# Patient Record
Sex: Female | Born: 1945 | Hispanic: Refuse to answer | Smoking: Never smoker
Health system: Southern US, Community
[De-identification: ages and names within clinical notes are randomized; demographics above are authoritative.]

## PROBLEM LIST (undated history)

## (undated) DIAGNOSIS — I10 Essential (primary) hypertension: Secondary | ICD-10-CM

## (undated) DIAGNOSIS — E559 Vitamin D deficiency, unspecified: Secondary | ICD-10-CM

## (undated) DIAGNOSIS — J9610 Chronic respiratory failure, unspecified whether with hypoxia or hypercapnia: Secondary | ICD-10-CM

## (undated) DIAGNOSIS — G35D Multiple sclerosis, unspecified: Secondary | ICD-10-CM

## (undated) DIAGNOSIS — K219 Gastro-esophageal reflux disease without esophagitis: Secondary | ICD-10-CM

## (undated) DIAGNOSIS — N182 Chronic kidney disease, stage 2 (mild): Secondary | ICD-10-CM

## (undated) DIAGNOSIS — E119 Type 2 diabetes mellitus without complications: Secondary | ICD-10-CM

## (undated) DIAGNOSIS — R1312 Dysphagia, oropharyngeal phase: Secondary | ICD-10-CM

## (undated) DIAGNOSIS — M8580 Other specified disorders of bone density and structure, unspecified site: Secondary | ICD-10-CM

## (undated) DIAGNOSIS — E785 Hyperlipidemia, unspecified: Secondary | ICD-10-CM

## (undated) DIAGNOSIS — G8929 Other chronic pain: Secondary | ICD-10-CM

## (undated) DIAGNOSIS — Z8719 Personal history of other diseases of the digestive system: Secondary | ICD-10-CM

## (undated) DIAGNOSIS — K760 Fatty (change of) liver, not elsewhere classified: Secondary | ICD-10-CM

## (undated) DIAGNOSIS — G8320 Monoplegia of upper limb affecting unspecified side: Secondary | ICD-10-CM

## (undated) DIAGNOSIS — E210 Primary hyperparathyroidism: Secondary | ICD-10-CM

## (undated) DIAGNOSIS — E669 Obesity, unspecified: Secondary | ICD-10-CM

## (undated) DIAGNOSIS — G35 Multiple sclerosis: Secondary | ICD-10-CM

## (undated) HISTORY — DX: Other chronic pain: G89.29

## (undated) HISTORY — DX: Essential (primary) hypertension: I10

## (undated) HISTORY — DX: Multiple sclerosis, unspecified: G35.D

## (undated) HISTORY — PX: ANTERIOR CRUCIATE LIGAMENT REPAIR: SHX115

## (undated) HISTORY — DX: Personal history of other diseases of the digestive system: Z87.19

## (undated) HISTORY — DX: Hyperlipidemia, unspecified: E78.5

## (undated) HISTORY — DX: Type 2 diabetes mellitus without complications: E11.9

## (undated) HISTORY — DX: Chronic kidney disease, stage 2 (mild): N18.2

## (undated) HISTORY — DX: Fatty (change of) liver, not elsewhere classified: K76.0

## (undated) HISTORY — DX: Vitamin D deficiency, unspecified: E55.9

## (undated) HISTORY — DX: Primary hyperparathyroidism: E21.0

## (undated) HISTORY — DX: Multiple sclerosis: G35

## (undated) HISTORY — DX: Other specified disorders of bone density and structure, unspecified site: M85.80

## (undated) HISTORY — DX: Gastro-esophageal reflux disease without esophagitis: K21.9

## (undated) HISTORY — DX: Obesity, unspecified: E66.9

## (undated) HISTORY — PX: VESICOVAGINAL FISTULA CLOSURE W/ TAH: SUR271

---

## 1999-11-08 ENCOUNTER — Other Ambulatory Visit: Admission: RE | Admit: 1999-11-08 | Discharge: 1999-11-08 | Payer: Self-pay | Admitting: Obstetrics and Gynecology

## 1999-11-22 ENCOUNTER — Encounter: Payer: Self-pay | Admitting: Obstetrics and Gynecology

## 1999-11-22 ENCOUNTER — Encounter: Admission: RE | Admit: 1999-11-22 | Discharge: 1999-11-22 | Payer: Self-pay | Admitting: Obstetrics and Gynecology

## 2000-02-16 ENCOUNTER — Ambulatory Visit (HOSPITAL_BASED_OUTPATIENT_CLINIC_OR_DEPARTMENT_OTHER): Admission: RE | Admit: 2000-02-16 | Discharge: 2000-02-17 | Payer: Self-pay | Admitting: Orthopedic Surgery

## 2000-11-08 ENCOUNTER — Other Ambulatory Visit: Admission: RE | Admit: 2000-11-08 | Discharge: 2000-11-08 | Payer: Self-pay | Admitting: Obstetrics and Gynecology

## 2000-11-24 ENCOUNTER — Encounter: Admission: RE | Admit: 2000-11-24 | Discharge: 2000-11-24 | Payer: Self-pay | Admitting: Obstetrics and Gynecology

## 2000-11-24 ENCOUNTER — Encounter: Payer: Self-pay | Admitting: Obstetrics and Gynecology

## 2001-11-09 ENCOUNTER — Other Ambulatory Visit: Admission: RE | Admit: 2001-11-09 | Discharge: 2001-11-09 | Payer: Self-pay | Admitting: Obstetrics and Gynecology

## 2001-11-26 ENCOUNTER — Encounter: Admission: RE | Admit: 2001-11-26 | Discharge: 2001-11-26 | Payer: Self-pay | Admitting: Obstetrics and Gynecology

## 2001-11-26 ENCOUNTER — Encounter: Payer: Self-pay | Admitting: Obstetrics and Gynecology

## 2002-12-13 ENCOUNTER — Encounter: Admission: RE | Admit: 2002-12-13 | Discharge: 2002-12-13 | Payer: Self-pay | Admitting: Obstetrics and Gynecology

## 2002-12-13 ENCOUNTER — Encounter: Payer: Self-pay | Admitting: Obstetrics and Gynecology

## 2003-04-17 ENCOUNTER — Ambulatory Visit (HOSPITAL_COMMUNITY): Admission: RE | Admit: 2003-04-17 | Discharge: 2003-04-17 | Payer: Self-pay | Admitting: *Deleted

## 2006-10-13 ENCOUNTER — Encounter: Admission: RE | Admit: 2006-10-13 | Discharge: 2006-10-13 | Payer: Self-pay | Admitting: Obstetrics and Gynecology

## 2007-12-20 ENCOUNTER — Encounter: Payer: Self-pay | Admitting: Endocrinology

## 2007-12-31 ENCOUNTER — Encounter: Payer: Self-pay | Admitting: Endocrinology

## 2008-01-31 DIAGNOSIS — I10 Essential (primary) hypertension: Secondary | ICD-10-CM

## 2008-01-31 DIAGNOSIS — H469 Unspecified optic neuritis: Secondary | ICD-10-CM

## 2008-01-31 DIAGNOSIS — E785 Hyperlipidemia, unspecified: Secondary | ICD-10-CM

## 2008-01-31 DIAGNOSIS — G35D Multiple sclerosis, unspecified: Secondary | ICD-10-CM | POA: Insufficient documentation

## 2008-01-31 DIAGNOSIS — E11649 Type 2 diabetes mellitus with hypoglycemia without coma: Secondary | ICD-10-CM | POA: Insufficient documentation

## 2008-01-31 DIAGNOSIS — E1140 Type 2 diabetes mellitus with diabetic neuropathy, unspecified: Secondary | ICD-10-CM

## 2008-01-31 DIAGNOSIS — K219 Gastro-esophageal reflux disease without esophagitis: Secondary | ICD-10-CM

## 2008-01-31 DIAGNOSIS — G35 Multiple sclerosis: Secondary | ICD-10-CM | POA: Insufficient documentation

## 2008-02-01 ENCOUNTER — Ambulatory Visit: Payer: Self-pay | Admitting: Endocrinology

## 2008-02-01 DIAGNOSIS — E210 Primary hyperparathyroidism: Secondary | ICD-10-CM

## 2008-02-01 DIAGNOSIS — M810 Age-related osteoporosis without current pathological fracture: Secondary | ICD-10-CM | POA: Insufficient documentation

## 2008-11-24 ENCOUNTER — Encounter: Payer: Self-pay | Admitting: Endocrinology

## 2008-12-16 ENCOUNTER — Telehealth (INDEPENDENT_AMBULATORY_CARE_PROVIDER_SITE_OTHER): Payer: Self-pay | Admitting: *Deleted

## 2009-10-07 ENCOUNTER — Ambulatory Visit (HOSPITAL_COMMUNITY): Admission: RE | Admit: 2009-10-07 | Discharge: 2009-10-07 | Payer: Self-pay | Admitting: Internal Medicine

## 2009-11-23 ENCOUNTER — Ambulatory Visit (HOSPITAL_COMMUNITY): Admission: RE | Admit: 2009-11-23 | Discharge: 2009-11-23 | Payer: Self-pay | Admitting: Surgery

## 2010-12-23 LAB — DIFFERENTIAL
Basophils Absolute: 0.1 10*3/uL (ref 0.0–0.1)
Basophils Relative: 1 % (ref 0–1)
Lymphocytes Relative: 23 % (ref 12–46)
Neutro Abs: 6.9 10*3/uL (ref 1.7–7.7)
Neutrophils Relative %: 72 % (ref 43–77)

## 2010-12-23 LAB — CBC
MCHC: 34.6 g/dL (ref 30.0–36.0)
RBC: 4.89 MIL/uL (ref 3.87–5.11)
RDW: 12.6 % (ref 11.5–15.5)

## 2010-12-23 LAB — PROTIME-INR: INR: 0.94 (ref 0.00–1.49)

## 2010-12-23 LAB — URINALYSIS, ROUTINE W REFLEX MICROSCOPIC
Hgb urine dipstick: NEGATIVE
Nitrite: NEGATIVE
Protein, ur: NEGATIVE mg/dL
Specific Gravity, Urine: 1.013 (ref 1.005–1.030)
Urobilinogen, UA: 0.2 mg/dL (ref 0.0–1.0)

## 2010-12-23 LAB — BASIC METABOLIC PANEL
CO2: 29 mEq/L (ref 19–32)
Calcium: 12.1 mg/dL — ABNORMAL HIGH (ref 8.4–10.5)
Creatinine, Ser: 0.83 mg/dL (ref 0.4–1.2)
GFR calc Af Amer: >60 mL/min (ref >60)
GFR calc non Af Amer: >60 mL/min (ref >60)
Glucose, Bld: 196 mg/dL — ABNORMAL HIGH (ref 70–99)

## 2010-12-23 LAB — GLUCOSE, CAPILLARY

## 2011-02-18 NOTE — Op Note (Signed)
[place]  Patient:    Janice Little, Janice Little                   MRN: [id number] Proc. Date: 02/16/00 Adm. Date:  [id number] Disc. Date: [id number] Attending:  Milly Jakob                           Operative Report  PREOPERATIVE DIAGNOSIS:  Status post anterior cruciate ligament and medial collateral ligament injury with questionable meniscal tears, medial and lateral.  POSTOPERATIVE DIAGNOSIS:  Status post anterior cruciate ligament and medial collateral ligament injury with questionable meniscal tears, medial and lateral.  PRINCIPAL PROCEDURE: 1. Anterior cruciate ligament reconstruction with allograft tendon. 2. Removal of loose body. 3. Treatment of posterolateral meniscal tear without resection.  SURGEON:  Harvie Junior, M.D.  ASSISTANT:  Kerby Less, P.A.  ANESTHESIA:  General.  BRIEF HISTORY:  The patient is a 65 year old female with a long history of having had an ACL and MCL injury.  It was unclear to her after much more discussion as to whether the ACL possibly was an old injury.  She was ultimately treated in a range of motion brace for a month and we discussed at length nonoperative care for this injury.  In fact, had started her on quadriceps strengthening exercises as part of a nonoperative program. Ultimately, the patient decided that she did want to have her ACL reconstruction.  Again, we discussed the issues with ACL reconstruction, given that she did not have any ambitions as getting back into athletics or other aggressive activity.  She was feeling that she was unstable as she was walking around in her T-round brace, and because of this, she did want to have her reconstruction.  We talked about the possibilities of allograft autograft.  I did not think that hamstring was a good option for her, given that she had a medial side injury.  She ultimately selected on an allograft which I think was an appropriate choice  given her age and activity demands, and she was brought to the operating room for this procedure.  A preoperative MRI showed that she had the ACL tear, as well as concerned there were both medial and lateral meniscal tear.  She was taken to the operating room at this point.  DESCRIPTION OF PROCEDURE:  The patient was taken to the operating room where after adequate anesthesia was obtained with general anesthetic, the patient was placed supine on the operating table.  The left leg was then prepped and draped in the usual sterile fashion.  Following this, routine arthroscopic examination of the knee revealed that there was mild under surface tear on the medial meniscus.  It was certainly stable.  Did not feel that it appeared to want to pull out into the knee joint at all, and there was no significant osteochondral injury on the end of this femoral side.  Given the stable nature of the meniscus and the good appearance of the femoral condyle, it was felt that benign treatment of this was the most appropriate, and this was undertaken.  Following this, attention was turned to the midportion where a large ostial cartilaginous loose body was identified.  This was removed with a straight biting forceps.  Following this, attention was turned laterally where there was noted to be about a centimeter-and-a-half lateral meniscal tear that extended obviously to the superior and inferior surfaces.  Probing of this did  show that it could be distracted, but not out all the way underneath the condyle.  A large amount of thought was given to the most appropriate course of action here.  The lateral femoral condyle was without abnormality.  There was no chondromalacia.  It was felt at this point that the most appropriate course of action would be to stabilize the knee, and hopefully this would allow the peripheral lateral meniscal tear to heal, and this was undertaken.  At this point, the area was  probed.  It was minimally debrided with a suction shaver and then the meniscal synovial was abraded above it, and then attention was turned to the notch.  A dramatic amount of stenosis had occurred in the notch and it was clear at this point that this was a chronic ACL injury, not an acute, and a large notchplasty needed to be performed.  This was performed with the motorized bur and a suction shaver, allowing excellent lateral clearance and superior clearance.  Following this, ______ notchplasty, the Arthrex tibial guide was advanced into the side and the guide pin was used to enter into the knee.  A 10 mm reamer was then over-reamed, one through the subcutaneous tissue and taken down to level of bone.  Once the 10 mm reamer was used, a rasp was used to smooth the posterior surface, and then a 9 mm reamer was put in position.  The knee was brought through a full range of motion with the reamer up in the position of where the graft would go, and no evidence of impingement superiorly or laterally.  Attention was then turned to the posterior femoral condyle where a 6.5 mm over the top guide was used and the Beath needle was advanced through the knee out the distal lateral femur.  Following this, a footprint was made.  There was a good back wall and then the tunnel was drilled to 30 mm.  A 25 mm bone plug was noted to be in position superiorly.  Following this, a 7 x 25 mm screw was advanced into place with a protective guide and then the graft was tugged as much as possible, and no evidence of instability was noticed.  The ACL worked very nicely around the PCL at this point.  Attention was now turned towards locking the graft and distally, it was a perfect length distally, right at the edge of the tunnel, and an 8 x 20 mm interference screw was put in distally to lock the graft in place.  The graft was probed and had excellent tightness in full extension and excellent feel throughout  the range of motion.  Prior to locking it in distally, the knee was put through a range of motion and the graft was isometric.   At this point, the knee was copiously irrigated and suctioned dry.  A final check was made both medially and laterally, and all excess bony remnants were removed.  No other loose bodies were identified, and at this point, the tourniquet was let down.  The small opening for the graft was closed with a combination of 0 and 2-0 Vicryl, and the skin with a 3-0 Prolene suture.  A sterile and compressive dressing was applied after Steri-Strips had been applied to the superior opening, and the patient was then taken to the recovery room where she was noted to be satisfactory condition.  Estimated blood loss for the procedure was none. DD:  02/16/00 TD:  02/21/00 Job: [id number]

## 2014-04-18 ENCOUNTER — Encounter: Payer: Self-pay | Admitting: *Deleted

## 2014-04-18 ENCOUNTER — Other Ambulatory Visit: Payer: Self-pay | Admitting: Family Medicine

## 2014-04-18 DIAGNOSIS — R7989 Other specified abnormal findings of blood chemistry: Secondary | ICD-10-CM

## 2014-04-18 DIAGNOSIS — R945 Abnormal results of liver function studies: Principal | ICD-10-CM

## 2014-04-25 ENCOUNTER — Other Ambulatory Visit: Payer: Self-pay

## 2014-04-30 ENCOUNTER — Ambulatory Visit
Admission: RE | Admit: 2014-04-30 | Discharge: 2014-04-30 | Disposition: A | Discharge status: Home / Self Care | Payer: Medicare Other | Source: Ambulatory Visit | Attending: Family Medicine | Admitting: Family Medicine

## 2014-04-30 DIAGNOSIS — R945 Abnormal results of liver function studies: Principal | ICD-10-CM

## 2014-04-30 DIAGNOSIS — R7989 Other specified abnormal findings of blood chemistry: Secondary | ICD-10-CM

## 2014-05-19 ENCOUNTER — Other Ambulatory Visit: Payer: Self-pay | Admitting: Gastroenterology

## 2014-05-19 DIAGNOSIS — R748 Abnormal levels of other serum enzymes: Secondary | ICD-10-CM

## 2014-05-24 ENCOUNTER — Other Ambulatory Visit: Payer: Medicare Other

## 2014-05-28 ENCOUNTER — Ambulatory Visit
Admission: RE | Admit: 2014-05-28 | Discharge: 2014-05-28 | Disposition: A | Discharge status: Home / Self Care | Payer: Medicare Other | Source: Ambulatory Visit | Attending: Gastroenterology | Admitting: Gastroenterology

## 2014-05-28 DIAGNOSIS — R748 Abnormal levels of other serum enzymes: Secondary | ICD-10-CM

## 2014-05-28 MED ORDER — GADOBENATE DIMEGLUMINE 529 MG/ML IV SOLN
20.0000 mL | Freq: Once | INTRAVENOUS | Status: AC | PRN
  Administered 2014-05-28: 20 mL via INTRAVENOUS

## 2015-04-02 ENCOUNTER — Ambulatory Visit (INDEPENDENT_AMBULATORY_CARE_PROVIDER_SITE_OTHER): Payer: Medicare Other | Admitting: Neurology

## 2015-04-02 ENCOUNTER — Encounter: Payer: Self-pay | Admitting: Neurology

## 2015-04-02 ENCOUNTER — Other Ambulatory Visit: Payer: Self-pay | Admitting: Neurology

## 2015-04-02 VITALS — BP 156/72 | HR 76 | Resp 16 | Ht 65.0 in | Wt 197.2 lb

## 2015-04-02 DIAGNOSIS — R200 Anesthesia of skin: Secondary | ICD-10-CM | POA: Diagnosis not present

## 2015-04-02 DIAGNOSIS — G5711 Meralgia paresthetica, right lower limb: Secondary | ICD-10-CM

## 2015-04-02 DIAGNOSIS — N39498 Other specified urinary incontinence: Secondary | ICD-10-CM | POA: Diagnosis not present

## 2015-04-02 DIAGNOSIS — G250 Essential tremor: Secondary | ICD-10-CM | POA: Diagnosis not present

## 2015-04-02 DIAGNOSIS — Z8669 Personal history of other diseases of the nervous system and sense organs: Secondary | ICD-10-CM | POA: Diagnosis not present

## 2015-04-02 DIAGNOSIS — R5383 Other fatigue: Secondary | ICD-10-CM | POA: Insufficient documentation

## 2015-04-02 DIAGNOSIS — H532 Diplopia: Secondary | ICD-10-CM

## 2015-04-02 DIAGNOSIS — R260 Ataxic gait: Secondary | ICD-10-CM | POA: Diagnosis not present

## 2015-04-02 DIAGNOSIS — R32 Unspecified urinary incontinence: Secondary | ICD-10-CM | POA: Insufficient documentation

## 2015-04-02 MED ORDER — TAMSULOSIN HCL 0.4 MG PO CAPS: 0.4000 mg | ORAL_CAPSULE | Freq: Every day | ORAL | Status: DC

## 2015-04-02 NOTE — Progress Notes (Signed)
[facility]  PATIENT: Janice Little DOB: 09-12-1946  REFERRING DOCTOR OR PCP:  Juluis Rainier  SOURCE: Patient and records from Dr. Zachery Dauer.  _________________________________   HISTORICAL  CHIEF COMPLAINT:  Chief Complaint  Patient presents with  . Multiple Sclerosis      Sts. she was dx. with MS in 1986, by her pcp at the time, Dr. Alleen Borne.  Sts. presenting sx. was optic neuritis (right eye).  Sts. she was referred to Dr. Sandria Manly here at [facility] and he rx'd oral steroids.  Sts. Dr. Sandria Manly told her he was not sure she had MS.  Sts. she had mult. mri's but they never showed any lesions.  After the initial optic neuritis resolved, she sts. she was ok until 1994, when she presented with generalized numbness/pain/fatigue,  Sts. she again saw Dr. Sandria Manly and was dx. with   . Numbness    transverse myelitis.  Sts. Dr. Sandria Manly did not rx. any tx. for transverse myelitis, and she just continued with 6 mo. follow ups and occasional mri.  Sts. she has continued to have intermiitent numbness to mid-lower back.  She has new c/o numbness/pain right thigh, onset 4 mos. ago. Occasional pain left foot radiating up left leg to knee./fim    HISTORY OF PRESENT ILLNESS:  I had the pleasure seeing your patient, Janice Little, at [facility] for neurologic consultation regarding her multiple sclerosis.  MS History:  In 1986, she presented with right optic neuritis and was referred to Dr. love. Place her on oral steroids. She had an MRI of the brain that was normal at that time. 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 However, the MRI did not show any definite changes. Imaging studies were probably about 14 years ago and she last saw Dr. love around 2002.  Numbness:   About 4 months ago, she had the onset of numbness in the right anterolateral thigh. She week the numbness became more painful.  She notes the pain in the skin only and not deeper. There is often an itching sensation there or a sensation of something moving symptoms are now fairly stable and she feels the same now as one month ago.   She notes losing about 10 pounds this year.     Gait: She has had a fluctuating mild gait disturbance. Earlier this week she had some decreased balance and left leg pain. She uses a cane for day. Today she feels back to her baseline.  Bladder/bowel: Around 2000 she had the onset of urinary incontinence and is incontinent on a frequent basis. She notes that she often has trouble initiating her urine flow she has had urinary tract infections but none in the past couple of years. She notes much more rare bowel incontinence.   Vision: She felt that her vision got 100% better after her initial optic neuritis. 3 years ago, she began to have diplopia with convergence and prism reading glasses were made for her with benefit.  Fatigue/sleep: She notes fatigue most of the time. She will occasionally have periods of time lasting couple days to a week or so where she has less fatigue but these are not common. She feels that she falls asleep and stays asleep well. She does not know if she snores but she has never been told that she does nor has she been told that she has pauses or gasping at night.   However, she  does snore if she has a upper respiratory infection.  Mood/cognition: She denies any depression or anxiety. Back in 2011, she noted some cognitive fog but that improved after she was diagnosis with hyperparathyroidism and had a parathyroidectomy. Calcium levels had been elevated at the time.  Tremor:  She notes a low amplitude fast tremor in the right hand that occurs mostly when she is holding a cup of coffee.  REVIEW OF SYSTEMS: Constitutional: No fevers, chills, sweats, or change in appetite.   Notes fatigue Eyes: No   eye pain   See above. Ear, nose and throat: No hearing loss, ear pain, nasal  congestion, sore throat Cardiovascular: No chest pain, palpitations Respiratory: No shortness of breath at rest or with exertion.   No wheezes GastrointestinaI: No nausea, vomiting, diarrhea, abdominal pain, fecal incontinence Genitourinary: as above Musculoskeletal: No neck pain, back pain Integumentary: No rash, pruritus, skin lesions Neurological: as above Psychiatric: No depression at this time.  No anxiety Endocrine: No palpitations, diaphoresis, change in appetite, change in weigh or increased thirst Hematologic/Lymphatic: No anemia, purpura, petechiae. Allergic/Immunologic: No itchy/runny eyes, nasal congestion, recent allergic reactions, rashes  ALLERGIES: No Known Allergies  HOME MEDICATIONS:  Current outpatient prescriptions:  .  amLODipine (NORVASC) 10 MG tablet, Take 10 mg by mouth daily., Disp: , Rfl:  .  atorvastatin (LIPITOR) 20 MG tablet, Take 20 mg by mouth daily., Disp: , Rfl:  .  Cholecalciferol (VITAMIN D PO), Take 5,000 Units by mouth daily., Disp: , Rfl:  .  estradiol (ESTRACE) 0.5 MG tablet, Take 0.5 mg by mouth daily., Disp: , Rfl:  .  loratadine (CLARITIN) 10 MG tablet, Take 10 mg by mouth daily., Disp: , Rfl:  .  metFORMIN (GLUMETZA) 500 MG (MOD) 24 hr tablet, Take 500 mg by mouth 2 (two) times daily with a meal., Disp: , Rfl:  .  Multiple Vitamin (MULTIVITAMIN) capsule, Take 1 capsule by mouth daily., Disp: , Rfl:  .  omeprazole (PRILOSEC OTC) 20 MG tablet, Take 20 mg by mouth daily., Disp: , Rfl:  .  ergocalciferol (VITAMIN D2) 50000 UNITS capsule, Take 50,000 Units by mouth once a week., Disp: , Rfl:  .  JANUVIA 50 MG tablet, , Disp: , Rfl:   PAST MEDICAL HISTORY: Past Medical History  Diagnosis Date  . GERD (gastroesophageal reflux disease)   . DM type 2 (diabetes mellitus, type 2)   . HTN (hypertension)   . Obesity   . Hyperlipidemia   . Vitamin D deficiency   . Chronic pain   . History of hepatitis as a child     age 71 (jaundice)  . MS  (multiple sclerosis)   . Fatty liver     per CT  . Primary hyperparathyroidism     s/p surgery removal of parathyroid (Dr. Sharl Ma, Dr. Gerrit Friends)  . Osteopenia   . Chronic kidney disease (CKD), stage II (mild)     PAST SURGICAL HISTORY: Past Surgical History  Procedure Laterality Date  . Vesicovaginal fistula closure w/ tah    . Anterior cruciate ligament repair Left     FAMILY HISTORY: Family History  Problem Relation Age of Onset  . Mental illness Mother   . COPD Father   . Lung cancer Father     SOCIAL HISTORY:  History   Social History  . Marital Status: Legally Separated    Spouse Name: N/A  . Number of Children: N/A  . Years of Education: N/A   Occupational History  . Not on  file.   Social History Main Topics  . Smoking status: Never Smoker   . Smokeless tobacco: Not on file  . Alcohol Use: Not on file  . Drug Use: Not on file  . Sexual Activity: Not on file   Other Topics Concern  . Not on file   Social History Narrative     PHYSICAL EXAM  Filed Vitals:   04/02/15 1402  BP: 156/72  Pulse: 76  Resp: 16  Height: 5\' 5"  (1.651 m)  Weight: 197 lb 3.2 oz (89.449 kg)    Body mass index is 32.82 kg/(m^2).   General: The patient is well-developed and well-nourished and in no acute distress  Eyes:  Funduscopic exam shows normal optic discs and retinal vessels.  Neck: The neck is supple, no carotid bruits are noted.  The neck is nontender.  Cardiovascular: The heart has a regular rate and rhythm with a normal S1 and S2. There were no murmurs, gallops or rubs. Lungs are clear to auscultation.  Skin: Extremities are without significant edema.  Musculoskeletal:  Back is nontender  Neurologic Exam  Mental status: The patient is alert and oriented x 3 at the time of the examination. The patient has apparent normal recent and remote memory, with an apparently normal attention span and concentration ability.   Speech is normal.  Cranial nerves:  Extraocular movements are full. Pupils are equal, round, and reactive to light and accomodation.  Color vision symmetric.   Facial symmetry is present. There is good facial sensation to soft touch bilaterally.Facial strength is normal.  Trapezius and sternocleidomastoid strength is normal. No dysarthria is noted.  The tongue is midline, and the patient has symmetric elevation of the soft palate. No obvious hearing deficits are noted.  Motor:  Muscle bulk is normal.   Tone is normal. Strength is  5 / 5 in all 4 extremities.   Sensory: Sensory testing is intact to pinprick, soft touch and vibration sensation in arms and feet.   Decreased sensation over the left lateral femoral cutaneous nerve.  Coordination: Cerebellar testing reveals good finger-nose-finger and heel-to-shin bilaterally.  Gait and station: Station is normal.   Gait is mildly wide. Tandem gait is wide . Romberg is negative.   Reflexes: Deep tendon reflexes are symmetric and normal bilaterally.   Plantar responses are flexor.    DIAGNOSTIC DATA (LABS, IMAGING, TESTING) - I reviewed patient records, labs, notes, testing and imaging myself where available.      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 The diagnosis of MS was never completely clear in the  past as her symptoms history of optic neuritis were certainly consistent with the diagnosis but MRIs were normal eye believe the right leg numbness is due to lateral femoral cutaneous neuropathy and not  to a central issue. For, I am very concerned about some of her other symptoms including her gait ataxia and urinary incontinence. Therefore, we need to obtain an MRI of the brain and MRI of the cervical spine to assess for the possibility of multiple sclerosis and also to make sure that there is not another etiology of a myelopathy. Her urinary incontinence seems to be due to more to overflow issues rather than to a spastic bladder. Therefore, I will have her try Flomax to see if that can help. She will return to see me in 3 months or sooner if she has new or worsening neurologic symptoms. We will call with the results of the MRI a few days after it is performed.     Richard A. Epimenio Foot, MD, PhD 04/02/2015, 2:14 PM Certified in Neurology, Clinical Neurophysiology, Sleep Medicine, Pain Medicine and Neuroimaging  [facility] [address] [place], [place] [address] [phone]

## 2015-04-17 ENCOUNTER — Ambulatory Visit
Admission: RE | Admit: 2015-04-17 | Discharge: 2015-04-17 | Disposition: A | Discharge status: Home / Self Care | Payer: Medicare Other | Source: Ambulatory Visit | Attending: Neurology | Admitting: Neurology

## 2015-04-17 DIAGNOSIS — G35 Multiple sclerosis: Secondary | ICD-10-CM | POA: Diagnosis not present

## 2015-04-17 DIAGNOSIS — Z8669 Personal history of other diseases of the nervous system and sense organs: Secondary | ICD-10-CM

## 2015-04-17 DIAGNOSIS — N39498 Other specified urinary incontinence: Secondary | ICD-10-CM

## 2015-04-17 DIAGNOSIS — R260 Ataxic gait: Secondary | ICD-10-CM

## 2015-04-17 DIAGNOSIS — R200 Anesthesia of skin: Secondary | ICD-10-CM

## 2015-04-17 DIAGNOSIS — H532 Diplopia: Secondary | ICD-10-CM

## 2015-04-17 MED ORDER — GADOBENATE DIMEGLUMINE 529 MG/ML IV SOLN
18.0000 mL | Freq: Once | INTRAVENOUS | Status: AC | PRN
  Administered 2015-04-17: 18 mL via INTRAVENOUS

## 2015-04-23 ENCOUNTER — Telehealth: Payer: Self-pay | Admitting: *Deleted

## 2015-04-23 NOTE — Telephone Encounter (Signed)
-----   Message from Asa Lente, MD sent at 04/22/2015  6:02 PM EDT ----- Please let her know the MRI of the brain does not show anything that looks new.     The old changes are non-specific and more likely to be ischemic than MS --  If not already doing so, would recommend a baby aspirin daily.   The spinal cord was normal

## 2015-04-23 NOTE — Telephone Encounter (Signed)
I have spoken with Janice Little this morning and per RAS, advised that mri brain does not show any new changes, and that the old changes are non-specific, likely to be ischemic rather than from MS.  I have advised she start daily baby asa.  She verbalized understanding of same.  F/U appt. made/fim

## 2015-06-25 ENCOUNTER — Ambulatory Visit (INDEPENDENT_AMBULATORY_CARE_PROVIDER_SITE_OTHER): Payer: Medicare Other | Admitting: Neurology

## 2015-06-25 ENCOUNTER — Encounter: Payer: Self-pay | Admitting: Neurology

## 2015-06-25 VITALS — BP 130/82 | HR 76 | Resp 14 | Ht 65.0 in | Wt 199.8 lb

## 2015-06-25 DIAGNOSIS — G35 Multiple sclerosis: Secondary | ICD-10-CM | POA: Diagnosis not present

## 2015-06-25 DIAGNOSIS — H469 Unspecified optic neuritis: Secondary | ICD-10-CM

## 2015-06-25 DIAGNOSIS — R200 Anesthesia of skin: Secondary | ICD-10-CM

## 2015-06-25 DIAGNOSIS — N39498 Other specified urinary incontinence: Secondary | ICD-10-CM | POA: Diagnosis not present

## 2015-06-25 DIAGNOSIS — R260 Ataxic gait: Secondary | ICD-10-CM

## 2015-06-25 NOTE — Progress Notes (Signed)
[facility]  PATIENT: Janice Little DOB: April 05, 1946  REFERRING DOCTOR OR PCP:  Juluis Rainier  SOURCE: Patient and records from Dr. Zachery Dauer.  _________________________________   HISTORICAL  CHIEF COMPLAINT:  Chief Complaint  Patient presents with  . History of Optic Neuritis    Denies any visual disturbance since last ov.  She f/u with an opthalmologist yearly.  Sts. numbness in her lateral right thigh is about the same.  Flomax was added at last ov for incontinence, and she sts. it helped but she was not able to tolerate it due to side effects. (sts. it affected her judegment and ability to stand still.)  MRI brain/c-spine done since last ov showed no new changes.Chucky May  . Numbness  . Diplopia  . Urinary Incontinence    HISTORY OF PRESENT ILLNESS:  I had the pleasure seeing your patient, Janice Little, at [facility] for neurologic consultation regarding her multiple sclerosis.   MRI's in July 2016 showed no spinal cord plaques.   Brain shows white matter changes that are more likely to due to chronic ischemic .  She was placed on aspirin 81 mg after the MRI and she tolerates it well.    MS History:  In 1986, she presented with right optic neuritis and was referred to Dr. Sandria Manly who placed her on oral steroids. She had an MRI of the brain that was normal at that time. 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 However, the MRI did not show any definite changes. Imaging studies were probably about 14 years ago and she last saw Dr. Sandria Manly around 2002.  Gait: She continues to have fluctuations in her gait.   She did better over teh weekend but feels it is decreased again.     She tries to get by without a cane as she often is carrying something and using a cane is difficult.     She is asking about a handicap placard.   Strength is fine.     Numbness:   The right  anterolateral thigh is still numb and  At timespainful.   Marland Kitchen She week the numbness became more painful. She notes the pain in the skin only and not deeper. There is often an itching sensation there or a sensation of something moving symptoms are now fairly stable and she feels the same now as one month ago.   She notes losing about 10 pounds this year and she is diabetic.     Bladder/bowel: At the last visit, she was prescribed tamsulosin but felt cognitively off and lightheaded.   Incontinence started in 2000 and she is incontinent on a frequent basis. She was having hesitancy and that improved on tamsulosin but the incontinence did not improve. No urinary tract infections in the past couple of years. She notes much more rare bowel incontinence.   Vision: Vision is baseline now.    She had optic neuritis 3 years ago and diplopia with convergence more recently.  Prism reading glasses were made for her with benefit.  Fatigue/sleep: She notes fatigue most of the time. She will occasionally have periods of time lasting couple days to a week or so where she has less fatigue but these are not common. She feels that she falls asleep and stays asleep well. She does not know if she snores but she has never been told that she does nor has she been told that she has  pauses or gasping at night.   However, she does snore if she has a upper respiratory infection.  Mood/cognition: She denies any depression or anxiety. She denies current cognitive problems but has felt in a fog at times. She fel better on Aspirin   REVIEW OF SYSTEMS: Constitutional: No fevers, chills, sweats, or change in appetite.   Notes fatigue Eyes: No   eye pain   See above. Ear, nose and throat: No hearing loss, ear pain, nasal congestion, sore throat Cardiovascular: No chest pain, palpitations Respiratory: No shortness of breath at rest or with exertion.   No wheezes GastrointestinaI: No nausea, vomiting, diarrhea, abdominal pain, fecal  incontinence Genitourinary: as above Musculoskeletal: No neck pain, back pain Integumentary: No rash, pruritus, skin lesions Neurological: as above Psychiatric: No depression at this time.  No anxiety Endocrine: No palpitations, diaphoresis, change in appetite, change in weigh or increased thirst Hematologic/Lymphatic: No anemia, purpura, petechiae. Allergic/Immunologic: No itchy/runny eyes, nasal congestion, recent allergic reactions, rashes  ALLERGIES: No Known Allergies  HOME MEDICATIONS:  Current outpatient prescriptions:  .  amLODipine (NORVASC) 10 MG tablet, Take 10 mg by mouth daily., Disp: , Rfl:  .  atorvastatin (LIPITOR) 20 MG tablet, Take 20 mg by mouth daily., Disp: , Rfl:  .  Cholecalciferol (VITAMIN D PO), Take 5,000 Units by mouth daily., Disp: , Rfl:  .  estradiol (ESTRACE) 0.5 MG tablet, Take 0.5 mg by mouth daily., Disp: , Rfl:  .  JANUVIA 50 MG tablet, , Disp: , Rfl:  .  loratadine (CLARITIN) 10 MG tablet, Take 10 mg by mouth daily., Disp: , Rfl:  .  metFORMIN (GLUMETZA) 500 MG (MOD) 24 hr tablet, Take 500 mg by mouth 2 (two) times daily with a meal., Disp: , Rfl:  .  Multiple Vitamin (MULTIVITAMIN) capsule, Take 1 capsule by mouth daily., Disp: , Rfl:  .  omeprazole (PRILOSEC OTC) 20 MG tablet, Take 20 mg by mouth daily., Disp: , Rfl:   PAST MEDICAL HISTORY: Past Medical History  Diagnosis Date  . GERD (gastroesophageal reflux disease)   . DM type 2 (diabetes mellitus, type 2)   . HTN (hypertension)   . Obesity   . Hyperlipidemia   . Vitamin D deficiency   . Chronic pain   . History of hepatitis as a child     age 4 (jaundice)  . MS (multiple sclerosis)   . Fatty liver     per CT  . Primary hyperparathyroidism     s/p surgery removal of parathyroid (Dr. Sharl Ma, Dr. Gerrit Friends)  . Osteopenia   . Chronic kidney disease (CKD), stage II (mild)     PAST SURGICAL HISTORY: Past Surgical History  Procedure Laterality Date  . Vesicovaginal fistula closure w/  tah    . Anterior cruciate ligament repair Left     FAMILY HISTORY: Family History  Problem Relation Age of Onset  . Mental illness Mother   . COPD Father   . Lung cancer Father     SOCIAL HISTORY:  Social History   Social History  . Marital Status: Legally Separated    Spouse Name: N/A  . Number of Children: N/A  . Years of Education: N/A   Occupational History  . Not on file.   Social History Main Topics  . Smoking status: Never Smoker   . Smokeless tobacco: Not on file  . Alcohol Use: Not on file  . Drug Use: Not on file  . Sexual Activity: Not on file   Other  Topics Concern  . Not on file   Social History Narrative     PHYSICAL EXAM  Filed Vitals:   06/25/15 1404  BP: 130/82  Pulse: 76  Resp: 14  Height: 5\' 5"  (1.651 m)  Weight: 199 lb 12.8 oz (90.629 kg)    Body mass index is 33.25 kg/(m^2).   General: The patient is well-developed and well-nourished and in no acute distress  Eyes:  Funduscopic exam shows normal optic discs and retinal vessels.  Neck: The neck is supple, no carotid bruits are noted.  The neck is nontender.  Cardiovascular: The heart has a regular rate and rhythm with a normal S1 and S2. There were no murmurs, gallops or rubs. Lungs are clear to auscultation.  Skin: Extremities are without significant edema.  Musculoskeletal:  Back is nontender  Neurologic Exam  Mental status: The patient is alert and oriented x 3 at the time of the examination. The patient has apparent normal recent and remote memory, with an apparently normal attention span and concentration ability.   Speech is normal.  Cranial nerves: Extraocular movements are full. Pupils are equal, round, and reactive to light and accomodation.  Color vision symmetric.   Facial symmetry is present. There is good facial sensation to soft touch bilaterally.Facial strength is normal.  Trapezius and sternocleidomastoid strength is normal. No dysarthria is noted.  The tongue  is midline, and the patient has symmetric elevation of the soft palate. No obvious hearing deficits are noted.  Motor:  Muscle bulk is normal.   Tone is normal. Strength is  5 / 5 in all 4 extremities.   Sensory: Sensory testing is intact to pinprick, soft touch and vibration sensation in arms and feet.   Decreased sensation over the left lateral femoral cutaneous nerve.  Coordination: Cerebellar testing reveals good finger-nose-finger and heel-to-shin bilaterally.  Gait and station: Station is normal.   Gait is mildly wide. Tandem gait is wide . Romberg is negative.   Reflexes: Deep tendon reflexes are symmetric and normal bilaterally.   Plantar responses are flexor.    DIAGNOSTIC DATA (LABS, IMAGING, TESTING) - I reviewed patient records, labs, notes, testing and imaging myself where available.      ASSESSMENT AND PLAN  Multiple sclerosis  Optic neuritis  Ataxic gait  Numbness  Other urinary incontinence  1.   Continue aspirin 81 mg by mouth daily 2.  Continue vitamin D supplementation. 3.    Advised to remain active and exercise as tolerated.  4.    If urinary symptoms worsen, refer to urology.  Return to clinic in 4 months or sooner if there are new or worsening neurologic symptoms.     Richard A. Epimenio Foot, MD, PhD 06/25/2015, 2:11 PM Certified in Neurology, Clinical Neurophysiology, Sleep Medicine, Pain Medicine and Neuroimaging  [facility] [address], [address] [place], [place] [address] [phone]

## 2015-12-23 ENCOUNTER — Encounter: Payer: Self-pay | Admitting: Neurology

## 2015-12-23 ENCOUNTER — Ambulatory Visit (INDEPENDENT_AMBULATORY_CARE_PROVIDER_SITE_OTHER): Payer: Medicare Other | Admitting: Neurology

## 2015-12-23 VITALS — BP 172/80 | HR 68 | Resp 16 | Ht 65.0 in | Wt 197.4 lb

## 2015-12-23 DIAGNOSIS — G35 Multiple sclerosis: Secondary | ICD-10-CM | POA: Diagnosis not present

## 2015-12-23 DIAGNOSIS — Z8669 Personal history of other diseases of the nervous system and sense organs: Secondary | ICD-10-CM | POA: Diagnosis not present

## 2015-12-23 DIAGNOSIS — N39498 Other specified urinary incontinence: Secondary | ICD-10-CM

## 2015-12-23 DIAGNOSIS — R5383 Other fatigue: Secondary | ICD-10-CM

## 2015-12-23 DIAGNOSIS — R200 Anesthesia of skin: Secondary | ICD-10-CM

## 2015-12-23 DIAGNOSIS — R260 Ataxic gait: Secondary | ICD-10-CM | POA: Diagnosis not present

## 2015-12-23 DIAGNOSIS — G5711 Meralgia paresthetica, right lower limb: Secondary | ICD-10-CM

## 2015-12-23 NOTE — Progress Notes (Signed)
[facility]  PATIENT: Janice Little DOB: June 12, 1946  REFERRING DOCTOR OR PCP:  Juluis Rainier  SOURCE: Patient and records from Dr. Zachery Dauer.  _________________________________   HISTORICAL  CHIEF COMPLAINT:  Chief Complaint  Patient presents with  . Multiple Sclerosis    She is not on any MS therapy.  Sts. she continues daily aspirin and vit. d.  Sts. right thigh pain is less.  Numbness continues./fim    HISTORY OF PRESENT ILLNESS:  Janice Little is a 70 yo woman diagnosed with MS after an episoide of suspected transverse myelitis in 1994 who had had right ON 8 years earlier.      MRI's in July 2016 showed brain changes more consistent with chronic microvascular ischemic events than with MS. MRI of the cervical spine showed degenerative changes. no spinal cord plaques.   She was placed on aspirin 81 mg after the MRI and she tolerates it well.    Gait: She feels unsteady and uses a cane.    Strength is fine.     Numbness:   The right anterolateral thigh is still numb but less painful and less itching than last year.   Dysesthesias are in the skin only and not deeper. There is often an itching sensation there or a sensation of something moving symptoms are now fairly stable and she feels the same now as one month ago.   She notes losing about 10 pounds this year and she is diabetic.     Bladder/bowel: She has incontinence (bladder much more than bowel).   No urinary tract infections in the past couple of years.    She drinks cranberry juice to help prevent UTI.    In the past, she saw urology was placed on the medication without benefit.  Vision: Vision is baseline now.   She wears glasses.   ON in the past.    Fatigue/sleep: She notes fatigue and very poor physical stamina.   Also notes cognitive fatigue (better on aspirin).  She feels that she falls asleep and stays asleep well. She snores but does not have pauses or gasping at night.    .  Mood/cognition: She denies any depression or anxiety. She denies current cognitive problems but has felt in a fog at times. She fel better on Aspirin  MS/TN/ON History:  In 1986, she presented with right optic neuritis and was referred to Dr. Sandria Manly who placed her on oral steroids. She had an MRI of the brain that was normal at that time. 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 However, the MRI did not show any definite changes. Imaging studies were probably about 14 years ago and she last saw Dr. Sandria Manly around 2002.    REVIEW OF SYSTEMS: Constitutional: No fevers, chills, sweats, or change in appetite.   Notes fatigue Eyes: No   eye pain   See above. Ear, nose and throat: No hearing loss, ear pain, nasal congestion, sore throat Cardiovascular: No chest pain, palpitations Respiratory: No shortness of breath at rest or with exertion.   No wheezes GastrointestinaI: No nausea, vomiting, diarrhea, abdominal pain, fecal incontinence Genitourinary: as above Musculoskeletal: No neck pain, back pain Integumentary: No rash, pruritus, skin lesions Neurological: as above Psychiatric: No depression at this time.  No anxiety Endocrine: No palpitations, diaphoresis, change in appetite, change in weigh or increased thirst Hematologic/Lymphatic: No anemia, purpura, petechiae. Allergic/Immunologic: No itchy/runny eyes, nasal congestion, recent allergic reactions,  rashes  ALLERGIES: No Known Allergies  HOME MEDICATIONS:  Current outpatient prescriptions:  .  amLODipine (NORVASC) 10 MG tablet, Take 10 mg by mouth daily., Disp: , Rfl:  .  atorvastatin (LIPITOR) 20 MG tablet, Take 20 mg by mouth daily., Disp: , Rfl:  .  Cholecalciferol (VITAMIN D PO), Take 5,000 Units by mouth daily., Disp: , Rfl:  .  estradiol (ESTRACE) 0.5 MG tablet, Take 0.5 mg by mouth daily., Disp: , Rfl:  .  JANUVIA 50 MG tablet, , Disp: , Rfl:  .   metFORMIN (GLUMETZA) 500 MG (MOD) 24 hr tablet, Take 500 mg by mouth 2 (two) times daily with a meal., Disp: , Rfl:  .  Multiple Vitamin (MULTIVITAMIN) capsule, Take 1 capsule by mouth daily., Disp: , Rfl:  .  omeprazole (PRILOSEC OTC) 20 MG tablet, Take 20 mg by mouth daily., Disp: , Rfl:  .  loratadine (CLARITIN) 10 MG tablet, Take 10 mg by mouth daily. Reported on 12/23/2015, Disp: , Rfl:   PAST MEDICAL HISTORY: Past Medical History  Diagnosis Date  . GERD (gastroesophageal reflux disease)   . DM type 2 (diabetes mellitus, type 2) (HCC)   . HTN (hypertension)   . Obesity   . Hyperlipidemia   . Vitamin D deficiency   . Chronic pain   . History of hepatitis as a child     age 58 (jaundice)  . MS (multiple sclerosis) (HCC)   . Fatty liver     per CT  . Primary hyperparathyroidism (HCC)     s/p surgery removal of parathyroid (Dr. Sharl Ma, Dr. Gerrit Friends)  . Osteopenia   . Chronic kidney disease (CKD), stage II (mild)     PAST SURGICAL HISTORY: Past Surgical History  Procedure Laterality Date  . Vesicovaginal fistula closure w/ tah    . Anterior cruciate ligament repair Left     FAMILY HISTORY: Family History  Problem Relation Age of Onset  . Mental illness Mother   . COPD Father   . Lung cancer Father     SOCIAL HISTORY:  Social History   Social History  . Marital Status: Legally Separated    Spouse Name: N/A  . Number of Children: N/A  . Years of Education: N/A   Occupational History  . Not on file.   Social History Main Topics  . Smoking status: Never Smoker   . Smokeless tobacco: Not on file  . Alcohol Use: Not on file  . Drug Use: Not on file  . Sexual Activity: Not on file   Other Topics Concern  . Not on file   Social History Narrative     PHYSICAL EXAM  Filed Vitals:   12/23/15 1500  BP: 172/80  Pulse: 68  Resp: 16  Height:  (1.651 m)  Weight: 197 lb 6.4 oz (89.54 kg)    Body mass index is 32.85 kg/(m^2).   General: The patient is  well-developed and well-nourished and in no acute distress  Eyes:  Funduscopic exam shows normal optic discs and retinal vessels.  Neck: The neck is supple, no carotid bruits are noted.  The neck is nontender.  Cardiovascular: The heart has a regular rate and rhythm with a normal S1 and S2. There were no murmurs, gallops or rubs. Lungs are clear to auscultation.  Skin: Extremities are without significant edema.  Musculoskeletal:  Back is nontender  Neurologic Exam  Mental status: The patient is alert and oriented x 3 at the time of the examination. The  patient has apparent normal recent and remote memory, with an apparently normal attention span and concentration ability.   Speech is normal.  Cranial nerves: Extraocular movements are full. Pupils are equal, round, and reactive to light and accomodation.  Color vision symmetric.   Facial symmetry is present. There is good facial sensation to soft touch bilaterally.Facial strength is normal.  Trapezius and sternocleidomastoid strength is normal. No dysarthria is noted.  The tongue is midline, and the patient has symmetric elevation of the soft palate. No obvious hearing deficits are noted.  Motor:  Muscle bulk is normal.   Tone is normal. Strength is  5 / 5 in all 4 extremities.   Sensory: Sensory testing is intact to pinprick, soft touch and vibration sensation in arms and feet.   Decreased sensation over the left lateral femoral cutaneous nerve.  Coordination: Cerebellar testing reveals good finger-nose-finger and heel-to-shin bilaterally.  Gait and station: Station is normal.   Gait is mildly wide. Tandem gait is wide . Romberg is negative.   Reflexes: Deep tendon reflexes are symmetric and normal bilaterally.   Plantar responses are flexor.    DIAGNOSTIC DATA (LABS, IMAGING, TESTING) - I reviewed patient records, labs, notes, testing and imaging myself where available.      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   She has possible MS with h/o optic neuritis and transverse myelitis.   Imaging is non-specific.   Spine MRI shows no abnormaa spinal cord signal.   2.   continue aspirin 81 mg by mouth daily 3.    Advised to remain active and exercise as tolerated.  4.    return to clinic in 4 months or sooner if there are new or worsening neurologic symptoms.     Rosmary Dionisio A. Epimenio Foot, MD, PhD 12/23/2015, 3:15 PM Certified in Neurology, Clinical Neurophysiology, Sleep Medicine, Pain Medicine and Neuroimaging  [facility] [address] [place], [place] [address] [phone]

## 2016-12-22 ENCOUNTER — Encounter: Payer: Self-pay | Admitting: Neurology

## 2016-12-22 ENCOUNTER — Ambulatory Visit (INDEPENDENT_AMBULATORY_CARE_PROVIDER_SITE_OTHER): Payer: Medicare Other | Admitting: Neurology

## 2016-12-22 VITALS — BP 131/66 | HR 91 | Wt 195.0 lb

## 2016-12-22 DIAGNOSIS — N39498 Other specified urinary incontinence: Secondary | ICD-10-CM

## 2016-12-22 DIAGNOSIS — R5383 Other fatigue: Secondary | ICD-10-CM

## 2016-12-22 DIAGNOSIS — E1140 Type 2 diabetes mellitus with diabetic neuropathy, unspecified: Secondary | ICD-10-CM

## 2016-12-22 DIAGNOSIS — G35 Multiple sclerosis: Secondary | ICD-10-CM | POA: Diagnosis not present

## 2016-12-22 DIAGNOSIS — G5711 Meralgia paresthetica, right lower limb: Secondary | ICD-10-CM

## 2016-12-22 DIAGNOSIS — Z8669 Personal history of other diseases of the nervous system and sense organs: Secondary | ICD-10-CM | POA: Diagnosis not present

## 2016-12-22 DIAGNOSIS — R260 Ataxic gait: Secondary | ICD-10-CM

## 2016-12-22 MED ORDER — AMANTADINE HCL 100 MG PO CAPS: 100.0000 mg | ORAL_CAPSULE | Freq: Two times a day (BID) | ORAL | 4 refills | Status: DC

## 2016-12-22 NOTE — Progress Notes (Signed)
[facility]  PATIENT: Janice Little DOB: 09/10/1946  REFERRING DOCTOR OR PCP:  Juluis Rainier  SOURCE: Patient and records from Dr. Zachery Dauer.  _________________________________   HISTORICAL  CHIEF COMPLAINT:  Chief Complaint  Patient presents with  . Follow-up    MULTIPLE SCLEROSIS     HISTORY OF PRESENT ILLNESS:  [name] is a 71 yo woman diagnosed with MS after an episoide of suspected transverse myelitis in 1994 who had had right ON 8 years earlier.      MRI's in July 2016 showed brain changes more consistent with chronic microvascular ischemic events than with MS. MRI of the cervical spine showed degenerative changes. no spinal cord plaques.   She was placed on aspirin 81 mg after the MRI and she tolerates it well.    Gait/strength: She feels gait is off balance and she uses a cane.    Strength is fine but legs wobble if stands too long  Numbness:   The right anterolateral thigh is still numb after 2 years.   Dysesthesias are in the skin only and not deeper. It no longer itches.   She also notes some numbness in toes (felt due to DM)  She notes losing about 10 pounds this year and she is diabetic.     Bladder/bowel: She notes that the urinary incontinence is unchanged.    No urinary tract infections in the past couple of years.    She drinks cranberry juice to help prevent UTI.    In the past, she saw urology was placed on the medication without benefit.  Vision: Vision is baseline now.   She wears glasses.   She had ON in the past.    Fatigue/sleep: She is fatigued much of the time with poor stamina most days.   Physical fatigue > cognitive.    Also notes cognitive fatigue isbetter on aspirin.   She feels that she falls asleep and stays asleep well. She sometimes has trouble waking up in the morning. She snores but does not have pauses or gasping at night.   .  Mood/cognition: She denies any depression or anxiety. She denies current cognitive  problems but has felt in a fog at times. She fel better on Aspirin  MS/TN/ON History:  In 1986, she presented with right optic neuritis and was referred to Dr. Sandria Manly who placed her on oral steroids. She had an MRI of the brain that was normal at that time. 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 However, the MRI did not show any definite changes. Imaging studies were probably about 14 years ago and she last saw Dr. Sandria Manly around 2002.    REVIEW OF SYSTEMS: Constitutional: No fevers, chills, sweats, or change in appetite.   Notes fatigue Eyes: No   eye pain   See above. Ear, nose and throat: No hearing loss, ear pain, nasal congestion, sore throat Cardiovascular: No chest pain, palpitations Respiratory: No shortness of breath at rest or with exertion.   No wheezes GastrointestinaI: No nausea, vomiting, diarrhea, abdominal pain, fecal incontinence Genitourinary: as above Musculoskeletal: No neck pain, back pain Integumentary: No rash, pruritus, skin lesions Neurological: as above Psychiatric: No depression at this time.  No anxiety Endocrine: She has DM Hematologic/Lymphatic: No anemia, purpura, petechiae. Allergic/Immunologic: No itchy/runny eyes, nasal congestion, recent allergic reactions, rashes  ALLERGIES: No Known Allergies  HOME MEDICATIONS:  Current Outpatient Prescriptions:  .  amLODipine (NORVASC) 10  MG tablet, Take 10 mg by mouth daily., Disp: , Rfl:  .  atorvastatin (LIPITOR) 20 MG tablet, Take 20 mg by mouth daily., Disp: , Rfl:  .  estradiol (ESTRACE) 0.5 MG tablet, Take 0.5 mg by mouth daily., Disp: , Rfl:  .  metFORMIN (GLUMETZA) 500 MG (MOD) 24 hr tablet, Take 500 mg by mouth 2 (two) times daily with a meal., Disp: , Rfl:  .  Multiple Vitamin (MULTIVITAMIN) capsule, Take 1 capsule by mouth daily., Disp: , Rfl:  .  omeprazole (PRILOSEC OTC) 20 MG tablet, Take 20 mg by mouth daily., Disp: , Rfl:   .  amantadine (SYMMETREL) 100 MG capsule, Take 1 capsule (100 mg total) by mouth 2 (two) times daily., Disp: 180 capsule, Rfl: 4  PAST MEDICAL HISTORY: Past Medical History:  Diagnosis Date  . Chronic kidney disease (CKD), stage II (mild)   . Chronic pain   . DM type 2 (diabetes mellitus, type 2) (HCC)   . Fatty liver    per CT  . GERD (gastroesophageal reflux disease)   . History of hepatitis as a child    age 52 (jaundice)  . HTN (hypertension)   . Hyperlipidemia   . MS (multiple sclerosis) (HCC)   . Obesity   . Osteopenia   . Primary hyperparathyroidism (HCC)    s/p surgery removal of parathyroid (Dr. Sharl Ma, Dr. Gerrit Friends)  . Vitamin D deficiency     PAST SURGICAL HISTORY: Past Surgical History:  Procedure Laterality Date  . ANTERIOR CRUCIATE LIGAMENT REPAIR Left   . VESICOVAGINAL FISTULA CLOSURE W/ TAH      FAMILY HISTORY: Family History  Problem Relation Age of Onset  . Mental illness Mother   . COPD Father   . Lung cancer Father     SOCIAL HISTORY:  Social History   Social History  . Marital status: Legally Separated    Spouse name: N/A  . Number of children: N/A  . Years of education: N/A   Occupational History  . Not on file.   Social History Main Topics  . Smoking status: Never Smoker  . Smokeless tobacco: Never Used  . Alcohol use No  . Drug use: No  . Sexual activity: Not on file   Other Topics Concern  . Not on file   Social History Narrative  . No narrative on file     PHYSICAL EXAM  Vitals:   12/22/16 1350  BP: 131/66  Pulse: 91  Weight: 195 lb (88.5 kg)    Body mass index is 32.45 kg/m.   General: The patient is well-developed and well-nourished and in no acute distress  Eyes:  Funduscopic exam shows normal optic discs and retinal vessels.  Neck: The neck is supple, no carotid bruits are noted.  The neck is nontender.  Cardiovascular: The heart has a regular rate and rhythm with a normal S1 and S2. There were no murmurs,  gallops or rubs. Lungs are clear to auscultation.  Skin: Extremities are without significant edema.  Musculoskeletal:  Back is nontender  Neurologic Exam  Mental status: The patient is alert and oriented x 3 at the time of the examination. The patient has apparent normal recent and remote memory, with an apparently normal attention span and concentration ability.   Speech is normal.  Cranial nerves: Extraocular movements are full. Pupils are equal, round, and reactive to light and accomodation.  Color vision symmetric.   Facial symmetry is present. There is good facial sensation to soft touch  bilaterally.Facial strength is normal.  Trapezius and sternocleidomastoid strength is normal. No dysarthria is noted.  The tongue is midline, and the patient has symmetric elevation of the soft palate. No obvious hearing deficits are noted.  Motor:  Muscle bulk is normal.   Tone is normal. Strength is  5 / 5 in all 4 extremities.   4++/5 EHL in the feet.  Sensory: Sensory testing is intact to pinprick, soft touch and vibration sensation in arms.   Decreased sensation over the left lateral femoral cutaneous nerve.   Decreased vibration sensation only in the toes.  Coordination: Cerebellar testing reveals good finger-nose-finger and heel-to-shin bilaterally.  Gait and station: Station is normal.   Gait is mildly wide. Tandem gait is wide . Romberg is negative.   Reflexes: Deep tendon reflexes are symmetric and normal bilaterally.   Plantar responses are flexor.    DIAGNOSTIC DATA (LABS, IMAGING, TESTING) - I reviewed patient records, labs, notes, testing and imaging myself where available.      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    History of optic neuritis and transverse myelitis would be consistent with MS but her brain MRI shows nonspecific foci and chronic ischemic foci.    She will continue off of any disease modifying therapy.  2.   continue aspirin 81 mg by mouth daily 3.    Amantadine 100 mg po bid    Advised to remain active and exercise as tolerated.  4.    return to clinic in 12 months or sooner if there are new or worsening neurologic symptoms.     Traylon Schimming A. Epimenio Foot, MD, PhD 12/22/2016, 2:12 PM Certified in Neurology, Clinical Neurophysiology, Sleep Medicine, Pain Medicine and Neuroimaging  [facility] [address], [address] [place], [place] [address] [phone]

## 2017-12-26 ENCOUNTER — Encounter: Payer: Self-pay | Admitting: Neurology

## 2017-12-26 ENCOUNTER — Other Ambulatory Visit: Payer: Self-pay

## 2017-12-26 ENCOUNTER — Ambulatory Visit: Payer: Medicare Other | Admitting: Neurology

## 2017-12-26 VITALS — BP 162/80 | HR 83 | Resp 18 | Ht 65.0 in | Wt 186.0 lb

## 2017-12-26 DIAGNOSIS — R260 Ataxic gait: Secondary | ICD-10-CM | POA: Diagnosis not present

## 2017-12-26 DIAGNOSIS — G5711 Meralgia paresthetica, right lower limb: Secondary | ICD-10-CM | POA: Diagnosis not present

## 2017-12-26 DIAGNOSIS — Z8661 Personal history of infections of the central nervous system: Secondary | ICD-10-CM

## 2017-12-26 DIAGNOSIS — N39498 Other specified urinary incontinence: Secondary | ICD-10-CM

## 2017-12-26 DIAGNOSIS — R5383 Other fatigue: Secondary | ICD-10-CM | POA: Diagnosis not present

## 2017-12-26 NOTE — Progress Notes (Signed)
[facility]  PATIENT: Janice Little DOB: 1946/03/07  REFERRING DOCTOR OR PCP:  Juluis Rainier  SOURCE: Patient and records from Dr. Zachery Dauer.  _________________________________   HISTORICAL  CHIEF COMPLAINT:  Chief Complaint  Patient presents with  . History of Transverse Myelitis    She stopped Amantadine due to fear of side effects and also cost of medication.  Sts. she had a brief episode (less than 1 day) of right sided mid-back pain, about a month ago./fim  . History of Optic Neuritis    HISTORY OF PRESENT ILLNESS:  Janice Little is a 72 yo woman diagnosed with MS after an episoide of suspected transverse myelitis in 1994 who had had right ON 8 years earlier.        Update 12/26/2017: She was diagnosed with MS many years ago after she presented with transverse myelitis in 1994 on top of a history of previous optic neuritis.  She was never on any disease modifying therapy.  When I reviewed her MRIs in 2016, the MRI changes of the brain were more consistent with chronic microvascular ischemic change and I did not see any spinal cord plaques in the cervical spine.  She denies any new symptoms since last year.    She uses a cane and can walk a hundred feet if she had to.  Her right leg is weaker.   She has had 2 falls, once tripping on pavement and once when her slipper came off.  She sometimes has sharp stabbing pain in the LFCN numb region.  These spells last for a few seconds and are very intermittent.  Her urinary incontinence is unchanged.    She is very fatigued.  Fatigue is worse than sleepiness but she still takes naps daily x 1 hour.   .   Amantadine had not helped her and she stopped.  She gets 7 hours of sleep most nights and takes a nap or two.    She has snoring at night  (she recorded and there were only rare pauses).      I listened to about 3-4 minutes and only one pause.     From 12/22/2016:  MRI's in July 2016 showed brain changes more  consistent with chronic microvascular ischemic events than with MS. MRI of the cervical spine showed degenerative changes. no spinal cord plaques.   She was placed on aspirin 81 mg after the MRI and she tolerates it well.    Gait/strength: She feels gait is off balance and she uses a cane.    Strength is fine but legs wobble if stands too long  Numbness:   The right anterolateral thigh is still numb after 2 years.   Dysesthesias are in the skin only and not deeper. It no longer itches.   She also notes some numbness in toes (felt due to DM)  She notes losing about 10 pounds this year and she is diabetic.     Bladder/bowel: She notes that the urinary incontinence is unchanged.    No urinary tract infections in the past couple of years.    She drinks cranberry juice to help prevent UTI.    In the past, she saw urology was placed on the medication without benefit.  Vision: Vision is baseline now.   She wears glasses.   She had ON in the past.    Fatigue/sleep: She is fatigued much of the time with poor stamina most days.   Physical fatigue > cognitive.  Also notes cognitive fatigue isbetter on aspirin.   She feels that she falls asleep and stays asleep well. She sometimes has trouble waking up in the morning. She snores but does not have pauses or gasping at night.   .  Mood/cognition: She denies any depression or anxiety. She denies current cognitive problems but has felt in a fog at times. She fel better on Aspirin  MS/TN/ON History:  In 1986, she presented with right optic neuritis and was referred to Dr. Sandria Manly who placed her on oral steroids. She had an MRI of the brain that was normal at that time. 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 However, the MRI did not show any definite changes. Imaging studies were probably about 14 years ago and she last saw Dr. Sandria Manly around 2002.    REVIEW OF SYSTEMS: Constitutional: No  fevers, chills, sweats, or change in appetite.   Notes fatigue Eyes: No   eye pain   See above. Ear, nose and throat: No hearing loss, ear pain, nasal congestion, sore throat Cardiovascular: No chest pain, palpitations Respiratory: No shortness of breath at rest or with exertion.   No wheezes GastrointestinaI: No nausea, vomiting, diarrhea, abdominal pain, fecal incontinence Genitourinary: as above Musculoskeletal: No neck pain, back pain Integumentary: No rash, pruritus, skin lesions Neurological: as above Psychiatric: No depression at this time.  No anxiety Endocrine: She has DM Hematologic/Lymphatic: No anemia, purpura, petechiae. Allergic/Immunologic: No itchy/runny eyes, nasal congestion, recent allergic reactions, rashes  ALLERGIES: No Known Allergies  HOME MEDICATIONS:  Current Outpatient Medications:  .  amLODipine (NORVASC) 10 MG tablet, Take 10 mg by mouth daily., Disp: , Rfl:  .  atorvastatin (LIPITOR) 20 MG tablet, Take 20 mg by mouth daily., Disp: , Rfl:  .  estradiol (ESTRACE) 0.5 MG tablet, Take 0.5 mg by mouth daily., Disp: , Rfl:  .  metFORMIN (GLUMETZA) 500 MG (MOD) 24 hr tablet, Take 500 mg by mouth 2 (two) times daily with a meal., Disp: , Rfl:  .  Multiple Vitamin (MULTIVITAMIN) capsule, Take 1 capsule by mouth daily., Disp: , Rfl:  .  omeprazole (PRILOSEC OTC) 20 MG tablet, Take 20 mg by mouth daily., Disp: , Rfl:  .  UNABLE TO FIND, Lagerstroemia Speciosa leaf extract, Disp: , Rfl:  .  UNABLE TO FIND, Incromega [id number]  (fish oils), Disp: , Rfl:  .  amantadine (SYMMETREL) 100 MG capsule, Take 1 capsule (100 mg total) by mouth 2 (two) times daily., Disp: 180 capsule, Rfl: 4  PAST MEDICAL HISTORY: Past Medical History:  Diagnosis Date  . Chronic kidney disease (CKD), stage II (mild)   . Chronic pain   . DM type 2 (diabetes mellitus, type 2) (HCC)   . Fatty liver    per CT  . GERD (gastroesophageal reflux disease)   . History of hepatitis as a child     age 5 (jaundice)  . HTN (hypertension)   . Hyperlipidemia   . MS (multiple sclerosis) (HCC)   . Obesity   . Osteopenia   . Primary hyperparathyroidism (HCC)    s/p surgery removal of parathyroid (Dr. Sharl Ma, Dr. Gerrit Friends)  . Vitamin D deficiency     PAST SURGICAL HISTORY: Past Surgical History:  Procedure Laterality Date  . ANTERIOR CRUCIATE LIGAMENT REPAIR Left   . VESICOVAGINAL FISTULA CLOSURE W/ TAH      FAMILY HISTORY: Family History  Problem Relation Age of Onset  . Mental  illness Mother   . COPD Father   . Lung cancer Father     SOCIAL HISTORY:  Social History   Socioeconomic History  . Marital status: Legally Separated    Spouse name: Not on file  . Number of children: Not on file  . Years of education: Not on file  . Highest education level: Not on file  Occupational History  . Not on file  Social Needs  . Financial resource strain: Not on file  . Food insecurity:    Worry: Not on file    Inability: Not on file  . Transportation needs:    Medical: Not on file    Non-medical: Not on file  Tobacco Use  . Smoking status: Never Smoker  . Smokeless tobacco: Never Used  Substance and Sexual Activity  . Alcohol use: No  . Drug use: No  . Sexual activity: Not on file  Lifestyle  . Physical activity:    Days per week: Not on file    Minutes per session: Not on file  . Stress: Not on file  Relationships  . Social connections:    Talks on phone: Not on file    Gets together: Not on file    Attends religious service: Not on file    Active member of club or organization: Not on file    Attends meetings of clubs or organizations: Not on file    Relationship status: Not on file  . Intimate partner violence:    Fear of current or ex partner: Not on file    Emotionally abused: Not on file    Physically abused: Not on file    Forced sexual activity: Not on file  Other Topics Concern  . Not on file  Social History Narrative  . Not on file     PHYSICAL  EXAM  Vitals:   12/26/17 1402  BP: (!) 162/80  Pulse: 83  Resp: 18  Weight: 186 lb (84.4 kg)  Height: 5\' 5"  (1.651 m)    Body mass index is 30.95 kg/m.   General: The patient is well-developed and well-nourished and in no acute distress   Musculoskeletal:  Back is nontender  Neurologic Exam  Mental status: The patient is alert and oriented x 3 at the time of the examination. The patient has apparent normal recent and remote memory, with an apparently normal attention span and concentration ability.   Speech is normal.  Cranial nerves: Extraocular movements are full. Pupils are equal, round, and reactive to light and accomodation.  Color vision symmetric.   Facial symmetry is present. There is good facial sensation to soft touch bilaterally.Facial strength is normal.  Trapezius and sternocleidomastoid strength is normal. No dysarthria is noted.  The tongue is midline, and the patient has symmetric elevation of the soft palate. No obvious hearing deficits are noted.  Motor:  Muscle bulk is normal.   Tone is normal. Strength is  5 / 5 in all 4 extremities.   4++/5 EHL in the feet.  Sensory: She has normal sensation to touch and vibration in the arms and legs except for decrease touch sensation in the distribution of the right LFCN  Coordination: Cerebellar testing reveals good finger-nose-finger mildly reduced heel to shin bilaterally.  Gait and station: Station is normal.   Gait is mildly wide in the tandem gait is wide.. Romberg is negative.   Reflexes: Deep tendon reflexes are symmetric and normal bilaterally.   Plantar responses are flexor.  DIAGNOSTIC DATA (LABS, IMAGING, TESTING) - I reviewed patient records, labs, notes, testing and imaging myself where available.      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   She has a history of optic  neuritis and transverse myelitis but the MRI of the brain does not show foci consistent with MS.  Either she has a very mild MS or the 2 episodes are not linked.  Either way, I would not recommend a disease modifying therapy at her age. 2.   continue aspirin 81 mg by mouth daily 3.    If fatigue worsens, consider Nuvigil/Provigil.  Also consider a sleep study.   4.    return to clinic in 12 months or sooner if there are new or worsening neurologic symptoms.     Richard A. Epimenio Foot, MD, PhD 12/26/2017, 2:42 PM Certified in Neurology, Clinical Neurophysiology, Sleep Medicine, Pain Medicine and Neuroimaging  [facility] [address] [place], [place] [address] [phone]

## 2018-12-19 ENCOUNTER — Other Ambulatory Visit: Payer: Self-pay

## 2018-12-19 ENCOUNTER — Inpatient Hospital Stay (HOSPITAL_COMMUNITY)
Admission: EM | Admit: 2018-12-19 | Discharge: 2018-12-26 | DRG: 304 | Disposition: A | Discharge status: Home Health | Payer: Medicare Other | Attending: Internal Medicine | Admitting: Internal Medicine

## 2018-12-19 ENCOUNTER — Inpatient Hospital Stay (HOSPITAL_COMMUNITY): Payer: Medicare Other

## 2018-12-19 ENCOUNTER — Encounter (HOSPITAL_COMMUNITY): Payer: Self-pay

## 2018-12-19 ENCOUNTER — Emergency Department (HOSPITAL_COMMUNITY): Payer: Medicare Other

## 2018-12-19 DIAGNOSIS — R2681 Unsteadiness on feet: Secondary | ICD-10-CM | POA: Diagnosis present

## 2018-12-19 DIAGNOSIS — E785 Hyperlipidemia, unspecified: Secondary | ICD-10-CM | POA: Diagnosis present

## 2018-12-19 DIAGNOSIS — K219 Gastro-esophageal reflux disease without esophagitis: Secondary | ICD-10-CM | POA: Diagnosis not present

## 2018-12-19 DIAGNOSIS — G8929 Other chronic pain: Secondary | ICD-10-CM | POA: Diagnosis present

## 2018-12-19 DIAGNOSIS — R260 Ataxic gait: Secondary | ICD-10-CM | POA: Diagnosis present

## 2018-12-19 DIAGNOSIS — Z8669 Personal history of other diseases of the nervous system and sense organs: Secondary | ICD-10-CM

## 2018-12-19 DIAGNOSIS — T428X5A Adverse effect of antiparkinsonism drugs and other central muscle-tone depressants, initial encounter: Secondary | ICD-10-CM | POA: Diagnosis not present

## 2018-12-19 DIAGNOSIS — R339 Retention of urine, unspecified: Secondary | ICD-10-CM | POA: Diagnosis not present

## 2018-12-19 DIAGNOSIS — I161 Hypertensive emergency: Principal | ICD-10-CM | POA: Diagnosis present

## 2018-12-19 DIAGNOSIS — G35 Multiple sclerosis: Secondary | ICD-10-CM | POA: Diagnosis present

## 2018-12-19 DIAGNOSIS — N179 Acute kidney failure, unspecified: Secondary | ICD-10-CM | POA: Diagnosis present

## 2018-12-19 DIAGNOSIS — N182 Chronic kidney disease, stage 2 (mild): Secondary | ICD-10-CM | POA: Diagnosis present

## 2018-12-19 DIAGNOSIS — I129 Hypertensive chronic kidney disease with stage 1 through stage 4 chronic kidney disease, or unspecified chronic kidney disease: Secondary | ICD-10-CM | POA: Diagnosis present

## 2018-12-19 DIAGNOSIS — E1140 Type 2 diabetes mellitus with diabetic neuropathy, unspecified: Secondary | ICD-10-CM | POA: Diagnosis not present

## 2018-12-19 DIAGNOSIS — R4182 Altered mental status, unspecified: Secondary | ICD-10-CM | POA: Diagnosis not present

## 2018-12-19 DIAGNOSIS — I160 Hypertensive urgency: Secondary | ICD-10-CM

## 2018-12-19 DIAGNOSIS — H469 Unspecified optic neuritis: Secondary | ICD-10-CM | POA: Diagnosis present

## 2018-12-19 DIAGNOSIS — R1310 Dysphagia, unspecified: Secondary | ICD-10-CM | POA: Diagnosis not present

## 2018-12-19 DIAGNOSIS — K5900 Constipation, unspecified: Secondary | ICD-10-CM | POA: Diagnosis not present

## 2018-12-19 DIAGNOSIS — G0491 Myelitis, unspecified: Secondary | ICD-10-CM | POA: Diagnosis present

## 2018-12-19 DIAGNOSIS — G250 Essential tremor: Secondary | ICD-10-CM | POA: Diagnosis present

## 2018-12-19 DIAGNOSIS — Z7984 Long term (current) use of oral hypoglycemic drugs: Secondary | ICD-10-CM

## 2018-12-19 DIAGNOSIS — E1122 Type 2 diabetes mellitus with diabetic chronic kidney disease: Secondary | ICD-10-CM | POA: Diagnosis present

## 2018-12-19 DIAGNOSIS — E11649 Type 2 diabetes mellitus with hypoglycemia without coma: Secondary | ICD-10-CM | POA: Diagnosis present

## 2018-12-19 DIAGNOSIS — G9341 Metabolic encephalopathy: Secondary | ICD-10-CM | POA: Diagnosis present

## 2018-12-19 DIAGNOSIS — Z8673 Personal history of transient ischemic attack (TIA), and cerebral infarction without residual deficits: Secondary | ICD-10-CM

## 2018-12-19 DIAGNOSIS — Y92230 Patient room in hospital as the place of occurrence of the external cause: Secondary | ICD-10-CM | POA: Diagnosis not present

## 2018-12-19 DIAGNOSIS — Z8661 Personal history of infections of the central nervous system: Secondary | ICD-10-CM

## 2018-12-19 DIAGNOSIS — E559 Vitamin D deficiency, unspecified: Secondary | ICD-10-CM | POA: Diagnosis present

## 2018-12-19 DIAGNOSIS — I10 Essential (primary) hypertension: Secondary | ICD-10-CM

## 2018-12-19 DIAGNOSIS — Z79899 Other long term (current) drug therapy: Secondary | ICD-10-CM | POA: Diagnosis not present

## 2018-12-19 DIAGNOSIS — M79671 Pain in right foot: Secondary | ICD-10-CM

## 2018-12-19 DIAGNOSIS — M8580 Other specified disorders of bone density and structure, unspecified site: Secondary | ICD-10-CM | POA: Diagnosis present

## 2018-12-19 LAB — CBC WITH DIFFERENTIAL/PLATELET
Abs Immature Granulocytes: 0.08 10*3/uL — ABNORMAL HIGH (ref 0.00–0.07)
Basophils Absolute: 0.1 10*3/uL (ref 0.0–0.1)
Basophils Relative: 1 %
Eosinophils Absolute: 0 10*3/uL (ref 0.0–0.5)
Eosinophils Relative: 0 %
HCT: 42.6 % (ref 36.0–46.0)
Hemoglobin: 13.4 g/dL (ref 12.0–15.0)
Immature Granulocytes: 1 %
Lymphocytes Relative: 12 %
Lymphs Abs: 1.3 10*3/uL (ref 0.7–4.0)
MCH: 27.4 pg (ref 26.0–34.0)
MCHC: 31.5 g/dL (ref 30.0–36.0)
MCV: 87.1 fL (ref 80.0–100.0)
Monocytes Absolute: 0.4 10*3/uL (ref 0.1–1.0)
Monocytes Relative: 3 %
Neutro Abs: 8.7 10*3/uL — ABNORMAL HIGH (ref 1.7–7.7)
Neutrophils Relative %: 83 %
Platelets: 250 10*3/uL (ref 150–400)
RBC: 4.89 MIL/uL (ref 3.87–5.11)
RDW: 13.3 % (ref 11.5–15.5)
WBC: 10.5 10*3/uL (ref 4.0–10.5)
nRBC: 0 % (ref 0.0–0.2)

## 2018-12-19 LAB — I-STAT TROPONIN, ED: Troponin i, poc: 0.01 ng/mL (ref 0.00–0.08)

## 2018-12-19 LAB — COMPREHENSIVE METABOLIC PANEL WITH GFR
ALT: 23 U/L (ref 0–44)
AST: 24 U/L (ref 15–41)
Albumin: 3.8 g/dL (ref 3.5–5.0)
Alkaline Phosphatase: 117 U/L (ref 38–126)
Anion gap: 11 (ref 5–15)
BUN: 21 mg/dL (ref 8–23)
CO2: 22 mmol/L (ref 22–32)
Calcium: 9.3 mg/dL (ref 8.9–10.3)
Chloride: 107 mmol/L (ref 98–111)
Creatinine, Ser: 1.09 mg/dL — ABNORMAL HIGH (ref 0.44–1.00)
GFR calc Af Amer: 58 mL/min — ABNORMAL LOW
GFR calc non Af Amer: 50 mL/min — ABNORMAL LOW
Glucose, Bld: 168 mg/dL — ABNORMAL HIGH (ref 70–99)
Potassium: 3.5 mmol/L (ref 3.5–5.1)
Sodium: 140 mmol/L (ref 135–145)
Total Bilirubin: 0.6 mg/dL (ref 0.3–1.2)
Total Protein: 6.1 g/dL — ABNORMAL LOW (ref 6.5–8.1)

## 2018-12-19 LAB — PROTIME-INR
INR: 1 (ref 0.8–1.2)
Prothrombin Time: 13.1 seconds (ref 11.4–15.2)

## 2018-12-19 LAB — GLUCOSE, CAPILLARY: Glucose-Capillary: 155 mg/dL — ABNORMAL HIGH (ref 70–99)

## 2018-12-19 LAB — CBG MONITORING, ED: Glucose-Capillary: 152 mg/dL — ABNORMAL HIGH (ref 70–99)

## 2018-12-19 MED ORDER — HEPARIN SODIUM (PORCINE) 5000 UNIT/ML IJ SOLN
5000.0000 [IU] | Freq: Three times a day (TID) | INTRAMUSCULAR | Status: DC
  Administered 2018-12-19 – 2018-12-26 (×20): 5000 [IU] via SUBCUTANEOUS
  Filled 2018-12-19 (×20): qty 1

## 2018-12-19 MED ORDER — PANTOPRAZOLE SODIUM 40 MG PO TBEC
40.0000 mg | DELAYED_RELEASE_TABLET | Freq: Every day | ORAL | Status: DC
  Administered 2018-12-20 – 2018-12-26 (×7): 40 mg via ORAL
  Filled 2018-12-19 (×7): qty 1

## 2018-12-19 MED ORDER — LABETALOL HCL 5 MG/ML IV SOLN
10.0000 mg | Freq: Once | INTRAVENOUS | Status: AC
  Administered 2018-12-19: 10 mg via INTRAVENOUS
  Filled 2018-12-19: qty 4

## 2018-12-19 MED ORDER — HYDRALAZINE HCL 20 MG/ML IJ SOLN
10.0000 mg | Freq: Once | INTRAMUSCULAR | Status: AC
  Administered 2018-12-19: 10 mg via INTRAVENOUS
  Filled 2018-12-19: qty 1

## 2018-12-19 MED ORDER — OXYCODONE HCL 5 MG PO TABS
5.0000 mg | ORAL_TABLET | ORAL | Status: DC | PRN
  Administered 2018-12-22 – 2018-12-24 (×5): 5 mg via ORAL
  Filled 2018-12-19 (×5): qty 1

## 2018-12-19 MED ORDER — ACETAMINOPHEN 650 MG RE SUPP: 650.0000 mg | Freq: Four times a day (QID) | RECTAL | Status: DC | PRN

## 2018-12-19 MED ORDER — AMANTADINE HCL 100 MG PO CAPS
100.0000 mg | ORAL_CAPSULE | Freq: Two times a day (BID) | ORAL | Status: DC
  Administered 2018-12-19 – 2018-12-22 (×6): 100 mg via ORAL
  Filled 2018-12-19 (×7): qty 1

## 2018-12-19 MED ORDER — OMEPRAZOLE MAGNESIUM 20 MG PO TBEC: 20.0000 mg | DELAYED_RELEASE_TABLET | Freq: Every day | ORAL | Status: DC

## 2018-12-19 MED ORDER — ATORVASTATIN CALCIUM 10 MG PO TABS
20.0000 mg | ORAL_TABLET | Freq: Every day | ORAL | Status: DC
  Administered 2018-12-19 – 2018-12-26 (×8): 20 mg via ORAL
  Filled 2018-12-19 (×9): qty 2

## 2018-12-19 MED ORDER — SODIUM CHLORIDE 0.9% FLUSH
3.0000 mL | Freq: Two times a day (BID) | INTRAVENOUS | Status: DC
  Administered 2018-12-19 – 2018-12-26 (×14): 3 mL via INTRAVENOUS

## 2018-12-19 MED ORDER — INSULIN ASPART 100 UNIT/ML ~~LOC~~ SOLN
0.0000 [IU] | Freq: Three times a day (TID) | SUBCUTANEOUS | Status: DC
  Administered 2018-12-20: 2 [IU] via SUBCUTANEOUS
  Administered 2018-12-20 – 2018-12-25 (×11): 1 [IU] via SUBCUTANEOUS
  Administered 2018-12-26: 2 [IU] via SUBCUTANEOUS
  Administered 2018-12-26: 1 [IU] via SUBCUTANEOUS

## 2018-12-19 MED ORDER — MULTIVITAMINS PO CAPS: 1.0000 | ORAL_CAPSULE | Freq: Every day | ORAL | Status: DC

## 2018-12-19 MED ORDER — ALBUTEROL SULFATE (2.5 MG/3ML) 0.083% IN NEBU: 2.5000 mg | INHALATION_SOLUTION | RESPIRATORY_TRACT | Status: DC | PRN

## 2018-12-19 MED ORDER — HYDRALAZINE HCL 20 MG/ML IJ SOLN
10.0000 mg | INTRAMUSCULAR | Status: DC | PRN
  Administered 2018-12-19 – 2018-12-25 (×4): 10 mg via INTRAVENOUS
  Filled 2018-12-19 (×6): qty 1

## 2018-12-19 MED ORDER — ACETAMINOPHEN 325 MG PO TABS
650.0000 mg | ORAL_TABLET | Freq: Four times a day (QID) | ORAL | Status: DC | PRN
  Administered 2018-12-25 – 2018-12-26 (×2): 650 mg via ORAL
  Filled 2018-12-19 (×2): qty 2

## 2018-12-19 MED ORDER — AMLODIPINE BESYLATE 5 MG PO TABS
10.0000 mg | ORAL_TABLET | Freq: Every day | ORAL | Status: DC
  Administered 2018-12-20: 10 mg via ORAL
  Filled 2018-12-19: qty 2

## 2018-12-19 MED ORDER — ADULT MULTIVITAMIN W/MINERALS CH
1.0000 | ORAL_TABLET | Freq: Every day | ORAL | Status: DC
  Administered 2018-12-20 – 2018-12-26 (×7): 1 via ORAL
  Filled 2018-12-19 (×7): qty 1

## 2018-12-19 MED ORDER — ACETAMINOPHEN 500 MG PO TABS
1000.0000 mg | ORAL_TABLET | Freq: Once | ORAL | Status: AC
  Administered 2018-12-19: 1000 mg via ORAL
  Filled 2018-12-19: qty 2

## 2018-12-19 NOTE — ED Notes (Addendum)
Hospitalist at bedside. Plan to watch BP before sending patient upstairs. Treat BP with additional Hydralazine if needed to reduce BP. Page MD if does not work so that [place] [employer] can be ordered at that time. Arsenio Loader, MEd, RN CEN 12/19/2018 7:08 PM

## 2018-12-19 NOTE — ED Provider Notes (Signed)
[facility] EMERGENCY DEPARTMENT Provider Note   CSN: [id number] Arrival date & time: 12/19/18  1651    History   Chief Complaint Chief Complaint  Patient presents with   Altered Mental Status   Hypertension    HPI Janice Little is a 73 y.o. female.     73 year old female with prior medical history as detailed below presents for evaluation of altered mental status.  Patient was brought to the ED by her son.  Her son is an emergency room RN.  He reports that he came to check on her today and found her to be very confused.  This is not her normal.  Her last known well is not known.  Patient apparently is very confused as to her situation, her place, the year, and answers questions slowly.  She is otherwise without complaint.  Level 5 caveat secondary to her confusion.  The history is provided by the patient, a relative and medical records.  Altered Mental Status  Presenting symptoms: confusion and disorientation   Severity:  Moderate Most recent episode: Unknown. Episode history:  Unable to specify Timing:  Constant Progression:  Unchanged Chronicity:  New Hypertension     Past Medical History:  Diagnosis Date   Chronic kidney disease (CKD), stage II (mild)    Chronic pain    DM type 2 (diabetes mellitus, type 2) (HCC)    Fatty liver    per CT   GERD (gastroesophageal reflux disease)    History of hepatitis as a child    age 24 (jaundice)   HTN (hypertension)    Hyperlipidemia    MS (multiple sclerosis) (HCC)    Obesity    Osteopenia    Primary hyperparathyroidism (HCC)    s/p surgery removal of parathyroid (Dr. Sharl Ma, Dr. 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      Home Medications    Prior to Admission medications   Medication Sig Start Date End Date Taking? Authorizing Provider  amantadine (SYMMETREL) 100 MG capsule Take 1 capsule (100 mg total) by mouth 2 (two) times daily. 12/22/16   Sater, Pearletha Furl, MD  amLODipine (NORVASC) 10 MG tablet Take 10 mg by mouth daily.    [provider]  atorvastatin (LIPITOR) 20 MG tablet Take 20 mg by mouth daily.    [provider]  estradiol (ESTRACE) 0.5 MG tablet Take 0.5 mg by mouth daily.    [provider]  metFORMIN (GLUMETZA) 500 MG (MOD) 24 hr tablet Take 500 mg by mouth 2 (two) times daily with a meal.    [provider]  Multiple Vitamin (MULTIVITAMIN) capsule Take 1 capsule  by mouth daily.    [provider]  omeprazole (PRILOSEC OTC) 20 MG tablet Take 20 mg by mouth daily.    [provider]  UNABLE TO FIND Lagerstroemia Speciosa leaf extract    [provider]  UNABLE TO FIND Incromega [phone]  (fish oils)    [provider]    Family History Family History  Problem Relation Age of Onset   Mental illness Mother    COPD Father    Lung cancer Father     Social History Social History   Tobacco Use   Smoking status: Never Smoker   Smokeless tobacco: Never Used  Substance Use Topics   Alcohol use: No   Drug use: No     Allergies   Patient has no  known allergies.   Review of Systems Review of Systems  Psychiatric/Behavioral: Positive for confusion.  All other systems reviewed and are negative.    Physical Exam Updated Vital Signs BP (!) 220/111    Pulse (!) 107    Temp 98.6 F (37 C) (Oral)    Resp 14    SpO2 96%   Physical Exam Vitals signs and nursing note reviewed.  Constitutional:      General: She is not in acute distress.    Appearance: Normal appearance. She is well-developed.  HENT:     Head: Normocephalic and atraumatic.  Eyes:     Conjunctiva/sclera: Conjunctivae normal.     Pupils: Pupils are equal, round, and reactive to light.  Neck:     Musculoskeletal: Normal range of motion and neck supple.  Cardiovascular:     Rate and Rhythm: Normal rate and regular rhythm.     Heart sounds: Normal heart sounds.  Pulmonary:     Effort: Pulmonary effort is normal. No respiratory distress.     Breath sounds: Normal breath sounds.  Abdominal:     General: There is no distension.     Palpations: Abdomen is soft.     Tenderness: There is no abdominal tenderness.  Musculoskeletal: Normal range of motion.        General: No deformity.  Skin:    General: Skin is warm and dry.  Neurological:     General: No focal deficit present.     Mental Status: She is alert. She is disoriented.     Cranial Nerves: No cranial nerve deficit.     Sensory: No sensory deficit.     Motor: No weakness.     Coordination: Coordination normal.     Comments: Alert Disoriented Oriented only to person  No facial droop.   VAN negative.  No focal weakness.      ED Treatments / Results  Labs (all labs ordered are listed, but only abnormal results are displayed) Labs Reviewed  COMPREHENSIVE METABOLIC PANEL - Abnormal; Notable for the following components:      Result Value   Glucose, Bld 168 (*)    Creatinine, Ser 1.09 (*)    Total Protein 6.1 (*)    GFR calc non Af Amer 50 (*)    GFR calc Af Amer 58 (*)    All other  components within normal limits  CBC WITH DIFFERENTIAL/PLATELET - Abnormal; Notable for the following components:   Neutro Abs 8.7 (*)    Abs Immature Granulocytes 0.08 (*)    All other components within normal limits  CBG MONITORING, ED - Abnormal; Notable for the following components:   Glucose-Capillary 152 (*)    All other components  within normal limits  PROTIME-INR  RAPID URINE DRUG SCREEN, HOSP PERFORMED  URINALYSIS, ROUTINE W REFLEX MICROSCOPIC  I-STAT TROPONIN, ED    EKG EKG Interpretation  Date/Time:  Wednesday December 19 2018 17:05:26 EDT Ventricular Rate:  110 PR Interval:    QRS Duration: 98 QT Interval:  355 QTC Calculation: 481 R Axis:   -47 Text Interpretation:  Sinus tachycardia Probable left atrial enlargement Inferior infarct, old Probable anterolateral infarct, age indeterm Confirmed by Kristine Royal [phone]) on 12/19/2018 5:28:51 PM   Radiology Ct Head Wo Contrast  Result Date: 12/19/2018 CLINICAL DATA:  Altered mental status. Multiple sclerosis. EXAM: CT HEAD WITHOUT CONTRAST TECHNIQUE: Contiguous axial images were obtained from the base of the skull through the vertex without intravenous contrast. COMPARISON:  Brain MR dated 04/17/2015. FINDINGS: Brain: Mild-to-moderate enlargement of the ventricles and subarachnoid spaces. Mild-to-moderate patchy white matter low density in both cerebral hemispheres. 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 No intracranial hemorrhage, mass lesion or CT evidence of acute infarction. Vascular: No hyperdense vessel or unexpected calcification. Skull: Normal. Negative for fracture or focal lesion. Sinuses/Orbits: Unremarkable. Other: None. IMPRESSION: 1. No acute abnormality. 2. 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 3. Mildly progressive atrophy and chronic small vessel white matter ischemic changes and possible changes related to the patient's multiple sclerosis. Electronically Signed   By: Beckie Salts M.D.   On:  12/19/2018 17:50    Procedures Procedures (including critical care time) CRITICAL CARE Performed by: Wynetta Fines   Total critical care time: 30  minutes  Critical care time was exclusive of separately billable procedures and treating other patients.  Critical care was necessary to treat or prevent imminent or life-threatening deterioration.  Critical care was time spent personally by me on the following activities: development of treatment plan with patient and/or surrogate as well as nursing, discussions with consultants, evaluation of patient's response to treatment, examination of patient, obtaining history from patient or surrogate, ordering and performing treatments and interventions, ordering and review of laboratory studies, ordering and review of radiographic studies, pulse oximetry and re-evaluation of patient's condition.   Medications Ordered in ED Medications - No data to display   Initial Impression / Assessment and Plan / ED Course  I have reviewed the triage vital signs and the nursing notes.  Pertinent labs & imaging results that were available during my care of the patient were reviewed by me and considered in my medical decision making (see chart for details).        MDM  Screen complete  Patient is presenting for evaluation of alteration in her mental status with associated hypertension.  CT imaging does not reveal acute stroke.  Timeframe since onset of her symptoms is completely unknown.  Other screening labs are without significant abnormality  Case is discussed with neurology Amada Jupiter) and they will consult.  Hospitalist service is aware of case and will admit for further work-up and treatment.  Final Clinical Impressions(s) / ED Diagnoses   Final diagnoses:  Altered mental status, unspecified altered mental status type  Hypertensive emergency    ED Discharge Orders    None       Wynetta Fines, MD 12/19/18 1900

## 2018-12-19 NOTE — ED Notes (Signed)
Yellow socks, yellow band placed on pt. Fall risk sign placed on door.

## 2018-12-19 NOTE — ED Notes (Signed)
ED TO INPATIENT HANDOFF REPORT  ED Nurse Name and Phone #: Alona Bene  [phone]  S Name/Age/Gender Margie Billet Freeze 73 y.o. female Room/Bed: 026C/026C  Code Status   Code Status: Not on file  Home/SNF/Other Home {Patient oriented to:22149:::1) Person Is this baseline? No  No  Triage Complete: Triage complete  Chief Complaint AMS  Triage Note Per son: Pt has altered mental status. Son went to check on her and she was confused, she couldn't find her purse, didn't know what glasses were. Pt unable to tell this RN what her last name is. Son unsure of last seen normal. Pt is usually independent, alert and oriented X 4. Pt does have MS and uses a cane to walk.    Allergies No Known Allergies  Level of Care/Admitting Diagnosis ED Disposition    None      B Medical/Surgery History Past Medical History:  Diagnosis Date  . Chronic kidney disease (CKD), stage II (mild)   . Chronic pain   . DM type 2 (diabetes mellitus, type 2) (HCC)   . Fatty liver    per CT  . GERD (gastroesophageal reflux disease)   . History of hepatitis as a child    age 73 (jaundice)  . HTN (hypertension)   . Hyperlipidemia   . MS (multiple sclerosis) (HCC)   . Obesity   . Osteopenia   . Primary hyperparathyroidism (HCC)    s/p surgery removal of parathyroid (Dr. Sharl Ma, Dr. Gerrit Friends)  . Vitamin D deficiency    Past Surgical History:  Procedure Laterality Date  . ANTERIOR CRUCIATE LIGAMENT REPAIR Left   . VESICOVAGINAL FISTULA CLOSURE W/ TAH       A IV Location/Drains/Wounds Patient Lines/Drains/Airways Status   Active Line/Drains/Airways    Name:   Placement date:   Placement time:   Site:   Days:   Peripheral IV 12/19/18 Right Forearm   12/19/18    1707    Forearm   less than 1          Intake/Output Last 24 hours No intake or output data in the 24 hours ending 12/19/18 1832  Labs/Imaging Results for orders placed or performed during the hospital encounter of 12/19/18 (from the past 48  hour(s))  CBG monitoring, ED     Status: Abnormal   Collection Time: 12/19/18  5:17 PM  Result Value Ref Range   Glucose-Capillary 152 (H) 70 - 99 mg/dL  Comprehensive metabolic panel     Status: Abnormal   Collection Time: 12/19/18  5:19 PM  Result Value Ref Range   Sodium 140 135 - 145 mmol/L   Potassium 3.5 3.5 - 5.1 mmol/L   Chloride 107 98 - 111 mmol/L   CO2 22 22 - 32 mmol/L   Glucose, Bld 168 (H) 70 - 99 mg/dL   BUN 21 8 - 23 mg/dL   Creatinine, Ser 0.08 (H) 0.44 - 1.00 mg/dL   Calcium 9.3 8.9 - 67.6 mg/dL   Total Protein 6.1 (L) 6.5 - 8.1 g/dL   Albumin 3.8 3.5 - 5.0 g/dL   AST 24 15 - 41 U/L   ALT 23 0 - 44 U/L   Alkaline Phosphatase 117 38 - 126 U/L   Total Bilirubin 0.6 0.3 - 1.2 mg/dL   GFR calc non Af Amer 50 (L) >60 mL/min   GFR calc Af Amer 58 (L) >60 mL/min   Anion gap 11 5 - 15    Comment: Performed at [facility], [address] [place], [place] [address]  CBC with Differential     Status: Abnormal   Collection Time: 12/19/18  5:19 PM  Result Value Ref Range   WBC 10.5 4.0 - 10.5 K/uL   RBC 4.89 3.87 - 5.11 MIL/uL   Hemoglobin 13.4 12.0 - 15.0 g/dL   HCT 84.6 96.2 - 95.2 %   MCV 87.1 80.0 - 100.0 fL   MCH 27.4 26.0 - 34.0 pg   MCHC 31.5 30.0 - 36.0 g/dL   RDW 84.1 32.4 - 40.1 %   Platelets 250 150 - 400 K/uL   nRBC 0.0 0.0 - 0.2 %   Neutrophils Relative % 83 %   Neutro Abs 8.7 (H) 1.7 - 7.7 K/uL   Lymphocytes Relative 12 %   Lymphs Abs 1.3 0.7 - 4.0 K/uL   Monocytes Relative 3 %   Monocytes Absolute 0.4 0.1 - 1.0 K/uL   Eosinophils Relative 0 %   Eosinophils Absolute 0.0 0.0 - 0.5 K/uL   Basophils Relative 1 %   Basophils Absolute 0.1 0.0 - 0.1 K/uL   Immature Granulocytes 1 %   Abs Immature Granulocytes 0.08 (H) 0.00 - 0.07 K/uL    Comment: Performed at [facility], [address], [place], [place] [address]  Protime-INR     Status: None   Collection Time: 12/19/18  5:19 PM  Result Value Ref Range   Prothrombin Time 13.1 11.4 -  15.2 seconds   INR 1.0 0.8 - 1.2    Comment: (NOTE) INR goal varies based on device and disease states. Performed at [facility], [address] [place], [place] [address]   I-Stat Troponin, ED (not at [facility])     Status: None   Collection Time: 12/19/18  5:20 PM  Result Value Ref Range   Troponin i, poc 0.01 0.00 - 0.08 ng/mL   Comment 3            Comment: Due to the release kinetics of cTnI, a negative result within the first hours of the onset of symptoms does not rule out myocardial infarction with certainty. If myocardial infarction is still suspected, repeat the test at appropriate intervals.    Ct Head Wo Contrast  Result Date: 12/19/2018 CLINICAL DATA:  Altered mental status. Multiple sclerosis. EXAM: CT HEAD WITHOUT CONTRAST TECHNIQUE: Contiguous axial images were obtained from the base of the skull through the vertex without intravenous contrast. COMPARISON:  Brain MR dated 04/17/2015. FINDINGS: Brain: Mild-to-moderate enlargement of the ventricles and subarachnoid spaces. Mild-to-moderate patchy white matter low density in both cerebral hemispheres. 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 No intracranial hemorrhage, mass lesion or CT evidence of acute infarction. Vascular: No hyperdense vessel or unexpected calcification. Skull: Normal. Negative for fracture or focal lesion. Sinuses/Orbits: Unremarkable. Other: None. IMPRESSION: 1. No acute abnormality. 2. 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 3. Mildly progressive atrophy and chronic small vessel white matter ischemic changes and possible changes related to the patient's multiple sclerosis. Electronically Signed   By: Beckie Salts M.D.   On: 12/19/2018 17:50    Pending Labs Unresulted Labs (From admission, onward)    Start     Ordered   12/19/18 1711  Urine rapid drug screen (hosp performed)  ONCE - STAT,   STAT     12/19/18 1710   12/19/18 1711  Urinalysis, Routine w reflex microscopic  ONCE - STAT,    STAT     12/19/18 1710  Vitals/Pain Today's Vitals   12/19/18 1703 12/19/18 1739 12/19/18 1745 12/19/18 1815  BP: (!) 220/111  (!) 206/90 (!) 207/97  Pulse: (!) 107  92 94  Resp: 14  16 16   Temp: 98.6 F (37 C)     TempSrc: Oral     SpO2: 96%  94% 95%  PainSc:  8       Isolation Precautions No active isolations  Medications Medications  acetaminophen (TYLENOL) tablet 1,000 mg (has no administration in time range)  labetalol (NORMODYNE,TRANDATE) injection 10 mg (10 mg Intravenous Given 12/19/18 1737)    Mobility walks with device High fall risk   Focused Assessments Neuro Assessment Handoff:  Swallow screen pass? Yes          Neuro Assessment: Exceptions to WDL Neuro Checks:      Last Documented NIHSS Modified Score:   Has TPA been given? No If patient is a Neuro Trauma and patient is going to OR before floor call report to 4N Charge nurse: [phone] or [phone]     R Recommendations: See Admitting Provider Note  Report given to:   Additional Notes: Patient presents with new onset confusion and elevated blood pressure. CT done in ED. Tylenol given in ED. Patient awaiting MRI to r/o CVA. Urine culture sent to lab, still pending. Patient stable. Arsenio Loader, MEd, RN [facility] 12/19/2018 6:35 PM

## 2018-12-19 NOTE — ED Notes (Signed)
Dr. Messick at bedside 

## 2018-12-19 NOTE — ED Notes (Signed)
CBG 152  

## 2018-12-19 NOTE — ED Notes (Signed)
Purewick placed on pt by [facility] Nursing student and Alexus(NT)

## 2018-12-19 NOTE — H&P (Signed)
TRH H&P   Patient Demographics:    Janice Little, is a 73 y.o. female  MRN: [id number]   DOB - July 12, 1946  Admit Date - 12/19/2018  Outpatient Primary MD for the patient is Juluis Rainier, MD  Referring MD/NP/PA: Dr Rodena Medin  Patient coming from: Home  Chief Complaint  Patient presents with  . Altered Mental Status  . Hypertension      HPI:    Janice Little  is a 73 y.o. female, past medical history of MS, ambulatory with cane, still driving, hypertension, diabetes mellitus, hyperlipidemia, she was brought by her son secondary to altered mental status, son is a Engineer, civil (consulting), he visited her every few weeks, she was noted to be very confused to him today so he brought her to ED, never had slurred speech, facial droop, focal deficits, tingling or numbness, does not know when this started, for that has been constant, since he saw her today, usually awake alert x3, still driving, currently she is significantly confused, only alert to name. - in ED CT head with no acute finding, significant for old CVA, blood pressure elevated at 220/111, x-ray with no acute findings, UA still pending, no significant lab abnormalities, I was called to admit    Review of systems:     could not obtain reliable review of system from her and her significant confusion   With Past History of the following :    Past Medical History:  Diagnosis Date  . Chronic kidney disease (CKD), stage II (mild)   . Chronic pain   . DM type 2 (diabetes mellitus, type 2) (HCC)   . Fatty liver    per CT  . GERD (gastroesophageal reflux disease)   . History of hepatitis as a child    age 40 (jaundice)  . HTN (hypertension)   . Hyperlipidemia   . MS (multiple sclerosis) (HCC)   . Obesity   . Osteopenia   . Primary hyperparathyroidism (HCC)    s/p surgery removal of parathyroid (Dr. Sharl Ma, Dr. Gerrit Friends)  . Vitamin  D deficiency       Past Surgical History:  Procedure Laterality Date  . ANTERIOR CRUCIATE LIGAMENT REPAIR Left   . VESICOVAGINAL FISTULA CLOSURE W/ TAH        Social History:     Social History   Tobacco Use  . Smoking status: Never Smoker  . Smokeless tobacco: Never Used  Substance Use Topics  . Alcohol use: No     Lives -home alone  Mobility -with cane and walker, still driving    Family History :     Family History  Problem Relation Age of Onset  . Mental illness Mother   . COPD Father   . Lung cancer Father       Home Medications:   Prior to Admission medications   Medication Sig Start Date End Date Taking? Authorizing Provider  amantadine (SYMMETREL) 100 MG capsule Take 1 capsule (100 mg total) by mouth 2 (two) times daily. 12/22/16   Sater, Pearletha Furl, MD  amLODipine (NORVASC) 10 MG tablet Take 10 mg by mouth daily.    [provider]  atorvastatin (LIPITOR) 20 MG tablet Take 20 mg by mouth daily.    [provider]  estradiol (ESTRACE) 0.5 MG tablet Take 0.5 mg by mouth daily.    [provider]  metFORMIN (GLUMETZA) 500 MG (MOD) 24 hr tablet Take 500 mg by mouth 2 (two) times daily with a meal.    [provider]  Multiple Vitamin (MULTIVITAMIN) capsule Take 1 capsule by mouth daily.    [provider]  omeprazole (PRILOSEC OTC) 20 MG tablet Take 20 mg by mouth daily.    [provider]  UNABLE TO FIND Lagerstroemia Speciosa leaf extract    [provider]  UNABLE TO FIND Incromega [phone]  (fish oils)    [provider]     Allergies:    No Known Allergies   Physical Exam:   Vitals  Blood pressure (!) 191/85, pulse 100, temperature 98.6 F (37 C), temperature source Oral, resp. rate 18, SpO2 98 %.   1. General alert female sitting in bed in no apparent distress  2.  Significantly confused, able to follow commands, unable to answer questions appropriately, she is awake,  alert, oriented x1   3. No F.N deficits, ALL C.Nerves Intact, Strength 5/5 all 4 extremities, Sensation intact all 4 extremities, Plantars down going.  4. Ears and Eyes appear Normal, Conjunctivae clear, PERRLA. Moist Oral Mucosa.  With significant discoloration at the edge of her tongue, and lips  5. Supple Neck, No JVD, No cervical lymphadenopathy appriciated, No Carotid Bruits.  6. Symmetrical Chest wall movement, Good air movement bilaterally, CTAB.  7. RRR, No Gallops, Rubs or Murmurs, No Parasternal Heave.  8. Positive Bowel Sounds, Abdomen Soft, No tenderness, No organomegaly appriciated,No rebound -guarding or rigidity.  9.  No Cyanosis, Normal Skin Turgor, No Skin Rash or Bruise.  10. Good muscle tone,  joints appear normal , no effusions, Normal ROM.  11. No Palpable Lymph Nodes in Neck or Axillae     Data Review:    CBC Recent Labs  Lab 12/19/18 1719  WBC 10.5  HGB 13.4  HCT 42.6  PLT 250  MCV 87.1  MCH 27.4  MCHC 31.5  RDW 13.3  LYMPHSABS 1.3  MONOABS 0.4  EOSABS 0.0  BASOSABS 0.1   ------------------------------------------------------------------------------------------------------------------  Chemistries  Recent Labs  Lab 12/19/18 1719  NA 140  K 3.5  CL 107  CO2 22  GLUCOSE 168*  BUN 21  CREATININE 1.09*  CALCIUM 9.3  AST 24  ALT 23  ALKPHOS 117  BILITOT 0.6   ------------------------------------------------------------------------------------------------------------------ CrCl cannot be calculated (Unknown ideal weight.). ------------------------------------------------------------------------------------------------------------------ No results for input(s): TSH, T4TOTAL, T3FREE, THYROIDAB in the last 72 hours.  Invalid input(s): FREET3  Coagulation profile Recent Labs  Lab 12/19/18 1719  INR 1.0   ------------------------------------------------------------------------------------------------------------------- No results  for input(s): DDIMER in the last 72 hours. -------------------------------------------------------------------------------------------------------------------  Cardiac Enzymes No results for input(s): CKMB, TROPONINI, MYOGLOBIN in the last 168 hours.  Invalid input(s): CK ------------------------------------------------------------------------------------------------------------------ No results found for: BNP   ---------------------------------------------------------------------------------------------------------------  Urinalysis    Component Value Date/Time   COLORURINE YELLOW 11/19/2009 1055   APPEARANCEUR CLEAR 11/19/2009 1055   LABSPEC 1.013 11/19/2009 1055   PHURINE 7.0 11/19/2009 1055   GLUCOSEU NEGATIVE 11/19/2009 1055  HGBUR NEGATIVE 11/19/2009 1055   BILIRUBINUR NEGATIVE 11/19/2009 1055   KETONESUR NEGATIVE 11/19/2009 1055   PROTEINUR NEGATIVE 11/19/2009 1055   UROBILINOGEN 0.2 11/19/2009 1055   NITRITE NEGATIVE 11/19/2009 1055   LEUKOCYTESUR  11/19/2009 1055    NEGATIVE MICROSCOPIC NOT DONE ON URINES WITH NEGATIVE PROTEIN, BLOOD, LEUKOCYTES, NITRITE, OR GLUCOSE <1000 mg/dL.    ----------------------------------------------------------------------------------------------------------------   Imaging Results:    Ct Head Wo Contrast  Result Date: 12/19/2018 CLINICAL DATA:  Altered mental status. Multiple sclerosis. EXAM: CT HEAD WITHOUT CONTRAST TECHNIQUE: Contiguous axial images were obtained from the base of the skull through the vertex without intravenous contrast. COMPARISON:  Brain MR dated 04/17/2015. FINDINGS: Brain: Mild-to-moderate enlargement of the ventricles and subarachnoid spaces. Mild-to-moderate patchy white matter low density in both cerebral hemispheres. 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 No intracranial hemorrhage, mass lesion or CT evidence of acute infarction. Vascular: No hyperdense vessel or unexpected calcification. Skull: Normal.  Negative for fracture or focal lesion. Sinuses/Orbits: Unremarkable. Other: None. IMPRESSION: 1. No acute abnormality. 2. 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 3. Mildly progressive atrophy and chronic small vessel white matter ischemic changes and possible changes related to the patient's multiple sclerosis. Electronically Signed   By: Beckie Salts M.D.   On: 12/19/2018 17:50    My personal review of EKG: Rhythm NSR, Rate  110 /min, QTc 481 , no Acute ST changes   Assessment & Plan:    Active Problems:   Diabetes mellitus with diabetic neuropathy (HCC)   Multiple sclerosis (HCC)   GERD   History of optic neuritis   Ataxic gait   Essential tremor   History of myelitis   Hypertensive urgency  Acute metabolic encephalopathy -Presents with significant confusion, usually awake alert x3, still driving, currently very poor cognition, alert x1, this is most likely due to hypertensive urgency/press, CT head with no acute findings, no focal deficits to indicate a mild obtain MRI brain for further evaluation. -No evidence of infectious process, will obtain UA  Hypertensive urgency -Patient most likely having press syndrome, resumed her home medication, will aim for gradual improvement of blood pressure, initially pressure 220/111, most recent 184/80 after receiving 10 mg of IV labetalol, and 10 g IV otherwise in, she is resumed on her home dose amlodipine,, continue with PRN hydralazine, if no significant improvement will need nitro drip.   Patient with some significant bruising/discoloration in her tongue and lips -I have discussed with her son, this need to be followed as an outpatient by her PCP  Diabetes mellitus -Hold metformin and continue with insulin sliding scale  Hyperlipidemia -Continue with home dose statin  History of MS/unsteady gait/essential tremors -PT consult when more stable  GERD -Continue with PPI   DVT Prophylaxis Heparin - SCDs  AM Labs Ordered, also  please review Full Orders  Family Communication: Admission, patients condition and plan of care including tests being ordered have been discussed with the patient and son who indicate understanding and agree with the plan and Code Status.  Code Status Full  Likely DC to  Home  Condition GUARDED    Consults called: None  Admission status:  INPATIENT  Time spent in minutes : 60 MINUTES   Huey Bienenstock M.D on 12/19/2018 at 7:36 PM  Between 7am to 7pm - Pager - [phone]. After 7pm go to [email or web address] - password [facility]  [facility] - Office  [phone]

## 2018-12-19 NOTE — ED Triage Notes (Signed)
Per son: Pt has altered mental status. Son went to check on her and she was confused, she couldn't find her purse, didn't know what glasses were. Pt unable to tell this RN what her last name is. Son unsure of last seen normal. Pt is usually independent, alert and oriented X 4. Pt does have MS and uses a cane to walk.

## 2018-12-20 ENCOUNTER — Other Ambulatory Visit: Payer: Self-pay

## 2018-12-20 DIAGNOSIS — G250 Essential tremor: Secondary | ICD-10-CM

## 2018-12-20 DIAGNOSIS — K219 Gastro-esophageal reflux disease without esophagitis: Secondary | ICD-10-CM

## 2018-12-20 DIAGNOSIS — R260 Ataxic gait: Secondary | ICD-10-CM

## 2018-12-20 DIAGNOSIS — R4182 Altered mental status, unspecified: Secondary | ICD-10-CM

## 2018-12-20 LAB — CBC
HCT: 37.6 % (ref 36.0–46.0)
Hemoglobin: 12.2 g/dL (ref 12.0–15.0)
MCH: 27.7 pg (ref 26.0–34.0)
MCHC: 32.4 g/dL (ref 30.0–36.0)
MCV: 85.5 fL (ref 80.0–100.0)
NRBC: 0 % (ref 0.0–0.2)
Platelets: 254 10*3/uL (ref 150–400)
RBC: 4.4 MIL/uL (ref 3.87–5.11)
RDW: 13.6 % (ref 11.5–15.5)
WBC: 8.4 10*3/uL (ref 4.0–10.5)

## 2018-12-20 LAB — URINALYSIS, ROUTINE W REFLEX MICROSCOPIC
Bilirubin Urine: NEGATIVE
Glucose, UA: NEGATIVE mg/dL
Hgb urine dipstick: NEGATIVE
Ketones, ur: NEGATIVE mg/dL
Leukocytes,Ua: NEGATIVE
Nitrite: NEGATIVE
Protein, ur: NEGATIVE mg/dL
Specific Gravity, Urine: 1.012 (ref 1.005–1.030)
pH: 5 (ref 5.0–8.0)

## 2018-12-20 LAB — RAPID URINE DRUG SCREEN, HOSP PERFORMED
Amphetamines: NOT DETECTED
Barbiturates: NOT DETECTED
Benzodiazepines: NOT DETECTED
Cocaine: NOT DETECTED
Opiates: NOT DETECTED
Tetrahydrocannabinol: NOT DETECTED

## 2018-12-20 LAB — GLUCOSE, CAPILLARY
Glucose-Capillary: 150 mg/dL — ABNORMAL HIGH (ref 70–99)
Glucose-Capillary: 161 mg/dL — ABNORMAL HIGH (ref 70–99)
Glucose-Capillary: 119 mg/dL — ABNORMAL HIGH (ref 70–99)
Glucose-Capillary: 171 mg/dL — ABNORMAL HIGH (ref 70–99)

## 2018-12-20 LAB — BASIC METABOLIC PANEL
Anion gap: 12 (ref 5–15)
BUN: 20 mg/dL (ref 8–23)
CALCIUM: 9.2 mg/dL (ref 8.9–10.3)
CO2: 22 mmol/L (ref 22–32)
Chloride: 106 mmol/L (ref 98–111)
Creatinine, Ser: 1.07 mg/dL — ABNORMAL HIGH (ref 0.44–1.00)
GFR calc non Af Amer: 51 mL/min — ABNORMAL LOW (ref >60)
GFR, EST AFRICAN AMERICAN: 59 mL/min — AB (ref >60)
Glucose, Bld: 140 mg/dL — ABNORMAL HIGH (ref 70–99)
Potassium: 3.7 mmol/L (ref 3.5–5.1)
Sodium: 140 mmol/L (ref 135–145)

## 2018-12-20 MED ORDER — LABETALOL HCL 5 MG/ML IV SOLN
10.0000 mg | INTRAVENOUS | Status: DC | PRN
  Administered 2018-12-20 – 2018-12-22 (×2): 10 mg via INTRAVENOUS
  Filled 2018-12-20 (×2): qty 4

## 2018-12-20 NOTE — Plan of Care (Signed)

## 2018-12-20 NOTE — TOC Initial Note (Signed)
Transition of Care [facility]) - Initial/Assessment Note    Patient Details  Name: Janice Little MRN: [id number] Date of Birth: 09/06/1946  [facility] CM/SW Contact:    Kermit Balo, RN Phone Number: 12/20/2018, 1:33 PM  Clinical Narrative:                 Pt's son is travel nurse working in [place]. He can provide some assistance but she is hesitant to ask.   Expected Discharge Plan: Home w Home Health Services Barriers to Discharge: Continued Medical Work up   Patient Goals and CMS Choice Patient states their goals for this hospitalization and ongoing recovery are:: to have her mind be clearer      Expected Discharge Plan and Services Expected Discharge Plan: Home w Home Health Services   Discharge Planning Services: CM Consult   Living arrangements for the past 2 months: Apartment(condo--ground level)                          Prior Living Arrangements/Services Living arrangements for the past 2 months: Apartment(condo--ground level) Lives with:: Self Patient language and need for interpreter reviewed:: Yes(no needs) Do you feel safe going back to the place where you live?: Yes      Need for Family Participation in Patient Care: Yes (Comment)(intermittent supervision recommended)   Current home services: DME(cane/ walker) Criminal Activity/Legal Involvement Pertinent to Current Situation/Hospitalization: No - Comment as needed  Activities of Daily Living      Permission Sought/Granted                  Emotional Assessment Appearance:: Appears stated age Attitude/Demeanor/Rapport: Gracious Affect (typically observed): Accepting, Appropriate, Pleasant Orientation: : Oriented to Self, Oriented to Place   Psych Involvement: No (comment)  Admission diagnosis:  Hypertensive emergency [I16.1] Altered mental status, unspecified altered mental status type [R41.82] Patient Active Problem List   Diagnosis Date Noted  . Hypertensive urgency  12/19/2018  . History of myelitis 12/26/2017  . History of optic neuritis 04/02/2015  . Diplopia 04/02/2015  . Urinary incontinence 04/02/2015  . Other fatigue 04/02/2015  . 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 Numbness 04/02/2015  . Ataxic gait 04/02/2015  . Essential tremor 04/02/2015  . PRIMARY HYPERPARATHYROIDISM 02/01/2008  . OSTEOPOROSIS 02/01/2008  . Diabetes mellitus with diabetic neuropathy (HCC) 01/31/2008  . HYPERLIPIDEMIA 01/31/2008  . Multiple sclerosis (HCC) 01/31/2008  . Optic neuritis 01/31/2008  . HYPERTENSION 01/31/2008  . GERD 01/31/2008   PCP:  Juluis Rainier, MD Pharmacy:   [facility] [address], [place] - [address] [place] [address] Phone: [phone] Fax: [phone]  [facility] - [place], [place] - [address] [facility] [address] [place] [address] [place] [address] Phone: [phone] Fax: [phone]     Social Determinants of Health (SDOH) Interventions  Medications come in the mail. She uses pill box but was having trouble filling it yesterday. Normally she doesn't have any issues.  Pt drives currently.  Readmission Risk Interventions No flowsheet data found.

## 2018-12-20 NOTE — Evaluation (Signed)
Physical Therapy Evaluation Patient Details Name: Janice Little MRN: [id number] DOB: Nov 10, 1945 Today's Date: 12/20/2018   History of Present Illness  Janice Little is an 73 y.o. female with past medical history of multiple sclerosis, hypertension, diabetes mellitus, hyperlipidemia brought to emergency department after her son found patient very confused. In the ED pt's BP was in the 220 systolic range.  Imaging did not show any abnormal acute findings.  Clinical Impression  Pt admitted with/for AMS, suspected due to hypertensive crisis.  Pt needing min guard to supervision for basic mobility.  She is likely moving toward her functional baseline.  Pt currently limited functionally due to the problems listed below.  (see problems list.)  Pt will benefit from PT to maximize function and safety to be able to get home safely with limited available assist.     Follow Up Recommendations Home health PT;Supervision - Intermittent    Equipment Recommendations  None recommended by PT;Other (comment)(TBA for safety items)    Recommendations for Other Services       Precautions / Restrictions Precautions Precautions: Fall      Mobility  Bed Mobility               General bed mobility comments: pt up in the chair on arrival  Transfers Overall transfer level: Needs assistance Equipment used: Straight cane Transfers: Sit to/from Stand Sit to Stand: Supervision         General transfer comment: cues for better hand placement  Ambulation/Gait Ambulation/Gait assistance: Supervision Gait Distance (Feet): 400 Feet Assistive device: Straight cane Gait Pattern/deviations: Step-through pattern Gait velocity: slower without ability to change speeds significantly Gait velocity interpretation: <1.8 ft/sec, indicate of risk for recurrent falls General Gait Details: generally steady with consistent use of the cane, but accasional episodes of scrubing her toes that cause deviation  with forward and right stepping to recover.  Pt also just takes wandering steps right to regain stability.  Stairs Stairs: Yes Stairs assistance: Min guard Stair Management: One rail Left;Alternating pattern;Forwards Number of Stairs: 3 General stair comments: pt was mildly unsteady with cane and rail.  She has no rail at her home  Wheelchair Mobility    Modified Rankin (Stroke Patients Only)       Balance Overall balance assessment: Needs assistance   Sitting balance-Leahy Scale: Good     Standing balance support: Single extremity supported;No upper extremity supported Standing balance-Leahy Scale: Fair Standing balance comment: pt looks mildly unsteady with AD though able to stand without assist                 Standardized Balance Assessment Standardized Balance Assessment : Dynamic Gait Index   Dynamic Gait Index Level Surface: Mild Impairment Change in Gait Speed: Moderate Impairment Gait with Horizontal Head Turns: Mild Impairment Gait with Vertical Head Turns: Mild Impairment Gait and Pivot Turn: Mild Impairment Step Over Obstacle: Mild Impairment Step Around Obstacles: Mild Impairment Steps: Mild Impairment Total Score: 15       Pertinent Vitals/Pain Pain Assessment: Faces Faces Pain Scale: No hurt Pain Intervention(s): Monitored during session    Home Living Family/patient expects to be discharged to:: Private residence Living Arrangements: Alone Available Help at Discharge: Family;Available PRN/intermittently(sounds relatively infrequently) Type of Home: Other(Comment)(condo) Home Access: Stairs to enter Entrance Stairs-Rails: None Entrance Stairs-Number of Steps: 3 Home Layout: One level Home Equipment: Cane - single point      Prior Function Level of Independence: Independent with assistive device(s)  Hand Dominance        Extremity/Trunk Assessment   Upper Extremity Assessment Upper Extremity Assessment:  Overall WFL for tasks assessed    Lower Extremity Assessment Lower Extremity Assessment: Overall WFL for tasks assessed;Generalized weakness    Cervical / Trunk Assessment Cervical / Trunk Assessment: (mildly weak abdominals)  Communication   Communication: No difficulties  Cognition Arousal/Alertness: Awake/alert Behavior During Therapy: WFL for tasks assessed/performed Overall Cognitive Status: Impaired/Different from baseline Area of Impairment: Problem solving                             Problem Solving: Slow processing(unable to use directional cues to find her room.)        General Comments General comments (skin integrity, edema, etc.): pt is at a risk for falls based on DGI and reports falls trending to inside the home getting groceries from the door to where she places them.    Exercises     Assessment/Plan    PT Assessment Patient needs continued PT services  PT Problem List Decreased strength;Decreased activity tolerance;Decreased balance;Decreased mobility;Decreased coordination;Decreased knowledge of use of DME       PT Treatment Interventions DME instruction;Gait training;Stair training;Functional mobility training;Therapeutic activities;Balance training;Patient/family education    PT Goals (Current goals can be found in the Care Plan section)  Acute Rehab PT Goals Patient Stated Goal: back home PT Goal Formulation: With patient Time For Goal Achievement: 12/27/18 Potential to Achieve Goals: Good    Frequency Min 3X/week   Barriers to discharge        Co-evaluation               AM-PAC PT "6 Clicks" Mobility  Outcome Measure Help needed turning from your back to your side while in a flat bed without using bedrails?: A Little Help needed moving from lying on your back to sitting on the side of a flat bed without using bedrails?: A Little Help needed moving to and from a bed to a chair (including a wheelchair)?: A Little Help  needed standing up from a chair using your arms (e.g., wheelchair or bedside chair)?: A Little Help needed to walk in hospital room?: A Little Help needed climbing 3-5 steps with a railing? : A Little 6 Click Score: 18    End of Session   Activity Tolerance: Patient tolerated treatment well Patient left: in chair;with call bell/phone within reach Nurse Communication: Mobility status PT Visit Diagnosis: Unsteadiness on feet (R26.81);Muscle weakness (generalized) (M62.81);History of falling (Z91.81)    Time: 1751-0258 PT Time Calculation (min) (ACUTE ONLY): 34 min   Charges:   PT Evaluation $PT Eval Moderate Complexity: 1 Mod PT Treatments $Gait Training: 8-22 mins        12/20/2018  [place] Bing, PT Acute Rehabilitation Services [phone]  (pager) [phone]  (office)  Janice Little 12/20/2018, 11:21 AM

## 2018-12-20 NOTE — Progress Notes (Signed)
PROGRESS NOTE    Janice Little  QIO:[id number] DOB: 06/16/1946 DOA: 12/19/2018 PCP: Juluis Rainier, MD   Brief Narrative:  HPI on 12/19/2018 by Dr. Huey Bienenstock Janice Little  is a 73 y.o. female, past medical history of MS, ambulatory with cane, still driving, hypertension, diabetes mellitus, hyperlipidemia, she was brought by her son secondary to altered mental status, son is a Engineer, civil (consulting), he visited her every few weeks, she was noted to be very confused to him today so he brought her to ED, never had slurred speech, facial droop, focal deficits, tingling or numbness, does not know when this started, for that has been constant, since he saw her today, usually awake alert x3, still driving, currently she is significantly confused, only alert to name. - in ED CT head with no acute finding, significant for old CVA, blood pressure elevated at 220/111, x-ray with no acute findings, UA still pending, no significant lab abnormalities, I was called to admit  Assessment & Plan   Acute metabolic encephalopathy -Resolved. Possibly related to hypertension -Currently she is alert and oriented x3 -CT head showed no acute findings -No focal deficits on exam -MRI: No acute intracranial normality.  No findings to suggest press or other acute finding.  Moderate cerebral white matter changes, microvascular ischemic disease.  Multiple superimposed remote lacunar infarcts.  Changes are mildly progressed relative to 2016. -pending UA -Chest Xray unremarkable for infection -Discussed MRI results with neurology, Dr. Amada Jupiter- recommended outpatient follow up and use of Aspirin 81mg  if no contraindications  Hypertensive urgency -BP initially 220/111 -Continue amlodipine -Continue hydralazine PRN  Tongue/lip discoloration/bruising -Patient will need to follow-up with her PCP  Diabetes mellitus, type II -Metformin held, continue insulin sliding scale and CBG monitoring  Hyperlipidemia -Continue  statin  History of MS/unsteady gait/essential tremors -PT consulted recommended home health -Follow-up with neurology  GERD -Continue PPI   DVT Prophylaxis  heparin  Code Status: Full  Family Communication: None at bedside  Disposition Plan: Admitted. Pending improvement in HTN  Consultants Neurology   Procedures  None  Antibiotics   Anti-infectives (From admission, onward)   None      Subjective:   Janice Little seen and examined today.  Patient denies chest pain, shortness of breath, abdominal pain, N/V/D/C.    Objective:   Vitals:   12/20/18 0200 12/20/18 0337 12/20/18 0750 12/20/18 1243  BP: (!) 152/65 (!) 169/75 (!) 179/78 (!) 164/69  Pulse: (!) 105 99 87 85  Resp: 18 18 17 15   Temp:  98.6 F (37 C) 98.5 F (36.9 C) 99.3 F (37.4 C)  TempSrc:  Oral Oral Oral  SpO2: 98% 98% 97% 98%  Weight:        Intake/Output Summary (Last 24 hours) at 12/20/2018 1521 Last data filed at 12/20/2018 1200 Gross per 24 hour  Intake 539 ml  Output -  Net 539 ml   Filed Weights   12/19/18 2000  Weight: 78 kg    Exam  General: Well developed, well nourished, NAD, appears stated age  HEENT: NCAT, mucous membranes moist.   Cardiovascular: S1 S2 auscultated, RRR, no murmur   Respiratory: Clear to auscultation bilaterally with equal chest rise  Abdomen: Soft, nontender, nondistended, + bowel sounds  Extremities: warm dry without cyanosis clubbing or edema  Neuro: AAOx3, nonfocal  Psych: Normal affect and demeanor   Data Reviewed: I have personally reviewed following labs and imaging studies  CBC: Recent Labs  Lab 12/19/18 1719 12/20/18 0425  WBC 10.5 8.4  NEUTROABS 8.7*  --   HGB 13.4 12.2  HCT 42.6 37.6  MCV 87.1 85.5  PLT 250 254   Basic Metabolic Panel: Recent Labs  Lab 12/19/18 1719 12/20/18 0425  NA 140 140  K 3.5 3.7  CL 107 106  CO2 22 22  GLUCOSE 168* 140*  BUN 21 20  CREATININE 1.09* 1.07*  CALCIUM 9.3 9.2   GFR: CrCl  cannot be calculated (Unknown ideal weight.). Liver Function Tests: Recent Labs  Lab 12/19/18 1719  AST 24  ALT 23  ALKPHOS 117  BILITOT 0.6  PROT 6.1*  ALBUMIN 3.8   No results for input(s): LIPASE, AMYLASE in the last 168 hours. No results for input(s): AMMONIA in the last 168 hours. Coagulation Profile: Recent Labs  Lab 12/19/18 1719  INR 1.0   Cardiac Enzymes: No results for input(s): CKTOTAL, CKMB, CKMBINDEX, TROPONINI in the last 168 hours. BNP (last 3 results) No results for input(s): PROBNP in the last 8760 hours. HbA1C: No results for input(s): HGBA1C in the last 72 hours. CBG: Recent Labs  Lab 12/19/18 1717 12/19/18 2327 12/20/18 0610 12/20/18 1117  GLUCAP 152* 155* 150* 161*   Lipid Profile: No results for input(s): CHOL, HDL, LDLCALC, TRIG, CHOLHDL, LDLDIRECT in the last 72 hours. Thyroid Function Tests: No results for input(s): TSH, T4TOTAL, FREET4, T3FREE, THYROIDAB in the last 72 hours. Anemia Panel: No results for input(s): VITAMINB12, FOLATE, FERRITIN, TIBC, IRON, RETICCTPCT in the last 72 hours. Urine analysis:    Component Value Date/Time   COLORURINE YELLOW 11/19/2009 1055   APPEARANCEUR CLEAR 11/19/2009 1055   LABSPEC 1.013 11/19/2009 1055   PHURINE 7.0 11/19/2009 1055   GLUCOSEU NEGATIVE 11/19/2009 1055   HGBUR NEGATIVE 11/19/2009 1055   BILIRUBINUR NEGATIVE 11/19/2009 1055   KETONESUR NEGATIVE 11/19/2009 1055   PROTEINUR NEGATIVE 11/19/2009 1055   UROBILINOGEN 0.2 11/19/2009 1055   NITRITE NEGATIVE 11/19/2009 1055   LEUKOCYTESUR  11/19/2009 1055    NEGATIVE MICROSCOPIC NOT DONE ON URINES WITH NEGATIVE PROTEIN, BLOOD, LEUKOCYTES, NITRITE, OR GLUCOSE <1000 mg/dL.   Sepsis Labs: @LABRCNTIP (procalcitonin:4,lacticidven:4)  )No results found for this or any previous visit (from the past 240 hour(s)).    Radiology Studies: Ct Head Wo Contrast  Result Date: 12/19/2018 CLINICAL DATA:  Altered mental status. Multiple sclerosis. EXAM: CT  HEAD WITHOUT CONTRAST TECHNIQUE: Contiguous axial images were obtained from the base of the skull through the vertex without intravenous contrast. COMPARISON:  Brain MR dated 04/17/2015. FINDINGS: Brain: Mild-to-moderate enlargement of the ventricles and subarachnoid spaces. Mild-to-moderate patchy white matter low density in both cerebral hemispheres. 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 No intracranial hemorrhage, mass lesion or CT evidence of acute infarction. Vascular: No hyperdense vessel or unexpected calcification. Skull: Normal. Negative for fracture or focal lesion. Sinuses/Orbits: Unremarkable. Other: None. IMPRESSION: 1. No acute abnormality. 2. 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 3. Mildly progressive atrophy and chronic small vessel white matter ischemic changes and possible changes related to the patient's multiple sclerosis. Electronically Signed   By: Beckie Salts M.D.   On: 12/19/2018 17:50   Mr Brain Wo Contrast (neuro Protocol)  Result Date: 12/19/2018 CLINICAL DATA:  Initial evaluation for acute altered mental status. History of multiple sclerosis. EXAM: MRI HEAD WITHOUT CONTRAST TECHNIQUE: Multiplanar, multiecho pulse sequences of the brain and surrounding structures were obtained without intravenous contrast. COMPARISON:  Prior head CT from earlier same day as well as previous brain MRI from 04/17/2015. FINDINGS: Brain: Examination moderately degraded by motion artifact. Diffuse prominence of  the CSF containing spaces compatible generalized age-related cerebral atrophy. Patchy and confluent T2/FLAIR hyperintensity within the periventricular and deep white matter both cerebral hemispheres, likely in part related to history of multiple sclerosis. A degree of chronic microvascular ischemic disease is suspected as well. Overall, changes are progressed relative to 2016. Scattered superimposed remote lacunar infarcts present within left basal ganglia, right thalamus, and  hemispheric cerebral white matter. No convincing abnormal foci of restricted diffusion to suggest acute or subacute ischemia on this motion degraded exam. Few scattered mildly increased foci of diffusion abnormality felt to be most compatible with T2 shine through. No diffusion abnormality to suggest active demyelination. No evidence for acute or chronic intracranial hemorrhage. No imaging findings to suggest pressed. No mass lesion, midline shift or mass effect. No hydrocephalus. No extra-axial fluid collection. Vascular: Major intracranial vascular flow voids maintained. Skull and upper cervical spine: Craniocervical junction within normal limits. Visualized upper cervical spine grossly normal. Bone marrow signal intensity within normal limits. No appreciable scalp soft tissue abnormality. Sinuses/Orbits: Globes orbital soft tissues demonstrate no acute finding. Mild scattered mucosal thickening within the ethmoidal air cells. Paranasal sinuses are otherwise clear. Trace right mastoid effusion noted, of doubtful significance. Inner ear structures normal. Other: None. IMPRESSION: 1. Technically limited exam due to moderate motion degradation. 2. No acute intracranial abnormality. No findings to suggest PRES or other acute finding. 3. Moderate cerebral white matter changes, likely in part due to provided history of multiple sclerosis as well as chronic microvascular ischemic disease. Multiple superimposed remote lacunar infarcts involving the left basal ganglia, right thalamus, and hemispheric cerebral white matter. Changes are mildly progressed relative to 2016. Electronically Signed   By: Rise Mu M.D.   On: 12/19/2018 23:26   Dg Chest Port 1 View  Result Date: 12/19/2018 CLINICAL DATA:  Altered mental status. EXAM: PORTABLE CHEST 1 VIEW COMPARISON:  Chest x-ray dated November 19, 2009. FINDINGS: The heart size and mediastinal contours are within normal limits. Both lungs are clear. The visualized  skeletal structures are unremarkable. IMPRESSION: No active disease. Electronically Signed   By: Obie Dredge M.D.   On: 12/19/2018 19:37     Scheduled Meds: . amantadine  100 mg Oral BID  . amLODipine  10 mg Oral Daily  . atorvastatin  20 mg Oral Daily  . heparin  5,000 Units Subcutaneous Q8H  . insulin aspart  0-9 Units Subcutaneous TID WC  . multivitamin with minerals  1 tablet Oral Daily  . pantoprazole  40 mg Oral Daily  . sodium chloride flush  3 mL Intravenous Q12H   Continuous Infusions:   LOS: 1 day   Time Spent in minutes   30 minutes  Janice Little D.O. on 12/20/2018 at 3:21 PM  Between 7am to 7pm - Please see pager noted on [email or web address]  After 7pm go to [email or web address]  And look for the night coverage person covering for me after hours  [facility] Office  [phone]

## 2018-12-20 NOTE — Progress Notes (Signed)
Subjective: Patient feels much improved; she feels her memory is closer to baseline and has had resolution of headache and nausea. She states she has been compliant with her antihypertensive regimen and has not had any recent trauma or illness.  Objective: Current vital signs: BP (!) 179/78 (BP Location: Left Arm)   Pulse 87   Temp 98.5 F (36.9 C) (Oral)   Resp 17   Wt 78 kg   SpO2 97%   BMI 28.62 kg/m  Vital signs in last 24 hours: Temp:  [98.5 F (36.9 C)-98.8 F (37.1 C)] 98.5 F (36.9 C) (03/19 0750) Pulse Rate:  [87-107] 87 (03/19 0750) Resp:  [14-18] 17 (03/19 0750) BP: (152-220)/(65-111) 179/78 (03/19 0750) SpO2:  [94 %-98 %] 97 % (03/19 0750) Weight:  [78 kg] 78 kg (03/18 2000)  Intake/Output from previous day: No intake/output data recorded. Intake/Output this shift: Total I/O In: 299 [P.O.:299] Out: -  Nutritional status:  Diet Order            Diet heart healthy/carb modified Room service appropriate? No; Fluid consistency: Thin  Diet effective now              Neurologic Exam: Mental Status: Alert, oriented, thought content appropriate.  Speech fluent without evidence of aphasia.  Cranial Nerves: II: Visual fields grossly normal III,IV, VI: ptosis not present, extra-ocular motions intact, bilaterally pupils equal, round, reactive to light V,VII: smile symmetric, facial light touch sensation normal bilaterally VIII: hearing normal bilaterally IX,X: soft palate rises symmetrically XI: bilateral shoulder shrug XII: midline tongue extension Motor: Right : Upper extremity   5/5   Lower extremity   5/5    Left:     Upper extremity   5/5  Lower extremity   5/5     Tone and bulk:normal tone throughout; no atrophy noted. Bil UE intention tremor  Sensory: light touch intact throughout, bilaterally Deep Tendon Reflexes: 2+ and symmetric throughout Plantars: Right: downgoing   Left: downgoing Cerebellar: normal finger-to-nose  Lab Results: Results  for orders placed or performed during the hospital encounter of 12/19/18 (from the past 48 hour(s))  CBG monitoring, ED     Status: Abnormal   Collection Time: 12/19/18  5:17 PM  Result Value Ref Range   Glucose-Capillary 152 (H) 70 - 99 mg/dL  Comprehensive metabolic panel     Status: Abnormal   Collection Time: 12/19/18  5:19 PM  Result Value Ref Range   Sodium 140 135 - 145 mmol/L   Potassium 3.5 3.5 - 5.1 mmol/L   Chloride 107 98 - 111 mmol/L   CO2 22 22 - 32 mmol/L   Glucose, Bld 168 (H) 70 - 99 mg/dL   BUN 21 8 - 23 mg/dL   Creatinine, Ser 0.35 (H) 0.44 - 1.00 mg/dL   Calcium 9.3 8.9 - 46.5 mg/dL   Total Protein 6.1 (L) 6.5 - 8.1 g/dL   Albumin 3.8 3.5 - 5.0 g/dL   AST 24 15 - 41 U/L   ALT 23 0 - 44 U/L   Alkaline Phosphatase 117 38 - 126 U/L   Total Bilirubin 0.6 0.3 - 1.2 mg/dL   GFR calc non Af Amer 50 (L) >60 mL/min   GFR calc Af Amer 58 (L) >60 mL/min   Anion gap 11 5 - 15    Comment: Performed at [facility], [address] [place], [place] [address]  CBC with Differential     Status: Abnormal   Collection Time: 12/19/18  5:19 PM  Result Value Ref Range   WBC 10.5 4.0 - 10.5 K/uL   RBC 4.89 3.87 - 5.11 MIL/uL   Hemoglobin 13.4 12.0 - 15.0 g/dL   HCT 49.1 79.1 - 50.5 %   MCV 87.1 80.0 - 100.0 fL   MCH 27.4 26.0 - 34.0 pg   MCHC 31.5 30.0 - 36.0 g/dL   RDW 69.7 94.8 - 01.6 %   Platelets 250 150 - 400 K/uL   nRBC 0.0 0.0 - 0.2 %   Neutrophils Relative % 83 %   Neutro Abs 8.7 (H) 1.7 - 7.7 K/uL   Lymphocytes Relative 12 %   Lymphs Abs 1.3 0.7 - 4.0 K/uL   Monocytes Relative 3 %   Monocytes Absolute 0.4 0.1 - 1.0 K/uL   Eosinophils Relative 0 %   Eosinophils Absolute 0.0 0.0 - 0.5 K/uL   Basophils Relative 1 %   Basophils Absolute 0.1 0.0 - 0.1 K/uL   Immature Granulocytes 1 %   Abs Immature Granulocytes 0.08 (H) 0.00 - 0.07 K/uL    Comment: Performed at [facility], [address], [place], [place] [address]  Protime-INR     Status: None    Collection Time: 12/19/18  5:19 PM  Result Value Ref Range   Prothrombin Time 13.1 11.4 - 15.2 seconds   INR 1.0 0.8 - 1.2    Comment: (NOTE) INR goal varies based on device and disease states. Performed at [facility], [address], [place], [place] [address]   I-Stat Troponin, ED (not at [facility])     Status: None   Collection Time: 12/19/18  5:20 PM  Result Value Ref Range   Troponin i, poc 0.01 0.00 - 0.08 ng/mL   Comment 3            Comment: Due to the release kinetics of cTnI, a negative result within the first hours of the onset of symptoms does not rule out myocardial infarction with certainty. If myocardial infarction is still suspected, repeat the test at appropriate intervals.   Glucose, capillary     Status: Abnormal   Collection Time: 12/19/18 11:27 PM  Result Value Ref Range   Glucose-Capillary 155 (H) 70 - 99 mg/dL  Basic metabolic panel     Status: Abnormal   Collection Time: 12/20/18  4:25 AM  Result Value Ref Range   Sodium 140 135 - 145 mmol/L   Potassium 3.7 3.5 - 5.1 mmol/L   Chloride 106 98 - 111 mmol/L   CO2 22 22 - 32 mmol/L   Glucose, Bld 140 (H) 70 - 99 mg/dL   BUN 20 8 - 23 mg/dL   Creatinine, Ser 8.67 (H) 0.44 - 1.00 mg/dL   Calcium 9.2 8.9 - 54.4 mg/dL   GFR calc non Af Amer 51 (L) >60 mL/min   GFR calc Af Amer 59 (L) >60 mL/min   Anion gap 12 5 - 15    Comment: Performed at [facility], [address], [place], [place] [address]  CBC     Status: None   Collection Time: 12/20/18  4:25 AM  Result Value Ref Range   WBC 8.4 4.0 - 10.5 K/uL   RBC 4.40 3.87 - 5.11 MIL/uL   Hemoglobin 12.2 12.0 - 15.0 g/dL   HCT 07.1 21.9 - 75.8 %   MCV 85.5 80.0 - 100.0 fL   MCH 27.7 26.0 - 34.0 pg   MCHC 32.4 30.0 - 36.0 g/dL  RDW 13.6 11.5 - 15.5 %   Platelets 254 150 - 400 K/uL   nRBC 0.0 0.0 - 0.2 %    Comment: Performed at [facility], [address] [place], [place] [address]  Glucose, capillary     Status: Abnormal   Collection  Time: 12/20/18  6:10 AM  Result Value Ref Range   Glucose-Capillary 150 (H) 70 - 99 mg/dL  Glucose, capillary     Status: Abnormal   Collection Time: 12/20/18 11:17 AM  Result Value Ref Range   Glucose-Capillary 161 (H) 70 - 99 mg/dL    No results found for this or any previous visit (from the past 240 hour(s)).  Lipid Panel No results for input(s): CHOL, TRIG, HDL, CHOLHDL, VLDL, LDLCALC in the last 72 hours.  Studies/Results: Ct Head Wo Contrast  Result Date: 12/19/2018 CLINICAL DATA:  Altered mental status. Multiple sclerosis. EXAM: CT HEAD WITHOUT CONTRAST TECHNIQUE: Contiguous axial images were obtained from the base of the skull through the vertex without intravenous contrast. COMPARISON:  Brain MR dated 04/17/2015. FINDINGS: Brain: Mild-to-moderate enlargement of the ventricles and subarachnoid spaces. Mild-to-moderate patchy white matter low density in both cerebral hemispheres. 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 No intracranial hemorrhage, mass lesion or CT evidence of acute infarction. Vascular: No hyperdense vessel or unexpected calcification. Skull: Normal. Negative for fracture or focal lesion. Sinuses/Orbits: Unremarkable. Other: None. IMPRESSION: 1. No acute abnormality. 2. 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 3. Mildly progressive atrophy and chronic small vessel white matter ischemic changes and possible changes related to the patient's multiple sclerosis. Electronically Signed   By: Beckie Salts M.D.   On: 12/19/2018 17:50   Mr Brain Wo Contrast (neuro Protocol)  Result Date: 12/19/2018 CLINICAL DATA:  Initial evaluation for acute altered mental status. History of multiple sclerosis. EXAM: MRI HEAD WITHOUT CONTRAST TECHNIQUE: Multiplanar, multiecho pulse sequences of the brain and surrounding structures were obtained without intravenous contrast. COMPARISON:  Prior head CT from earlier same day as well as previous brain MRI from 04/17/2015. FINDINGS: Brain:  Examination moderately degraded by motion artifact. Diffuse prominence of the CSF containing spaces compatible generalized age-related cerebral atrophy. Patchy and confluent T2/FLAIR hyperintensity within the periventricular and deep white matter both cerebral hemispheres, likely in part related to history of multiple sclerosis. A degree of chronic microvascular ischemic disease is suspected as well. Overall, changes are progressed relative to 2016. Scattered superimposed remote lacunar infarcts present within left basal ganglia, right thalamus, and hemispheric cerebral white matter. No convincing abnormal foci of restricted diffusion to suggest acute or subacute ischemia on this motion degraded exam. Few scattered mildly increased foci of diffusion abnormality felt to be most compatible with T2 shine through. No diffusion abnormality to suggest active demyelination. No evidence for acute or chronic intracranial hemorrhage. No imaging findings to suggest pressed. No mass lesion, midline shift or mass effect. No hydrocephalus. No extra-axial fluid collection. Vascular: Major intracranial vascular flow voids maintained. Skull and upper cervical spine: Craniocervical junction within normal limits. Visualized upper cervical spine grossly normal. Bone marrow signal intensity within normal limits. No appreciable scalp soft tissue abnormality. Sinuses/Orbits: Globes orbital soft tissues demonstrate no acute finding. Mild scattered mucosal thickening within the ethmoidal air cells. Paranasal sinuses are otherwise clear. Trace right mastoid effusion noted, of doubtful significance. Inner ear structures normal. Other: None. IMPRESSION: 1. Technically limited exam due to moderate motion degradation. 2. No acute intracranial abnormality. No findings to suggest PRES or other acute finding. 3. Moderate cerebral  white matter changes, likely in part due to provided history of multiple sclerosis as well as chronic microvascular  ischemic disease. Multiple superimposed remote lacunar infarcts involving the left basal ganglia, right thalamus, and hemispheric cerebral white matter. Changes are mildly progressed relative to 2016. Electronically Signed   By: Rise Mu M.D.   On: 12/19/2018 23:26   Dg Chest Port 1 View  Result Date: 12/19/2018 CLINICAL DATA:  Altered mental status. EXAM: PORTABLE CHEST 1 VIEW COMPARISON:  Chest x-ray dated November 19, 2009. FINDINGS: The heart size and mediastinal contours are within normal limits. Both lungs are clear. The visualized skeletal structures are unremarkable. IMPRESSION: No active disease. Electronically Signed   By: Obie Dredge M.D.   On: 12/19/2018 19:37    Medications:  Scheduled: . amantadine  100 mg Oral BID  . amLODipine  10 mg Oral Daily  . atorvastatin  20 mg Oral Daily  . heparin  5,000 Units Subcutaneous Q8H  . insulin aspart  0-9 Units Subcutaneous TID WC  . multivitamin with minerals  1 tablet Oral Daily  . pantoprazole  40 mg Oral Daily  . sodium chloride flush  3 mL Intravenous Q12H    Assessment/Plan: Mr. Gillenwater is a 73yo female with PMH of MS, HTN, T2DM, HLD who presented to [facility] with encephalopathy thought to be hypertensive. CT head negative for hemorrhage. MRI brain with chronic MS lesions, chronic lacunar infarcts (right thalamus, left basal ganglia), but without evidence of acute CVA or PRES. She is close to her baseline per patient and son.  Plan: --continue gradual normalization of BP --neurology will sign off, please call if questions arise   LOS: 1 day   Nyra Market, MD PGY3 12/20/2018  11:32 AM

## 2018-12-20 NOTE — Consult Note (Signed)
Requesting Physician: Dr. Rodena Medin    Chief Complaint: Confusion  History obtained from: Patient and Chart     HPI:                                                                                                                                       Janice Little is an 73 y.o. female with past medical history of multiple sclerosis, hypertension, diabetes mellitus, hyperlipidemia brought to emergency department after her son found patient very confused.  Last known normal several days ago.  Patient denies any symptoms of slurred speech, weakness in one arm or leg more than normal or any new sensory symptoms.  In the emergency department, it was noted the patient had blood pressure in the 220 systolic range.  Stat CT head was performed which showed no acute findings.  Patient was admitted to hospitalist team and MRI brain was performed which showed no acute stroke.    Past Medical History:  Diagnosis Date  . Chronic kidney disease (CKD), stage II (mild)   . Chronic pain   . DM type 2 (diabetes mellitus, type 2) (HCC)   . Fatty liver    per CT  . GERD (gastroesophageal reflux disease)   . History of hepatitis as a child    age 57 (jaundice)  . HTN (hypertension)   . Hyperlipidemia   . MS (multiple sclerosis) (HCC)   . Obesity   . Osteopenia   . Primary hyperparathyroidism (HCC)    s/p surgery removal of parathyroid (Dr. Sharl Ma, Dr. Gerrit Friends)  . Vitamin D deficiency     Past Surgical History:  Procedure Laterality Date  . ANTERIOR CRUCIATE LIGAMENT REPAIR Left   . VESICOVAGINAL FISTULA CLOSURE W/ TAH      Family History  Problem Relation Age of Onset  . Mental illness Mother   . COPD Father   . Lung cancer Father    Social History:  reports that she has never smoked. She has never used smokeless tobacco. She reports that she does not drink alcohol or use drugs.  Allergies: No Known Allergies  Medications:                                                                                                                         I reviewed home medications   ROS:  14 systems reviewed and negative except above    Examination:                                                                                                      General: Appears well-developed  Psych: Affect appropriate to situation Eyes: No scleral injection HENT: No OP obstrucion Head: Normocephalic.  Cardiovascular: Normal rate and regular rhythm.  Respiratory: Effort normal and breath sounds normal to anterior ascultation GI: Soft.  No distension. There is no tenderness.  Skin: WDI    Neurological Examination Mental Status: Awake but not oriented to place, time or reason why she is in the hospital.  Speech fluent without evidence of aphasia. Able to follow simple commands without difficulty. Cranial Nerves: II: Visual fields grossly normal,  III,IV, VI: ptosis not present, extra-ocular motions intact bilaterally, pupils equal, round, reactive to light and accommodation V,VII: smile symmetric, facial light touch sensation normal bilaterally VIII: hearing normal bilaterally IX,X: uvula rises symmetrically XI: bilateral shoulder shrug XII: midline tongue extension Motor: Right : Upper extremity   5/5    Left:     Upper extremity   5/5  Lower extremity   5/5     Lower extremity   5/5 Tone and bulk:normal tone throughout; no atrophy noted Sensory: Pinprick and light touch intact throughout, bilaterally Deep Tendon Reflexes: 2+ and symmetric throughout, no ankle clonus noted bilaterally Plantars: Right: downgoing   Left: downgoing Cerebellar: No gross ataxia noted on examination Gait: Deferred     Lab Results: Basic Metabolic Panel: Recent Labs  Lab 12/19/18 1719  NA 140  K 3.5  CL 107  CO2 22  GLUCOSE 168*  BUN 21  CREATININE 1.09*  CALCIUM  9.3    CBC: Recent Labs  Lab 12/19/18 1719  WBC 10.5  NEUTROABS 8.7*  HGB 13.4  HCT 42.6  MCV 87.1  PLT 250    Coagulation Studies: Recent Labs    12/19/18 1719  LABPROT 13.1  INR 1.0    Imaging: Ct Head Wo Contrast  Result Date: 12/19/2018 CLINICAL DATA:  Altered mental status. Multiple sclerosis. EXAM: CT HEAD WITHOUT CONTRAST TECHNIQUE: Contiguous axial images were obtained from the base of the skull through the vertex without intravenous contrast. COMPARISON:  Brain MR dated 04/17/2015. FINDINGS: Brain: Mild-to-moderate enlargement of the ventricles and subarachnoid spaces. Mild-to-moderate patchy white matter low density in both cerebral hemispheres. 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 No intracranial hemorrhage, mass lesion or CT evidence of acute infarction. Vascular: No hyperdense vessel or unexpected calcification. Skull: Normal. Negative for fracture or focal lesion. Sinuses/Orbits: Unremarkable. Other: None. IMPRESSION: 1. No acute abnormality. 2. 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 3. Mildly progressive atrophy and chronic small vessel white matter ischemic changes and possible changes related to the patient's multiple sclerosis. Electronically Signed   By: Beckie Salts M.D.   On: 12/19/2018 17:50   Mr Brain Wo Contrast (neuro Protocol)  Result Date: 12/19/2018 CLINICAL DATA:  Initial evaluation for acute altered mental status. History of multiple sclerosis. EXAM: MRI HEAD WITHOUT CONTRAST TECHNIQUE: Multiplanar, multiecho pulse  sequences of the brain and surrounding structures were obtained without intravenous contrast. COMPARISON:  Prior head CT from earlier same day as well as previous brain MRI from 04/17/2015. FINDINGS: Brain: Examination moderately degraded by motion artifact. Diffuse prominence of the CSF containing spaces compatible generalized age-related cerebral atrophy. Patchy and confluent T2/FLAIR hyperintensity within the periventricular  and deep white matter both cerebral hemispheres, likely in part related to history of multiple sclerosis. A degree of chronic microvascular ischemic disease is suspected as well. Overall, changes are progressed relative to 2016. Scattered superimposed remote lacunar infarcts present within left basal ganglia, right thalamus, and hemispheric cerebral white matter. No convincing abnormal foci of restricted diffusion to suggest acute or subacute ischemia on this motion degraded exam. Few scattered mildly increased foci of diffusion abnormality felt to be most compatible with T2 shine through. No diffusion abnormality to suggest active demyelination. No evidence for acute or chronic intracranial hemorrhage. No imaging findings to suggest pressed. No mass lesion, midline shift or mass effect. No hydrocephalus. No extra-axial fluid collection. Vascular: Major intracranial vascular flow voids maintained. Skull and upper cervical spine: Craniocervical junction within normal limits. Visualized upper cervical spine grossly normal. Bone marrow signal intensity within normal limits. No appreciable scalp soft tissue abnormality. Sinuses/Orbits: Globes orbital soft tissues demonstrate no acute finding. Mild scattered mucosal thickening within the ethmoidal air cells. Paranasal sinuses are otherwise clear. Trace right mastoid effusion noted, of doubtful significance. Inner ear structures normal. Other: None. IMPRESSION: 1. Technically limited exam due to moderate motion degradation. 2. No acute intracranial abnormality. No findings to suggest PRES or other acute finding. 3. Moderate cerebral white matter changes, likely in part due to provided history of multiple sclerosis as well as chronic microvascular ischemic disease. Multiple superimposed remote lacunar infarcts involving the left basal ganglia, right thalamus, and hemispheric cerebral white matter. Changes are mildly progressed relative to 2016. Electronically Signed   By:  Rise Mu M.D.   On: 12/19/2018 23:26   Dg Chest Port 1 View  Result Date: 12/19/2018 CLINICAL DATA:  Altered mental status. EXAM: PORTABLE CHEST 1 VIEW COMPARISON:  Chest x-ray dated November 19, 2009. FINDINGS: The heart size and mediastinal contours are within normal limits. Both lungs are clear. The visualized skeletal structures are unremarkable. IMPRESSION: No active disease. Electronically Signed   By: Obie Dredge M.D.   On: 12/19/2018 19:37     ASSESSMENT AND PLAN  73 year old female with past medical history of MS brought to the emergency room for confusion noted by her son.  No focal motor or sensory deficits, MRI brain negative for acute stroke.  Patient's blood pressures in the 180s to 220 systolic range, so likely etiology for encephalopathy is hypertensive emergency.  No obvious signs for infection, UA and UDS still pending. My brain was performed without contrast: No acute stroke/pres.  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   Hypertensive emergency with encephalopathy Acute metabolic encephalopathy   Recommendations Gradual reduction of blood pressure to normotension, no need for permissive hypertension MRI brain: Already performed negative for acute infarct, shows chronic MS lesions Follow-up on UA/UDS.   Bessie Livingood [facility] Pager Number [phone]

## 2018-12-21 LAB — GLUCOSE, CAPILLARY
GLUCOSE-CAPILLARY: 130 mg/dL — AB (ref 70–99)
Glucose-Capillary: 149 mg/dL — ABNORMAL HIGH (ref 70–99)
Glucose-Capillary: 148 mg/dL — ABNORMAL HIGH (ref 70–99)
Glucose-Capillary: 126 mg/dL — ABNORMAL HIGH (ref 70–99)

## 2018-12-21 MED ORDER — LISINOPRIL 20 MG PO TABS
20.0000 mg | ORAL_TABLET | Freq: Every day | ORAL | Status: DC
  Administered 2018-12-21 – 2018-12-24 (×4): 20 mg via ORAL
  Filled 2018-12-21 (×4): qty 1

## 2018-12-21 MED ORDER — LIP MEDEX EX OINT
TOPICAL_OINTMENT | CUTANEOUS | Status: DC | PRN
  Administered 2018-12-21: 15:00:00 via TOPICAL
  Filled 2018-12-21: qty 7

## 2018-12-21 MED ORDER — LISINOPRIL 10 MG PO TABS: 10.0000 mg | ORAL_TABLET | Freq: Every day | ORAL | Status: DC

## 2018-12-21 MED ORDER — AMLODIPINE BESYLATE 5 MG PO TABS: 5.0000 mg | ORAL_TABLET | Freq: Every day | ORAL | Status: DC

## 2018-12-21 NOTE — Progress Notes (Signed)
PT Cancellation Note  Patient Details Name: JANELLY FAZEL MRN: [id number] DOB: [birth year]   Cancelled Treatment:    Reason Eval/Treat Not Completed: Patient declined, no reason specified.  I'm so sleepy.  Can we do it tomorrow? 12/21/2018  Dickson Bing, PT [facility] [phone]  (pager) [phone]  (office)   Eliseo Gum Arynn Armand 12/21/2018, 6:05 PM

## 2018-12-21 NOTE — Progress Notes (Signed)
PROGRESS NOTE    Janice Little  GMW:[id number] DOB: 01/20/1946 DOA: 12/19/2018 PCP: Juluis Rainier, MD   Brief Narrative:  HPI on 12/19/2018 by Dr. Huey Bienenstock Janice Little  is a 73 y.o. female, past medical history of MS, ambulatory with cane, still driving, hypertension, diabetes mellitus, hyperlipidemia, she was brought by her son secondary to altered mental status, son is a Engineer, civil (consulting), he visited her every few weeks, she was noted to be very confused to him today so he brought her to ED, never had slurred speech, facial droop, focal deficits, tingling or numbness, does not know when this started, for that has been constant, since he saw her today, usually awake alert x3, still driving, currently she is significantly confused, only alert to name. - in ED CT head with no acute finding, significant for old CVA, blood pressure elevated at 220/111, x-ray with no acute findings, UA still pending, no significant lab abnormalities, I was called to admit  Assessment & Plan   Acute metabolic encephalopathy -Resolved. Possibly related to hypertension -Currently she is alert and oriented x3 -CT head showed no acute findings -No focal deficits on exam -MRI: No acute intracranial normality.  No findings to suggest press or other acute finding.  Moderate cerebral white matter changes, microvascular ischemic disease.  Multiple superimposed remote lacunar infarcts.  Changes are mildly progressed relative to 2016. -UA unremarkable for infection -Chest Xray unremarkable for infection -Discussed MRI results with neurology, Dr. Amada Jupiter- recommended outpatient follow up and use of Aspirin 81mg  if no contraindications  Hypertensive urgency -BP initially 220/111 -Discussed with patient, she was taken off of amlodipine due to lower extremity swelling.  Was on lisinopril at home.  -Will restart lisinopril however increased dose to 20 mg daily and continue to monitor -Continue hydralazine PRN   Tongue/lip discoloration/bruising -Patient will need to follow-up with her PCP  Diabetes mellitus, type II -Metformin held, continue insulin sliding scale and CBG monitoring  Hyperlipidemia -Continue statin  History of MS/unsteady gait/essential tremors -PT consulted recommended home health -Follow-up with neurology  GERD -Continue PPI  ?Dysphagia with vomiting -Just occurred this morning. -Speech therapy consulted and appreciated   DVT Prophylaxis  heparin  Code Status: Full  Family Communication: None at bedside  Disposition Plan: Admitted. Pending improvement in HTN  Consultants Neurology   Procedures  None  Antibiotics   Anti-infectives (From admission, onward)   None      Subjective:   Modupe Hurtado seen and examined today.  Patient denies chest pain, shortness of breath, abdominal pain, diarrhea constipation, dizziness or headache.  We discussed her home situation and she states that she has things everywhere.  Objective:   Vitals:   12/21/18 0322 12/21/18 0518 12/21/18 0732 12/21/18 0915  BP: (!) 191/88 (!) 178/81 (!) 177/86 (!) 158/98  Pulse: 89 84 86   Resp: 19  16   Temp: 98 F (36.7 C)  97.9 F (36.6 C)   TempSrc: Oral  Oral   SpO2: 97%  99%   Weight:      Height:        Intake/Output Summary (Last 24 hours) at 12/21/2018 1100 Last data filed at 12/21/2018 0917 Gross per 24 hour  Intake 715 ml  Output 500 ml  Net 215 ml   Filed Weights   12/19/18 2000 12/20/18 1538  Weight: 78 kg 78 kg   Exam  General: Well developed, chronically ill-appearing, NAD  HEENT: NCAT, mucous membranes moist.   Cardiovascular: S1 S2 auscultated, RRR,  no murmur  Respiratory: Clear to auscultation bilaterally  Abdomen: Soft, nontender, nondistended, + bowel sounds  Extremities: warm dry without cyanosis clubbing or edema  Neuro: AAOx3, nonfocal, + tremor  Psych: Flat, however appropriate   Data Reviewed: I have personally reviewed  following labs and imaging studies  CBC: Recent Labs  Lab 12/19/18 1719 12/20/18 0425  WBC 10.5 8.4  NEUTROABS 8.7*  --   HGB 13.4 12.2  HCT 42.6 37.6  MCV 87.1 85.5  PLT 250 254   Basic Metabolic Panel: Recent Labs  Lab 12/19/18 1719 12/20/18 0425  NA 140 140  K 3.5 3.7  CL 107 106  CO2 22 22  GLUCOSE 168* 140*  BUN 21 20  CREATININE 1.09* 1.07*  CALCIUM 9.3 9.2   GFR: Estimated Creatinine Clearance: 48.6 mL/min (A) (by C-G formula based on SCr of 1.07 mg/dL (H)). Liver Function Tests: Recent Labs  Lab 12/19/18 1719  AST 24  ALT 23  ALKPHOS 117  BILITOT 0.6  PROT 6.1*  ALBUMIN 3.8   No results for input(s): LIPASE, AMYLASE in the last 168 hours. No results for input(s): AMMONIA in the last 168 hours. Coagulation Profile: Recent Labs  Lab 12/19/18 1719  INR 1.0   Cardiac Enzymes: No results for input(s): CKTOTAL, CKMB, CKMBINDEX, TROPONINI in the last 168 hours. BNP (last 3 results) No results for input(s): PROBNP in the last 8760 hours. HbA1C: No results for input(s): HGBA1C in the last 72 hours. CBG: Recent Labs  Lab 12/20/18 1117 12/20/18 1717 12/20/18 2113 12/21/18 0623 12/21/18 1050  GLUCAP 161* 119* 171* 149* 130*   Lipid Profile: No results for input(s): CHOL, HDL, LDLCALC, TRIG, CHOLHDL, LDLDIRECT in the last 72 hours. Thyroid Function Tests: No results for input(s): TSH, T4TOTAL, FREET4, T3FREE, THYROIDAB in the last 72 hours. Anemia Panel: No results for input(s): VITAMINB12, FOLATE, FERRITIN, TIBC, IRON, RETICCTPCT in the last 72 hours. Urine analysis:    Component Value Date/Time   COLORURINE YELLOW 12/20/2018 1850   APPEARANCEUR HAZY (A) 12/20/2018 1850   LABSPEC 1.012 12/20/2018 1850   PHURINE 5.0 12/20/2018 1850   GLUCOSEU NEGATIVE 12/20/2018 1850   HGBUR NEGATIVE 12/20/2018 1850   BILIRUBINUR NEGATIVE 12/20/2018 1850   KETONESUR NEGATIVE 12/20/2018 1850   PROTEINUR NEGATIVE 12/20/2018 1850   UROBILINOGEN 0.2 11/19/2009  1055   NITRITE NEGATIVE 12/20/2018 1850   LEUKOCYTESUR NEGATIVE 12/20/2018 1850   Sepsis Labs: @LABRCNTIP (procalcitonin:4,lacticidven:4)  )No results found for this or any previous visit (from the past 240 hour(s)).    Radiology Studies: Ct Head Wo Contrast  Result Date: 12/19/2018 CLINICAL DATA:  Altered mental status. Multiple sclerosis. EXAM: CT HEAD WITHOUT CONTRAST TECHNIQUE: Contiguous axial images were obtained from the base of the skull through the vertex without intravenous contrast. COMPARISON:  Brain MR dated 04/17/2015. FINDINGS: Brain: Mild-to-moderate enlargement of the ventricles and subarachnoid spaces. Mild-to-moderate patchy white matter low density in both cerebral hemispheres. 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 No intracranial hemorrhage, mass lesion or CT evidence of acute infarction. Vascular: No hyperdense vessel or unexpected calcification. Skull: Normal. Negative for fracture or focal lesion. Sinuses/Orbits: Unremarkable. Other: None. IMPRESSION: 1. No acute abnormality. 2. 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 3. Mildly progressive atrophy and chronic small vessel white matter ischemic changes and possible changes related to the patient's multiple sclerosis. Electronically Signed   By: Beckie Salts M.D.   On: 12/19/2018 17:50   Mr Brain Wo Contrast (neuro Protocol)  Result Date: 12/19/2018 CLINICAL DATA:  Initial evaluation  for acute altered mental status. History of multiple sclerosis. EXAM: MRI HEAD WITHOUT CONTRAST TECHNIQUE: Multiplanar, multiecho pulse sequences of the brain and surrounding structures were obtained without intravenous contrast. COMPARISON:  Prior head CT from earlier same day as well as previous brain MRI from 04/17/2015. FINDINGS: Brain: Examination moderately degraded by motion artifact. Diffuse prominence of the CSF containing spaces compatible generalized age-related cerebral atrophy. Patchy and confluent T2/FLAIR hyperintensity  within the periventricular and deep white matter both cerebral hemispheres, likely in part related to history of multiple sclerosis. A degree of chronic microvascular ischemic disease is suspected as well. Overall, changes are progressed relative to 2016. Scattered superimposed remote lacunar infarcts present within left basal ganglia, right thalamus, and hemispheric cerebral white matter. No convincing abnormal foci of restricted diffusion to suggest acute or subacute ischemia on this motion degraded exam. Few scattered mildly increased foci of diffusion abnormality felt to be most compatible with T2 shine through. No diffusion abnormality to suggest active demyelination. No evidence for acute or chronic intracranial hemorrhage. No imaging findings to suggest pressed. No mass lesion, midline shift or mass effect. No hydrocephalus. No extra-axial fluid collection. Vascular: Major intracranial vascular flow voids maintained. Skull and upper cervical spine: Craniocervical junction within normal limits. Visualized upper cervical spine grossly normal. Bone marrow signal intensity within normal limits. No appreciable scalp soft tissue abnormality. Sinuses/Orbits: Globes orbital soft tissues demonstrate no acute finding. Mild scattered mucosal thickening within the ethmoidal air cells. Paranasal sinuses are otherwise clear. Trace right mastoid effusion noted, of doubtful significance. Inner ear structures normal. Other: None. IMPRESSION: 1. Technically limited exam due to moderate motion degradation. 2. No acute intracranial abnormality. No findings to suggest PRES or other acute finding. 3. Moderate cerebral white matter changes, likely in part due to provided history of multiple sclerosis as well as chronic microvascular ischemic disease. Multiple superimposed remote lacunar infarcts involving the left basal ganglia, right thalamus, and hemispheric cerebral white matter. Changes are mildly progressed relative to 2016.  Electronically Signed   By: Rise Mu M.D.   On: 12/19/2018 23:26   Dg Chest Port 1 View  Result Date: 12/19/2018 CLINICAL DATA:  Altered mental status. EXAM: PORTABLE CHEST 1 VIEW COMPARISON:  Chest x-ray dated November 19, 2009. FINDINGS: The heart size and mediastinal contours are within normal limits. Both lungs are clear. The visualized skeletal structures are unremarkable. IMPRESSION: No active disease. Electronically Signed   By: Obie Dredge M.D.   On: 12/19/2018 19:37     Scheduled Meds: . amantadine  100 mg Oral BID  . atorvastatin  20 mg Oral Daily  . heparin  5,000 Units Subcutaneous Q8H  . insulin aspart  0-9 Units Subcutaneous TID WC  . lisinopril  20 mg Oral Daily  . multivitamin with minerals  1 tablet Oral Daily  . pantoprazole  40 mg Oral Daily  . sodium chloride flush  3 mL Intravenous Q12H   Continuous Infusions:   LOS: 2 days   Time Spent in minutes   30 minutes  Linas Stepter D.O. on 12/21/2018 at 11:00 AM  Between 7am to 7pm - Please see pager noted on [email or web address]  After 7pm go to [email or web address]  And look for the night coverage person covering for me after hours  [facility] Office  [phone]

## 2018-12-21 NOTE — Evaluation (Addendum)
Clinical/Bedside Swallow Evaluation Patient Details  Name: Janice Little MRN: [id number] Date of Birth: Nov 04, 1945  Today's Date: 12/21/2018 Time: SLP Start Time (ACUTE ONLY): 1050 SLP Stop Time (ACUTE ONLY): 1135 SLP Time Calculation (min) (ACUTE ONLY): 45 min  Past Medical History:  Past Medical History:  Diagnosis Date  . Chronic kidney disease (CKD), stage II (mild)   . Chronic pain   . DM type 2 (diabetes mellitus, type 2) (HCC)   . Fatty liver    per CT  . GERD (gastroesophageal reflux disease)   . History of hepatitis as a child    age 39 (jaundice)  . HTN (hypertension)   . Hyperlipidemia   . MS (multiple sclerosis) (HCC)   . Obesity   . Osteopenia   . Primary hyperparathyroidism (HCC)    s/p surgery removal of parathyroid (Dr. Sharl Ma, Dr. Gerrit Friends)  . Vitamin D deficiency    Past Surgical History:  Past Surgical History:  Procedure Laterality Date  . ANTERIOR CRUCIATE LIGAMENT REPAIR Left   . VESICOVAGINAL FISTULA CLOSURE W/ TAH     HPI:  73 yo female adm to [facility] after spouse found her confused.  PMH + for MS, imaging study negative for PRES or acute event.  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  She also has h/o reflux but denies this being an issue in hospital.  Initially passed [facility] swallow screen but today she presented with globus, nausea and vomiting.  She denies choking but RN states she complained of choking prior to vomiting incident.     Assessment / Plan / Recommendation Clinical Impression  Pt presents with symptoms of mild dysphagia - causing her to sense food residuals in throat during her meal.  Cues to drink liquids throughout meal helpful to clear sensation.  Later in session as SLP observed pt eating, she is already naturally following solids with liquids.  She also admits sensing residuals in throat at times if she does not consume liquids.  Aware of need to take small bites.  Pt with pna "years ago"  and states swallow function is the same as prior to admission.  Advised her to follow precautions and contact MD if pnas reoccur or she has more problems swallowing.  Suspect chronic issue due to her MS that she manages.       Of note, pt observed to continue to try to drink tea from an empty container when water was near her.  Suspect cognitive deficits may be baseline due to her MS.  Recommend follow up [facility] for dysphagia and cog eval.   SLP Visit Diagnosis: Dysphagia, unspecified (R13.10);Dysphagia, pharyngoesophageal phase (R13.14)    Aspiration Risk  Mild aspiration risk    Diet Recommendation Regular;Thin liquid   Liquid Administration via: Cup;Straw Medication Administration: Whole meds with puree(start and follow with liquids) Supervision: Patient able to self feed Compensations: Slow rate;Small sips/bites;Follow solids with liquid Postural Changes: Seated upright at 90 degrees;Remain upright for at least 30 minutes after po intake    Other  Recommendations Oral Care Recommendations: Oral care BID   Follow up Recommendations Home health SLP      Frequency and Duration min 1 x/week  1 week       Prognosis Barriers to Reach Goals: Cognitive deficits      Swallow Study   General Date of Onset: 12/21/18 HPI: 73 yo female adm to [facility] after spouse found her confused.  PMH + for MS, imaging  study negative for PRES or acute event.  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  She also has h/o reflux but denies this being an issue in hospital.  Initially passed [facility] swallow screen but today she presented with globus, nausea and vomiting.  She denies choking but RN states she complained of choking prior to vomiting incident.   Type of Study: Bedside Swallow Evaluation Diet Prior to this Study: Regular;Thin liquids Temperature Spikes Noted: No Respiratory Status: Room air History of Recent Intubation: No Behavior/Cognition:  Alert;Cooperative Oral Cavity Assessment: Other (comment)(appearance of ? bruising/redness- petchial areas on right buccal region and palate bilaterally.  Pt denies pain to SLP palpation and upon her inspection of the picture, states this is normal for her.  ) Oral Care Completed by SLP: No Oral Cavity - Dentition: Adequate natural dentition;Other (Comment)(some dentition missing) Vision: Functional for self-feeding Self-Feeding Abilities: Able to feed self Patient Positioning: Upright in chair Baseline Vocal Quality: Normal Volitional Cough: Weak Volitional Swallow: Able to elicit    Oral/Motor/Sensory Function Overall Oral Motor/Sensory Function: Within functional limits   Ice Chips Ice chips: Not tested   Thin Liquid Other Comments: throat clear after [facility] screen, no difficulties with liquids during meals    Nectar Thick Nectar Thick Liquid: Not tested   Honey Thick Honey Thick Liquid: Not tested   Puree Puree: Within functional limits Presentation: Self Fed;Spoon   Solid     Solid: Impaired Presentation: Self Fed Other Comments: sensaation of solid food lodging, abated with liquid swallow = occuring x3 during meal, use of liquid swallows clinically helpful      Janice Little 12/21/2018,12:18 PM  Donavan Burnet, MS [facility] SLP Acute Rehab Services Pager [phone] Office [phone]

## 2018-12-21 NOTE — Evaluation (Addendum)
Occupational Therapy Evaluation Patient Details Name: Janice Little MRN: [id number] DOB: [birth year] Today's Date: 12/21/2018    History of Present Illness Janice Little is an 73 y.o. female with past medical history of multiple sclerosis, hypertension, diabetes mellitus, hyperlipidemia brought to emergency department after her son found patient very confused. In the ED pt's BP was in the 220 systolic range.  Imaging did not show any abnormal acute findings.   Clinical Impression   This 73 y/o female presents with the above. At baseline pt reports she is mod independent with ADL, iADL and functional mobility using cane; reports she lives alone. Pt presenting with decreased dynamic balance, generalized weakness, impaired cognition impacting her functional performance. Pt requiring minA for room level functional mobility using cane, minA for toileting and standing grooming ADL, modA for LB ADL. Pt demonstrating word finding difficulties and slower processing during session. She will benefit from continued OT services; given pt's current functional/cognitive status recommend follow up therapy services and 24hr supervision/assist if returning home at discharge. If 24hr unavailable may need to consider ST SNF stay to maximize her safety and independence with ADL, iADL, and mobility. Will follow.     Follow Up Recommendations  Home health OT;Supervision/Assistance - 24 hour(if 24hr unavailable, may need to consider SNF)    Equipment Recommendations  3 in 1 bedside commode(vs shower chair)           Precautions / Restrictions Precautions Precautions: Fall Restrictions Weight Bearing Restrictions: No      Mobility Bed Mobility Overal bed mobility: Needs Assistance Bed Mobility: Supine to Sit     Supine to sit: Supervision     General bed mobility comments: for general safety, no physical assist required  Transfers Overall transfer level: Needs assistance Equipment used:  Straight cane Transfers: Sit to/from Stand Sit to Stand: Min guard;Min assist         General transfer comment: minguard for safety and immediate standing balance from EOB; minA to rise from lower toilet height    Balance Overall balance assessment: Needs assistance   Sitting balance-Leahy Scale: Good     Standing balance support: Single extremity supported;No upper extremity supported Standing balance-Leahy Scale: Poor Standing balance comment: pt unsteady this AM, use of external assist for increased support                           ADL either performed or assessed with clinical judgement   ADL Overall ADL's : Needs assistance/impaired Eating/Feeding: Modified independent;Sitting Eating/Feeding Details (indicate cue type and reason): pt reports difficulty swallowing this AM, RN aware and to follow up  Grooming: Minimal assistance;Standing;Wash/dry hands Grooming Details (indicate cue type and reason): VCs to use soap; minA standing balance Upper Body Bathing: Sitting;Min guard   Lower Body Bathing: Minimal assistance;Sit to/from stand   Upper Body Dressing : Min guard;Set up;Sitting   Lower Body Dressing: Moderate assistance;Sit to/from stand Lower Body Dressing Details (indicate cue type and reason): pt able to reach and adjust socks with increased effort; minA standing balance Toilet Transfer: Minimal assistance;Ambulation;Regular Toilet   Toileting- Clothing Manipulation and Hygiene: Minimal assistance;Sit to/from stand Toileting - Clothing Manipulation Details (indicate cue type and reason): minA for balance while pt performs clothing management (mesh underwear), assist for gown management; pt completing peri-care via lateral leans while seated     Functional mobility during ADLs: Minimal assistance;Cane General ADL Comments: pt unsteady this AM requiring consistent minA for mobility, discussed use  of RW vs SPC for increased stability; cognitive impairments  noted     Vision         Perception     Praxis      Pertinent Vitals/Pain Pain Assessment: No/denies pain     Hand Dominance     Extremity/Trunk Assessment Upper Extremity Assessment Upper Extremity Assessment: Generalized weakness;RUE deficits/detail RUE Deficits / Details: occasional tremor noted in RUE while holding cup   Lower Extremity Assessment Lower Extremity Assessment: Defer to PT evaluation   Cervical / Trunk Assessment Cervical / Trunk Assessment: (mildly weak abdominals)   Communication Communication Communication: No difficulties   Cognition Arousal/Alertness: Awake/alert Behavior During Therapy: WFL for tasks assessed/performed Overall Cognitive Status: Impaired/Different from baseline Area of Impairment: Problem solving;Memory                     Memory: Decreased short-term memory       Problem Solving: Slow processing;Requires verbal cues;Requires tactile cues General Comments: pt with word finding difficulties and STM deficits noted; slower processing; pt does demonstrate awareness of imbalances asking when will it get better   General Comments       Exercises     Shoulder Instructions      Home Living Family/patient expects to be discharged to:: Private residence Living Arrangements: Alone Available Help at Discharge: Family;Available PRN/intermittently(sounds relatively infrequently) Type of Home: Other(Comment)(condo) Home Access: Stairs to enter Entrance Stairs-Number of Steps: 3 Entrance Stairs-Rails: None Home Layout: One level     Bathroom Shower/Tub: Chief Strategy Officer: Standard     Home Equipment: Cane - single point;Walker - 2 wheels          Prior Functioning/Environment Level of Independence: Independent with assistive device(s)        Comments: use of SPC, was still driving        OT Problem List: Decreased strength;Decreased range of motion;Decreased activity tolerance;Impaired  balance (sitting and/or standing);Decreased cognition;Decreased knowledge of use of DME or AE      OT Treatment/Interventions: Self-care/ADL training;Therapeutic exercise;Neuromuscular education;DME and/or AE instruction;Therapeutic activities;Cognitive remediation/compensation;Patient/family education;Balance training    OT Goals(Current goals can be found in the care plan section) Acute Rehab OT Goals Patient Stated Goal: back home OT Goal Formulation: With patient Time For Goal Achievement: 01/04/19 Potential to Achieve Goals: Good  OT Frequency: Min 2X/week   Barriers to D/C:            Co-evaluation              AM-PAC OT "6 Clicks" Daily Activity     Outcome Measure Help from another person eating meals?: None Help from another person taking care of personal grooming?: A Little Help from another person toileting, which includes using toliet, bedpan, or urinal?: A Little Help from another person bathing (including washing, rinsing, drying)?: A Little Help from another person to put on and taking off regular upper body clothing?: None Help from another person to put on and taking off regular lower body clothing?: A Little 6 Click Score: 20   End of Session Equipment Utilized During Treatment: Gait belt;Other (comment)(cane) Nurse Communication: Mobility status  Activity Tolerance: Patient tolerated treatment well Patient left: in chair;with call bell/phone within reach;with chair alarm set  OT Visit Diagnosis: Unsteadiness on feet (R26.81);Muscle weakness (generalized) (M62.81)                Time: 7867-5449 OT Time Calculation (min): 27 min Charges:  OT General Charges $OT Visit:  1 Visit OT Evaluation $OT Eval Moderate Complexity: 1 Mod OT Treatments $Self Care/Home Management : 8-22 mins  Marcy Siren, OT Supplemental Rehabilitation Services Pager [phone] Office [phone]   Orlando Penner 12/21/2018, 9:13 AM

## 2018-12-22 ENCOUNTER — Inpatient Hospital Stay (HOSPITAL_COMMUNITY): Payer: Medicare Other

## 2018-12-22 DIAGNOSIS — Z8669 Personal history of other diseases of the nervous system and sense organs: Secondary | ICD-10-CM

## 2018-12-22 LAB — URINALYSIS, ROUTINE W REFLEX MICROSCOPIC
Bilirubin Urine: NEGATIVE
Glucose, UA: NEGATIVE mg/dL
Hgb urine dipstick: NEGATIVE
Ketones, ur: NEGATIVE mg/dL
LEUKOCYTE UA: NEGATIVE
Nitrite: NEGATIVE
PH: 6 (ref 5.0–8.0)
Protein, ur: NEGATIVE mg/dL
Specific Gravity, Urine: 1.005 (ref 1.005–1.030)

## 2018-12-22 LAB — GLUCOSE, CAPILLARY
GLUCOSE-CAPILLARY: 128 mg/dL — AB (ref 70–99)
Glucose-Capillary: 148 mg/dL — ABNORMAL HIGH (ref 70–99)
Glucose-Capillary: 138 mg/dL — ABNORMAL HIGH (ref 70–99)
Glucose-Capillary: 145 mg/dL — ABNORMAL HIGH (ref 70–99)
Glucose-Capillary: 148 mg/dL — ABNORMAL HIGH (ref 70–99)

## 2018-12-22 MED ORDER — LISINOPRIL 20 MG PO TABS: 20.0000 mg | ORAL_TABLET | Freq: Every day | ORAL | 0 refills | Status: DC

## 2018-12-22 MED ORDER — LISINOPRIL 20 MG PO TABS: 20.0000 mg | ORAL_TABLET | Freq: Once | ORAL | Status: DC

## 2018-12-22 MED ORDER — HYDRALAZINE HCL 25 MG PO TABS: 25.0000 mg | ORAL_TABLET | Freq: Three times a day (TID) | ORAL | 0 refills | Status: DC

## 2018-12-22 MED ORDER — ASPIRIN EC 81 MG PO TBEC: 81.0000 mg | DELAYED_RELEASE_TABLET | Freq: Every day | ORAL | 0 refills | Status: AC

## 2018-12-22 MED ORDER — HYDRALAZINE HCL 25 MG PO TABS
25.0000 mg | ORAL_TABLET | Freq: Three times a day (TID) | ORAL | Status: DC
  Administered 2018-12-22 – 2018-12-25 (×9): 25 mg via ORAL
  Filled 2018-12-22 (×9): qty 1

## 2018-12-22 MED ORDER — TRAMADOL HCL 50 MG PO TABS
50.0000 mg | ORAL_TABLET | Freq: Once | ORAL | Status: AC
  Administered 2018-12-22: 50 mg via ORAL
  Filled 2018-12-22: qty 1

## 2018-12-22 NOTE — Progress Notes (Addendum)
Physical Therapy Treatment Patient Details Name: Janice Little MRN: [id number] DOB: May 15, 1946 Today's Date: 12/22/2018    History of Present Illness Janice Little is an 73 y.o. female with past medical history of multiple sclerosis, hypertension, diabetes mellitus, hyperlipidemia brought to emergency department after her son found patient very confused. In the ED pt's BP was in the 220 systolic range.  Imaging did not show any abnormal acute findings.    PT Comments    Patient seen for mobility progression. Pt presents with significant change in mobility since previous PT session on 3/19. Pt requires mod A for gait/stair training with SPC and min A for gait training with RW. Pt with R lateral lean while sitting EOB and reports "I don't feel right. I feel like I'm falling backwards". RN notified of pt's change in mobility level. Given pt's current mobility level and history of falls recommending SNF for further skilled PT services to maximize independence and safety with mobility prior to d/c home alone.   Follow Up Recommendations  SNF     Equipment Recommendations  None recommended by PT    Recommendations for Other Services       Precautions / Restrictions Precautions Precautions: Fall    Mobility  Bed Mobility Overal bed mobility: Needs Assistance Bed Mobility: Supine to Sit     Supine to sit: Min guard     General bed mobility comments: use of rail  Transfers Overall transfer level: Needs assistance Equipment used: Straight cane Transfers: Sit to/from Stand Sit to Stand: Min assist         General transfer comment: assistance required for balance; pt with posterior LOB when attempting to pull up underwear at EOB and required increased assistance to recover and maintain standing  Ambulation/Gait Ambulation/Gait assistance: Min assist;Mod assist Gait Distance (Feet): (90 ft X 2 trials ) seated rest break required; SOB Assistive device: Straight  cane;Rolling walker (2 wheeled) Gait Pattern/deviations: Step-through pattern;Trunk flexed;Decreased step length - right;Decreased step length - left     General Gait Details: initially ambulated with SPC and then RW for increased stability; pt requires mod A for balance with use of SPC and min A for use of RW; cues for safe use of RW as pt tends to keep bilat UE extended and RW far away especially with turning   Stairs   Stairs assistance: Mod assist Stair Management: Forwards;Step to pattern;Two rails(with cane and HHA) Number of Stairs: 2 General stair comments: mod A for balance to ascend/descend 2 steps simulating home entrance--pt has 3 steps   Wheelchair Mobility    Modified Rankin (Stroke Patients Only)       Balance Overall balance assessment: Needs assistance   Sitting balance-Leahy Scale: Fair Sitting balance - Comments: pt with R lateral lean in sitting and states "I don't feel right. I feel like i'm falling backwards"   Standing balance support: Bilateral upper extremity supported;Single extremity supported Standing balance-Leahy Scale: Poor                              Cognition Arousal/Alertness: Awake/alert Behavior During Therapy: WFL for tasks assessed/performed Overall Cognitive Status: No family/caregiver present to determine baseline cognitive functioning Area of Impairment: Problem solving;Memory                     Memory: Decreased short-term memory       Problem Solving: Slow processing;Requires verbal cues  Exercises      General Comments        Pertinent Vitals/Pain Pain Assessment: No/denies pain    Home Living                      Prior Function            PT Goals (current goals can now be found in the care plan section) Acute Rehab PT Goals Patient Stated Goal: back home Progress towards PT goals: Progressing toward goals    Frequency    Min 3X/week      PT Plan Discharge  plan needs to be updated    Co-evaluation              AM-PAC PT "6 Clicks" Mobility   Outcome Measure  Help needed turning from your back to your side while in a flat bed without using bedrails?: A Little Help needed moving from lying on your back to sitting on the side of a flat bed without using bedrails?: A Little Help needed moving to and from a bed to a chair (including a wheelchair)?: A Lot Help needed standing up from a chair using your arms (e.g., wheelchair or bedside chair)?: A Little Help needed to walk in hospital room?: A Lot Help needed climbing 3-5 steps with a railing? : A Lot 6 Click Score: 15    End of Session Equipment Utilized During Treatment: Gait belt Activity Tolerance: Patient tolerated treatment well Patient left: in chair;with call bell/phone within reach;with chair alarm set Nurse Communication: Mobility status PT Visit Diagnosis: Unsteadiness on feet (R26.81);Muscle weakness (generalized) (M62.81);History of falling (Z91.81)     Time: 1696-7893 PT Time Calculation (min) (ACUTE ONLY): 28 min  Charges:  $Gait Training: 23-37 mins                     Erline Levine, PTA Acute Rehabilitation Services Pager: [phone] Office: [phone]     Carolynne Edouard 12/22/2018, 4:43 PM

## 2018-12-22 NOTE — Discharge Summary (Signed)
Physician Discharge Summary  Janice Little ZOX:[id number] DOB: 1946-01-31 DOA: 12/19/2018  PCP: Juluis Rainier, MD  Admit date: 12/19/2018 Discharge date: 12/22/2018  Time spent: 45 minutes  Recommendations for Outpatient Follow-up:  Patient will be discharged to home with home health physical therapy.  Patient will need to follow up with primary care provider within one week of discharge, discuss blood pressure management.  Follow up with neurology. Patient should continue medications as prescribed.  Patient should follow a heart healthy/carb modified diet.   Discharge Diagnoses:  Acute metabolic encephalopathy Hypertensive urgency Tongue/lip discoloration/bruising Diabetes mellitus, type II Hyperlipidemia History of MS/unsteady gait/essential tremors GERD ?Dysphagia with vomiting  Discharge Condition: Stable  Diet recommendation: heart healthy/carb modified  Filed Weights   12/19/18 2000 12/20/18 1538  Weight: 78 kg 78 kg    History of present illness:  On 12/19/2018 by Dr. Mliss Fritz Elgergawy [name],past medical history of MS, ambulatory with cane, still driving, hypertension, diabetes mellitus, hyperlipidemia, she was brought by her son secondary to altered mental status, son is a Engineer, civil (consulting), he visited her every few weeks, she was noted to be very confused to him today so he brought her to ED, never had slurred speech, facial droop, focal deficits, tingling or numbness, does not know when this started, for that has been constant, since he saw her today, usually awake alert x3, still driving, currently she is significantly confused, only alert to name. - in EDCT head with no acute finding, significant for old CVA, blood pressure elevated at 220/111,x-ray with no acute findings, UA still pending, no significant lab abnormalities, I was called to admit  Hospital Course:  Acute metabolic encephalopathy -Resolved. Possibly related to  hypertension -Currently she is alert and oriented x3 -CT head showed no acute findings -No focal deficits on exam -MRI: No acute intracranial normality.  No findings to suggest press or other acute finding.  Moderate cerebral white matter changes, microvascular ischemic disease.  Multiple superimposed remote lacunar infarcts.  Changes are mildly progressed relative to 2016. -UA unremarkable for infection -Chest Xray unremarkable for infection -Discussed MRI results with neurology, Dr. Amada Jupiter- recommended outpatient follow up and use of Aspirin  if no contraindications  Hypertensive urgency -BP initially 220/111 -Discussed with patient, she was taken off of amlodipine due to lower extremity swelling.  Was on lisinopril at home.  -Continue lisinopril and hydralazine with close outpatient/PCP follow up  Tongue/lip discoloration/bruising -Patient will need to follow-up with her PCP  Diabetes mellitus, type II -Continue metformin  Hyperlipidemia -Continue statin  History of MS/unsteady gait/essential tremors -PT consulted recommended home health -Follow-up with neurology  GERD -Continue PPI  ?Dysphagia with vomiting -Resolved -Speech therapy consulted and appreciated- regular, thin liquid. Instructed to take small bites and follow with liquid  Consultants Neurology   Procedures  None  Discharge Exam: Vitals:   12/22/18 1255 12/22/18 1428  BP: (!) 184/86 (!) 164/85  Pulse:  85  Resp:    Temp:  98.7 F (37.1 C)  SpO2:  96%     General: Well developed, chronically ill-appearing, NAD  HEENT: NCAT, mucous membranes moist.  Neck: Supple  Cardiovascular: S1 S2 auscultated, RRR, no murmur   Respiratory: Clear to auscultation bilaterally   Abdomen: Soft, nontender, nondistended, + bowel sounds  Extremities: warm dry without cyanosis clubbing  Neuro: AAOx3, nonfocal, +tremor  Psych: Pleasant, appropriate mood and affect  Discharge  Instructions Discharge Instructions    Discharge instructions   Complete by:  As directed  Patient will be discharged to home with home health services.  Patient will need to follow up with primary care provider within one week of discharge, discuss blood pressure management and repeat BMP.  Follow up with neurology. Patient should continue medications as prescribed.  Patient should follow a heart healthy/carb modified diet.     Allergies as of 12/22/2018   No Known Allergies     Medication List    STOP taking these medications   amantadine 100 MG capsule Commonly known as:  SYMMETREL   amLODipine 10 MG tablet Commonly known as:  NORVASC     TAKE these medications   aspirin EC 81 MG tablet Take 1 tablet (81 mg total) by mouth daily for 30 days.   atorvastatin 20 MG tablet Commonly known as:  LIPITOR Take 20 mg by mouth daily.   estradiol 0.5 MG tablet Commonly known as:  ESTRACE Take 0.5 mg by mouth daily.   hydrALAZINE 25 MG tablet Commonly known as:  APRESOLINE Take 1 tablet (25 mg total) by mouth every 8 (eight) hours.   Januvia 50 MG tablet Generic drug:  sitaGLIPtin Take 50 mg by mouth daily.   lisinopril 20 MG tablet Commonly known as:  PRINIVIL,ZESTRIL Take 1 tablet (20 mg total) by mouth daily. Start taking on:  December 23, 2018 What changed:    medication strength  how much to take   metFORMIN 500 MG 24 hr tablet Commonly known as:  GLUCOPHAGE-XR Take 1,000 mg by mouth 2 (two) times daily.   multivitamin capsule Take 1 capsule by mouth daily.   omeprazole 20 MG tablet Commonly known as:  PRILOSEC OTC Take 20 mg by mouth daily.   UNABLE TO FIND Incromega T2671  (fish oils)      No Known Allergies Follow-up Information    Juluis Rainier, MD. Schedule an appointment as soon as possible for a visit in 1 week(s).   Specialty:  Family Medicine Why:  Hospital follow up Contact information: [address] [place]  [address] [phone]        Asa Lente, MD. Schedule an appointment as soon as possible for a visit in 2 week(s).   Specialty:  Neurology Why:  Hospital follow up Contact information: [address] [place] [address] [phone]            The results of significant diagnostics from this hospitalization (including imaging, microbiology, ancillary and laboratory) are listed below for reference.    Significant Diagnostic Studies: Ct Head Wo Contrast  Result Date: 12/19/2018 CLINICAL DATA:  Altered mental status. Multiple sclerosis. EXAM: CT HEAD WITHOUT CONTRAST TECHNIQUE: Contiguous axial images were obtained from the base of the skull through the vertex without intravenous contrast. COMPARISON:  Brain MR dated 04/17/2015. FINDINGS: Brain: Mild-to-moderate enlargement of the ventricles and subarachnoid spaces. Mild-to-moderate patchy white matter low density in both cerebral hemispheres. 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 No intracranial hemorrhage, mass lesion or CT evidence of acute infarction. Vascular: No hyperdense vessel or unexpected calcification. Skull: Normal. Negative for fracture or focal lesion. Sinuses/Orbits: Unremarkable. Other: None. IMPRESSION: 1. No acute abnormality. 2. 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 3. Mildly progressive atrophy and chronic small vessel white matter ischemic changes and possible changes related to the patient's multiple sclerosis. Electronically Signed   By: Beckie Salts M.D.   On: 12/19/2018 17:50   Mr Brain Wo Contrast (neuro Protocol)  Result Date: 12/19/2018 CLINICAL DATA:  Initial evaluation for acute altered mental status. History  of multiple sclerosis. EXAM: MRI HEAD WITHOUT CONTRAST TECHNIQUE: Multiplanar, multiecho pulse sequences of the brain and surrounding structures were obtained without intravenous contrast. COMPARISON:  Prior head CT from earlier same day as well as previous brain MRI from 04/17/2015.  FINDINGS: Brain: Examination moderately degraded by motion artifact. Diffuse prominence of the CSF containing spaces compatible generalized age-related cerebral atrophy. Patchy and confluent T2/FLAIR hyperintensity within the periventricular and deep white matter both cerebral hemispheres, likely in part related to history of multiple sclerosis. A degree of chronic microvascular ischemic disease is suspected as well. Overall, changes are progressed relative to 2016. Scattered superimposed remote lacunar infarcts present within left basal ganglia, right thalamus, and hemispheric cerebral white matter. No convincing abnormal foci of restricted diffusion to suggest acute or subacute ischemia on this motion degraded exam. Few scattered mildly increased foci of diffusion abnormality felt to be most compatible with T2 shine through. No diffusion abnormality to suggest active demyelination. No evidence for acute or chronic intracranial hemorrhage. No imaging findings to suggest pressed. No mass lesion, midline shift or mass effect. No hydrocephalus. No extra-axial fluid collection. Vascular: Major intracranial vascular flow voids maintained. Skull and upper cervical spine: Craniocervical junction within normal limits. Visualized upper cervical spine grossly normal. Bone marrow signal intensity within normal limits. No appreciable scalp soft tissue abnormality. Sinuses/Orbits: Globes orbital soft tissues demonstrate no acute finding. Mild scattered mucosal thickening within the ethmoidal air cells. Paranasal sinuses are otherwise clear. Trace right mastoid effusion noted, of doubtful significance. Inner ear structures normal. Other: None. IMPRESSION: 1. Technically limited exam due to moderate motion degradation. 2. No acute intracranial abnormality. No findings to suggest PRES or other acute finding. 3. Moderate cerebral white matter changes, likely in part due to provided history of multiple sclerosis as well as chronic  microvascular ischemic disease. Multiple superimposed remote lacunar infarcts involving the left basal ganglia, right thalamus, and hemispheric cerebral white matter. Changes are mildly progressed relative to 2016. Electronically Signed   By: Rise Mu M.D.   On: 12/19/2018 23:26   Dg Chest Port 1 View  Result Date: 12/19/2018 CLINICAL DATA:  Altered mental status. EXAM: PORTABLE CHEST 1 VIEW COMPARISON:  Chest x-ray dated November 19, 2009. FINDINGS: The heart size and mediastinal contours are within normal limits. Both lungs are clear. The visualized skeletal structures are unremarkable. IMPRESSION: No active disease. Electronically Signed   By: Obie Dredge M.D.   On: 12/19/2018 19:37    Microbiology: No results found for this or any previous visit (from the past 240 hour(s)).   Labs: Basic Metabolic Panel: Recent Labs  Lab 12/19/18 1719 12/20/18 0425  NA 140 140  K 3.5 3.7  CL 107 106  CO2 22 22  GLUCOSE 168* 140*  BUN 21 20  CREATININE 1.09* 1.07*  CALCIUM 9.3 9.2   Liver Function Tests: Recent Labs  Lab 12/19/18 1719  AST 24  ALT 23  ALKPHOS 117  BILITOT 0.6  PROT 6.1*  ALBUMIN 3.8   No results for input(s): LIPASE, AMYLASE in the last 168 hours. No results for input(s): AMMONIA in the last 168 hours. CBC: Recent Labs  Lab 12/19/18 1719 12/20/18 0425  WBC 10.5 8.4  NEUTROABS 8.7*  --   HGB 13.4 12.2  HCT 42.6 37.6  MCV 87.1 85.5  PLT 250 254   Cardiac Enzymes: No results for input(s): CKTOTAL, CKMB, CKMBINDEX, TROPONINI in the last 168 hours. BNP: BNP (last 3 results) No results for input(s): BNP in the last  8760 hours.  ProBNP (last 3 results) No results for input(s): PROBNP in the last 8760 hours.  CBG: Recent Labs  Lab 12/21/18 1620 12/21/18 2131 12/22/18 0628 12/22/18 0805 12/22/18 1117  GLUCAP 148* 126* 148* 138* 145*       Signed:  Jaeshaun Riva  [facility] 12/22/2018, 3:05 PM

## 2018-12-22 NOTE — Progress Notes (Signed)
PROGRESS NOTE    Janice Little  KGS:[id number] DOB: January 16, 1946 DOA: 12/19/2018 PCP: Juluis Rainier, MD   Brief Narrative:  HPI on 12/19/2018 by Dr. Huey Bienenstock Janice Little  is a 73 y.o. female, past medical history of MS, ambulatory with cane, still driving, hypertension, diabetes mellitus, hyperlipidemia, she was brought by her son secondary to altered mental status, son is a Engineer, civil (consulting), he visited her every few weeks, she was noted to be very confused to him today so he brought her to ED, never had slurred speech, facial droop, focal deficits, tingling or numbness, does not know when this started, for that has been constant, since he saw her today, usually awake alert x3, still driving, currently she is significantly confused, only alert to name. - in ED CT head with no acute finding, significant for old CVA, blood pressure elevated at 220/111, x-ray with no acute findings, UA still pending, no significant lab abnormalities, I was called to admit  Assessment & Plan   Acute metabolic encephalopathy -Resolved. Possibly related to hypertension -Currently she is alert and oriented x3 -CT head showed no acute findings -No focal deficits on exam -MRI: No acute intracranial normality.  No findings to suggest press or other acute finding.  Moderate cerebral white matter changes, microvascular ischemic disease.  Multiple superimposed remote lacunar infarcts.  Changes are mildly progressed relative to 2016. -UA unremarkable for infection -Chest Xray unremarkable for infection -Discussed MRI results with neurology, Dr. Amada Jupiter- recommended outpatient follow up and use of Aspirin 81mg  if no contraindications  Hypertensive urgency -BP initially 220/111 -Discussed with patient, she was taken off of amlodipine due to lower extremity swelling.  Was on lisinopril at home.  -Continue lisinopril 20mg  daily -started hydralazine 25mg  TID -Continue hydralazine PRN  Tongue/lip  discoloration/bruising -Patient will need to follow-up with her PCP  Diabetes mellitus, type II -Metformin held, continue insulin sliding scale and CBG monitoring  Hyperlipidemia -Continue statin  History of MS/unsteady gait/essential tremors -PT consulted recommended home health -Follow-up with neurology  GERD -Continue PPI  ?Dysphagia with vomiting -Resolved -Speech therapy consulted and appreciated- regular, thin liquid. Instructed to take small bites and follow with liquid  Urinary retention?  DVT Prophylaxis  heparin  Code Status: Full  Family Communication: None at bedside  Disposition Plan: Admitted. Pending improvement in HTN  Consultants Neurology   Procedures  None  Antibiotics   Anti-infectives (From admission, onward)   None      Subjective:   Karinda Vigne seen and examined today. No complaints today.  Chest pain, shortness breath, abdominal pain, nausea or vomiting, diarrhea constipation, dizziness or headache.  Objective:   Vitals:   12/22/18 0845 12/22/18 1120 12/22/18 1255 12/22/18 1428  BP: (!) 187/77 (!) 184/86 (!) 184/86 (!) 164/85  Pulse:  74  85  Resp:  18    Temp:  98.6 F (37 C)  98.7 F (37.1 C)  TempSrc:  Oral  Oral  SpO2:  98%  96%  Weight:      Height:        Intake/Output Summary (Last 24 hours) at 12/22/2018 1542 Last data filed at 12/22/2018 1229 Gross per 24 hour  Intake 348 ml  Output -  Net 348 ml   Filed Weights   12/19/18 2000 12/20/18 1538  Weight: 78 kg 78 kg   Exam  General: Well developed, chronically ill-appearing, NAD  HEENT: NCAT, mucous membranes moist.   Cardiovascular: S1 S2 auscultated, RRR  Respiratory: Clear to auscultation bilaterally with  equal chest rise  Abdomen: Soft, nontender, nondistended, + bowel sounds  Extremities: warm dry without cyanosis clubbing or edema  Neuro: AAOx3, nonfocal, + tremor  Psych: Pleasant, normal mood and affect  Data Reviewed: I have personally  reviewed following labs and imaging studies  CBC: Recent Labs  Lab 12/19/18 1719 12/20/18 0425  WBC 10.5 8.4  NEUTROABS 8.7*  --   HGB 13.4 12.2  HCT 42.6 37.6  MCV 87.1 85.5  PLT 250 254   Basic Metabolic Panel: Recent Labs  Lab 12/19/18 1719 12/20/18 0425  NA 140 140  K 3.5 3.7  CL 107 106  CO2 22 22  GLUCOSE 168* 140*  BUN 21 20  CREATININE 1.09* 1.07*  CALCIUM 9.3 9.2   GFR: Estimated Creatinine Clearance: 48.6 mL/min (A) (by C-G formula based on SCr of 1.07 mg/dL (H)). Liver Function Tests: Recent Labs  Lab 12/19/18 1719  AST 24  ALT 23  ALKPHOS 117  BILITOT 0.6  PROT 6.1*  ALBUMIN 3.8   No results for input(s): LIPASE, AMYLASE in the last 168 hours. No results for input(s): AMMONIA in the last 168 hours. Coagulation Profile: Recent Labs  Lab 12/19/18 1719  INR 1.0   Cardiac Enzymes: No results for input(s): CKTOTAL, CKMB, CKMBINDEX, TROPONINI in the last 168 hours. BNP (last 3 results) No results for input(s): PROBNP in the last 8760 hours. HbA1C: No results for input(s): HGBA1C in the last 72 hours. CBG: Recent Labs  Lab 12/21/18 1620 12/21/18 2131 12/22/18 0628 12/22/18 0805 12/22/18 1117  GLUCAP 148* 126* 148* 138* 145*   Lipid Profile: No results for input(s): CHOL, HDL, LDLCALC, TRIG, CHOLHDL, LDLDIRECT in the last 72 hours. Thyroid Function Tests: No results for input(s): TSH, T4TOTAL, FREET4, T3FREE, THYROIDAB in the last 72 hours. Anemia Panel: No results for input(s): VITAMINB12, FOLATE, FERRITIN, TIBC, IRON, RETICCTPCT in the last 72 hours. Urine analysis:    Component Value Date/Time   COLORURINE YELLOW 12/22/2018 1215   APPEARANCEUR HAZY (A) 12/22/2018 1215   LABSPEC 1.005 12/22/2018 1215   PHURINE 6.0 12/22/2018 1215   GLUCOSEU NEGATIVE 12/22/2018 1215   HGBUR NEGATIVE 12/22/2018 1215   BILIRUBINUR NEGATIVE 12/22/2018 1215   KETONESUR NEGATIVE 12/22/2018 1215   PROTEINUR NEGATIVE 12/22/2018 1215   UROBILINOGEN 0.2  11/19/2009 1055   NITRITE NEGATIVE 12/22/2018 1215   LEUKOCYTESUR NEGATIVE 12/22/2018 1215   Sepsis Labs: @LABRCNTIP (procalcitonin:4,lacticidven:4)  )No results found for this or any previous visit (from the past 240 hour(s)).    Radiology Studies: No results found.   Scheduled Meds: . amantadine  100 mg Oral BID  . atorvastatin  20 mg Oral Daily  . heparin  5,000 Units Subcutaneous Q8H  . hydrALAZINE  25 mg Oral Q8H  . insulin aspart  0-9 Units Subcutaneous TID WC  . lisinopril  20 mg Oral Daily  . multivitamin with minerals  1 tablet Oral Daily  . pantoprazole  40 mg Oral Daily  . sodium chloride flush  3 mL Intravenous Q12H   Continuous Infusions:   LOS: 3 days   Time Spent in minutes   30 minutes  Olayinka Gathers D.O. on 12/22/2018 at 3:42 PM  Between 7am to 7pm - Please see pager noted on [email or web address]  After 7pm go to [email or web address]  And look for the night coverage person covering for me after hours  [facility] Office  [phone]

## 2018-12-22 NOTE — Care Management (Signed)
Spoke w patient, discussed HH options, she would like to use [facility] for [facility] PT. Referral accepted by [facility]. 3/1 requested to be delivered to room prior to DC through [employer]. No other CM needs

## 2018-12-22 NOTE — Plan of Care (Signed)
  Problem: Clinical Measurements: Goal: Ability to maintain clinical measurements within normal limits will improve Outcome: Progressing   Problem: Elimination: Goal: Will not experience complications related to bowel motility Outcome: Progressing   Problem: Pain Managment: Goal: General experience of comfort will improve Outcome: Progressing   

## 2018-12-22 NOTE — Discharge Instructions (Signed)
Confusion Confusion is the inability to think with the usual speed or clarity. People who are confused often describe their thinking as cloudy or unclear. Confusion can also include feeling disoriented. This means you are unaware of where you are or who you are. You may also not know the date or time. When confused, you may have difficulty remembering, paying attention, or making decisions. Some people also act aggressively when they are confused. In some cases, confusion may come on quickly. In other cases, it may develop slowly over time. How quickly confusion comes on depends on the cause. Confusion may be caused by:  Head injury (concussion).  Seizures.  Stroke.  Fever.  Brain tumor.  Decrease in brain function due to a vascular or neurologic condition (dementia).  Emotions, like rage or terror.  Inability to know what is real and what is not (hallucinations).  Infections, such as a urinary tract infection (UTI).  Using too much alcohol, drugs, or medicines.  Loss of fluid (dehydration) or an imbalance of salts in the body (electrolytes).  Lack of sleep.  Low blood sugar (diabetes).  Low levels of oxygen. This comes from conditions such as chronic lung disorders.  Side effects of medicines, or taking medicines that affect other medicines (drug interactions).  Lack of certain nutrients, especially niacin, thiamine, vitamin C, or vitamin B.  Sudden drop in body temperature (hypothermia).  Change in routine, such as traveling or being hospitalized. Follow these instructions at home: Pay attention to your symptoms. Tell your health care provider about any changes or if you develop new symptoms. Follow these instructions to control or treat symptoms. Ask a family member or friend for help if needed. Medicines  Take over-the-counter and prescription medicines only as told by your health care provider.  Ask your health care provider about changing or stopping any  medicines that may be causing your confusion.  Avoid pain medicines or sleep medicines until you have fully recovered.  Use a pillbox or an alarm to help you take the right medicines at the right time. Lifestyle   Eat a balanced diet that includes fruits and vegetables.  Get enough sleep. For most adults, this is 7-9 hours each night.  Do not drink alcohol.  Do not become isolated. Spend time with other people and make plans for your days.  Do not drive until your health care provider says that it is safe to do so.  Do not use any products that contain nicotine or tobacco, such as cigarettes and e-cigarettes. If you need help quitting, ask your health care provider.  Stop other activities that may increase your chances of getting hurt. These may include some work duties, sports activities, swimming, or bike riding. Ask your health care provider what activities are safe for you. What caregivers can do  Find out if the person is confused. Ask the person to state his or her name, age, and the date. If the person is unsure or answers incorrectly, he or she may be confused.  Always introduce yourself, no matter how well the person knows you.  Remind the person of his or her location. Do this often.  Place a calendar and clock near the person who is confused.  Talk about current events and plans for the day.  Keep the environment calm, quiet, and peaceful.  Help the person do the things that he or she is unable to do. These include: ? Taking medicines. ? Keeping follow-up visits with his or her health  care provider. ? Helping with household duties, including meal preparation. ? Running errands.  Get help if you need it. There are several support groups for caregivers.  If the person you are helping needs more support, consider day care, extended care programs, or a skilled nursing facility. The person's health care provider may be able to help evaluate these options. General  instructions  Monitor yourself for any conditions you may have. These may include: ? Checking your blood glucose levels, if you have diabetes. ? Watching your weight, if you are overweight. ? Monitoring your blood pressure, if you have hypertension. ? Monitoring your body temperature, if you have a fever.  Keep all follow-up visits as told by your health care provider. This is important. Contact a health care provider if:  Your symptoms get worse. Get help right away if you:  Feel that you are not able to care for yourself.  Develop severe headaches, repeated vomiting, seizures, blackouts, or slurred speech.  Have increasing confusion, weakness, numbness, restlessness, or personality changes.  Develop a loss of balance, have marked dizziness, feel uncoordinated, or fall.  Develop severe anxiety, or you have delusions or hallucinations. These symptoms may represent a serious problem that is an emergency. Do not wait to see if the symptoms will go away. Get medical help right away. Call your local emergency services (911 in the [place] Do not drive yourself to the hospital. Summary  Confusion is the inability to think with the usual speed or clarity. People who are confused often describe their thinking as cloudy or unclear.  Confusion can also include having difficulty remembering, paying attention, or making decisions.  Confusion may come on quickly or develop slowly over time, depending on the cause. There are many different causes of confusion.  Ask for help from family members or friends if you are unable to take care of yourself. This information is not intended to replace advice given to you by your health care provider. Make sure you discuss any questions you have with your health care provider. Document Released: 10/27/2004 Document Revised: 09/21/2017 Document Reviewed: 09/21/2017 Elsevier Interactive Patient Education  2019 [employer] Hypertension Hypertension,  commonly called high blood pressure, is when the force of blood pumping through the arteries is too strong. The arteries are the blood vessels that carry blood from the heart throughout the body. Hypertension forces the heart to work harder to pump blood and may cause arteries to become narrow or stiff. Having untreated or uncontrolled hypertension can cause heart attacks, strokes, kidney disease, and other problems. A blood pressure reading consists of a higher number over a lower number. Ideally, your blood pressure should be below 120/80. The first ("top") number is called the systolic pressure. It is a measure of the pressure in your arteries as your heart beats. The second ("bottom") number is called the diastolic pressure. It is a measure of the pressure in your arteries as the heart relaxes. What are the causes? The cause of this condition is not known. What increases the risk? Some risk factors for high blood pressure are under your control. Others are not. Factors you can change  Smoking.  Having type 2 diabetes mellitus, high cholesterol, or both.  Not getting enough exercise or physical activity.  Being overweight.  Having too much fat, sugar, calories, or salt (sodium) in your diet.  Drinking too much alcohol. Factors that are difficult or impossible to change  Having chronic kidney disease.  Having a family history of high  blood pressure.  Age. Risk increases with age.  Race. You may be at higher risk if you are African-American.  Gender. Men are at higher risk than women before age 48. After age 42, women are at higher risk than men.  Having obstructive sleep apnea.  Stress. What are the signs or symptoms? Extremely high blood pressure (hypertensive crisis) may cause:  Headache.  Anxiety.  Shortness of breath.  Nosebleed.  Nausea and vomiting.  Severe chest pain.  Jerky movements you cannot control (seizures). How is this diagnosed? This condition is  diagnosed by measuring your blood pressure while you are seated, with your arm resting on a surface. The cuff of the blood pressure monitor will be placed directly against the skin of your upper arm at the level of your heart. It should be measured at least twice using the same arm. Certain conditions can cause a difference in blood pressure between your right and left arms. Certain factors can cause blood pressure readings to be lower or higher than normal (elevated) for a short period of time:  When your blood pressure is higher when you are in a health care provider's office than when you are at home, this is called white coat hypertension. Most people with this condition do not need medicines.  When your blood pressure is higher at home than when you are in a health care provider's office, this is called masked hypertension. Most people with this condition may need medicines to control blood pressure. If you have a high blood pressure reading during one visit or you have normal blood pressure with other risk factors:  You may be asked to return on a different day to have your blood pressure checked again.  You may be asked to monitor your blood pressure at home for 1 week or longer. If you are diagnosed with hypertension, you may have other blood or imaging tests to help your health care provider understand your overall risk for other conditions. How is this treated? This condition is treated by making healthy lifestyle changes, such as eating healthy foods, exercising more, and reducing your alcohol intake. Your health care provider may prescribe medicine if lifestyle changes are not enough to get your blood pressure under control, and if:  Your systolic blood pressure is above 130.  Your diastolic blood pressure is above 80. Your personal target blood pressure may vary depending on your medical conditions, your age, and other factors. Follow these instructions at home: Eating and  drinking   Eat a diet that is high in fiber and potassium, and low in sodium, added sugar, and fat. An example eating plan is called the DASH (Dietary Approaches to Stop Hypertension) diet. To eat this way: ? Eat plenty of fresh fruits and vegetables. Try to fill half of your plate at each meal with fruits and vegetables. ? Eat whole grains, such as whole wheat pasta, brown rice, or whole grain bread. Fill about one quarter of your plate with whole grains. ? Eat or drink low-fat dairy products, such as skim milk or low-fat yogurt. ? Avoid fatty cuts of meat, processed or cured meats, and poultry with skin. Fill about one quarter of your plate with lean proteins, such as fish, chicken without skin, beans, eggs, and tofu. ? Avoid premade and processed foods. These tend to be higher in sodium, added sugar, and fat.  Reduce your daily sodium intake. Most people with hypertension should eat less than 1,500 mg of sodium a day.  Limit alcohol intake to no more than 1 drink a day for nonpregnant women and 2 drinks a day for men. One drink equals 12 oz of beer, 5 oz of wine, or 1 oz of hard liquor. Lifestyle   Work with your health care provider to maintain a healthy body weight or to lose weight. Ask what an ideal weight is for you.  Get at least 30 minutes of exercise that causes your heart to beat faster (aerobic exercise) most days of the week. Activities may include walking, swimming, or biking.  Include exercise to strengthen your muscles (resistance exercise), such as pilates or lifting weights, as part of your weekly exercise routine. Try to do these types of exercises for 30 minutes at least 3 days a week.  Do not use any products that contain nicotine or tobacco, such as cigarettes and e-cigarettes. If you need help quitting, ask your health care provider.  Monitor your blood pressure at home as told by your health care provider.  Keep all follow-up visits as told by your health care  provider. This is important. Medicines  Take over-the-counter and prescription medicines only as told by your health care provider. Follow directions carefully. Blood pressure medicines must be taken as prescribed.  Do not skip doses of blood pressure medicine. Doing this puts you at risk for problems and can make the medicine less effective.  Ask your health care provider about side effects or reactions to medicines that you should watch for. Contact a health care provider if:  You think you are having a reaction to a medicine you are taking.  You have headaches that keep coming back (recurring).  You feel dizzy.  You have swelling in your ankles.  You have trouble with your vision. Get help right away if:  You develop a severe headache or confusion.  You have unusual weakness or numbness.  You feel faint.  You have severe pain in your chest or abdomen.  You vomit repeatedly.  You have trouble breathing. Summary  Hypertension is when the force of blood pumping through your arteries is too strong. If this condition is not controlled, it may put you at risk for serious complications.  Your personal target blood pressure may vary depending on your medical conditions, your age, and other factors. For most people, a normal blood pressure is less than 120/80.  Hypertension is treated with lifestyle changes, medicines, or a combination of both. Lifestyle changes include weight loss, eating a healthy, low-sodium diet, exercising more, and limiting alcohol. This information is not intended to replace advice given to you by your health care provider. Make sure you discuss any questions you have with your health care provider. Document Released: 09/19/2005 Document Revised: 08/17/2016 Document Reviewed: 08/17/2016 Elsevier Interactive Patient Education  2019 [employer].

## 2018-12-23 LAB — GLUCOSE, CAPILLARY
GLUCOSE-CAPILLARY: 136 mg/dL — AB (ref 70–99)
Glucose-Capillary: 153 mg/dL — ABNORMAL HIGH (ref 70–99)
Glucose-Capillary: 139 mg/dL — ABNORMAL HIGH (ref 70–99)
Glucose-Capillary: 142 mg/dL — ABNORMAL HIGH (ref 70–99)
Glucose-Capillary: 128 mg/dL — ABNORMAL HIGH (ref 70–99)

## 2018-12-23 NOTE — Progress Notes (Signed)
Physical Therapy Treatment Patient Details Name: Janice Little MRN: [id number] DOB: Jun 07, 1946 Today's Date: 12/23/2018    History of Present Illness Janice Little is an 73 y.o. female with past medical history of multiple sclerosis, hypertension, diabetes mellitus, hyperlipidemia brought to emergency department after her son found patient very confused. In the ED pt's BP was in the 220 systolic range.  Imaging did not show any abnormal acute findings.    PT Comments    Patient with less foot pain, ambulating 21' with supervision and use of RW. Pt expresses she does not have the financial means to cover SNF and states she wishes to return home and manage at home. Patient is moving better than prior PT visit now with less foot pain, feel HHPT would be next best option.    Follow Up Recommendations  HHPT (pt declining SNF)     Equipment Recommendations  None recommended by PT    Recommendations for Other Services       Precautions / Restrictions Precautions Precautions: Fall    Mobility  Bed Mobility Overal bed mobility: Needs Assistance Bed Mobility: Supine to Sit     Supine to sit: Min guard     General bed mobility comments: use of rail  Transfers Overall transfer level: Needs assistance Equipment used: Straight cane Transfers: Sit to/from Stand Sit to Stand: Min assist         General transfer comment: assistance required for balance; pt with posterior LOB when attempting to pull up underwear at EOB and required increased assistance to recover and maintain standing  Ambulation/Gait Ambulation/Gait assistance: Supervision Gait Distance (Feet): 70 Feet Assistive device: Rolling walker (2 wheeled) Gait Pattern/deviations: Step-through pattern;Trunk flexed;Decreased step length - right;Decreased step length - left     General Gait Details: ambulating with RW with supervisoin some balance deficits noted but no overt LOB   Stairs              Wheelchair Mobility    Modified Rankin (Stroke Patients Only)       Balance Overall balance assessment: Needs assistance   Sitting balance-Leahy Scale: Fair Sitting balance - Comments: pt with R lateral lean in sitting and states "I don't feel right. I feel like i'm falling backwards"   Standing balance support: Bilateral upper extremity supported;Single extremity supported Standing balance-Leahy Scale: Poor                              Cognition Arousal/Alertness: Awake/alert Behavior During Therapy: WFL for tasks assessed/performed Overall Cognitive Status: No family/caregiver present to determine baseline cognitive functioning                                        Exercises      General Comments        Pertinent Vitals/Pain Pain Assessment: No/denies pain Pain Intervention(s): (reports her foot hurts less today)    Home Living                      Prior Function            PT Goals (current goals can now be found in the care plan section) Acute Rehab PT Goals Patient Stated Goal: back home PT Goal Formulation: With patient Time For Goal Achievement: 12/27/18 Potential to Achieve Goals: Good Progress towards PT goals:  Progressing toward goals    Frequency    Min 3X/week      PT Plan Discharge plan needs to be updated    Co-evaluation              AM-PAC PT "6 Clicks" Mobility   Outcome Measure  Help needed turning from your back to your side while in a flat bed without using bedrails?: A Little Help needed moving from lying on your back to sitting on the side of a flat bed without using bedrails?: A Little Help needed moving to and from a bed to a chair (including a wheelchair)?: A Lot Help needed standing up from a chair using your arms (e.g., wheelchair or bedside chair)?: A Little Help needed to walk in hospital room?: A Lot Help needed climbing 3-5 steps with a railing? : A Lot 6 Click Score:  15    End of Session Equipment Utilized During Treatment: Gait belt Activity Tolerance: Patient tolerated treatment well Patient left: in chair;with call bell/phone within reach;with chair alarm set Nurse Communication: Mobility status PT Visit Diagnosis: Unsteadiness on feet (R26.81);Muscle weakness (generalized) (M62.81);History of falling (Z91.81)     Time: 1800-1820 PT Time Calculation (min) (ACUTE ONLY): 20 min  Charges:  $Gait Training: 8-22 mins                     Etta Grandchild, PT, DPT Acute Rehabilitation Services Pager: [phone] Office: [phone]     Etta Grandchild 12/23/2018, 6:44 PM

## 2018-12-23 NOTE — Progress Notes (Signed)
PROGRESS NOTE    DELAINY Little  OZH:[id number] DOB: 1945-10-21 DOA: 12/19/2018 PCP: Juluis Rainier, MD   Brief Narrative:  HPI on 12/19/2018 by Dr. Huey Bienenstock Janice Little  is a 73 y.o. female, past medical history of MS, ambulatory with cane, still driving, hypertension, diabetes mellitus, hyperlipidemia, she was brought by her son secondary to altered mental status, son is a Engineer, civil (consulting), he visited her every few weeks, she was noted to be very confused to him today so he brought her to ED, never had slurred speech, facial droop, focal deficits, tingling or numbness, does not know when this started, for that has been constant, since he saw her today, usually awake alert x3, still driving, currently she is significantly confused, only alert to name. - in ED CT head with no acute finding, significant for old CVA, blood pressure elevated at 220/111, x-ray with no acute findings, UA still pending, no significant lab abnormalities, I was called to admit  Assessment & Plan   Acute metabolic encephalopathy -Resolved. Possibly related to hypertension -Currently she is alert and oriented x3 -CT head showed no acute findings -No focal deficits on exam -MRI: No acute intracranial normality.  No findings to suggest press or other acute finding.  Moderate cerebral white matter changes, microvascular ischemic disease.  Multiple superimposed remote lacunar infarcts.  Changes are mildly progressed relative to 2016. -UA unremarkable for infection -Chest Xray unremarkable for infection -Discussed MRI results with neurology, Dr. Amada Jupiter- recommended outpatient follow up and use of Aspirin 81mg  if no contraindications  Hypertensive urgency -BP initially 220/111 -Discussed with patient, she was taken off of amlodipine due to lower extremity swelling.  Was on lisinopril at home.  -Continue lisinopril 20mg  daily -started hydralazine 25mg  TID -Continue hydralazine PRN  Tongue/lip  discoloration/bruising -Patient will need to follow-up with her PCP  Diabetes mellitus, type II -Metformin held, continue insulin sliding scale and CBG monitoring  Hyperlipidemia -Continue statin  History of MS/unsteady gait/essential tremors -PT consulted, initially recommended home health however now recommending SNF -Have asked for PT to reevaluate patient today -Social work consulted -Follow-up with neurology  GERD -Continue PPI  ?Dysphagia with vomiting -Resolved -Speech therapy consulted and appreciated- regular, thin liquid. Instructed to take small bites and follow with liquid  Urinary retention -likely secondary to amantadine -currently able to urinate  Right foot pain -resolved -No edema or bruising on exam -R foot xrays negative for fracture. -No longer having pain  DVT Prophylaxis  heparin  Code Status: Full  Family Communication: None at bedside  Disposition Plan: Admitted. Pending SNF  Consultants Neurology   Procedures  None  Antibiotics   Anti-infectives (From admission, onward)   None      Subjective:   Dasiah Nees seen and examined today. No complaints today. States she is feeling better. No longer having right foot pain. Denies chest pain, shortness of breath, abdominal pain, N/V/D/C.  Objective:   Vitals:   12/23/18 0301 12/23/18 0735 12/23/18 0806 12/23/18 1101  BP: (!) 151/70 (!) 179/80 (!) 178/73 (!) 161/70  Pulse: 88  81 86  Resp: 18  16 16   Temp: 98.3 F (36.8 C)  98.6 F (37 C) 98.4 F (36.9 C)  TempSrc: Oral  Oral Oral  SpO2: 96%  93% 98%  Weight:      Height:        Intake/Output Summary (Last 24 hours) at 12/23/2018 1543 Last data filed at 12/22/2018 2143 Gross per 24 hour  Intake 3 ml  Output  525 ml  Net -522 ml   Filed Weights   12/19/18 2000 12/20/18 1538  Weight: 78 kg 78 kg   Exam  General: Well developed, well nourished, NAD, appears stated age  HEENT: NCAT, mucous membranes moist.   Neck:  Supple  Cardiovascular: S1 S2 auscultated, RRR, no murmur  Respiratory: Clear to auscultation bilaterally   Abdomen: Soft, nontender, nondistended, + bowel sounds  Extremities: warm dry without cyanosis clubbing or edema  Neuro: AAOx3, nonfocal, +tremor  Psych: Pleasant, appropriate mood and affect  Data Reviewed: I have personally reviewed following labs and imaging studies  CBC: Recent Labs  Lab 12/19/18 1719 12/20/18 0425  WBC 10.5 8.4  NEUTROABS 8.7*  --   HGB 13.4 12.2  HCT 42.6 37.6  MCV 87.1 85.5  PLT 250 254   Basic Metabolic Panel: Recent Labs  Lab 12/19/18 1719 12/20/18 0425  NA 140 140  K 3.5 3.7  CL 107 106  CO2 22 22  GLUCOSE 168* 140*  BUN 21 20  CREATININE 1.09* 1.07*  CALCIUM 9.3 9.2   GFR: Estimated Creatinine Clearance: 48.6 mL/min (A) (by C-G formula based on SCr of 1.07 mg/dL (H)). Liver Function Tests: Recent Labs  Lab 12/19/18 1719  AST 24  ALT 23  ALKPHOS 117  BILITOT 0.6  PROT 6.1*  ALBUMIN 3.8   No results for input(s): LIPASE, AMYLASE in the last 168 hours. No results for input(s): AMMONIA in the last 168 hours. Coagulation Profile: Recent Labs  Lab 12/19/18 1719  INR 1.0   Cardiac Enzymes: No results for input(s): CKTOTAL, CKMB, CKMBINDEX, TROPONINI in the last 168 hours. BNP (last 3 results) No results for input(s): PROBNP in the last 8760 hours. HbA1C: No results for input(s): HGBA1C in the last 72 hours. CBG: Recent Labs  Lab 12/22/18 1743 12/22/18 2108 12/23/18 0603 12/23/18 0738 12/23/18 1110  GLUCAP 128* 148* 153* 139* 136*   Lipid Profile: No results for input(s): CHOL, HDL, LDLCALC, TRIG, CHOLHDL, LDLDIRECT in the last 72 hours. Thyroid Function Tests: No results for input(s): TSH, T4TOTAL, FREET4, T3FREE, THYROIDAB in the last 72 hours. Anemia Panel: No results for input(s): VITAMINB12, FOLATE, FERRITIN, TIBC, IRON, RETICCTPCT in the last 72 hours. Urine analysis:    Component Value Date/Time    COLORURINE YELLOW 12/22/2018 1215   APPEARANCEUR HAZY (A) 12/22/2018 1215   LABSPEC 1.005 12/22/2018 1215   PHURINE 6.0 12/22/2018 1215   GLUCOSEU NEGATIVE 12/22/2018 1215   HGBUR NEGATIVE 12/22/2018 1215   BILIRUBINUR NEGATIVE 12/22/2018 1215   KETONESUR NEGATIVE 12/22/2018 1215   PROTEINUR NEGATIVE 12/22/2018 1215   UROBILINOGEN 0.2 11/19/2009 1055   NITRITE NEGATIVE 12/22/2018 1215   LEUKOCYTESUR NEGATIVE 12/22/2018 1215   Sepsis Labs: (procalcitonin:4,lacticidven:4)  ) Recent Results (from the past 240 hour(s))  Culture, Urine     Status: Abnormal (Preliminary result)   Collection Time: 12/22/18  6:20 PM  Result Value Ref Range Status   Specimen Description URINE, CLEAN CATCH  Final   Special Requests   Final    NONE Performed at [facility], [address], [place], [place] [address]    Culture >=100,000 COLONIES/mL ESCHERICHIA COLI (A)  Final   Report Status PENDING  Incomplete      Radiology Studies: Dg Foot 2 Views Right  Result Date: 12/22/2018 CLINICAL DATA:  Right foot pain EXAM: RIGHT FOOT - 2 VIEW COMPARISON:  None. FINDINGS: No acute fracture or dislocation is noted. Mild calcaneal spurring is seen. No other focal abnormality  is seen. IMPRESSION: Mild degenerative change without acute abnormality. Electronically Signed   By: Alcide Clever M.D.   On: 12/22/2018 20:17     Scheduled Meds: . atorvastatin  20 mg Oral Daily  . heparin  5,000 Units Subcutaneous Q8H  . hydrALAZINE  25 mg Oral Q8H  . insulin aspart  0-9 Units Subcutaneous TID WC  . lisinopril  20 mg Oral Daily  . multivitamin with minerals  1 tablet Oral Daily  . pantoprazole  40 mg Oral Daily  . sodium chloride flush  3 mL Intravenous Q12H   Continuous Infusions:   LOS: 4 days   Time Spent in minutes   30 minutes  Matie Dimaano D.O. on 12/23/2018 at 3:43 PM  Between 7am to 7pm - Please see pager noted on [email or web address]  After 7pm go to [email or web address]  And look for the night  coverage person covering for me after hours  [facility] Office  [phone]

## 2018-12-24 LAB — BASIC METABOLIC PANEL
Anion gap: 7 (ref 5–15)
BUN: 20 mg/dL (ref 8–23)
CO2: 25 mmol/L (ref 22–32)
Calcium: 9.1 mg/dL (ref 8.9–10.3)
Chloride: 105 mmol/L (ref 98–111)
Creatinine, Ser: 1.15 mg/dL — ABNORMAL HIGH (ref 0.44–1.00)
GFR calc Af Amer: 54 mL/min — ABNORMAL LOW (ref >60)
GFR calc non Af Amer: 47 mL/min — ABNORMAL LOW (ref >60)
Glucose, Bld: 92 mg/dL (ref 70–99)
Potassium: 4.3 mmol/L (ref 3.5–5.1)
Sodium: 137 mmol/L (ref 135–145)

## 2018-12-24 LAB — GLUCOSE, CAPILLARY
Glucose-Capillary: 122 mg/dL — ABNORMAL HIGH (ref 70–99)
Glucose-Capillary: 117 mg/dL — ABNORMAL HIGH (ref 70–99)
Glucose-Capillary: 113 mg/dL — ABNORMAL HIGH (ref 70–99)
Glucose-Capillary: 120 mg/dL — ABNORMAL HIGH (ref 70–99)
Glucose-Capillary: 122 mg/dL — ABNORMAL HIGH (ref 70–99)

## 2018-12-24 LAB — URINE CULTURE: Culture: >=100000 — AB

## 2018-12-24 MED ORDER — LISINOPRIL 20 MG PO TABS
40.0000 mg | ORAL_TABLET | Freq: Every day | ORAL | Status: DC
  Administered 2018-12-25 – 2018-12-26 (×3): 40 mg via ORAL
  Filled 2018-12-24 (×3): qty 2

## 2018-12-24 MED ORDER — BISACODYL 10 MG RE SUPP: 10.0000 mg | Freq: Every day | RECTAL | Status: DC | PRN

## 2018-12-24 MED ORDER — LISINOPRIL 20 MG PO TABS
20.0000 mg | ORAL_TABLET | ORAL | Status: AC
  Administered 2018-12-24: 20 mg via ORAL
  Filled 2018-12-24: qty 1

## 2018-12-24 MED ORDER — POLYETHYLENE GLYCOL 3350 17 G PO PACK
17.0000 g | PACK | Freq: Every day | ORAL | Status: DC
  Administered 2018-12-24 – 2018-12-26 (×2): 17 g via ORAL
  Filled 2018-12-24 (×2): qty 1

## 2018-12-24 NOTE — Progress Notes (Signed)
Physical Therapy Treatment Patient Details Name: Janice Little MRN: [id number] DOB: Jan 06, 1946 Today's Date: 12/24/2018    History of Present Illness Janice Little is an 73 y.o. female with past medical history of multiple sclerosis, hypertension, diabetes mellitus, hyperlipidemia brought to emergency department after her son found patient very confused. In the ED pt's BP was in the 220 systolic range.  Imaging did not show any abnormal acute findings.    PT Comments    Continuing work on functional mobility and activity tolerance;  Session focused on ambulation, used RW this session to be able to more effectively unweigh painful R foot in stance; She is concerned about need to movel her bowels; We talked about her fall risk and instability, and it sounds like she Is more amenable to SNF;   Her painful R foot is very tender to palpation at the arch, noted some erythema there compared to L foot; Pain with weight bearing and with AROM of R foot and ankle; Gout would be unlikely, given location; but it's probably worth looking further into because this foot pain is effecting her ability to walk.  Follow Up Recommendations  SNF     Equipment Recommendations  None recommended by PT    Recommendations for Other Services       Precautions / Restrictions Precautions Precautions: Fall Restrictions Weight Bearing Restrictions: No    Mobility  Bed Mobility Overal bed mobility: Needs Assistance Bed Mobility: Supine to Sit     Supine to sit: Min guard     General bed mobility comments: use of rail  Transfers Overall transfer level: Needs assistance Equipment used: Rolling walker (2 wheeled) Transfers: Sit to/from Stand Sit to Stand: Min guard         General transfer comment: Cues for hand placement and safety; Heavy dependence on UE support and push to get to fully upright standing; less posterior lean compared to yesterday; noting little carryover re: education for  ahnd placement  Ambulation/Gait Ambulation/Gait assistance: Supervision Gait Distance (Feet): (Hallway ambulation) Assistive device: Rolling walker (2 wheeled) Gait Pattern/deviations: Step-through pattern;Trunk flexed;Decreased step length - right;Decreased step length - left     General Gait Details: Cues to use RW to unweigh painful R foot; ambulating with RW with supervisoin some balance deficits noted but no overt LOB; cues to self-monitor for activity tolerance   Stairs             Wheelchair Mobility    Modified Rankin (Stroke Patients Only)       Balance     Sitting balance-Leahy Scale: Fair       Standing balance-Leahy Scale: Poor Standing balance comment: Dependent on UE support                            Cognition Arousal/Alertness: Awake/alert Behavior During Therapy: WFL for tasks assessed/performed Overall Cognitive Status: No family/caregiver present to determine baseline cognitive functioning                                        Exercises      General Comments General comments (skin integrity, edema, etc.): Continued high fall risk      Pertinent Vitals/Pain Pain Assessment: 0-10 Pain Score: 3  Pain Location: Instep of L foot Pain Descriptors / Indicators: Aching Pain Intervention(s): Limited activity within patient's tolerance  Home Living                      Prior Function            PT Goals (current goals can now be found in the care plan section) Acute Rehab PT Goals Patient Stated Goal: back home PT Goal Formulation: With patient Time For Goal Achievement: 12/27/18 Potential to Achieve Goals: Good Progress towards PT goals: Progressing toward goals    Frequency    Min 3X/week      PT Plan Current plan remains appropriate    Co-evaluation              AM-PAC PT "6 Clicks" Mobility   Outcome Measure  Help needed turning from your back to your side while in a flat  bed without using bedrails?: None Help needed moving from lying on your back to sitting on the side of a flat bed without using bedrails?: A Little Help needed moving to and from a bed to a chair (including a wheelchair)?: A Little Help needed standing up from a chair using your arms (e.g., wheelchair or bedside chair)?: A Little Help needed to walk in hospital room?: A Little Help needed climbing 3-5 steps with a railing? : A Lot 6 Click Score: 18    End of Session Equipment Utilized During Treatment: Gait belt Activity Tolerance: Patient tolerated treatment well Patient left: in chair;with call bell/phone within reach;with chair alarm set Nurse Communication: Mobility status PT Visit Diagnosis: Unsteadiness on feet (R26.81);Muscle weakness (generalized) (M62.81);History of falling (Z91.81)     Time: 0350-0938 PT Time Calculation (min) (ACUTE ONLY): 17 min  Charges:  $Gait Training: 8-22 mins                     Van Clines, Vance  Acute Rehabilitation Services Pager [phone] Office [phone]    Levi Aland 12/24/2018, 2:19 PM

## 2018-12-24 NOTE — Progress Notes (Addendum)
Occupational Therapy Treatment Patient Details Name: Janice Little MRN: [id number] DOB: 21-Aug-1946 Today's Date: 12/24/2018    History of present illness Janice Little is an 73 y.o. female with past medical history of multiple sclerosis, hypertension, diabetes mellitus, hyperlipidemia brought to emergency department after her son found patient very confused. In the ED pt's BP was in the 220 systolic range.  Imaging did not show any abnormal acute findings.   OT comments  Pt presents supine in bed pleasant and willing to participate in therapy session. Pt engaged in Pill Box Test during this session. Pt requiring approx 10 min to perform test (passing score is completing in 5 min or less), but was able to perform test with 0 errors noted. Pt reports on day of admission to hospital she was having increased difficulty performing medication management, though reports her cognition now seems to be improving closer to her baseline. Pt with mild imbalances sitting EOB intermittently falling towards L/R side, but is able to self-correct. Pt also verbalizing now agreeable to SNF rehab at time of discharge - given pt lives alone and without available 24hr supervision/assist feel this recommendation is appropriate. Will continue to follow acutely to progress pt towards established OT goals.    Follow Up Recommendations  SNF    Equipment Recommendations  3 in 1 bedside commode          Precautions / Restrictions Precautions Precautions: Fall Restrictions Weight Bearing Restrictions: No       Mobility Bed Mobility Overal bed mobility: Needs Assistance Bed Mobility: Supine to Sit;Sit to Supine     Supine to sit: Min guard Sit to supine: Min guard   General bed mobility comments: for general safety, no physical assist required  Transfers                      Balance Overall balance assessment: Needs assistance   Sitting balance-Leahy Scale: Fair Sitting balance -  Comments: pt noted to have increased difficulty maintaining upright posture without feet fully support, R and L lateral lean but pt is able to correct                                   ADL either performed or assessed with clinical judgement   ADL Overall ADL's : Needs assistance/impaired                                       General ADL Comments: focus of session on performing pill box test. Pt requires increased time to perform (approx 10 min, passing score is completed within 5 min), but pt is able to perform test with 0 errors.      Vision       Perception     Praxis      Cognition Arousal/Alertness: Awake/alert Behavior During Therapy: WFL for tasks assessed/performed Overall Cognitive Status: No family/caregiver present to determine baseline cognitive functioning                                 General Comments: pt with improvements in cognition today compared to previous OT session; completed pill box test to assess higher level cognition (see below for detail)         Exercises  Shoulder Instructions       General Comments pt now more agreeable to SNF at discharge    Pertinent Vitals/ Pain       Pain Assessment: Faces Faces Pain Scale: Hurts a little bit Pain Location: Instep of L foot Pain Descriptors / Indicators: Aching Pain Intervention(s): Monitored during session  Home Living                                          Prior Functioning/Environment              Frequency  Min 2X/week        Progress Toward Goals  OT Goals(current goals can now be found in the care plan section)  Progress towards OT goals: Progressing toward goals  Acute Rehab OT Goals Patient Stated Goal: back home OT Goal Formulation: With patient Time For Goal Achievement: 01/04/19 Potential to Achieve Goals: Good ADL Goals Pt Will Perform Grooming: with modified independence;standing Pt Will Perform  Lower Body Bathing: with modified independence;sit to/from stand Pt Will Perform Lower Body Dressing: with modified independence;sit to/from stand Pt Will Transfer to Toilet: with modified independence;ambulating Pt Will Perform Toileting - Clothing Manipulation and hygiene: with modified independence;sit to/from stand Pt Will Perform Tub/Shower Transfer: with supervision;ambulating;shower seat;3 in 1 Additional ADL Goal #1: Pt will demonstrate ability to perform 3 step path finding task with min cues.  Plan Discharge plan needs to be updated    Co-evaluation                 AM-PAC OT "6 Clicks" Daily Activity     Outcome Measure   Help from another person eating meals?: None Help from another person taking care of personal grooming?: A Little Help from another person toileting, which includes using toliet, bedpan, or urinal?: A Little Help from another person bathing (including washing, rinsing, drying)?: A Little Help from another person to put on and taking off regular upper body clothing?: None Help from another person to put on and taking off regular lower body clothing?: A Little 6 Click Score: 20    End of Session    OT Visit Diagnosis: Unsteadiness on feet (R26.81);Muscle weakness (generalized) (M62.81)   Activity Tolerance Patient tolerated treatment well   Patient Left in bed;with call bell/phone within reach   Nurse Communication Mobility status        Time: 8889-1694 OT Time Calculation (min): 23 min  Charges: OT General Charges $OT Visit: 1 Visit OT Treatments $Therapeutic Activity: 8-22 mins  Marcy Siren, OT Supplemental Rehabilitation Services Pager [phone] Office [phone]     Orlando Penner 12/24/2018, 4:39 PM

## 2018-12-24 NOTE — Progress Notes (Signed)
PROGRESS NOTE    DAESIA CREEDEN  IDC:[id number] DOB: Sep 29, 1946 DOA: 12/19/2018 PCP: Juluis Rainier, MD   Brief Narrative:  HPI on 12/19/2018 by Dr. Huey Bienenstock Lyndy Powles  is a 73 y.o. female, past medical history of MS, ambulatory with cane, still driving, hypertension, diabetes mellitus, hyperlipidemia, she was brought by her son secondary to altered mental status, son is a Engineer, civil (consulting), he visited her every few weeks, she was noted to be very confused to him today so he brought her to ED, never had slurred speech, facial droop, focal deficits, tingling or numbness, does not know when this started, for that has been constant, since he saw her today, usually awake alert x3, still driving, currently she is significantly confused, only alert to name. - in ED CT head with no acute finding, significant for old CVA, blood pressure elevated at 220/111, x-ray with no acute findings, UA still pending, no significant lab abnormalities, I was called to admit  Interim history  Admitted for confusion, likely due to HTN. PT recommending SNF.   Assessment & Plan   Acute metabolic encephalopathy -Resolved. Possibly related to hypertension -Currently she is alert and oriented x3 -CT head showed no acute findings -No focal deficits on exam -MRI: No acute intracranial normality.  No findings to suggest press or other acute finding.  Moderate cerebral white matter changes, microvascular ischemic disease.  Multiple superimposed remote lacunar infarcts.  Changes are mildly progressed relative to 2016. -UA unremarkable for infection -Chest Xray unremarkable for infection -Discussed MRI results with neurology, Dr. Amada Jupiter- recommended outpatient follow up and use of Aspirin 81mg  if no contraindications  Hypertensive urgency -BP initially 220/111 -Discussed with patient, she was taken off of amlodipine due to lower extremity swelling.  Was on lisinopril at home.  -Currently on lisinopril 20mg   daily; will given additional 20mg   -Continue hydralazine 25mg  TID -Continue hydralazine PRN  Tongue/lip discoloration/bruising -Patient will need to follow-up with her PCP  Diabetes mellitus, type II -Metformin held, continue insulin sliding scale and CBG monitoring  Hyperlipidemia -Continue statin  History of MS/unsteady gait/essential tremors -PT consulted, initially recommended home health however now recommending SNF -Have asked for PT to reevaluate patient today -Social work consulted -Follow-up with neurology  GERD -Continue PPI  ?Dysphagia with vomiting -Resolved -Speech therapy consulted and appreciated- regular, thin liquid. Instructed to take small bites and follow with liquid  Urinary retention -likely secondary to amantadine -currently able to urinate  Right foot pain -resolved -No edema or bruising on exam -R foot xrays negative for fracture. -No longer having pain  DVT Prophylaxis  heparin  Code Status: Full  Family Communication: None at bedside  Disposition Plan: Admitted. Pending SNF  Consultants Neurology   Procedures  None  Antibiotics   Anti-infectives (From admission, onward)   None      Subjective:   Jennamarie Mcfalls seen and examined today.  Feeling better today.  Has no complaints.  Denies any further right foot pain.  Denies chest pain, shortness of breath, abdominal pain, nausea or vomiting, diarrhea or constipation.  Objective:   Vitals:   12/23/18 2326 12/24/18 0458 12/24/18 0741 12/24/18 0946  BP: (!) 161/70 (!) 171/70 (!) 184/77 (!) 172/65  Pulse: 73 91 87   Resp: 18 17 16    Temp: 98 F (36.7 C) 98.3 F (36.8 C) 98 F (36.7 C)   TempSrc: Oral Oral Oral   SpO2: 98% 95% 96%   Weight:      Height:  No intake or output data in the 24 hours ending 12/24/18 1144 Filed Weights   12/19/18 2000 12/20/18 1538  Weight: 78 kg 78 kg   Exam  General: Well developed, well nourished, NAD, appears stated age   HEENT: NCAT,  mucous membranes moist.   Cardiovascular: S1 S2 auscultated, RRR, soft SEM  Respiratory: Clear to auscultation bilaterally with equal chest rise  Abdomen: Soft, nontender, nondistended, + bowel sounds  Extremities: warm dry without cyanosis clubbing or edema  Neuro: AAOx3, nonfocal,+ tremor  Psych: Appropriate mood and affect, pleasant  Data Reviewed: I have personally reviewed following labs and imaging studies  CBC: Recent Labs  Lab 12/19/18 1719 12/20/18 0425  WBC 10.5 8.4  NEUTROABS 8.7*  --   HGB 13.4 12.2  HCT 42.6 37.6  MCV 87.1 85.5  PLT 250 254   Basic Metabolic Panel: Recent Labs  Lab 12/19/18 1719 12/20/18 0425 12/24/18 1039  NA 140 140 137  K 3.5 3.7 4.3  CL 107 106 105  CO2 22 22 25   GLUCOSE 168* 140* 92  BUN 21 20 20   CREATININE 1.09* 1.07* 1.15*  CALCIUM 9.3 9.2 9.1   GFR: Estimated Creatinine Clearance: 45.3 mL/min (A) (by C-G formula based on SCr of 1.15 mg/dL (H)). Liver Function Tests: Recent Labs  Lab 12/19/18 1719  AST 24  ALT 23  ALKPHOS 117  BILITOT 0.6  PROT 6.1*  ALBUMIN 3.8   No results for input(s): LIPASE, AMYLASE in the last 168 hours. No results for input(s): AMMONIA in the last 168 hours. Coagulation Profile: Recent Labs  Lab 12/19/18 1719  INR 1.0   Cardiac Enzymes: No results for input(s): CKTOTAL, CKMB, CKMBINDEX, TROPONINI in the last 168 hours. BNP (last 3 results) No results for input(s): PROBNP in the last 8760 hours. HbA1C: No results for input(s): HGBA1C in the last 72 hours. CBG: Recent Labs  Lab 12/23/18 1110 12/23/18 1618 12/23/18 2102 12/24/18 0626 12/24/18 0737  GLUCAP 136* 142* 128* 122* 117*   Lipid Profile: No results for input(s): CHOL, HDL, LDLCALC, TRIG, CHOLHDL, LDLDIRECT in the last 72 hours. Thyroid Function Tests: No results for input(s): TSH, T4TOTAL, FREET4, T3FREE, THYROIDAB in the last 72 hours. Anemia Panel: No results for input(s): VITAMINB12, FOLATE,  FERRITIN, TIBC, IRON, RETICCTPCT in the last 72 hours. Urine analysis:    Component Value Date/Time   COLORURINE YELLOW 12/22/2018 1215   APPEARANCEUR HAZY (A) 12/22/2018 1215   LABSPEC 1.005 12/22/2018 1215   PHURINE 6.0 12/22/2018 1215   GLUCOSEU NEGATIVE 12/22/2018 1215   HGBUR NEGATIVE 12/22/2018 1215   BILIRUBINUR NEGATIVE 12/22/2018 1215   KETONESUR NEGATIVE 12/22/2018 1215   PROTEINUR NEGATIVE 12/22/2018 1215   UROBILINOGEN 0.2 11/19/2009 1055   NITRITE NEGATIVE 12/22/2018 1215   LEUKOCYTESUR NEGATIVE 12/22/2018 1215   Sepsis Labs: @LABRCNTIP (procalcitonin:4,lacticidven:4)  ) Recent Results (from the past 240 hour(s))  Culture, Urine     Status: Abnormal   Collection Time: 12/22/18  6:20 PM  Result Value Ref Range Status   Specimen Description URINE, CLEAN CATCH  Final   Special Requests   Final    NONE Performed at [facility], [address] [place], [place] [address]    Culture >=100,000 COLONIES/mL ESCHERICHIA COLI (A)  Final   Report Status 12/24/2018 FINAL  Final   Organism ID, Bacteria ESCHERICHIA COLI (A)  Final      Susceptibility   Escherichia coli - MIC*    AMPICILLIN >=32 RESISTANT Resistant     CEFAZOLIN  16 SENSITIVE Sensitive     CEFTRIAXONE <=1 SENSITIVE Sensitive     CIPROFLOXACIN 0.5 SENSITIVE Sensitive     GENTAMICIN <=1 SENSITIVE Sensitive     IMIPENEM <=0.25 SENSITIVE Sensitive     NITROFURANTOIN <=16 SENSITIVE Sensitive     TRIMETH/SULFA >=320 RESISTANT Resistant     AMPICILLIN/SULBACTAM >=32 RESISTANT Resistant     PIP/TAZO <=4 SENSITIVE Sensitive     Extended ESBL NEGATIVE Sensitive     * >=100,000 COLONIES/mL ESCHERICHIA COLI      Radiology Studies: Dg Foot 2 Views Right  Result Date: 12/22/2018 CLINICAL DATA:  Right foot pain EXAM: RIGHT FOOT - 2 VIEW COMPARISON:  None. FINDINGS: No acute fracture or dislocation is noted. Mild calcaneal spurring is seen. No other focal abnormality is seen. IMPRESSION: Mild degenerative  change without acute abnormality. Electronically Signed   By: Alcide Clever M.D.   On: 12/22/2018 20:17     Scheduled Meds: . atorvastatin  20 mg Oral Daily  . heparin  5,000 Units Subcutaneous Q8H  . hydrALAZINE  25 mg Oral Q8H  . insulin aspart  0-9 Units Subcutaneous TID WC  . lisinopril  20 mg Oral NOW  . [START ON 12/25/2018] lisinopril  40 mg Oral Daily  . multivitamin with minerals  1 tablet Oral Daily  . pantoprazole  40 mg Oral Daily  . sodium chloride flush  3 mL Intravenous Q12H   Continuous Infusions:   LOS: 5 days   Time Spent in minutes   30 minutes  Luberta Grabinski D.O. on 12/24/2018 at 11:44 AM  Between 7am to 7pm - Please see pager noted on [email or web address]  After 7pm go to [email or web address]  And look for the night coverage person covering for me after hours  [facility] Office  [phone]

## 2018-12-24 NOTE — [place] FL2 (Signed)
[place] MEDICAID FL2 LEVEL OF CARE SCREENING TOOL     IDENTIFICATION  Patient Name: Janice Little Birthdate: May 03, 1946 Sex: female Admission Date (Current Location): 12/19/2018  [facility] and [place] Number:  Producer, television/film/video and Address:  [facility], [address], [place], [place] [address]      Provider Number: [id number]  Attending Physician Name and Address:  Edsel Petrin, DO  Relative Name and Phone Number:  Champ Mungo, [phone]    Current Level of Care: Hospital Recommended Level of Care: Skilled Nursing Facility Prior Approval Number:    Date Approved/Denied: 12/24/18 PASRR Number: [id number]  Discharge Plan: SNF    Current Diagnoses: Patient Active Problem List   Diagnosis Date Noted  . Hypertensive urgency 12/19/2018  . History of myelitis 12/26/2017  . History of optic neuritis 04/02/2015  . Diplopia 04/02/2015  . Urinary incontinence 04/02/2015  . Other fatigue 04/02/2015  . 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 Numbness 04/02/2015  . Ataxic gait 04/02/2015  . Essential tremor 04/02/2015  . PRIMARY HYPERPARATHYROIDISM 02/01/2008  . OSTEOPOROSIS 02/01/2008  . Diabetes mellitus with diabetic neuropathy (HCC) 01/31/2008  . HYPERLIPIDEMIA 01/31/2008  . Multiple sclerosis (HCC) 01/31/2008  . Optic neuritis 01/31/2008  . HYPERTENSION 01/31/2008  . GERD 01/31/2008    Orientation RESPIRATION BLADDER Height & Weight     Self, Time, Situation, Place  Normal Continent Weight: 171 lb 15.3 oz (78 kg) Height:  5\' 6"  (167.6 cm)  BEHAVIORAL SYMPTOMS/MOOD NEUROLOGICAL BOWEL NUTRITION STATUS      Continent Diet(Carb mod/Heart Healthy, thin liquids)  AMBULATORY STATUS COMMUNICATION OF NEEDS Skin   Limited Assist Verbally (MASD on groin, dry skin)                       Personal Care Assistance Level of Assistance  Bathing, Feeding, Dressing, Total care Bathing Assistance:  Limited assistance Feeding assistance: Independent Dressing Assistance: Limited assistance Total Care Assistance: Limited assistance   Functional Limitations Info  Sight, Hearing, Speech Sight Info: Adequate Hearing Info: Adequate Speech Info: Adequate    SPECIAL CARE FACTORS FREQUENCY  PT (By licensed PT), OT (By licensed OT)     PT Frequency: 5x/wk OT Frequency: 5x/wk            Contractures Contractures Info: Not present    Additional Factors Info  Code Status, Insulin Sliding Scale, Allergies Code Status Info: Full Allergies Info: No Known Allergies   Insulin Sliding Scale Info: insulin aspart novolog 0-9 units 3x w/meals       Current Medications (12/24/2018):  This is the current hospital active medication list Current Facility-Administered Medications  Medication Dose Route Frequency Provider Last Rate Last Dose  . acetaminophen (TYLENOL) tablet 650 mg  650 mg Oral Q6H PRN Elgergawy, Leana Roe, MD       Or  . acetaminophen (TYLENOL) suppository 650 mg  650 mg Rectal Q6H PRN Elgergawy, Dawood S, MD      . albuterol (PROVENTIL) (2.5 MG/3ML) 0.083% nebulizer solution 2.5 mg  2.5 mg Nebulization Q2H PRN Elgergawy, Leana Roe, MD      . atorvastatin (LIPITOR) tablet 20 mg  20 mg Oral Daily Elgergawy, Leana Roe, MD   20 mg at 12/24/18 0841  . heparin injection 5,000 Units  5,000 Units Subcutaneous Q8H Elgergawy, Leana Roe, MD   5,000 Units at 12/24/18 0527  . hydrALAZINE (APRESOLINE) injection 10 mg  10 mg Intravenous Q4H PRN Elgergawy,  Leana Roe, MD   10 mg at 12/20/18 1959  . hydrALAZINE (APRESOLINE) tablet 25 mg  25 mg Oral Q8H Mikhail, [place], DO   25 mg at 12/24/18 8921  . insulin aspart (novoLOG) injection 0-9 Units  0-9 Units Subcutaneous TID WC Elgergawy, Leana Roe, MD   1 Units at 12/23/18 1745  . labetalol (NORMODYNE,TRANDATE) injection 10 mg  10 mg Intravenous Q2H PRN Aroor, Dara Lords, MD   10 mg at 12/22/18 0550  . lip balm (CARMEX) ointment   Topical PRN Edsel Petrin, DO      . lisinopril (PRINIVIL,ZESTRIL) tablet 20 mg  20 mg Oral NOW Mikhail, [place], DO      . [START ON 12/25/2018] lisinopril (PRINIVIL,ZESTRIL) tablet 40 mg  40 mg Oral Daily Mikhail, [place], DO      . multivitamin with minerals tablet 1 tablet  1 tablet Oral Daily Elgergawy, Leana Roe, MD   1 tablet at 12/24/18 [phone]  . oxyCODONE (Oxy IR/ROXICODONE) immediate release tablet 5 mg  5 mg Oral Q4H PRN Elgergawy, Leana Roe, MD   5 mg at 12/24/18 0945  . pantoprazole (PROTONIX) EC tablet 40 mg  40 mg Oral Daily Elgergawy, Leana Roe, MD   40 mg at 12/24/18 0841  . sodium chloride flush (NS) 0.9 % injection 3 mL  3 mL Intravenous Q12H Elgergawy, Leana Roe, MD   3 mL at 12/24/18 7408     Discharge Medications: Please see discharge summary for a list of discharge medications.  Relevant Imaging Results:  Relevant Lab Results:   Additional Information SSI: [id number]  Nada Boozer Rozelia Catapano, LCSWA

## 2018-12-24 NOTE — Plan of Care (Signed)
Discussed with patient plan of care for the evening, pain management, not ambulating to the bathroom without call for assistance and her as needed medications for high blood pressures with some teach back displayed

## 2018-12-24 NOTE — Progress Notes (Signed)
  Speech Language Pathology Treatment: Dysphagia  Patient Details Name: Janice Little MRN: [id number] DOB: 1946/06/13 Today's Date: 12/24/2018 Time: 9290-9030 SLP Time Calculation (min) (ACUTE ONLY): 10 min  Assessment / Plan / Recommendation Clinical Impression  Pt denies any worsening dysphagia over the weekend.  She continues to tolerate po well with good intake.  Advised her to monitor "bubble' and if liquid backflows and causes cough to advise MD.  Also if recurrent pnas start presenting are present to advise MD.   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   Educated pt to s/s of dysphagia, aspiration and esophageal precautions and mitigation strategies for swallowing medications/etc.    HPI HPI: 73 [facility] found her confused.  PMH + for MS, imaging study negative for PRES or acute event.  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  She also has h/o reflux but denies this being an issue in hospital.  Initially passed [facility] swallow screen but today she presented with globus, nausea and vomiting.  She denies choking but RN states she complained of choking prior to vomiting incident.        SLP Plan  All goals met       Recommendations  Diet recommendations: Regular;Thin liquid Liquids provided via: Cup Medication Administration: Whole meds with puree(start and follow with liquids) Supervision: Patient able to self feed Compensations: Slow rate;Small sips/bites;Follow solids with liquid Postural Changes and/or Swallow Maneuvers: Seated upright 90 degrees;Upright 30-60 min after meal                Oral Care Recommendations: Oral care BID Follow up Recommendations: Home health SLP SLP Visit Diagnosis: Dysphagia, unspecified (R13.10);Dysphagia, pharyngoesophageal phase (R13.14) Plan: All goals met       GO                Macario Golds 12/24/2018, 8:56  AM Luanna Salk, MS [place] Pager [phone] Office [phone]

## 2018-12-25 LAB — GLUCOSE, CAPILLARY
Glucose-Capillary: 126 mg/dL — ABNORMAL HIGH (ref 70–99)
Glucose-Capillary: 128 mg/dL — ABNORMAL HIGH (ref 70–99)
Glucose-Capillary: 123 mg/dL — ABNORMAL HIGH (ref 70–99)
Glucose-Capillary: 188 mg/dL — ABNORMAL HIGH (ref 70–99)

## 2018-12-25 MED ORDER — LABETALOL HCL 100 MG PO TABS
100.0000 mg | ORAL_TABLET | Freq: Two times a day (BID) | ORAL | Status: DC
  Administered 2018-12-25 – 2018-12-26 (×3): 100 mg via ORAL
  Filled 2018-12-25 (×4): qty 1

## 2018-12-25 MED ORDER — HYDRALAZINE HCL 50 MG PO TABS
50.0000 mg | ORAL_TABLET | Freq: Three times a day (TID) | ORAL | Status: DC
  Administered 2018-12-25 – 2018-12-26 (×3): 50 mg via ORAL
  Filled 2018-12-25 (×3): qty 1

## 2018-12-25 MED ORDER — HYDROCHLOROTHIAZIDE 25 MG PO TABS
25.0000 mg | ORAL_TABLET | Freq: Every day | ORAL | Status: DC
  Administered 2018-12-25 – 2018-12-26 (×2): 25 mg via ORAL
  Filled 2018-12-25 (×2): qty 1

## 2018-12-25 NOTE — Progress Notes (Addendum)
PROGRESS NOTE    Janice Little  GDJ:[id number] DOB: 02-02-1946 DOA: 12/19/2018 PCP: Janice Rainier, MD   Brief Narrative:  HPI on 12/19/2018 by Janice Little  is a 73 y.o. female, past medical history of MS, ambulatory with cane, still driving, hypertension, diabetes mellitus, hyperlipidemia, she was brought by her son secondary to altered mental status, son is a Engineer, civil (consulting), he visited her every few weeks, she was noted to be very confused to him today so he brought her to ED, never had slurred speech, facial droop, focal deficits, tingling or numbness, does not know when this started, for that has been constant, since he saw her today, usually awake alert x3, still driving, currently she is significantly confused, only alert to name. - in ED CT head with no acute finding, significant for old CVA, blood pressure elevated at 220/111, x-ray with no acute findings, UA still pending, no significant lab abnormalities, I was called to admit  Interim history  Admitted for confusion, likely due to HTN. Pending better control of BP. PT recommending SNF.   Assessment & Plan   Acute metabolic encephalopathy -Resolved. Possibly related to hypertension -Currently she is alert and oriented x3 -CT head showed no acute findings -No focal deficits on exam -MRI: No acute intracranial normality.  No findings to suggest press or other acute finding.  Moderate cerebral white matter changes, microvascular ischemic disease.  Multiple superimposed remote lacunar infarcts.  Changes are mildly progressed relative to 2016. -UA unremarkable for infection -Chest Xray unremarkable for infection -Discussed MRI results with neurology, Janice Little- recommended outpatient follow up and use of Aspirin 81mg  if no contraindications  Hypertensive urgency -BP initially 220/111 -Discussed with patient, she was taken off of amlodipine due to lower extremity swelling.  Was on lisinopril 5mg  daily at  home.  -Increased lisinopril to 40mg  daily, hydralazine 50mg  TID -Adding HCTZ 25mg  daily -may consider starting labetalol orally -Continue hydralazine and labetalol PRN -will obtain renin/aldosterone, renal artery [place] to look for secondary causes  Tongue/lip discoloration/bruising -Patient will need to follow-up with her PCP  Diabetes mellitus, type II -Metformin held, continue insulin sliding scale and CBG monitoring  Hyperlipidemia -Continue statin  History of MS/unsteady gait/essential tremors -PT consulted, initially recommended home health however now recommending SNF -Social work consulted -Follow-up with neurology  GERD -Continue PPI  ?Dysphagia with vomiting -Resolved -Speech therapy consulted and appreciated- regular, thin liquid. Instructed to take small bites and follow with liquid  Urinary retention -likely secondary to amantadine -Resolved, currently able to urinate -per documentation from neurology in 2019, patient has had more problems with urinary incontinence   Right foot pain -resolved -No edema or bruising on exam -R foot xrays negative for fracture. -No longer having pain  DVT Prophylaxis  heparin  Code Status: Full  Family Communication: None at bedside  Disposition Plan: Admitted. Pending improvement in BP and pending SNF autj  Consultants Neurology   Procedures  None  Antibiotics   Anti-infectives (From admission, onward)   None      Subjective:   Janice Little seen and examined today.  Complaining of mild headache, located top and back of head. Denies current right foot pain. Denies chest pain, shortness of breath, abdominal pain, nausea, vomiting. Has some constipation.   Objective:   Vitals:   12/25/18 0000 12/25/18 0423 12/25/18 0648 12/25/18 0800  BP: (!) 186/76 (!) 172/65 (!) 196/78 (!) 161/62  Pulse: 98 94 89 (!) 102  Resp: 19 14  18  Temp: 98.6 F (37 C) 98.4 F (36.9 C)  98.1 F (36.7 C)  TempSrc: Oral Oral   Oral  SpO2: 95% 95%  96%  Weight:      Height:        Intake/Output Summary (Last 24 hours) at 12/25/2018 0933 Last data filed at 12/25/2018 0715 Gross per 24 hour  Intake 730 ml  Output 500 ml  Net 230 ml   Filed Weights   12/19/18 2000 12/20/18 1538  Weight: 78 kg 78 kg   Exam  General: Well developed, well nourished, NAD, appears stated age  HEENT: NCAT, mucous membranes moist.   Cardiovascular: S1 S2 auscultated, soft SEM, RRR  Respiratory: Clear to auscultation bilaterally with equal chest rise  Abdomen: Soft, nontender, nondistended, + bowel sounds  Extremities: warm dry without cyanosis clubbing or edema  Neuro: AAOx3, nonfocal, +tremor  Psych: Pleasant, appropriate mood and affect  Data Reviewed: I have personally reviewed following labs and imaging studies  CBC: Recent Labs  Lab 12/19/18 1719 12/20/18 0425  WBC 10.5 8.4  NEUTROABS 8.7*  --   HGB 13.4 12.2  HCT 42.6 37.6  MCV 87.1 85.5  PLT 250 254   Basic Metabolic Panel: Recent Labs  Lab 12/19/18 1719 12/20/18 0425 12/24/18 1039  NA 140 140 137  K 3.5 3.7 4.3  CL 107 106 105  CO2 22 22 25   GLUCOSE 168* 140* 92  BUN 21 20 20   CREATININE 1.09* 1.07* 1.15*  CALCIUM 9.3 9.2 9.1   GFR: Estimated Creatinine Clearance: 45.3 mL/min (A) (by C-G formula based on SCr of 1.15 mg/dL (H)). Liver Function Tests: Recent Labs  Lab 12/19/18 1719  AST 24  ALT 23  ALKPHOS 117  BILITOT 0.6  PROT 6.1*  ALBUMIN 3.8   No results for input(s): LIPASE, AMYLASE in the last 168 hours. No results for input(s): AMMONIA in the last 168 hours. Coagulation Profile: Recent Labs  Lab 12/19/18 1719  INR 1.0   Cardiac Enzymes: No results for input(s): CKTOTAL, CKMB, CKMBINDEX, TROPONINI in the last 168 hours. BNP (last 3 results) No results for input(s): PROBNP in the last 8760 hours. HbA1C: No results for input(s): HGBA1C in the last 72 hours. CBG: Recent Labs  Lab 12/24/18 0737 12/24/18 1155  12/24/18 1635 12/24/18 2136 12/25/18 0622  GLUCAP 117* 113* 120* 122* 126*   Lipid Profile: No results for input(s): CHOL, HDL, LDLCALC, TRIG, CHOLHDL, LDLDIRECT in the last 72 hours. Thyroid Function Tests: No results for input(s): TSH, T4TOTAL, FREET4, T3FREE, THYROIDAB in the last 72 hours. Anemia Panel: No results for input(s): VITAMINB12, FOLATE, FERRITIN, TIBC, IRON, RETICCTPCT in the last 72 hours. Urine analysis:    Component Value Date/Time   COLORURINE YELLOW 12/22/2018 1215   APPEARANCEUR HAZY (A) 12/22/2018 1215   LABSPEC 1.005 12/22/2018 1215   PHURINE 6.0 12/22/2018 1215   GLUCOSEU NEGATIVE 12/22/2018 1215   HGBUR NEGATIVE 12/22/2018 1215   BILIRUBINUR NEGATIVE 12/22/2018 1215   KETONESUR NEGATIVE 12/22/2018 1215   PROTEINUR NEGATIVE 12/22/2018 1215   UROBILINOGEN 0.2 11/19/2009 1055   NITRITE NEGATIVE 12/22/2018 1215   LEUKOCYTESUR NEGATIVE 12/22/2018 1215   Sepsis Labs: @LABRCNTIP (procalcitonin:4,lacticidven:4)  ) Recent Results (from the past 240 hour(s))  Culture, Urine     Status: Abnormal   Collection Time: 12/22/18  6:20 PM  Result Value Ref Range Status   Specimen Description URINE, CLEAN CATCH  Final   Special Requests   Final    NONE Performed at [facility],  [address] [place], [place] [address]    Culture >=100,000 COLONIES/mL ESCHERICHIA COLI (A)  Final   Report Status 12/24/2018 FINAL  Final   Organism ID, Bacteria ESCHERICHIA COLI (A)  Final      Susceptibility   Escherichia coli - MIC*    AMPICILLIN >=32 RESISTANT Resistant     CEFAZOLIN 16 SENSITIVE Sensitive     CEFTRIAXONE <=1 SENSITIVE Sensitive     CIPROFLOXACIN 0.5 SENSITIVE Sensitive     GENTAMICIN <=1 SENSITIVE Sensitive     IMIPENEM <=0.25 SENSITIVE Sensitive     NITROFURANTOIN <=16 SENSITIVE Sensitive     TRIMETH/SULFA >=320 RESISTANT Resistant     AMPICILLIN/SULBACTAM >=32 RESISTANT Resistant     PIP/TAZO <=4 SENSITIVE Sensitive     Extended ESBL NEGATIVE  Sensitive     * >=100,000 COLONIES/mL ESCHERICHIA COLI      Radiology Studies: No results found.   Scheduled Meds: . atorvastatin  20 mg Oral Daily  . heparin  5,000 Units Subcutaneous Q8H  . hydrALAZINE  50 mg Oral Q8H  . hydrochlorothiazide  25 mg Oral Daily  . insulin aspart  0-9 Units Subcutaneous TID WC  . lisinopril  40 mg Oral Daily  . multivitamin with minerals  1 tablet Oral Daily  . pantoprazole  40 mg Oral Daily  . polyethylene glycol  17 g Oral Daily  . sodium chloride flush  3 mL Intravenous Q12H   Continuous Infusions:   LOS: 6 days   Time Spent in minutes   30 minutes  Janice Little D.O. on 12/25/2018 at 9:33 AM  Between 7am to 7pm - Please see pager noted on [email or web address]  After 7pm go to [email or web address]  And look for the night coverage person covering for me after hours  [facility] Office  [phone]

## 2018-12-25 NOTE — TOC Progression Note (Signed)
Transition of Care [facility]) - Progression Note    Patient Details  Name: Janice Little MRN: [id number] Date of Birth: 14-Jan-1946  [facility] CM/SW Contact  Baldemar Lenis, [place] Phone Number: 12/25/2018, 3:13 PM  Clinical Narrative:  Patient agreeable to SNF placement, has chosen [facility] in [place]. They have received insurance authorization for patient to admit to SNF when medically stable.     Expected Discharge Plan: Skilled Nursing Facility Barriers to Discharge: Continued Medical Work up  Expected Discharge Plan and Services Expected Discharge Plan: Skilled Nursing Facility   Discharge Planning Services: CM Consult   Living arrangements for the past 2 months: Apartment(condo--ground level) Expected Discharge Date: 12/22/18                         Social Determinants of Health (SDOH) Interventions    Readmission Risk Interventions Readmission Risk Prevention Plan 12/24/2018  Post Dischage Appt Complete  Medication Screening Complete  Transportation Screening Complete  Some recent data might be hidden

## 2018-12-25 NOTE — Care Management Important Message (Signed)
Important Message  Patient Details  Name: Janice Little MRN: [id number] Date of Birth: [birth year]   Medicare Important Message Given:  Yes    Dorena Bodo 12/25/2018, 1:37 PM

## 2018-12-25 NOTE — Progress Notes (Signed)
Physical Therapy Treatment Patient Details Name: Janice Little MRN: [id number] DOB: Nov 11, 1945 Today's Date: 12/25/2018    History of Present Illness Janice Little is an 73 y.o. female with past medical history of multiple sclerosis, hypertension, diabetes mellitus, hyperlipidemia brought to emergency department after her son found patient very confused. In the ED pt's BP was in the 175 systolic range.  Imaging did not show any abnormal acute findings.    PT Comments    Patient received in bathroom being supervised by RN. Able to continue attempting to toilet and performed pericare in sitting with S by PT however was not fruitful in attempt for BM. Performed sit to stand from toilet with min guard and heavy UE pulling on grab bar, then able to ambulate to sink with RW and able to maintain standing balance at sink without UE support and min guard to wash hands and brush teeth. Tolerated gait training approximately 16f today with much improved gait pattern likely due to reduced foot pain but very easily fatigued and required Mod cues for way-finding/navigation in hallway. She was left sitting at EOB with bed alarm active and all needs otherwise met (chair alarm not working correctly).     Follow Up Recommendations  SNF     Equipment Recommendations  None recommended by PT    Recommendations for Other Services       Precautions / Restrictions Precautions Precautions: Fall Restrictions Weight Bearing Restrictions: No    Mobility  Bed Mobility               General bed mobility comments: OOB on commode   Transfers Overall transfer level: Needs assistance Equipment used: Rolling walker (2 wheeled) Transfers: Sit to/from Stand Sit to Stand: Min guard         General transfer comment: min guard for safety, heavy use of pulling on grab bar in bathroom   Ambulation/Gait Ambulation/Gait assistance: Min guard Gait Distance (Feet): 100 Feet Assistive device:  Rolling walker (2 wheeled) Gait Pattern/deviations: Step-through pattern;Trunk flexed;Decreased step length - right;Decreased step length - left Gait velocity: slower without ability to change speeds significantly   General Gait Details: much improved gait pattern today due to reduced pain in R foot but easily fatigued; Mod cues for navigation in hallway and to find her way back to her room    Stairs             Wheelchair Mobility    Modified Rankin (Stroke Patients Only)       Balance Overall balance assessment: Needs assistance Sitting-balance support: Feet supported Sitting balance-Leahy Scale: Good Sitting balance - Comments: able to maintain upright sitting at EOB today with distant S    Standing balance support: Bilateral upper extremity supported;During functional activity Standing balance-Leahy Scale: Fair Standing balance comment: Dependent on UE support                            Cognition Arousal/Alertness: Awake/alert Behavior During Therapy: WFL for tasks assessed/performed Overall Cognitive Status: No family/caregiver present to determine baseline cognitive functioning Area of Impairment: Problem solving;Memory                     Memory: Decreased short-term memory       Problem Solving: Slow processing;Requires verbal cues General Comments: improved cognition in general today however did require cues for finding her way back to her room at [facility]  Exercises      General Comments        Pertinent Vitals/Pain Pain Assessment: No/denies pain Pain Score: 0-No pain Pain Intervention(s): Monitored during session    Home Living                      Prior Function            PT Goals (current goals can now be found in the care plan section) Acute Rehab PT Goals Patient Stated Goal: back home PT Goal Formulation: With patient Time For Goal Achievement: 12/27/18 Potential to Achieve Goals: Good Progress  towards PT goals: Progressing toward goals    Frequency    Min 3X/week      PT Plan Current plan remains appropriate    Co-evaluation              AM-PAC PT "6 Clicks" Mobility   Outcome Measure  Help needed turning from your back to your side while in a flat bed without using bedrails?: None Help needed moving from lying on your back to sitting on the side of a flat bed without using bedrails?: A Little Help needed moving to and from a bed to a chair (including a wheelchair)?: A Little Help needed standing up from a chair using your arms (e.g., wheelchair or bedside chair)?: A Little Help needed to walk in hospital room?: A Little Help needed climbing 3-5 steps with a railing? : A Lot 6 Click Score: 18    End of Session   Activity Tolerance: Patient tolerated treatment well Patient left: in bed;with call bell/phone within reach;with bed alarm set(sitting at EOB since chair alarm not working in Psychologist, occupational )   PT Visit Diagnosis: Unsteadiness on feet (R26.81);Muscle weakness (generalized) (M62.81);History of falling (Z91.81)     Time: 9480-1655 PT Time Calculation (min) (ACUTE ONLY): 14 min  Charges:  $Gait Training: 8-22 mins                     Deniece Ree PT, DPT, CBIS  Supplemental Physical Therapist [place]    Pager [phone] Acute Rehab Office [phone]

## 2018-12-26 ENCOUNTER — Encounter (HOSPITAL_COMMUNITY): Payer: Medicare Other

## 2018-12-26 LAB — BASIC METABOLIC PANEL
Anion gap: 8 (ref 5–15)
BUN: 30 mg/dL — ABNORMAL HIGH (ref 8–23)
CO2: 25 mmol/L (ref 22–32)
Calcium: 9.1 mg/dL (ref 8.9–10.3)
Chloride: 104 mmol/L (ref 98–111)
Creatinine, Ser: 1.32 mg/dL — ABNORMAL HIGH (ref 0.44–1.00)
GFR calc Af Amer: 46 mL/min — ABNORMAL LOW (ref >60)
GFR calc non Af Amer: 40 mL/min — ABNORMAL LOW (ref >60)
Glucose, Bld: 158 mg/dL — ABNORMAL HIGH (ref 70–99)
Potassium: 4.1 mmol/L (ref 3.5–5.1)
Sodium: 137 mmol/L (ref 135–145)

## 2018-12-26 LAB — GLUCOSE, CAPILLARY
Glucose-Capillary: 129 mg/dL — ABNORMAL HIGH (ref 70–99)
Glucose-Capillary: 173 mg/dL — ABNORMAL HIGH (ref 70–99)

## 2018-12-26 MED ORDER — LISINOPRIL 20 MG PO TABS: 20.0000 mg | ORAL_TABLET | Freq: Every day | ORAL | Status: DC

## 2018-12-26 MED ORDER — LISINOPRIL 20 MG PO TABS: 20.0000 mg | ORAL_TABLET | Freq: Every day | ORAL | 0 refills | Status: DC

## 2018-12-26 MED ORDER — HYDRALAZINE HCL 50 MG PO TABS: 50.0000 mg | ORAL_TABLET | Freq: Three times a day (TID) | ORAL | 0 refills | Status: DC

## 2018-12-26 NOTE — Progress Notes (Signed)
Occupational Therapy Treatment Patient Details Name: Janice Little MRN: [id number] DOB: 11-Dec-1945 Today's Date: 12/26/2018    History of present illness Janice Little is an 73 y.o. female with past medical history of multiple sclerosis, hypertension, diabetes mellitus, hyperlipidemia brought to emergency department after her son found patient very confused. In the ED pt's BP was in the 220 systolic range.  Imaging did not show any abnormal acute findings.   OT comments  Pt presents supine in bed pleasant and willing to participate with therapy this AM. Pt performing room level functional mobility using RW with overall minguard assist. Pt standing at sink to perform x2 grooming ADL with minguard assist for standing balance; completed LB dressing with minA and min cues for safety during task completion. Feel POC remains appropriate at this time. Will continue to follow acutely to progress pt towards established OT goals.   Follow Up Recommendations  SNF    Equipment Recommendations  3 in 1 bedside commode          Precautions / Restrictions Precautions Precautions: Fall Restrictions Weight Bearing Restrictions: No       Mobility Bed Mobility Overal bed mobility: Needs Assistance Bed Mobility: Supine to Sit;Sit to Supine     Supine to sit: Supervision Sit to supine: Supervision   General bed mobility comments: for general safety  Transfers Overall transfer level: Needs assistance Equipment used: Rolling walker (2 wheeled) Transfers: Sit to/from Stand Sit to Stand: Min guard         General transfer comment: min guard for safety, VCs for safe hand placement; increased time/effort to stand    Balance Overall balance assessment: Needs assistance Sitting-balance support: Feet supported Sitting balance-Leahy Scale: Good     Standing balance support: Bilateral upper extremity supported;During functional activity Standing balance-Leahy Scale: Fair Standing  balance comment: able to static stand at sink with minguard for safety                           ADL either performed or assessed with clinical judgement   ADL Overall ADL's : Needs assistance/impaired     Grooming: Min guard;Standing;Oral care;Wash/dry face Grooming Details (indicate cue type and reason): minguard for standing balance             Lower Body Dressing: Min guard;Minimal assistance;Sit to/from stand Lower Body Dressing Details (indicate cue type and reason): pt doffing/donning new briefs; safety cues to return to sitting prior to removing briefs; overall minguard for standing balance             Functional mobility during ADLs: Min guard;Rolling walker                         Cognition Arousal/Alertness: Awake/alert Behavior During Therapy: WFL for tasks assessed/performed Overall Cognitive Status: No family/caregiver present to determine baseline cognitive functioning Area of Impairment: Problem solving                             Problem Solving: Slow processing;Requires verbal cues General Comments: pt continues to demonstrate improvements in cognition, slower processing noted and requires cues for safety with functional tasks        Exercises     Shoulder Instructions       General Comments      Pertinent Vitals/ Pain       Pain Assessment: Faces Faces Pain Scale:  Hurts a little bit Pain Location: headache Pain Descriptors / Indicators: Headache Pain Intervention(s): Premedicated before session  Home Living                                          Prior Functioning/Environment              Frequency  Min 2X/week        Progress Toward Goals  OT Goals(current goals can now be found in the care plan section)  Progress towards OT goals: Progressing toward goals  Acute Rehab OT Goals Patient Stated Goal: back home OT Goal Formulation: With patient Time For Goal Achievement:  01/04/19 Potential to Achieve Goals: Good ADL Goals Pt Will Perform Grooming: with modified independence;standing Pt Will Perform Lower Body Bathing: with modified independence;sit to/from stand Pt Will Perform Lower Body Dressing: with modified independence;sit to/from stand Pt Will Transfer to Toilet: with modified independence;ambulating Pt Will Perform Toileting - Clothing Manipulation and hygiene: with modified independence;sit to/from stand Pt Will Perform Tub/Shower Transfer: with supervision;ambulating;shower seat;3 in 1 Additional ADL Goal #1: Pt will demonstrate ability to perform 3 step path finding task with min cues.  Plan Discharge plan remains appropriate    Co-evaluation                 AM-PAC OT "6 Clicks" Daily Activity     Outcome Measure   Help from another person eating meals?: None Help from another person taking care of personal grooming?: A Little Help from another person toileting, which includes using toliet, bedpan, or urinal?: A Little Help from another person bathing (including washing, rinsing, drying)?: A Little Help from another person to put on and taking off regular upper body clothing?: None Help from another person to put on and taking off regular lower body clothing?: A Little 6 Click Score: 20    End of Session Equipment Utilized During Treatment: Gait belt;Rolling walker  OT Visit Diagnosis: Unsteadiness on feet (R26.81);Muscle weakness (generalized) (M62.81)   Activity Tolerance Patient tolerated treatment well   Patient Left in bed;with call bell/phone within reach;with bed alarm set   Nurse Communication Mobility status        Time: 9675-9163 OT Time Calculation (min): 16 min  Charges: OT General Charges $OT Visit: 1 Visit OT Treatments $Self Care/Home Management : 8-22 mins  Marcy Siren, OT Supplemental Rehabilitation Services Pager [phone] Office [phone]    Orlando Penner 12/26/2018, 10:11  AM

## 2018-12-26 NOTE — Progress Notes (Signed)
Physical Therapy Treatment Patient Details Name: Janice Little MRN: [id number] DOB: [birth year] Today's Date: 12/26/2018    History of Present Illness Janice Little is an 73 y.o. female with past medical history of multiple sclerosis, hypertension, diabetes mellitus, hyperlipidemia brought to emergency department after her son found patient very confused. In the ED pt's BP was in the 220 systolic range.  Imaging did not show any abnormal acute findings.    PT Comments    Pt making steady progress with functional mobility. Pt would continue to benefit from skilled physical therapy services at this time while admitted and after d/c to address the below listed limitations in order to improve overall safety and independence with functional mobility.    Follow Up Recommendations  SNF     Equipment Recommendations  None recommended by PT    Recommendations for Other Services       Precautions / Restrictions Precautions Precautions: Fall Restrictions Weight Bearing Restrictions: No    Mobility  Bed Mobility Overal bed mobility: Needs Assistance Bed Mobility: Supine to Sit     Supine to sit: Supervision Sit to supine: Supervision   General bed mobility comments: for general safety  Transfers Overall transfer level: Needs assistance Equipment used: Straight cane Transfers: Sit to/from Stand Sit to Stand: Min guard         General transfer comment: min gaurd for safety  Ambulation/Gait Ambulation/Gait assistance: Min guard Gait Distance (Feet): 200 Feet(200' x2 with standing rest break) Assistive device: Straight cane Gait Pattern/deviations: Step-through pattern;Decreased stride length;Drifts right/left Gait velocity: decreased   General Gait Details: pt with mild instability but no overt LOB or need for physical assistance, min guard for safety with use of her personal cane   Stairs             Wheelchair Mobility    Modified Rankin (Stroke  Patients Only)       Balance Overall balance assessment: Needs assistance Sitting-balance support: Feet supported Sitting balance-Leahy Scale: Good     Standing balance support: Single extremity supported Standing balance-Leahy Scale: Poor Standing balance comment: able to static stand at sink with minguard for safety                            Cognition Arousal/Alertness: Awake/alert Behavior During Therapy: WFL for tasks assessed/performed Overall Cognitive Status: Within Functional Limits for tasks assessed Area of Impairment: Problem solving                             Problem Solving: Slow processing;Requires verbal cues General Comments: cognition not formally assessed, but [facility] for general conversation throughout session      Exercises      General Comments        Pertinent Vitals/Pain Pain Assessment: No/denies pain Faces Pain Scale: Hurts a little bit Pain Location: headache Pain Descriptors / Indicators: Headache Pain Intervention(s): Premedicated before session    Home Living                      Prior Function            PT Goals (current goals can now be found in the care plan section) Acute Rehab PT Goals Patient Stated Goal: back home PT Goal Formulation: With patient Time For Goal Achievement: 12/27/18 Potential to Achieve Goals: Good Progress towards PT goals: Progressing toward goals  Frequency    Min 3X/week      PT Plan Current plan remains appropriate    Co-evaluation              AM-PAC PT "6 Clicks" Mobility   Outcome Measure  Help needed turning from your back to your side while in a flat bed without using bedrails?: None Help needed moving from lying on your back to sitting on the side of a flat bed without using bedrails?: A Little Help needed moving to and from a bed to a chair (including a wheelchair)?: A Little Help needed standing up from a chair using your arms (e.g.,  wheelchair or bedside chair)?: A Little Help needed to walk in hospital room?: A Little Help needed climbing 3-5 steps with a railing? : A Lot 6 Click Score: 18    End of Session Equipment Utilized During Treatment: Gait belt Activity Tolerance: Patient tolerated treatment well Patient left: in bed;with call bell/phone within reach;with bed alarm set(sitting EOB) Nurse Communication: Mobility status PT Visit Diagnosis: Unsteadiness on feet (R26.81);Muscle weakness (generalized) (M62.81);History of falling (Z91.81)     Time: 7048-8891 PT Time Calculation (min) (ACUTE ONLY): 17 min  Charges:  $Gait Training: 8-22 mins                     Deborah Chalk, [place], DPT  Acute Rehabilitation Services Pager [phone] Office [phone]     Janice Little 12/26/2018, 1:23 PM

## 2018-12-26 NOTE — TOC Transition Note (Signed)
Transition of Care [facility]) - CM/SW Discharge Note   Patient Details  Name: MAUDINE MCASKILL MRN: [id number] Date of Birth: [birth year]  Transition of Care [facility]) CM/SW Contact:  Baldemar Lenis, LCSW Phone Number: 12/26/2018, 11:44 AM   Clinical Narrative:   Nurse to call report to [phone], Room 207    Final next level of care: Skilled Nursing Facility Barriers to Discharge: No Barriers Identified   Patient Goals and CMS Choice Patient states their goals for this hospitalization and ongoing recovery are:: to have her mind be clearer      Discharge Placement              Patient chooses bed at: [facility] [place] Patient to be transferred to facility by: [facility] Name of family member notified: Self Patient and family notified of of transfer: 12/26/18  Discharge Plan and Services   Discharge Planning Services: CM Consult                      Social Determinants of Health (SDOH) Interventions     Readmission Risk Interventions Readmission Risk Prevention Plan 12/24/2018  Post Dischage Appt Complete  Medication Screening Complete  Transportation Screening Complete  Some recent data might be hidden

## 2018-12-26 NOTE — Discharge Summary (Signed)
Physician Discharge Summary  Janice Little DGL:[id number] DOB: May 27, 1946 DOA: 12/19/2018  PCP: Juluis Rainier, MD  Admit date: 12/19/2018 Discharge date: 12/26/2018  Admitted From: Home  Disposition:  SNF  Recommendations for Outpatient Follow-up and new medication changes:  1. Follow up with Dr. Zachery Dauer in 7 days.  2. Patient has been placed on lisinopril 20 mg daily, hydralazine 50 mg tid and hctz 25 mg daily. 3. Her discharge creatinine is 1,32, please follow renal panel next week.  4. Added aspirin and atorvastatin.  5. Discontinue amantadine due to urinary retention.   Home Health: Na   Equipment/Devices: Na    Discharge Condition: stable  CODE STATUS: full  Diet recommendation: Heart healthy and diabetic prudent.   Brief/Interim Summary: 73 year old female who presented with altered mentation and uncontrolled hypertension.  She does have history of multiple sclerosis, ambulatory dysfunction, hypertension, type 2 diabetes mellitus and dyslipidemia.  Patient was noted by her son to be acutely confused and disoriented, no frank focal neurologic deficits.  On her initial physical examination her blood pressure was 220/111, heart rate 100, respiratory rate 18, temperature 98.6, oxygen saturation 98%.  She was significantly confused and disoriented, unable to answer questions appropriately, her strength was intact in all 4 extremities, her lungs were clear to auscultation bilaterally, heart S1-S2 present and rhythmic, abdomen was soft, no lower extremities edema.  Sodium 140, potassium 3.5, chloride 107, bicarb 22, glucose 168, BUN 21, creatinine 1.0, white count 10.5, hemoglobin 13.4, hematocrit 42.6, platelets 250.  Urinalysis negative for infection.  Urine drug screen was negative.  Her head CT showed no acute abnormality, interval old right thalamic lacunar infarcts.  Mildly progressive atrophy and chronic small vessel white matter ischemic changes.  Her chest x-ray was hypoinflated,  no infiltrates.  Her EKG 110 bpm, normal axis, normal intervals, sinus rhythm with normal conduction, no significant ST segment or T wave changes.  Brain MRI with no acute changes.  Patient was admitted to the hospital with a working diagnosis of hypertensive encephalopathy.  1.  Hypertensive encephalopathy/ hypertensive urgency.  Patient was admitted to the medical ward, she was placed on a remote telemetry monitor, and received frequent neuro checks.  Her antihypertensive regimen was modified with increase lisinopril up to 40 mg daily, hydralazine 50 mg 3 times daily and 25 of hydrochlorothiazide daily. She also received labetalol 100 mg bid. Her blood pressure improved along with her encephalopathy.  Renin/aldosterone ratio pending, if persistent hypertension consider outpatient renal Doppler ultrasonography.  Her discharge blood pressure is 157/67. Her discharge creatinine is 1,32, please follow renal panel next week. To avoid renal side effects will decrease lisinopril to 20 mg at discharge.   2.  Type 2 diabetes mellitus.  Patient received insulin sliding scale for glucose coverage and monitoring, at discharge will resume metformin and januvia. Her fasting glucose at discharge is 158 mg/dl.   3.  History of multiple sclerosis.  Patient when seen by physical therapy, with recommendations to continue therapy at skilled nursing facility.  4.  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  Patient will start statin therapy and aspirin will be added to her regimen.  5.  Transitory urinary retention.  Suspected secondary to amantadine, currently has improved.  6. AKI. Suspected creatinine elevation due to ace inh, will need outpatient follow up as outpatient. Will decrease lisinopril to 20 mg from 40 mg. Discharge potassium is 4,1.   Discharge Diagnoses:  Active Problems:   Diabetes mellitus with diabetic neuropathy (HCC)   Multiple sclerosis (  HCC)   GERD   History of optic neuritis   Ataxic gait   Essential  tremor   History of myelitis   Hypertensive urgency    Discharge Instructions  Discharge Instructions    Discharge instructions   Complete by:  As directed    Patient will be discharged to home with home health services.  Patient will need to follow up with primary care provider within one week of discharge, discuss blood pressure management and repeat BMP.  Follow up with neurology. Patient should continue medications as prescribed.  Patient should follow a heart healthy/carb modified diet.     Allergies as of 12/26/2018   No Known Allergies     Medication List    STOP taking these medications   amantadine 100 MG capsule Commonly known as:  SYMMETREL   amLODipine 10 MG tablet Commonly known as:  NORVASC     TAKE these medications   aspirin EC 81 MG tablet Take 1 tablet (81 mg total) by mouth daily for 30 days.   atorvastatin 20 MG tablet Commonly known as:  LIPITOR Take 20 mg by mouth daily.   estradiol 0.5 MG tablet Commonly known as:  ESTRACE Take 0.5 mg by mouth daily.   hydrALAZINE 50 MG tablet Commonly known as:  APRESOLINE Take 1 tablet (50 mg total) by mouth every 8 (eight) hours for 30 days.   Januvia 50 MG tablet Generic drug:  sitaGLIPtin Take 50 mg by mouth daily.   lisinopril 20 MG tablet Commonly known as:  PRINIVIL,ZESTRIL Take 1 tablet (20 mg total) by mouth daily for 30 days. Start taking on:  December 27, 2018 What changed:    medication strength  how much to take   metFORMIN 500 MG 24 hr tablet Commonly known as:  GLUCOPHAGE-XR Take 1,000 mg by mouth 2 (two) times daily.   multivitamin capsule Take 1 capsule by mouth daily.   omeprazole 20 MG tablet Commonly known as:  PRILOSEC OTC Take 20 mg by mouth daily.   UNABLE TO FIND Incromega [id number]  (fish oils)      Follow-up Information    Juluis RainierBarnes, Elizabeth, MD. Schedule an appointment as soon as possible for a visit in 1 week(s).   Specialty:  Family Medicine Why:  Hospital follow  up Contact information: [address] [place] [address] [phone]        Asa LenteSater, Richard A, MD. Schedule an appointment as soon as possible for a visit in 2 week(s).   Specialty:  Neurology Why:  Hospital follow up Contact information: [address] [place] [address] [phone]          No Known Allergies  Consultations:     Procedures/Studies: Ct Head Wo Contrast  Result Date: 12/19/2018 CLINICAL DATA:  Altered mental status. Multiple sclerosis. EXAM: CT HEAD WITHOUT CONTRAST TECHNIQUE: Contiguous axial images were obtained from the base of the skull through the vertex without intravenous contrast. COMPARISON:  Brain MR dated 04/17/2015. FINDINGS: Brain: Mild-to-moderate enlargement of the ventricles and subarachnoid spaces. Mild-to-moderate patchy white matter low density in both cerebral hemispheres. 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 No intracranial hemorrhage, mass lesion or CT evidence of acute infarction. Vascular: No hyperdense vessel or unexpected calcification. Skull: Normal. Negative for fracture or focal lesion. Sinuses/Orbits: Unremarkable. Other: None. IMPRESSION: 1. No acute abnormality. 2. 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 3. Mildly progressive atrophy and chronic small vessel white matter ischemic changes and possible changes related to the patient's multiple sclerosis.  Electronically Signed   By: Beckie Salts M.D.   On: 12/19/2018 17:50   Mr Brain Wo Contrast (neuro Protocol)  Result Date: 12/19/2018 CLINICAL DATA:  Initial evaluation for acute altered mental status. History of multiple sclerosis. EXAM: MRI HEAD WITHOUT CONTRAST TECHNIQUE: Multiplanar, multiecho pulse sequences of the brain and surrounding structures were obtained without intravenous contrast. COMPARISON:  Prior head CT from earlier same day as well as previous brain MRI from 04/17/2015. FINDINGS: Brain: Examination moderately degraded by motion artifact.  Diffuse prominence of the CSF containing spaces compatible generalized age-related cerebral atrophy. Patchy and confluent T2/FLAIR hyperintensity within the periventricular and deep white matter both cerebral hemispheres, likely in part related to history of multiple sclerosis. A degree of chronic microvascular ischemic disease is suspected as well. Overall, changes are progressed relative to 2016. Scattered superimposed remote lacunar infarcts present within left basal ganglia, right thalamus, and hemispheric cerebral white matter. No convincing abnormal foci of restricted diffusion to suggest acute or subacute ischemia on this motion degraded exam. Few scattered mildly increased foci of diffusion abnormality felt to be most compatible with T2 shine through. No diffusion abnormality to suggest active demyelination. No evidence for acute or chronic intracranial hemorrhage. No imaging findings to suggest pressed. No mass lesion, midline shift or mass effect. No hydrocephalus. No extra-axial fluid collection. Vascular: Major intracranial vascular flow voids maintained. Skull and upper cervical spine: Craniocervical junction within normal limits. Visualized upper cervical spine grossly normal. Bone marrow signal intensity within normal limits. No appreciable scalp soft tissue abnormality. Sinuses/Orbits: Globes orbital soft tissues demonstrate no acute finding. Mild scattered mucosal thickening within the ethmoidal air cells. Paranasal sinuses are otherwise clear. Trace right mastoid effusion noted, of doubtful significance. Inner ear structures normal. Other: None. IMPRESSION: 1. Technically limited exam due to moderate motion degradation. 2. No acute intracranial abnormality. No findings to suggest PRES or other acute finding. 3. Moderate cerebral white matter changes, likely in part due to provided history of multiple sclerosis as well as chronic microvascular ischemic disease. Multiple superimposed remote lacunar  infarcts involving the left basal ganglia, right thalamus, and hemispheric cerebral white matter. Changes are mildly progressed relative to 2016. Electronically Signed   By: Rise Mu M.D.   On: 12/19/2018 23:26   Dg Chest Port 1 View  Result Date: 12/19/2018 CLINICAL DATA:  Altered mental status. EXAM: PORTABLE CHEST 1 VIEW COMPARISON:  Chest x-ray dated November 19, 2009. FINDINGS: The heart size and mediastinal contours are within normal limits. Both lungs are clear. The visualized skeletal structures are unremarkable. IMPRESSION: No active disease. Electronically Signed   By: Obie Dredge M.D.   On: 12/19/2018 19:37   Dg Foot 2 Views Right  Result Date: 12/22/2018 CLINICAL DATA:  Right foot pain EXAM: RIGHT FOOT - 2 VIEW COMPARISON:  None. FINDINGS: No acute fracture or dislocation is noted. Mild calcaneal spurring is seen. No other focal abnormality is seen. IMPRESSION: Mild degenerative change without acute abnormality. Electronically Signed   By: Alcide Clever M.D.   On: 12/22/2018 20:17       Subjective: Patient is feeling better, no nausea or vomiting, no confusion or disorientation. No nausea or vomiting.   Discharge Exam: Vitals:   12/26/18 0343 12/26/18 0740  BP: (!) 152/59 (!) 157/67  Pulse: 73 85  Resp: 16 18  Temp: 97.8 F (36.6 C) 98.3 F (36.8 C)  SpO2: 96% 94%   Vitals:   12/25/18 1941 12/25/18 2358 12/26/18 0343 12/26/18 0740  BP: Marland Kitchen)  155/66 134/65 (!) 152/59 (!) 157/67  Pulse: 78 78 73 85  Resp: Temp: 97.8 F (36.6 C) 97.7 F (36.5 C) 97.8 F (36.6 C) 98.3 F (36.8 C)  TempSrc: Oral Oral Oral Oral  SpO2: 94% 96% 96% 94%  Weight:      Height:        General: Not in pain or dyspnea Neurology: Awake and alert, non focal  E ENT: no pallor, no icterus, oral mucosa moist Cardiovascular: No JVD. S1-S2 present, rhythmic, no gallops, rubs, or murmurs. No lower extremity edema. Pulmonary: positive breath sounds bilaterally, adequate  air movement, no wheezing, rhonchi or rales. Gastrointestinal. Abdomen with no organomegaly, non tender, no rebound or guarding Skin. No rashes Musculoskeletal: no joint deformities   The results of significant diagnostics from this hospitalization (including imaging, microbiology, ancillary and laboratory) are listed below for reference.     Microbiology: Recent Results (from the past 240 hour(s))  Culture, Urine     Status: Abnormal   Collection Time: 12/22/18  6:20 PM  Result Value Ref Range Status   Specimen Description URINE, CLEAN CATCH  Final   Special Requests   Final    NONE Performed at [facility], [address], [place], [place] [address]    Culture >=100,000 COLONIES/mL ESCHERICHIA COLI (A)  Final   Report Status 12/24/2018 FINAL  Final   Organism ID, Bacteria ESCHERICHIA COLI (A)  Final      Susceptibility   Escherichia coli - MIC*    AMPICILLIN >=32 RESISTANT Resistant     CEFAZOLIN 16 SENSITIVE Sensitive     CEFTRIAXONE <=1 SENSITIVE Sensitive     CIPROFLOXACIN 0.5 SENSITIVE Sensitive     GENTAMICIN <=1 SENSITIVE Sensitive     IMIPENEM <=0.25 SENSITIVE Sensitive     NITROFURANTOIN <=16 SENSITIVE Sensitive     TRIMETH/SULFA >=320 RESISTANT Resistant     AMPICILLIN/SULBACTAM >=32 RESISTANT Resistant     PIP/TAZO <=4 SENSITIVE Sensitive     Extended ESBL NEGATIVE Sensitive     * >=100,000 COLONIES/mL ESCHERICHIA COLI     Labs: BNP (last 3 results) No results for input(s): BNP in the last 8760 hours. Basic Metabolic Panel: Recent Labs  Lab 12/19/18 1719 12/20/18 0425 12/24/18 1039 12/26/18 0435  NA 140 140 137 137  K 3.5 3.7 4.3 4.1  CL 107 106 105 104  CO2 GLUCOSE 168* 140* 92 158*  BUN 30*  CREATININE 1.09* 1.07* 1.15* 1.32*  CALCIUM 9.3 9.2 9.1 9.1   Liver Function Tests: Recent Labs  Lab 12/19/18 1719  AST 24  ALT 23  ALKPHOS 117  BILITOT 0.6  PROT 6.1*  ALBUMIN 3.8   No results for input(s): LIPASE,  AMYLASE in the last 168 hours. No results for input(s): AMMONIA in the last 168 hours. CBC: Recent Labs  Lab 12/19/18 1719 12/20/18 0425  WBC 10.5 8.4  NEUTROABS 8.7*  --   HGB 13.4 12.2  HCT 42.6 37.6  MCV 87.1 85.5  PLT 250 254   Cardiac Enzymes: No results for input(s): CKTOTAL, CKMB, CKMBINDEX, TROPONINI in the last 168 hours. BNP: Invalid input(s): POCBNP CBG: Recent Labs  Lab 12/25/18 0622 12/25/18 1119 12/25/18 1604 12/25/18 2132 12/26/18 0609  GLUCAP 126* 128* 123* 188* 129*   D-Dimer No results for input(s): DDIMER in the last 72 hours. Hgb A1c No results for input(s): HGBA1C in the last 72 hours. Lipid Profile No results  for input(s): CHOL, HDL, LDLCALC, TRIG, CHOLHDL, LDLDIRECT in the last 72 hours. Thyroid function studies No results for input(s): TSH, T4TOTAL, T3FREE, THYROIDAB in the last 72 hours.  Invalid input(s): FREET3 Anemia work up No results for input(s): VITAMINB12, FOLATE, FERRITIN, TIBC, IRON, RETICCTPCT in the last 72 hours. Urinalysis    Component Value Date/Time   COLORURINE YELLOW 12/22/2018 1215   APPEARANCEUR HAZY (A) 12/22/2018 1215   LABSPEC 1.005 12/22/2018 1215   PHURINE 6.0 12/22/2018 1215   GLUCOSEU NEGATIVE 12/22/2018 1215   HGBUR NEGATIVE 12/22/2018 1215   BILIRUBINUR NEGATIVE 12/22/2018 1215   KETONESUR NEGATIVE 12/22/2018 1215   PROTEINUR NEGATIVE 12/22/2018 1215   UROBILINOGEN 0.2 11/19/2009 1055   NITRITE NEGATIVE 12/22/2018 1215   LEUKOCYTESUR NEGATIVE 12/22/2018 1215   Sepsis Labs Invalid input(s): PROCALCITONIN,  WBC,  LACTICIDVEN Microbiology Recent Results (from the past 240 hour(s))  Culture, Urine     Status: Abnormal   Collection Time: 12/22/18  6:20 PM  Result Value Ref Range Status   Specimen Description URINE, CLEAN CATCH  Final   Special Requests   Final    NONE Performed at [facility], [address] [place], [place] [address]    Culture >=100,000 COLONIES/mL ESCHERICHIA COLI (A)   Final   Report Status 12/24/2018 FINAL  Final   Organism ID, Bacteria ESCHERICHIA COLI (A)  Final      Susceptibility   Escherichia coli - MIC*    AMPICILLIN >=32 RESISTANT Resistant     CEFAZOLIN 16 SENSITIVE Sensitive     CEFTRIAXONE <=1 SENSITIVE Sensitive     CIPROFLOXACIN 0.5 SENSITIVE Sensitive     GENTAMICIN <=1 SENSITIVE Sensitive     IMIPENEM <=0.25 SENSITIVE Sensitive     NITROFURANTOIN <=16 SENSITIVE Sensitive     TRIMETH/SULFA >=320 RESISTANT Resistant     AMPICILLIN/SULBACTAM >=32 RESISTANT Resistant     PIP/TAZO <=4 SENSITIVE Sensitive     Extended ESBL NEGATIVE Sensitive     * >=100,000 COLONIES/mL ESCHERICHIA COLI     Time coordinating discharge: 45 minutes  SIGNED:   Coralie Keens, MD  [facility] 12/26/2018, 10:41 AM

## 2018-12-27 LAB — ALDOSTERONE + RENIN ACTIVITY W/ RATIO
ALDO / PRA Ratio: 2.1 (ref 0.0–30.0)
Aldosterone: 2.4 ng/dL (ref 0.0–30.0)
PRA LC/MS/MS: 1.138 ng/mL/hr (ref 0.167–5.380)

## 2019-01-01 ENCOUNTER — Ambulatory Visit: Payer: Medicare Other | Admitting: Neurology

## 2019-02-28 ENCOUNTER — Inpatient Hospital Stay (HOSPITAL_COMMUNITY)
Admission: EM | Admit: 2019-02-28 | Discharge: 2019-03-06 | DRG: 041 | Disposition: A | Discharge status: Skilled Nursing Facility | Payer: Medicare Other | Attending: Internal Medicine | Admitting: Internal Medicine

## 2019-02-28 ENCOUNTER — Encounter (HOSPITAL_COMMUNITY): Payer: Self-pay

## 2019-02-28 ENCOUNTER — Emergency Department (HOSPITAL_COMMUNITY): Payer: Medicare Other

## 2019-02-28 DIAGNOSIS — E786 Lipoprotein deficiency: Secondary | ICD-10-CM | POA: Diagnosis present

## 2019-02-28 DIAGNOSIS — I63511 Cerebral infarction due to unspecified occlusion or stenosis of right middle cerebral artery: Principal | ICD-10-CM | POA: Diagnosis present

## 2019-02-28 DIAGNOSIS — I639 Cerebral infarction, unspecified: Secondary | ICD-10-CM | POA: Diagnosis present

## 2019-02-28 DIAGNOSIS — I6340 Cerebral infarction due to embolism of unspecified cerebral artery: Secondary | ICD-10-CM

## 2019-02-28 DIAGNOSIS — Z801 Family history of malignant neoplasm of trachea, bronchus and lung: Secondary | ICD-10-CM

## 2019-02-28 DIAGNOSIS — G8194 Hemiplegia, unspecified affecting left nondominant side: Secondary | ICD-10-CM | POA: Diagnosis present

## 2019-02-28 DIAGNOSIS — Z825 Family history of asthma and other chronic lower respiratory diseases: Secondary | ICD-10-CM

## 2019-02-28 DIAGNOSIS — E785 Hyperlipidemia, unspecified: Secondary | ICD-10-CM | POA: Diagnosis present

## 2019-02-28 DIAGNOSIS — M8580 Other specified disorders of bone density and structure, unspecified site: Secondary | ICD-10-CM | POA: Diagnosis present

## 2019-02-28 DIAGNOSIS — G250 Essential tremor: Secondary | ICD-10-CM | POA: Diagnosis present

## 2019-02-28 DIAGNOSIS — R29810 Facial weakness: Secondary | ICD-10-CM | POA: Diagnosis present

## 2019-02-28 DIAGNOSIS — N182 Chronic kidney disease, stage 2 (mild): Secondary | ICD-10-CM | POA: Diagnosis present

## 2019-02-28 DIAGNOSIS — E669 Obesity, unspecified: Secondary | ICD-10-CM | POA: Diagnosis present

## 2019-02-28 DIAGNOSIS — Z6832 Body mass index (BMI) 32.0-32.9, adult: Secondary | ICD-10-CM

## 2019-02-28 DIAGNOSIS — Z79899 Other long term (current) drug therapy: Secondary | ICD-10-CM

## 2019-02-28 DIAGNOSIS — R4701 Aphasia: Secondary | ICD-10-CM | POA: Diagnosis present

## 2019-02-28 DIAGNOSIS — I160 Hypertensive urgency: Secondary | ICD-10-CM | POA: Diagnosis present

## 2019-02-28 DIAGNOSIS — R4182 Altered mental status, unspecified: Secondary | ICD-10-CM | POA: Diagnosis not present

## 2019-02-28 DIAGNOSIS — I129 Hypertensive chronic kidney disease with stage 1 through stage 4 chronic kidney disease, or unspecified chronic kidney disease: Secondary | ICD-10-CM | POA: Diagnosis present

## 2019-02-28 DIAGNOSIS — Z8669 Personal history of other diseases of the nervous system and sense organs: Secondary | ICD-10-CM

## 2019-02-28 DIAGNOSIS — K760 Fatty (change of) liver, not elsewhere classified: Secondary | ICD-10-CM | POA: Diagnosis present

## 2019-02-28 DIAGNOSIS — G8929 Other chronic pain: Secondary | ICD-10-CM | POA: Diagnosis present

## 2019-02-28 DIAGNOSIS — W19XXXA Unspecified fall, initial encounter: Secondary | ICD-10-CM | POA: Diagnosis present

## 2019-02-28 DIAGNOSIS — R29818 Other symptoms and signs involving the nervous system: Secondary | ICD-10-CM

## 2019-02-28 DIAGNOSIS — L8990 Pressure ulcer of unspecified site, unspecified stage: Secondary | ICD-10-CM

## 2019-02-28 DIAGNOSIS — E1140 Type 2 diabetes mellitus with diabetic neuropathy, unspecified: Secondary | ICD-10-CM

## 2019-02-28 DIAGNOSIS — Z7984 Long term (current) use of oral hypoglycemic drugs: Secondary | ICD-10-CM

## 2019-02-28 DIAGNOSIS — Z818 Family history of other mental and behavioral disorders: Secondary | ICD-10-CM

## 2019-02-28 DIAGNOSIS — K219 Gastro-esophageal reflux disease without esophagitis: Secondary | ICD-10-CM | POA: Diagnosis present

## 2019-02-28 DIAGNOSIS — E1122 Type 2 diabetes mellitus with diabetic chronic kidney disease: Secondary | ICD-10-CM | POA: Diagnosis present

## 2019-02-28 DIAGNOSIS — Z8661 Personal history of infections of the central nervous system: Secondary | ICD-10-CM

## 2019-02-28 DIAGNOSIS — G35 Multiple sclerosis: Secondary | ICD-10-CM | POA: Diagnosis present

## 2019-02-28 DIAGNOSIS — E210 Primary hyperparathyroidism: Secondary | ICD-10-CM | POA: Diagnosis present

## 2019-02-28 DIAGNOSIS — N390 Urinary tract infection, site not specified: Secondary | ICD-10-CM

## 2019-02-28 DIAGNOSIS — I10 Essential (primary) hypertension: Secondary | ICD-10-CM

## 2019-02-28 DIAGNOSIS — R29712 NIHSS score 12: Secondary | ICD-10-CM | POA: Diagnosis present

## 2019-02-28 DIAGNOSIS — Z1159 Encounter for screening for other viral diseases: Secondary | ICD-10-CM

## 2019-02-28 DIAGNOSIS — R569 Unspecified convulsions: Secondary | ICD-10-CM | POA: Diagnosis present

## 2019-02-28 DIAGNOSIS — N179 Acute kidney failure, unspecified: Secondary | ICD-10-CM | POA: Diagnosis not present

## 2019-02-28 LAB — CBC WITH DIFFERENTIAL/PLATELET
Abs Immature Granulocytes: 0.07 10*3/uL (ref 0.00–0.07)
Basophils Absolute: 0.1 10*3/uL (ref 0.0–0.1)
Basophils Relative: 1 %
Eosinophils Absolute: 0 10*3/uL (ref 0.0–0.5)
Eosinophils Relative: 0 %
HCT: 43.4 % (ref 36.0–46.0)
Hemoglobin: 14.3 g/dL (ref 12.0–15.0)
Immature Granulocytes: 1 %
Lymphocytes Relative: 13 %
Lymphs Abs: 1.7 10*3/uL (ref 0.7–4.0)
MCH: 28.3 pg (ref 26.0–34.0)
MCHC: 32.9 g/dL (ref 30.0–36.0)
MCV: 85.8 fL (ref 80.0–100.0)
Monocytes Absolute: 0.7 10*3/uL (ref 0.1–1.0)
Monocytes Relative: 5 %
Neutro Abs: 10.7 10*3/uL — ABNORMAL HIGH (ref 1.7–7.7)
Neutrophils Relative %: 80 %
Platelets: 288 10*3/uL (ref 150–400)
RBC: 5.06 MIL/uL (ref 3.87–5.11)
RDW: 13.2 % (ref 11.5–15.5)
WBC: 13.2 10*3/uL — ABNORMAL HIGH (ref 4.0–10.5)
nRBC: 0 % (ref 0.0–0.2)

## 2019-02-28 LAB — COMPREHENSIVE METABOLIC PANEL WITH GFR
ALT: 20 U/L (ref 0–44)
AST: 34 U/L (ref 15–41)
Albumin: 3.7 g/dL (ref 3.5–5.0)
Alkaline Phosphatase: 96 U/L (ref 38–126)
Anion gap: 17 — ABNORMAL HIGH (ref 5–15)
BUN: 22 mg/dL (ref 8–23)
CO2: 18 mmol/L — ABNORMAL LOW (ref 22–32)
Calcium: 9.3 mg/dL (ref 8.9–10.3)
Chloride: 106 mmol/L (ref 98–111)
Creatinine, Ser: 1.2 mg/dL — ABNORMAL HIGH (ref 0.44–1.00)
GFR calc Af Amer: 52 mL/min — ABNORMAL LOW
GFR calc non Af Amer: 44 mL/min — ABNORMAL LOW
Glucose, Bld: 125 mg/dL — ABNORMAL HIGH (ref 70–99)
Potassium: 3.9 mmol/L (ref 3.5–5.1)
Sodium: 141 mmol/L (ref 135–145)
Total Bilirubin: 1.1 mg/dL (ref 0.3–1.2)
Total Protein: 6.1 g/dL — ABNORMAL LOW (ref 6.5–8.1)

## 2019-02-28 LAB — ETHANOL: Alcohol, Ethyl (B): <10 mg/dL (ref <10)

## 2019-02-28 LAB — CK: Total CK: 364 U/L — ABNORMAL HIGH (ref 38–234)

## 2019-02-28 LAB — TROPONIN I: Troponin I: <0.03 ng/mL (ref <0.03)

## 2019-02-28 NOTE — ED Notes (Signed)
Pt.'s son  Eston Esters ) [phone] updated on pt.'s condition and plan of care by RN.

## 2019-02-28 NOTE — ED Notes (Signed)
Patient transported to CT scan . 

## 2019-02-28 NOTE — ED Notes (Signed)
EDP ( Dr. Rubin Payor ) notified on pt.'s persistent hypertension .

## 2019-02-28 NOTE — ED Notes (Signed)
Pt son Eston Esters [phone]

## 2019-02-28 NOTE — ED Provider Notes (Signed)
[facility] EMERGENCY DEPARTMENT Provider Note   CSN: [id number] Arrival date & time: 02/28/19  2143    History   Chief Complaint Chief Complaint  Patient presents with  . Altered Mental Status    HPI Janice Little is a 73 y.o. female.     HPI Level 5 caveat due to altered mental status. Patient found on a wellness check on the floor in her bedroom.  Last seen by family on Monday with today being Thursday.  Brought in by EMS.  Has some confusion but able answer some questions.  Hypertensive.  Patient had apparently been laying on the floor unable to get up although the patient cannot tell me much of the history about what happened.  Does have a hematoma to left side of head.  Eyes will not cross the midline to the left. Past Medical History:  Diagnosis Date  . Chronic kidney disease (CKD), stage II (mild)   . Chronic pain   . DM type 2 (diabetes mellitus, type 2) (HCC)   . Fatty liver    per CT  . GERD (gastroesophageal reflux disease)   . History of hepatitis as a child    age 59 (jaundice)  . HTN (hypertension)   . Hyperlipidemia   . MS (multiple sclerosis) (HCC)   . Obesity   . Osteopenia   . Primary hyperparathyroidism (HCC)    s/p surgery removal of parathyroid (Dr. Sharl Ma, Dr. Gerrit Friends)  . Vitamin D deficiency     Patient Active Problem List   Diagnosis Date Noted  . Hypertensive urgency 12/19/2018  . History of myelitis 12/26/2017  . History of optic neuritis 04/02/2015  . Diplopia 04/02/2015  . Urinary incontinence 04/02/2015  . Other fatigue 04/02/2015  . 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 Numbness 04/02/2015  . Ataxic gait 04/02/2015  . Essential tremor 04/02/2015  . PRIMARY HYPERPARATHYROIDISM 02/01/2008  . OSTEOPOROSIS 02/01/2008  . Diabetes mellitus with diabetic neuropathy (HCC) 01/31/2008  . HYPERLIPIDEMIA 01/31/2008  . Multiple sclerosis (HCC) 01/31/2008  . Optic neuritis 01/31/2008  .  HYPERTENSION 01/31/2008  . GERD 01/31/2008    Past Surgical History:  Procedure Laterality Date  . ANTERIOR CRUCIATE LIGAMENT REPAIR Left   . VESICOVAGINAL FISTULA CLOSURE W/ TAH       OB History   No obstetric history on file.      Home Medications    Prior to Admission medications   Medication Sig Start Date End Date Taking? Authorizing Provider  atorvastatin (LIPITOR) 20 MG tablet Take 20 mg by mouth daily.    [provider]  estradiol (ESTRACE) 0.5 MG tablet Take 0.5 mg by mouth daily.    [provider]  hydrALAZINE (APRESOLINE) 50 MG tablet Take 1 tablet (50 mg total) by mouth every 8 (eight) hours for 30 days. 12/26/18 01/25/19  Arrien, York Ram, MD  JANUVIA 50 MG tablet Take 50 mg by mouth daily. 10/10/18   [provider]  lisinopril (PRINIVIL,ZESTRIL) 20 MG tablet Take 1 tablet (20 mg total) by mouth daily for 30 days. 12/27/18 01/26/19  Arrien, York Ram, MD  metFORMIN (GLUCOPHAGE-XR) 500 MG 24 hr tablet Take 1,000 mg by mouth 2 (two) times daily. 10/10/18   [provider]  Multiple Vitamin (MULTIVITAMIN) capsule Take 1 capsule by mouth daily.    [provider]  omeprazole (PRILOSEC OTC) 20 MG tablet Take 20 mg by mouth daily.    [provider]  UNABLE TO  FIND Incromega [id number]  (fish oils)    [provider]    Family History Family History  Problem Relation Age of Onset  . Mental illness Mother   . COPD Father   . Lung cancer Father     Social History Social History   Tobacco Use  . Smoking status: Never Smoker  . Smokeless tobacco: Never Used  Substance Use Topics  . Alcohol use: No  . Drug use: No     Allergies   Patient has no known allergies.   Review of Systems Review of Systems  Unable to perform ROS: Mental status change     Physical Exam Updated Vital Signs BP (!) 203/98   Pulse 97   Temp 97.7 F (36.5 C) (Temporal)   Resp 15   SpO2 97%   Physical Exam  Vitals signs and nursing note reviewed.  HENT:     Head:     Comments: Ecchymosis to left temporal area. Eyes:     Comments: Eyes will not cross the midline to the left.  Potential visual field cut on left side of the left eye also  Cardiovascular:     Rate and Rhythm: Regular rhythm.  Pulmonary:     Breath sounds: No wheezing, rhonchi or rales.  Abdominal:     Tenderness: There is no abdominal tenderness.  Neurological:     Mental Status: She is alert.   Good grip strength bilaterally.  Finger-nose may be slightly off on left side.   ED Treatments / Results  Labs (all labs ordered are listed, but only abnormal results are displayed) Labs Reviewed  COMPREHENSIVE METABOLIC PANEL - Abnormal; Notable for the following components:      Result Value   CO2 18 (*)    Glucose, Bld 125 (*)    Creatinine, Ser 1.20 (*)    Total Protein 6.1 (*)    GFR calc non Af Amer 44 (*)    GFR calc Af Amer 52 (*)    Anion gap 17 (*)    All other components within normal limits  CK - Abnormal; Notable for the following components:   Total CK 364 (*)    All other components within normal limits  CBC WITH DIFFERENTIAL/PLATELET - Abnormal; Notable for the following components:   WBC 13.2 (*)    Neutro Abs 10.7 (*)    All other components within normal limits  SARS CORONAVIRUS 2 (HOSPITAL ORDER, PERFORMED IN [facility])  ETHANOL  TROPONIN I  URINALYSIS, ROUTINE W REFLEX MICROSCOPIC  RAPID URINE DRUG SCREEN, HOSP PERFORMED    EKG EKG Interpretation  Date/Time:  Thursday Feb 28 2019 21:52:43 EDT Ventricular Rate:  104 PR Interval:    QRS Duration: 88 QT Interval:  344 QTC Calculation: 453 R Axis:     Text Interpretation:  Sinus rhythm Nonspecific T abnormalities, lateral leads Baseline wander in lead(s) V3 Confirmed by Benjiman CorePickering, Blaise Grieshaber [phone]) on 02/28/2019 10:17:02 PM   Radiology Ct Head Wo Contrast  Result Date: 02/28/2019 CLINICAL DATA:  Fall with hematoma to left  side of head EXAM: CT HEAD WITHOUT CONTRAST CT CERVICAL SPINE WITHOUT CONTRAST TECHNIQUE: Multidetector CT imaging of the head and cervical spine was performed following the standard protocol without intravenous contrast. Multiplanar CT image reconstructions of the cervical spine were also generated. COMPARISON:  MRI 12/19/2018, CT brain 12/19/2018, MRI 04/17/2015 FINDINGS: CT HEAD FINDINGS Brain: No acute territorial infarction, hemorrhage or intracranial mass. Mild atrophy. Moderate small vessel ischemic changes of  the white matter. 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 Stable ventricle size. Vascular: No hyperdense vessels.  Carotid vascular calcification Skull: Normal. Negative for fracture or focal lesion. Sinuses/Orbits: No acute finding. Other: None CT CERVICAL SPINE FINDINGS Alignment: No subluxation.  Facet alignment within normal limits Skull base and vertebrae: No acute fracture. No primary bone lesion or focal pathologic process. Soft tissues and spinal canal: No prevertebral fluid or swelling. No visible canal hematoma. Disc levels: Moderate degenerative changes at C4-C5, C5-C6 and C6-C7. Posterior disc osteophyte complex at multiple levels. Upper chest: Negative.  Mild carotid vascular calcification Other: None IMPRESSION: 1. No CT evidence for acute intracranial abnormality. Atrophy and small vessel ischemic changes of the white matter. 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 Degenerative changes of the cervical spine. No acute osseous abnormality. Electronically Signed   By: Jasmine Pang M.D.   On: 02/28/2019 23:16   Ct Cervical Spine Wo Contrast  Result Date: 02/28/2019 CLINICAL DATA:  Fall with hematoma to left side of head EXAM: CT HEAD WITHOUT CONTRAST CT CERVICAL SPINE WITHOUT CONTRAST TECHNIQUE: Multidetector CT imaging of the head and cervical spine was performed following the standard protocol  without intravenous contrast. Multiplanar CT image reconstructions of the cervical spine were also generated. COMPARISON:  MRI 12/19/2018, CT brain 12/19/2018, MRI 04/17/2015 FINDINGS: CT HEAD FINDINGS Brain: No acute territorial infarction, hemorrhage or intracranial mass. Mild atrophy. Moderate small vessel ischemic changes of the white matter. 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 Stable ventricle size. Vascular: No hyperdense vessels.  Carotid vascular calcification Skull: Normal. Negative for fracture or focal lesion. Sinuses/Orbits: No acute finding. Other: None CT CERVICAL SPINE FINDINGS Alignment: No subluxation.  Facet alignment within normal limits Skull base and vertebrae: No acute fracture. No primary bone lesion or focal pathologic process. Soft tissues and spinal canal: No prevertebral fluid or swelling. No visible canal hematoma. Disc levels: Moderate degenerative changes at C4-C5, C5-C6 and C6-C7. Posterior disc osteophyte complex at multiple levels. Upper chest: Negative.  Mild carotid vascular calcification Other: None IMPRESSION: 1. No CT evidence for acute intracranial abnormality. Atrophy and small vessel ischemic changes of the white matter. 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 Degenerative changes of the cervical spine. No acute osseous abnormality. Electronically Signed   By: Jasmine Pang M.D.   On: 02/28/2019 23:16   Dg Chest Portable 1 View  Result Date: 02/28/2019 CLINICAL DATA:  Altered mental status EXAM: PORTABLE CHEST 1 VIEW COMPARISON:  12/19/2018 FINDINGS: No acute opacity or pleural effusion. Mild scarring at the left base. Normal heart size. No pneumothorax. IMPRESSION: No active disease. Electronically Signed   By: Jasmine Pang M.D.   On: 02/28/2019 22:52    Procedures Procedures (including critical care time)  Medications Ordered in ED Medications - No data to display    Initial Impression / Assessment and Plan / ED Course  I have reviewed the triage vital signs and the nursing notes.  Pertinent labs & imaging results that were available during my care of the patient were reviewed by me and considered in my medical decision making (see chart for details).        Patient with fall.  Hematoma to left temple area.  However eyes will not cross to the left side.  Potential visual field cut.  Potential difficulty with finger-nose on left.  Creatinine  appears to be at baseline.  Afebrile.  Is hypertensive but until we know if patient has had a stroke will hold off on blood pressure control.  Had not had medicines.  Discussed with neurology.  I reviewed records and appears like there is a question of her diagnosis of multiple sclerosis.  Outpatient neurologist had thought that may not be MS may be chronic microvascular disease.  Will admit to hospitalist.  Final Clinical Impressions(s) / ED Diagnoses   Final diagnoses:  Fall, initial encounter  Altered mental status, unspecified altered mental status type  Focal neurological deficit    ED Discharge Orders    None       Benjiman Core, MD 02/28/19 2335

## 2019-02-28 NOTE — ED Triage Notes (Signed)
Pt comes via [facility], family called for welfare check, LSN on Monday, pt had a fall, unknown when, hematoma to L side of head, R sided gaze, generalized weakness. AxO x 2 to self and time. Pt also has hoarding situation at home and may need social worker consult.

## 2019-03-01 ENCOUNTER — Encounter (HOSPITAL_COMMUNITY): Payer: Self-pay | Admitting: Internal Medicine

## 2019-03-01 ENCOUNTER — Inpatient Hospital Stay (HOSPITAL_COMMUNITY): Payer: Medicare Other

## 2019-03-01 ENCOUNTER — Other Ambulatory Visit: Payer: Self-pay

## 2019-03-01 ENCOUNTER — Emergency Department (HOSPITAL_COMMUNITY): Payer: Medicare Other

## 2019-03-01 DIAGNOSIS — E786 Lipoprotein deficiency: Secondary | ICD-10-CM | POA: Diagnosis present

## 2019-03-01 DIAGNOSIS — R4182 Altered mental status, unspecified: Secondary | ICD-10-CM | POA: Diagnosis present

## 2019-03-01 DIAGNOSIS — I639 Cerebral infarction, unspecified: Secondary | ICD-10-CM | POA: Diagnosis not present

## 2019-03-01 DIAGNOSIS — E1122 Type 2 diabetes mellitus with diabetic chronic kidney disease: Secondary | ICD-10-CM | POA: Diagnosis present

## 2019-03-01 DIAGNOSIS — G250 Essential tremor: Secondary | ICD-10-CM | POA: Diagnosis present

## 2019-03-01 DIAGNOSIS — I129 Hypertensive chronic kidney disease with stage 1 through stage 4 chronic kidney disease, or unspecified chronic kidney disease: Secondary | ICD-10-CM | POA: Diagnosis present

## 2019-03-01 DIAGNOSIS — R29712 NIHSS score 12: Secondary | ICD-10-CM | POA: Diagnosis present

## 2019-03-01 DIAGNOSIS — I6389 Other cerebral infarction: Secondary | ICD-10-CM | POA: Diagnosis not present

## 2019-03-01 DIAGNOSIS — Z6832 Body mass index (BMI) 32.0-32.9, adult: Secondary | ICD-10-CM | POA: Diagnosis not present

## 2019-03-01 DIAGNOSIS — E1140 Type 2 diabetes mellitus with diabetic neuropathy, unspecified: Secondary | ICD-10-CM | POA: Diagnosis not present

## 2019-03-01 DIAGNOSIS — G8194 Hemiplegia, unspecified affecting left nondominant side: Secondary | ICD-10-CM | POA: Diagnosis present

## 2019-03-01 DIAGNOSIS — K760 Fatty (change of) liver, not elsewhere classified: Secondary | ICD-10-CM | POA: Diagnosis present

## 2019-03-01 DIAGNOSIS — N390 Urinary tract infection, site not specified: Secondary | ICD-10-CM | POA: Diagnosis not present

## 2019-03-01 DIAGNOSIS — I10 Essential (primary) hypertension: Secondary | ICD-10-CM | POA: Diagnosis not present

## 2019-03-01 DIAGNOSIS — N179 Acute kidney failure, unspecified: Secondary | ICD-10-CM | POA: Diagnosis not present

## 2019-03-01 DIAGNOSIS — I6340 Cerebral infarction due to embolism of unspecified cerebral artery: Secondary | ICD-10-CM | POA: Diagnosis not present

## 2019-03-01 DIAGNOSIS — Z1159 Encounter for screening for other viral diseases: Secondary | ICD-10-CM | POA: Diagnosis not present

## 2019-03-01 DIAGNOSIS — R569 Unspecified convulsions: Secondary | ICD-10-CM | POA: Diagnosis present

## 2019-03-01 DIAGNOSIS — I160 Hypertensive urgency: Secondary | ICD-10-CM

## 2019-03-01 DIAGNOSIS — M8580 Other specified disorders of bone density and structure, unspecified site: Secondary | ICD-10-CM | POA: Diagnosis present

## 2019-03-01 DIAGNOSIS — N182 Chronic kidney disease, stage 2 (mild): Secondary | ICD-10-CM | POA: Diagnosis present

## 2019-03-01 DIAGNOSIS — W19XXXA Unspecified fall, initial encounter: Secondary | ICD-10-CM | POA: Diagnosis present

## 2019-03-01 DIAGNOSIS — I63511 Cerebral infarction due to unspecified occlusion or stenosis of right middle cerebral artery: Secondary | ICD-10-CM | POA: Diagnosis present

## 2019-03-01 DIAGNOSIS — Z8669 Personal history of other diseases of the nervous system and sense organs: Secondary | ICD-10-CM | POA: Diagnosis not present

## 2019-03-01 DIAGNOSIS — E210 Primary hyperparathyroidism: Secondary | ICD-10-CM | POA: Diagnosis present

## 2019-03-01 DIAGNOSIS — E669 Obesity, unspecified: Secondary | ICD-10-CM | POA: Diagnosis not present

## 2019-03-01 DIAGNOSIS — G35 Multiple sclerosis: Secondary | ICD-10-CM | POA: Diagnosis not present

## 2019-03-01 DIAGNOSIS — R4701 Aphasia: Secondary | ICD-10-CM | POA: Diagnosis present

## 2019-03-01 DIAGNOSIS — E785 Hyperlipidemia, unspecified: Secondary | ICD-10-CM | POA: Diagnosis not present

## 2019-03-01 DIAGNOSIS — K219 Gastro-esophageal reflux disease without esophagitis: Secondary | ICD-10-CM | POA: Diagnosis present

## 2019-03-01 DIAGNOSIS — G8929 Other chronic pain: Secondary | ICD-10-CM | POA: Diagnosis present

## 2019-03-01 DIAGNOSIS — R29810 Facial weakness: Secondary | ICD-10-CM | POA: Diagnosis present

## 2019-03-01 LAB — COMPREHENSIVE METABOLIC PANEL
ALT: 19 U/L (ref 0–44)
AST: 29 U/L (ref 15–41)
Albumin: 3.5 g/dL (ref 3.5–5.0)
Alkaline Phosphatase: 90 U/L (ref 38–126)
Anion gap: 20 — ABNORMAL HIGH (ref 5–15)
BUN: 21 mg/dL (ref 8–23)
CO2: 20 mmol/L — ABNORMAL LOW (ref 22–32)
Calcium: 9.3 mg/dL (ref 8.9–10.3)
Chloride: 99 mmol/L (ref 98–111)
Creatinine, Ser: 1.1 mg/dL — ABNORMAL HIGH (ref 0.44–1.00)
GFR calc Af Amer: 57 mL/min — ABNORMAL LOW (ref >60)
GFR calc non Af Amer: 49 mL/min — ABNORMAL LOW (ref >60)
Glucose, Bld: 106 mg/dL — ABNORMAL HIGH (ref 70–99)
Potassium: 3.9 mmol/L (ref 3.5–5.1)
Sodium: 139 mmol/L (ref 135–145)
Total Bilirubin: 1.2 mg/dL (ref 0.3–1.2)
Total Protein: 5.7 g/dL — ABNORMAL LOW (ref 6.5–8.1)

## 2019-03-01 LAB — CBC
HCT: 41.5 % (ref 36.0–46.0)
Hemoglobin: 13.8 g/dL (ref 12.0–15.0)
MCH: 28.1 pg (ref 26.0–34.0)
MCHC: 33.3 g/dL (ref 30.0–36.0)
MCV: 84.5 fL (ref 80.0–100.0)
Platelets: 291 10*3/uL (ref 150–400)
RBC: 4.91 MIL/uL (ref 3.87–5.11)
RDW: 13.2 % (ref 11.5–15.5)
WBC: 12.8 10*3/uL — ABNORMAL HIGH (ref 4.0–10.5)
nRBC: 0 % (ref 0.0–0.2)

## 2019-03-01 LAB — GLUCOSE, CAPILLARY
Glucose-Capillary: 96 mg/dL (ref 70–99)
Glucose-Capillary: 95 mg/dL (ref 70–99)
Glucose-Capillary: 141 mg/dL — ABNORMAL HIGH (ref 70–99)
Glucose-Capillary: 143 mg/dL — ABNORMAL HIGH (ref 70–99)

## 2019-03-01 LAB — LIPID PANEL
Cholesterol: 123 mg/dL (ref 0–200)
HDL: 70 mg/dL (ref >40)
LDL Cholesterol: 37 mg/dL (ref 0–99)
Total CHOL/HDL Ratio: 1.8 RATIO
Triglycerides: 82 mg/dL (ref <150)
VLDL: 16 mg/dL (ref 0–40)

## 2019-03-01 LAB — ECHOCARDIOGRAM COMPLETE
Height: 60 in
Weight: 2624.36 oz

## 2019-03-01 LAB — HEMOGLOBIN A1C
Hgb A1c MFr Bld: 5.6 % (ref 4.8–5.6)
Mean Plasma Glucose: 114.02 mg/dL

## 2019-03-01 LAB — SARS CORONAVIRUS 2 BY RT PCR (HOSPITAL ORDER, PERFORMED IN ~~LOC~~ HOSPITAL LAB): SARS Coronavirus 2: NEGATIVE

## 2019-03-01 MED ORDER — HYDRALAZINE HCL 20 MG/ML IJ SOLN
10.0000 mg | INTRAMUSCULAR | Status: DC | PRN
  Filled 2019-03-01: qty 1

## 2019-03-01 MED ORDER — CLOPIDOGREL BISULFATE 300 MG PO TABS
300.0000 mg | ORAL_TABLET | Freq: Once | ORAL | Status: AC
  Administered 2019-03-01: 06:00:00 300 mg via ORAL
  Filled 2019-03-01: qty 1

## 2019-03-01 MED ORDER — ASPIRIN 325 MG PO TABS
325.0000 mg | ORAL_TABLET | Freq: Every day | ORAL | Status: DC
  Administered 2019-03-01 – 2019-03-06 (×6): 325 mg via ORAL
  Filled 2019-03-01 (×6): qty 1

## 2019-03-01 MED ORDER — ATORVASTATIN CALCIUM 10 MG PO TABS: 20.0000 mg | ORAL_TABLET | Freq: Every day | ORAL | Status: DC

## 2019-03-01 MED ORDER — ATORVASTATIN CALCIUM 80 MG PO TABS
80.0000 mg | ORAL_TABLET | Freq: Every day | ORAL | Status: DC
  Administered 2019-03-01: 80 mg via ORAL
  Filled 2019-03-01: qty 1

## 2019-03-01 MED ORDER — IOHEXOL 350 MG/ML SOLN
100.0000 mL | Freq: Once | INTRAVENOUS | Status: AC | PRN
  Administered 2019-03-01: 01:00:00 100 mL via INTRAVENOUS

## 2019-03-01 MED ORDER — ENOXAPARIN SODIUM 40 MG/0.4ML ~~LOC~~ SOLN
40.0000 mg | SUBCUTANEOUS | Status: DC
  Administered 2019-03-01 – 2019-03-06 (×6): 40 mg via SUBCUTANEOUS
  Filled 2019-03-01 (×6): qty 0.4

## 2019-03-01 MED ORDER — ACETAMINOPHEN 160 MG/5ML PO SOLN: 650.0000 mg | ORAL | Status: DC | PRN

## 2019-03-01 MED ORDER — STROKE: EARLY STAGES OF RECOVERY BOOK
Freq: Once | Status: AC
  Administered 2019-03-01: 06:00:00
  Filled 2019-03-01: qty 1

## 2019-03-01 MED ORDER — ASPIRIN 300 MG RE SUPP: 300.0000 mg | Freq: Every day | RECTAL | Status: DC

## 2019-03-01 MED ORDER — CLOPIDOGREL BISULFATE 75 MG PO TABS
75.0000 mg | ORAL_TABLET | Freq: Every day | ORAL | Status: DC
  Administered 2019-03-02 – 2019-03-06 (×5): 75 mg via ORAL
  Filled 2019-03-01 (×5): qty 1

## 2019-03-01 MED ORDER — SODIUM CHLORIDE 0.9 % IV BOLUS
1000.0000 mL | Freq: Once | INTRAVENOUS | Status: AC
  Administered 2019-03-01: 1000 mL via INTRAVENOUS

## 2019-03-01 MED ORDER — INSULIN ASPART 100 UNIT/ML ~~LOC~~ SOLN
0.0000 [IU] | Freq: Three times a day (TID) | SUBCUTANEOUS | Status: DC
  Administered 2019-03-01: 18:00:00 1 [IU] via SUBCUTANEOUS
  Administered 2019-03-02: 13:00:00 5 [IU] via SUBCUTANEOUS
  Administered 2019-03-03 (×2): 1 [IU] via SUBCUTANEOUS
  Administered 2019-03-03: 13:00:00 3 [IU] via SUBCUTANEOUS
  Administered 2019-03-04 (×2): 1 [IU] via SUBCUTANEOUS
  Administered 2019-03-05 (×3): 2 [IU] via SUBCUTANEOUS
  Administered 2019-03-06: 1 [IU] via SUBCUTANEOUS
  Administered 2019-03-06: 5 [IU] via SUBCUTANEOUS

## 2019-03-01 MED ORDER — ACETAMINOPHEN 325 MG PO TABS
650.0000 mg | ORAL_TABLET | ORAL | Status: DC | PRN
  Administered 2019-03-02 – 2019-03-06 (×7): 650 mg via ORAL
  Filled 2019-03-01 (×7): qty 2

## 2019-03-01 MED ORDER — SODIUM CHLORIDE 0.9 % IV SOLN
INTRAVENOUS | Status: DC
  Administered 2019-03-01: 04:00:00 via INTRAVENOUS

## 2019-03-01 MED ORDER — ACETAMINOPHEN 650 MG RE SUPP: 650.0000 mg | RECTAL | Status: DC | PRN

## 2019-03-01 NOTE — Progress Notes (Signed)
Patient seen and examined. Admitted after midnight secondary to confusion and mechanical fall at home. Despite being oriented X2 and following commands appropriately, patient found with left hemianopsia and 4/5 MS on her left side. CT head with old small lacunar infarcts, but not other acute intracranial abnormalities. Patient admitted for TIA VS CVA rule out. Please refer to H&P written by Dr. Toniann Fail for further info/details on admission.   Vassie Loll MD [phone]

## 2019-03-01 NOTE — Consult Note (Signed)
Neurology Consultation Reason for Consult: Eye movement abnormalities Referring Physician: Ruel Favors  CC: Fall  History is obtained from: Patient  HPI: Janice Little is a 73 y.o. female who states that she fell and hit her head, though she is unclear how long she was down for.  She thinks that likely it was 5/27, but when I ask her was it hours or over a day, she is not certain which.  She states that she is not sure what caused her to fall or what caused her to not be able to get back up.  Her family found her after having a seizure since Monday.   LKW: Unclear tpa given?: no, unclear time of onset   ROS: A 14 point ROS was performed and is negative except as noted in the HPI.   Past Medical History:  Diagnosis Date  . Chronic kidney disease (CKD), stage II (mild)   . Chronic pain   . DM type 2 (diabetes mellitus, type 2) (HCC)   . Fatty liver    per CT  . GERD (gastroesophageal reflux disease)   . History of hepatitis as a child    age 47 (jaundice)  . HTN (hypertension)   . Hyperlipidemia   . MS (multiple sclerosis) (HCC)   . Obesity   . Osteopenia   . Primary hyperparathyroidism (HCC)    s/p surgery removal of parathyroid (Dr. Sharl Ma, Dr. Gerrit Friends)  . Vitamin D deficiency      Family History  Problem Relation Age of Onset  . Mental illness Mother   . COPD Father   . Lung cancer Father      Social History:  reports that she has never smoked. She has never used smokeless tobacco. She reports that she does not drink alcohol or use drugs.   Exam: Current vital signs: BP (!) 187/91   Pulse 97   Temp 98.3 F (36.8 C) (Oral)   Resp 16   Wt 74.4 kg   SpO2 100%   BMI 26.47 kg/m  Vital signs in last 24 hours: Temp:  [97.7 F (36.5 C)-98.3 F (36.8 C)] 98.3 F (36.8 C) (05/29 0245) Pulse Rate:  [92-104] 97 (05/29 0246) Resp:  [14-21] 16 (05/29 0245) BP: (156-215)/(81-101) 187/91 (05/29 0246) SpO2:  [96 %-100 %] 100 % (05/29 0245) Weight:  [74.4 kg]  74.4 kg (05/29 0251)   Physical Exam  Constitutional: Appears well-developed and well-nourished.  Psych: Affect appropriate to situation Eyes: No scleral injection HENT: No OP obstrucion Head: Normocephalic.  Cardiovascular: Normal rate and regular rhythm.  Respiratory: Effort normal, non-labored breathing GI: Soft.  No distension. There is no tenderness.  Skin: WDI  Neuro: Mental Status: Patient is awake, alert, oriented to person, place, month, year, and situation. She has a significant left neglect Cranial Nerves: II: Left hemianopia. Pupils are equal, round, and reactive to light.   III,IV, VI: She has a right gaze preference, though she does track across midline to the left V: Facial sensation is symmetric to temperature VII: Facial movement with very mild left facial weakness VIII: hearing is intact to voice X: Uvula elevates symmetrically XI: Shoulder shrug is symmetric. XII: tongue is midline without atrophy or fasciculations.  Motor: She has 4/5 strength of the left arm and leg, full strength in the right Sensory: Sensation is diminished on the left, though she is not aware of this diminishment.  When I touch her it takes more stimulation on the left than right for her to  be able to tell she is being touched.  She extinguishes to double simultaneous stimulation. Cerebellar: Consistent with weakness on left   I have reviewed labs in epic and the results pertinent to this consultation are: Creatinine 1.2  I have reviewed the images obtained: CT- mild changes in the posterior temporal region, CTA- high-grade stenosis of the right M2, 18 mL perfusion abnormality.  Impression: 73 year old female with unknown onset of mild left weakness which I suspect caused her fall.  It does appear that she has some early CT change and has an 18 mL perfusion deficit.  Given relatively mild symptoms, the fact that it is not completely occluded, and the fact that there is already some CT  change in the setting of relatively small penumbra, and the fact that her last seen well was Monday, I do not think that she is a thrombectomy candidate at this time.  Recommendations: - HgbA1c, fasting lipid panel - MRI  of the brain without contrast - Frequent neuro checks - Echocardiogram - Prophylactic therapy-Antiplatelet med: Aspirin - dose 325mg  PO or 300mg  PR and Plavix 75 mg daily following 3 to milligrams load - Risk factor modification - Telemetry monitoring - PT consult, OT consult, Speech consult - Stroke team to follow    Ritta Slot, MD [facility] [phone]  If 7pm- 7am, please page neurology on call as listed in AMION.

## 2019-03-01 NOTE — Progress Notes (Signed)
STROKE TEAM PROGRESS NOTE   HISTORY OF PRESENT ILLNESS (per Dr Amada Jupiter) Janice Little is a 73 y.o. female who presented to the ER on 02/28/19 for a fall w/head trauma on 5/27. It is unclear how long she was down for. She was found by family after having a seizure.      SUBJECTIVE (INTERVAL HISTORY) Pt has become quite aphasic. She doesn't attend to left side and poorly interacts. She has no insight to situation.     OBJECTIVE Vitals:   03/01/19 0857 03/01/19 0900 03/01/19 1100 03/01/19 1300  BP: (!) 172/84 (!) 172/84 (!) 148/87 (!) 161/78  Pulse: 82 80 86 83  Resp: 13 12 13 14   Temp: 97.8 F (36.6 C)     TempSrc: Oral     SpO2: 95% 96% 97% 93%  Weight:      Height:        CBC:  Recent Labs  Lab 02/28/19 2210 03/01/19 0247  WBC 13.2* 12.8*  NEUTROABS 10.7*  --   HGB 14.3 13.8  HCT 43.4 41.5  MCV 85.8 84.5  PLT 288 291    Basic Metabolic Panel:  Recent Labs  Lab 02/28/19 2210 03/01/19 0247  NA 141 139  K 3.9 3.9  CL 106 99  CO2 18* 20*  GLUCOSE 125* 106*  BUN 22 21  CREATININE 1.20* 1.10*  CALCIUM 9.3 9.3    Lipid Panel:     Component Value Date/Time   CHOL 123 03/01/2019 0247   TRIG 82 03/01/2019 0247   HDL 70 03/01/2019 0247   CHOLHDL 1.8 03/01/2019 0247   VLDL 16 03/01/2019 0247   LDLCALC 37 03/01/2019 0247   HgbA1c:  Lab Results  Component Value Date   HGBA1C 5.6 03/01/2019   Urine Drug Screen:     Component Value Date/Time   LABOPIA NONE DETECTED 12/20/2018 1850   COCAINSCRNUR NONE DETECTED 12/20/2018 1850   LABBENZ NONE DETECTED 12/20/2018 1850   AMPHETMU NONE DETECTED 12/20/2018 1850   THCU NONE DETECTED 12/20/2018 1850   LABBARB NONE DETECTED 12/20/2018 1850    Alcohol Level     Component Value Date/Time   ETH <10 02/28/2019 2210    IMAGING   Ct Angio Head W Or Wo Contrast  Result Date: 03/01/2019 CLINICAL DATA:  Initial evaluation for acute altered mental status, possible visual field cut. EXAM: CT ANGIOGRAPHY  HEAD AND NECK CT PERFUSION BRAIN TECHNIQUE: Multidetector CT imaging of the head and neck was performed using the standard protocol during bolus administration of intravenous contrast. Multiplanar CT image reconstructions and MIPs were obtained to evaluate the vascular anatomy. Carotid stenosis measurements (when applicable) are obtained utilizing NASCET criteria, using the distal internal carotid diameter as the denominator. Multiphase CT imaging of the brain was performed following IV bolus contrast injection. Subsequent parametric perfusion maps were calculated using RAPID software. CONTRAST:  OMNIPAQUE IOHEXOL 350 MG/ML SOLN COMPARISON:  Prior CT from earlier the same evening. FINDINGS: CT HEAD FINDINGS Brain: Generalized age-related cerebral atrophy with moderate chronic small vessel ischemic disease. Multiple chronic lacunar infarcts noted within the bilateral basal ganglia and right thalamus. Extremely subtle loss of gray-white matter differentiation with evolving hypodensity seen at the posterior right temporal region (series 5, image 16), suspicious for of all vein posterior right MCA territory infarct. This correlates with area of ischemic penumbra on concomitant perfusion exam. Otherwise, gray-white matter differentiation maintained with no other acute large vessel territory infarct. No acute intracranial hemorrhage. No mass lesion, midline shift or  mass effect. No hydrocephalus. No extra-axial fluid collection. Vascular: Single focal hyperdensity involving a proximal right M2 branch, favored to reflect focal atherosclerotic plaque rather than thrombus (series 8, image 20). No other hyperdense vessel. Scattered vascular calcifications noted within the carotid siphons. Skull: Scalp soft tissues and calvarium within normal limits. Sinuses/Orbits: Globes and orbital soft tissues within normal limits. Paranasal sinuses are largely clear. No mastoid effusion. Other: None. ASPECTS ([place]berta Stroke Program  Early CT Score) - Ganglionic level infarction (caudate, lentiform nuclei, internal capsule, insula, M1-M3 cortex): 6 - Supraganglionic infarction (M4-M6 cortex): 3 Total score (0-10 with 10 being normal): 9 Review of the MIP images confirms the above findings CTA NECK FINDINGS Aortic arch: Visualized aortic arch of normal caliber with normal 3 vessel morphology. Mild scattered atheromatous plaque about the arch and origin of the great vessels without hemodynamically significant stenosis. Visualized subclavian arteries widely patent. Right carotid system: Right common carotid artery patent from its origin to the bifurcation without stenosis. Mixed plaque about the right bifurcation/proximal right ICA without hemodynamically significant stenosis. Right ICA widely patent distally to the skull base without stenosis, dissection or occlusion. Left carotid system: Left common carotid artery patent from its origin to the bifurcation without stenosis. No significant atheromatous change or narrowing about the left bifurcation. Left ICA widely patent distally to the skull base without stenosis, dissection, or occlusion. Vertebral arteries: Both of the vertebral arteries arise from the subclavian arteries. Vertebral arteries widely patent within the neck without stenosis, dissection or occlusion. Right vertebral artery slightly dominant. Skeleton: No acute osseous finding. No discrete worrisome lytic or blastic osseous lesions. Eagle syndrome noted. Other neck: No other acute soft tissue abnormality about the neck. Scattered calcified sialoliths with multifocal stones at the right floor of mouth. Postsurgical changes noted at the left thyroid lobe. Upper chest: Visualized upper chest demonstrates no acute finding. Review of the MIP images confirms the above findings CTA HEAD FINDINGS Anterior circulation: Petrous segments patent bilaterally. Mild atheromatous plaque within the carotid siphons without hemodynamically significant  stenosis. A1 segments widely patent. Normal anterior communicating artery. Anterior cerebral arteries patent to their distal aspects without stenosis. Left M1 widely patent. Normal left MCA bifurcation. Distal left MCA branches well perfused. Right M1 widely patent. Normal right MCA bifurcation. Abrupt cut off of a proximal right M2 branch inferior division, favored to reflect a severe short-segment stenosis (series 11, image 97). Flow is seen distally, although is fairly attenuated. Superior division remains patent as does its distal branches. Posterior circulation: Vertebral arteries patent to the vertebrobasilar junction without stenosis. Posterior inferior cerebral arteries not well assessed on this examination. Basilar artery mildly diminutive but patent to its distal aspect without stenosis. Superior cerebral arteries patent bilaterally. Left PCA arises from the basilar. Fetal type origin of the right PCA. PCAs widely patent to their distal aspects without stenosis. Venous sinuses: Grossly patent allowing for timing of the contrast bolus. Anatomic variants: Fetal type right PCA. Delayed phase: Not performed. Review of the MIP images confirms the above findings CT Brain Perfusion Findings: ASPECTS: 9 CBF (<30%) Volume: 25mL Perfusion (Tmax>6.0s) volume: 35mL Mismatch Volume: 28mL Infarction Location:Negative CT perfusion for core infarct. 18 cc ischemic penumbra at the posterior right temporal lobe, posterior right MCA distribution. IMPRESSION: 1. Abrupt cut off of a proximal right M2 branch, inferior division, favored to reflect a short-segment severe high-grade stenosis. Flow is seen distally within the right MCA branches, although is attenuated and reduced. 2. Focal 18 cc ischemic penumbra at the  posterior right temporal lobe, corresponding with the right M2 severe stenosis. Small occult core infarct in this region is suspected given the subtle early changes on the noncontrast head CT portion of this exam. 3.  Mild atherosclerotic change about the aortic arch, right carotid bifurcation, and carotid siphons without hemodynamically significant stenosis. Critical Value/emergent results were called by telephone at the time of interpretation on 03/01/2019 at 1:26 am to Dr. Ritta Slot , who verbally acknowledged these results. Electronically Signed   By: Rise Mu M.D.   On: 03/01/2019 01:56   Dg Pelvis 1-2 Views  Result Date: 03/01/2019 CLINICAL DATA:  Recent fall. Unable to inverted left foot. Question stroke. EXAM: PELVIS - 1-2 VIEW COMPARISON:  None FINDINGS: Contrast fills the urinary bladder.  Bladder is unremarkable. Pelvis is intact.  Lower lumbar spine is within. Lucency is noted along the greater trochanter. This may represent an avulsion fracture. IMPRESSION: 1. Possible avulsion fracture along the greater trochanter. Recommend dedicated radiographs of the left hip further evaluation. 2. The pelvis is otherwise unremarkable. Electronically Signed   By: Marin Roberts M.D.   On: 03/01/2019 08:37   Ct Head Wo Contrast  Result Date: 02/28/2019 CLINICAL DATA:  Fall with hematoma to left side of head EXAM: CT HEAD WITHOUT CONTRAST CT CERVICAL SPINE WITHOUT CONTRAST TECHNIQUE: Multidetector CT imaging of the head and cervical spine was performed following the standard protocol without intravenous contrast. Multiplanar CT image reconstructions of the cervical spine were also generated. COMPARISON:  MRI 12/19/2018, CT brain 12/19/2018, MRI 04/17/2015 FINDINGS: CT HEAD FINDINGS Brain: No acute territorial infarction, hemorrhage or intracranial mass. Mild atrophy. Moderate small vessel ischemic changes of the white matter. 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 Stable ventricle size. Vascular: No hyperdense vessels.  Carotid vascular calcification Skull: Normal. Negative for fracture or focal lesion. Sinuses/Orbits: No acute finding.  Other: None CT CERVICAL SPINE FINDINGS Alignment: No subluxation.  Facet alignment within normal limits Skull base and vertebrae: No acute fracture. No primary bone lesion or focal pathologic process. Soft tissues and spinal canal: No prevertebral fluid or swelling. No visible canal hematoma. Disc levels: Moderate degenerative changes at C4-C5, C5-C6 and C6-C7. Posterior disc osteophyte complex at multiple levels. Upper chest: Negative.  Mild carotid vascular calcification Other: None IMPRESSION: 1. No CT evidence for acute intracranial abnormality. Atrophy and small vessel ischemic changes of the white matter. 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 Degenerative changes of the cervical spine. No acute osseous abnormality. Electronically Signed   By: Jasmine Pang M.D.   On: 02/28/2019 23:16   Ct Angio Neck W Or Wo Contrast  Result Date: 03/01/2019 CLINICAL DATA:  Initial evaluation for acute altered mental status, possible visual field cut. EXAM: CT ANGIOGRAPHY HEAD AND NECK CT PERFUSION BRAIN TECHNIQUE: Multidetector CT imaging of the head and neck was performed using the standard protocol during bolus administration of intravenous contrast. Multiplanar CT image reconstructions and MIPs were obtained to evaluate the vascular anatomy. Carotid stenosis measurements (when applicable) are obtained utilizing NASCET criteria, using the distal internal carotid diameter as the denominator. Multiphase CT imaging of the brain was performed following IV bolus contrast injection. Subsequent parametric perfusion maps were calculated using RAPID software. CONTRAST:  OMNIPAQUE IOHEXOL 350 MG/ML SOLN COMPARISON:  Prior CT from earlier the same evening. FINDINGS: CT HEAD FINDINGS Brain: Generalized age-related cerebral atrophy with moderate chronic small vessel ischemic disease. Multiple chronic lacunar infarcts  noted within the bilateral basal ganglia and right thalamus. Extremely subtle  loss of gray-white matter differentiation with evolving hypodensity seen at the posterior right temporal region (series 5, image 16), suspicious for of all vein posterior right MCA territory infarct. This correlates with area of ischemic penumbra on concomitant perfusion exam. Otherwise, gray-white matter differentiation maintained with no other acute large vessel territory infarct. No acute intracranial hemorrhage. No mass lesion, midline shift or mass effect. No hydrocephalus. No extra-axial fluid collection. Vascular: Single focal hyperdensity involving a proximal right M2 branch, favored to reflect focal atherosclerotic plaque rather than thrombus (series 8, image 20). No other hyperdense vessel. Scattered vascular calcifications noted within the carotid siphons. Skull: Scalp soft tissues and calvarium within normal limits. Sinuses/Orbits: Globes and orbital soft tissues within normal limits. Paranasal sinuses are largely clear. No mastoid effusion. Other: None. ASPECTS ([place] Stroke Program Early CT Score) - Ganglionic level infarction (caudate, lentiform nuclei, internal capsule, insula, M1-M3 cortex): 6 - Supraganglionic infarction (M4-M6 cortex): 3 Total score (0-10 with 10 being normal): 9 Review of the MIP images confirms the above findings CTA NECK FINDINGS Aortic arch: Visualized aortic arch of normal caliber with normal 3 vessel morphology. Mild scattered atheromatous plaque about the arch and origin of the great vessels without hemodynamically significant stenosis. Visualized subclavian arteries widely patent. Right carotid system: Right common carotid artery patent from its origin to the bifurcation without stenosis. Mixed plaque about the right bifurcation/proximal right ICA without hemodynamically significant stenosis. Right ICA widely patent distally to the skull base without stenosis, dissection or occlusion. Left carotid system: Left common carotid artery patent from its origin to the  bifurcation without stenosis. No significant atheromatous change or narrowing about the left bifurcation. Left ICA widely patent distally to the skull base without stenosis, dissection, or occlusion. Vertebral arteries: Both of the vertebral arteries arise from the subclavian arteries. Vertebral arteries widely patent within the neck without stenosis, dissection or occlusion. Right vertebral artery slightly dominant. Skeleton: No acute osseous finding. No discrete worrisome lytic or blastic osseous lesions. Eagle syndrome noted. Other neck: No other acute soft tissue abnormality about the neck. Scattered calcified sialoliths with multifocal stones at the right floor of mouth. Postsurgical changes noted at the left thyroid lobe. Upper chest: Visualized upper chest demonstrates no acute finding. Review of the MIP images confirms the above findings CTA HEAD FINDINGS Anterior circulation: Petrous segments patent bilaterally. Mild atheromatous plaque within the carotid siphons without hemodynamically significant stenosis. A1 segments widely patent. Normal anterior communicating artery. Anterior cerebral arteries patent to their distal aspects without stenosis. Left M1 widely patent. Normal left MCA bifurcation. Distal left MCA branches well perfused. Right M1 widely patent. Normal right MCA bifurcation. Abrupt cut off of a proximal right M2 branch inferior division, favored to reflect a severe short-segment stenosis (series 11, image 97). Flow is seen distally, although is fairly attenuated. Superior division remains patent as does its distal branches. Posterior circulation: Vertebral arteries patent to the vertebrobasilar junction without stenosis. Posterior inferior cerebral arteries not well assessed on this examination. Basilar artery mildly diminutive but patent to its distal aspect without stenosis. Superior cerebral arteries patent bilaterally. Left PCA arises from the basilar. Fetal type origin of the right PCA.  PCAs widely patent to their distal aspects without stenosis. Venous sinuses: Grossly patent allowing for timing of the contrast bolus. Anatomic variants: Fetal type right PCA. Delayed phase: Not performed. Review of the MIP images confirms the above findings CT Brain Perfusion Findings: ASPECTS: 9  CBF (<30%) Volume: 0mL Perfusion (Tmax>6.0s) volume: 18mL Mismatch Volume: 18mL Infarction Location:Negative CT perfusion for core infarct. 18 cc ischemic penumbra at the posterior right temporal lobe, posterior right MCA distribution. IMPRESSION: 1. Abrupt cut off of a proximal right M2 branch, inferior division, favored to reflect a short-segment severe high-grade stenosis. Flow is seen distally within the right MCA branches, although is attenuated and reduced. 2. Focal 18 cc ischemic penumbra at the posterior right temporal lobe, corresponding with the right M2 severe stenosis. Small occult core infarct in this region is suspected given the subtle early changes on the noncontrast head CT portion of this exam. 3. Mild atherosclerotic change about the aortic arch, right carotid bifurcation, and carotid siphons without hemodynamically significant stenosis. Critical Value/emergent results were called by telephone at the time of interpretation on 03/01/2019 at 1:26 am to Dr. Ritta Slot , who verbally acknowledged these results. Electronically Signed   By: Rise Mu M.D.   On: 03/01/2019 01:56   Ct Cervical Spine Wo Contrast  Result Date: 02/28/2019 CLINICAL DATA:  Fall with hematoma to left side of head EXAM: CT HEAD WITHOUT CONTRAST CT CERVICAL SPINE WITHOUT CONTRAST TECHNIQUE: Multidetector CT imaging of the head and cervical spine was performed following the standard protocol without intravenous contrast. Multiplanar CT image reconstructions of the cervical spine were also generated. COMPARISON:  MRI 12/19/2018, CT brain 12/19/2018, MRI 04/17/2015 FINDINGS: CT HEAD FINDINGS Brain: No acute  territorial infarction, hemorrhage or intracranial mass. Mild atrophy. Moderate small vessel ischemic changes of the white matter. 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 Stable ventricle size. Vascular: No hyperdense vessels.  Carotid vascular calcification Skull: Normal. Negative for fracture or focal lesion. Sinuses/Orbits: No acute finding. Other: None CT CERVICAL SPINE FINDINGS Alignment: No subluxation.  Facet alignment within normal limits Skull base and vertebrae: No acute fracture. No primary bone lesion or focal pathologic process. Soft tissues and spinal canal: No prevertebral fluid or swelling. No visible canal hematoma. Disc levels: Moderate degenerative changes at C4-C5, C5-C6 and C6-C7. Posterior disc osteophyte complex at multiple levels. Upper chest: Negative.  Mild carotid vascular calcification Other: None IMPRESSION: 1. No CT evidence for acute intracranial abnormality. Atrophy and small vessel ischemic changes of the white matter. 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 Degenerative changes of the cervical spine. No acute osseous abnormality. Electronically Signed   By: Jasmine Pang M.D.   On: 02/28/2019 23:16   Mr Brain Wo Contrast  Result Date: 03/01/2019 CLINICAL DATA:  Stroke. History of diabetes, hyperlipidemia, hypertension. History of multiple sclerosis. EXAM: MRI HEAD WITHOUT CONTRAST TECHNIQUE: Multiplanar, multiecho pulse sequences of the brain and surrounding structures were obtained without intravenous contrast. COMPARISON:  CTA 03/01/2019.  MRI head 12/19/2018 FINDINGS: Brain: Acute infarct in the right posterior temporoparietal lobe. Multiple small patchy areas of restricted diffusion are seen in this territory. Occlusion of right M2 branch noted on CTA. Small areas of acute or subacute infarct in the left posterior parietal lobe and left frontal lobe over the convexity. Ventricle size is  normal. Mild atrophy. Extensive changes throughout the periventricular deep white matter bilaterally. Chronic lacunar infarctions in the basal ganglia bilaterally brainstem and cerebellum intact. Several areas of chronic microhemorrhage in the brain. Negative for mass or edema. Vascular: Normal arterial flow voids Skull and upper cervical spine: Negative Sinuses/Orbits: Negative Other: None IMPRESSION: Acute infarct right MCA territory corresponding to right M2 occlusion on CTA. This involves  the posterior temporoparietal lobe. Small areas of recent infarct in the left frontal parietal lobe likely subacute nature. Possible emboli. Advanced chronic white matter changes. This is likely due to a combination of chronic microvascular ischemia and multiple sclerosis. Electronically Signed   By: Marlan Palauharles  Clark M.D.   On: 03/01/2019 08:04   Ct Cerebral Perfusion W Contrast  Result Date: 03/01/2019 CLINICAL DATA:  Initial evaluation for acute altered mental status, possible visual field cut. EXAM: CT ANGIOGRAPHY HEAD AND NECK CT PERFUSION BRAIN TECHNIQUE: Multidetector CT imaging of the head and neck was performed using the standard protocol during bolus administration of intravenous contrast. Multiplanar CT image reconstructions and MIPs were obtained to evaluate the vascular anatomy. Carotid stenosis measurements (when applicable) are obtained utilizing NASCET criteria, using the distal internal carotid diameter as the denominator. Multiphase CT imaging of the brain was performed following IV bolus contrast injection. Subsequent parametric perfusion maps were calculated using RAPID software. CONTRAST:  100mL OMNIPAQUE IOHEXOL 350 MG/ML SOLN COMPARISON:  Prior CT from earlier the same evening. FINDINGS: CT HEAD FINDINGS Brain: Generalized age-related cerebral atrophy with moderate chronic small vessel ischemic disease. Multiple chronic lacunar infarcts noted within the bilateral basal ganglia and right thalamus.  Extremely subtle loss of gray-white matter differentiation with evolving hypodensity seen at the posterior right temporal region (series 5, image 16), suspicious for of all vein posterior right MCA territory infarct. This correlates with area of ischemic penumbra on concomitant perfusion exam. Otherwise, gray-white matter differentiation maintained with no other acute large vessel territory infarct. No acute intracranial hemorrhage. No mass lesion, midline shift or mass effect. No hydrocephalus. No extra-axial fluid collection. Vascular: Single focal hyperdensity involving a proximal right M2 branch, favored to reflect focal atherosclerotic plaque rather than thrombus (series 8, image 20). No other hyperdense vessel. Scattered vascular calcifications noted within the carotid siphons. Skull: Scalp soft tissues and calvarium within normal limits. Sinuses/Orbits: Globes and orbital soft tissues within normal limits. Paranasal sinuses are largely clear. No mastoid effusion. Other: None. ASPECTS ([place] Stroke Program Early CT Score) - Ganglionic level infarction (caudate, lentiform nuclei, internal capsule, insula, M1-M3 cortex): 6 - Supraganglionic infarction (M4-M6 cortex): 3 Total score (0-10 with 10 being normal): 9 Review of the MIP images confirms the above findings CTA NECK FINDINGS Aortic arch: Visualized aortic arch of normal caliber with normal 3 vessel morphology. Mild scattered atheromatous plaque about the arch and origin of the great vessels without hemodynamically significant stenosis. Visualized subclavian arteries widely patent. Right carotid system: Right common carotid artery patent from its origin to the bifurcation without stenosis. Mixed plaque about the right bifurcation/proximal right ICA without hemodynamically significant stenosis. Right ICA widely patent distally to the skull base without stenosis, dissection or occlusion. Left carotid system: Left common carotid artery patent from its origin  to the bifurcation without stenosis. No significant atheromatous change or narrowing about the left bifurcation. Left ICA widely patent distally to the skull base without stenosis, dissection, or occlusion. Vertebral arteries: Both of the vertebral arteries arise from the subclavian arteries. Vertebral arteries widely patent within the neck without stenosis, dissection or occlusion. Right vertebral artery slightly dominant. Skeleton: No acute osseous finding. No discrete worrisome lytic or blastic osseous lesions. Eagle syndrome noted. Other neck: No other acute soft tissue abnormality about the neck. Scattered calcified sialoliths with multifocal stones at the right floor of mouth. Postsurgical changes noted at the left thyroid lobe. Upper chest: Visualized upper chest demonstrates no acute finding. Review of the MIP images confirms  the above findings CTA HEAD FINDINGS Anterior circulation: Petrous segments patent bilaterally. Mild atheromatous plaque within the carotid siphons without hemodynamically significant stenosis. A1 segments widely patent. Normal anterior communicating artery. Anterior cerebral arteries patent to their distal aspects without stenosis. Left M1 widely patent. Normal left MCA bifurcation. Distal left MCA branches well perfused. Right M1 widely patent. Normal right MCA bifurcation. Abrupt cut off of a proximal right M2 branch inferior division, favored to reflect a severe short-segment stenosis (series 11, image 97). Flow is seen distally, although is fairly attenuated. Superior division remains patent as does its distal branches. Posterior circulation: Vertebral arteries patent to the vertebrobasilar junction without stenosis. Posterior inferior cerebral arteries not well assessed on this examination. Basilar artery mildly diminutive but patent to its distal aspect without stenosis. Superior cerebral arteries patent bilaterally. Left PCA arises from the basilar. Fetal type origin of the  right PCA. PCAs widely patent to their distal aspects without stenosis. Venous sinuses: Grossly patent allowing for timing of the contrast bolus. Anatomic variants: Fetal type right PCA. Delayed phase: Not performed. Review of the MIP images confirms the above findings CT Brain Perfusion Findings: ASPECTS: 9 CBF (<30%) Volume: 0mL Perfusion (Tmax>6.0s) volume: 18mL Mismatch Volume: 18mL Infarction Location:Negative CT perfusion for core infarct. 18 cc ischemic penumbra at the posterior right temporal lobe, posterior right MCA distribution. IMPRESSION: 1. Abrupt cut off of a proximal right M2 branch, inferior division, favored to reflect a short-segment severe high-grade stenosis. Flow is seen distally within the right MCA branches, although is attenuated and reduced. 2. Focal 18 cc ischemic penumbra at the posterior right temporal lobe, corresponding with the right M2 severe stenosis. Small occult core infarct in this region is suspected given the subtle early changes on the noncontrast head CT portion of this exam. 3. Mild atherosclerotic change about the aortic arch, right carotid bifurcation, and carotid siphons without hemodynamically significant stenosis. Critical Value/emergent results were called by telephone at the time of interpretation on 03/01/2019 at 1:26 am to Dr. Ritta Slot , who verbally acknowledged these results. Electronically Signed   By: Rise Mu M.D.   On: 03/01/2019 01:56   Dg Chest Portable 1 View  Result Date: 02/28/2019 CLINICAL DATA:  Altered mental status EXAM: PORTABLE CHEST 1 VIEW COMPARISON:  12/19/2018 FINDINGS: No acute opacity or pleural effusion. Mild scarring at the left base. Normal heart size. No pneumothorax. IMPRESSION: No active disease. Electronically Signed   By: Jasmine Pang M.D.   On: 02/28/2019 22:52   Transthoracic Echocardiogram  00/00/2020 Pending  EKG -sinus rhythm.  Nonspecific T wave abnormalities.  PHYSICAL EXAM Blood pressure (!)  161/78, pulse 83, temperature 97.8 F (36.6 C), temperature source Oral, resp. rate 14, height 5' (1.524 m), weight 74.4 kg, SpO2 93 %.  GEN: Appears well-developed and well-nourished elderly Caucasian lady.  In mild  distress Psych: Flat affect; lacks insight to situation Eyes: No scleral injection HENT: No OP obstrucion Head: Normocephalic.  Cardiovascular: Normal rate and regular rhythm.  Respiratory: Effort normal, non-labored breathing GI: Soft.  No distension. There is no tenderness.  Skin: WDI  Neuro: Mental Status: Patient is awake, alert, oriented to person, place, can't give me month/year. Language is sparse and she cannot tell me why she is in hospital.  Decreased reaction time.  Flat affect.  Has trouble naming objects, did not repeat phrases for me despite some effort to do so. She has a significant left neglect syndrome. Followed simple command, but attention is poor and cannot  do multistep commands. Cranial Nerves: II: Left hemianopia. Pupils are equal, round, and reactive to light.   III,IV, VI: She has a right gaze preference, I could not get her to track across midline to the left V: Facial sensation is symmetric to LT VII: Facial movement with very mild left facial weakness VIII: hearing is intact to voice X: Uvula elevates symmetrically XI: Shoulder shrug is symmetric. XII: tongue is midline Motor: She has mild left hemiparesis with 4-/5 strength of the left arm and leg, full strength in the right Sensory: Sensation is diminished on the left, though she is not aware of this diminishment.  She extinguishes to double simultaneous stimulation. Cerebellar: unable  HOME MEDICATIONS:  Medications Prior to Admission  Medication Sig Dispense Refill  . atorvastatin (LIPITOR) 20 MG tablet Take 20 mg by mouth daily.    Marland Kitchen estradiol (ESTRACE) 0.5 MG tablet Take 0.5 mg by mouth daily.    . hydrALAZINE (APRESOLINE) 50 MG tablet Take 1 tablet (50 mg total) by mouth every 8  (eight) hours for 30 days. 90 tablet 0  . JANUVIA 50 MG tablet Take 50 mg by mouth daily.    Marland Kitchen lisinopril (PRINIVIL,ZESTRIL) 20 MG tablet Take 1 tablet (20 mg total) by mouth daily for 30 days. 30 tablet 0  . metFORMIN (GLUCOPHAGE-XR) 500 MG 24 hr tablet Take 1,000 mg by mouth 2 (two) times daily.    . Multiple Vitamin (MULTIVITAMIN) capsule Take 1 capsule by mouth daily.    Marland Kitchen omeprazole (PRILOSEC OTC) 20 MG tablet Take 20 mg by mouth daily.    Marland Kitchen UNABLE TO FIND Incromega [id number]  (fish oils)        HOSPITAL MEDICATIONS:  . aspirin  300 mg Rectal Daily   Or  . aspirin  325 mg Oral Daily  . atorvastatin  80 mg Oral q1800  . [START ON 03/02/2019] clopidogrel  75 mg Oral Daily  . enoxaparin (LOVENOX) injection  40 mg Subcutaneous Q24H  . insulin aspart  0-9 Units Subcutaneous TID WC    ALLERGIES No Known Allergies  PAST MEDICAL HISTORY Past Medical History:  Diagnosis Date  . Chronic kidney disease (CKD), stage II (mild)   . Chronic pain   . DM type 2 (diabetes mellitus, type 2) (HCC)   . Fatty liver    per CT  . GERD (gastroesophageal reflux disease)   . History of hepatitis as a child    age 40 (jaundice)  . HTN (hypertension)   . Hyperlipidemia   . MS (multiple sclerosis) (HCC)   . Obesity   . Osteopenia   . Primary hyperparathyroidism (HCC)    s/p surgery removal of parathyroid (Dr. Sharl Ma, Dr. Gerrit Friends)  . Vitamin D deficiency     SURGICAL HISTORY Past Surgical History:  Procedure Laterality Date  . ANTERIOR CRUCIATE LIGAMENT REPAIR Left   . VESICOVAGINAL FISTULA CLOSURE W/ TAH      FAMILY HISTORY Family History  Problem Relation Age of Onset  . Mental illness Mother   . COPD Father   . Lung cancer Father     SOCIAL HISTORY  reports that she has never smoked. She has never used smokeless tobacco. She reports that she does not drink alcohol or use drugs.  ASSESSMENT/PLAN Ms. KEONDRA HAYDU is a 73 y.o. female with history of diabetes and hyperlipidemia  presenting with fall followed by altered mental status and left hemiparesis. She did not receive IV t-PA due to unknown last known normal  Cardioembolic  looking RMCA stroke, with inferior division near occlusion no source yet found.   Resultant  Aphasia, left neglect, left VFC, left weakness  CT head no acute findings on CT  MRI head -Scattered distal right posterior temporoparietal MCA infarcts, there are also some small infarcts seen in left posterior parietal/frontal that are less intense on DWI, likely subacute in nature.  CTA H&N - R ICA with mixed plaque, no stenosis. LICA ok, R M2 cut off in inferior division.  2D Echo -normal ejection fraction.  No wall motion abnormalities.    Sars Corona Virus 2 neg  LDL - 37  HgbA1c - 5.6  UDS - neg  VTE prophylaxis -Lovenox  Diet per SLP  none prior to admission, now on ASA 325 + Plavix for 3 months, given degree of intracranial stenosis then monotherapy with Plavix only  Patient counseled to be compliant with her antithrombotic medications when able  Ongoing aggressive stroke risk factor management  Therapy recommendations:  pending  Disposition:  Pending  Hypertension  Stable . Permissive hypertension (OK if < 220/120) but gradually normalize in 5-7 days . Long-term BP goal normotensive  Hyperlipidemia  Lipid lowering medication PTA:  lipitor 20  LDL 37, goal < 70  Current lipid lowering medication:decrease lipitor to home dose of  as her LDL is far under goal.  Continue statin at discharge  No hx of diabetes  Other Stroke Risk Factors  Advanced age   Hospital day # 0  Desiree Metzger-Cihelka, ARNP-C, ANVP-BC Pager: [phone]  I have personally obtained history,examined this patient, reviewed notes, independently viewed imaging studies, participated in medical decision making and plan of care.ROS completed by me personally and pertinent positives fully documented  I have made any additions or  clarifications directly to the above note. Agree with note above.  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  She will need prolonged cardiac monitoring for paroxysmal A. fib and TEE later is available.  Recommend aspirin and Plavix for 3 weeks followed by aspirin alone.  Discussed with Dr. Gwenlyn Perking. Greater than 50% time during this 35-minute visit was spent on counseling and coordination of care about her embolic stroke and answering questions  Delia Heady, MD Medical Director [facility] Pager: [phone] 03/01/2019 6:07 PM  To contact Stroke Continuity provider, please refer to [email or web address]. After hours, contact General Neurology

## 2019-03-01 NOTE — Evaluation (Signed)
Occupational Therapy Evaluation Patient Details Name: Janice NimsCarol T Little MRN: [id number] DOB: 11/05/1945 Today's Date: 03/01/2019    History of Present Illness Pt is a 73 y/o female who presents from home s/p fall and head injury. Pt unable to determine how long she was down before family found her. MRI revealed an acute infarct right MCA territory as well as small areas of recent infarct in the left frontal parietal lobe likely subacute in nature.   Clinical Impression   Pt PTA: Pt living alone and reports independence. Pt reports that her daughter is more available to assist as she now works from home. Pt currently, limited by significant deficits in L visual field with no scanning and poor tracking with L eye; LUE weakness and incoordination. Pt performing ADL tasks with maxA as pt fatigues so easily and unable to attend to tasks without multimodal cues. Pt performing sitting EOB x10 mins with maxA+2 [facility] but unable as LLE incoordination and weakness. Pt would benefit from continued OT skilled services for vision training, LUE weakness and assist with ADL/mobility. OT following acutely.      Follow Up Recommendations  SNF;Supervision/Assistance - 24 hour    Equipment Recommendations  Other (comment)(to bve determined at next venue)    Recommendations for Other Services       Precautions / Restrictions Precautions Precautions: Fall;Other (comment) Precaution Comments: L inattention Restrictions Weight Bearing Restrictions: No      Mobility Bed Mobility Overal bed mobility: Needs Assistance Bed Mobility: Rolling;Sidelying to Sit Rolling: Mod assist;Max assist Sidelying to sit: Max assist;+2 for physical assistance;HOB elevated       General bed mobility comments: HOB slightly elevated. Pt required +2 assist for transition to EOB. Max assist to get LE's off EOB and +2 for trunk elevation to full sitting position.   Transfers Overall transfer level:  Needs assistance Equipment used: Rolling walker (2 wheeled);2 person hand held assist Transfers: Sit to/from Stand Sit to Stand: Mod assist;Max assist;+2 physical assistance         General transfer comment: Initially, pt stood to RW and required +2 mod assist to power up to full stand. Pt stood ~2 minutes, attempted marching in place (raising heel sonly), and took 1 side step to the L. Pt then reported she had to use the bathroom, and BSC was brought over. Pt fatigued and was [facility] even with +2 max assist. Pt was returned to supine for bed pan use.     Balance Overall balance assessment: Needs assistance Sitting-balance support: Feet supported;Bilateral upper extremity supported Sitting balance-Leahy Scale: Poor Sitting balance - Comments: L lateral lean with difficulty adjusting hips for equal weight R and L Postural control: Left lateral lean Standing balance support: Bilateral upper extremity supported Standing balance-Leahy Scale: Zero Standing balance comment: Up to +2 max assist for standing without a RW.                            ADL either performed or assessed with clinical judgement   ADL Overall ADL's : Needs assistance/impaired Eating/Feeding: Maximal assistance;Sitting   Grooming: Maximal assistance;Sitting   Upper Body Bathing: Maximal assistance;Bed level   Lower Body Bathing: Maximal assistance;Total assistance;Sitting/lateral leans;Sit to/from stand   Upper Body Dressing : Maximal assistance;Standing;Sitting   Lower Body Dressing: Maximal assistance;Total assistance;Sitting/lateral leans;Sit to/from stand   Toilet Transfer: Total assistance(attempting x2 times)  Functional mobility during ADLs: Maximal assistance;+2 for physical assistance;+2 for safety/equipment(maxA+2 for sit to stand, no transfer at this time) General ADL Comments: MaxA for UB ADL as RUE working well, but pt remains lethargic and confused  unable to use RUE properly without tactile cues.     Vision Baseline Vision/History: No visual deficits Vision Assessment?: Vision impaired- to be further tested in functional context;Yes Eye Alignment: Impaired (comment) Ocular Range of Motion: Restricted on the left;Restricted looking up Tracking/Visual Pursuits: Decreased smoothness of horizontal tracking;Decreased smoothness of vertical tracking;Impaired - to be further tested in functional context Additional Comments: requires more visual therapy and assessments     Perception     Praxis      Pertinent Vitals/Pain Pain Assessment: 0-10 Pain Score: 0-No pain Faces Pain Scale: No hurt     Hand Dominance Right   Extremity/Trunk Assessment Upper Extremity Assessment Upper Extremity Assessment: Generalized weakness;LUE deficits/detail;RUE deficits/detail RUE Deficits / Details: Pt having difficulty focusing on RUE for use, decreased coordination RUE Coordination: decreased fine motor;decreased gross motor LUE Deficits / Details: 2/5 MM grade in LUE LUE Coordination: decreased fine motor;decreased gross motor   Lower Extremity Assessment Lower Extremity Assessment: Defer to PT evaluation;LLE deficits/detail RLE Deficits / Details: 4+/5 with full effort in quads, hamstrings, hip flexors. 4-/5 in dorsiflexion RLE Sensation: WNL LLE Deficits / Details: weakness and incoordination LLE Sensation: decreased light touch LLE Coordination: decreased gross motor;decreased fine motor   Cervical / Trunk Assessment Cervical / Trunk Assessment: Other exceptions Cervical / Trunk Exceptions: Forward head posture with rounded shoulders   Communication Communication Communication: No difficulties   Cognition Arousal/Alertness: Lethargic;Suspect due to medications Behavior During Therapy: Flat affect Overall Cognitive Status: Impaired/Different from baseline Area of Impairment: Attention;Following  commands;Safety/judgement;Awareness;Problem solving;Orientation                 Orientation Level: Disoriented to;Time Current Attention Level: Focused   Following Commands: Follows one step commands inconsistently;Follows one step commands with increased time Safety/Judgement: Decreased awareness of safety;Decreased awareness of deficits Awareness: Intellectual Problem Solving: Slow processing;Decreased initiation;Difficulty sequencing;Requires verbal cues;Requires tactile cues General Comments: Pt lethargic. Oriented x3. L-side inattention   General Comments  Pt A/o x3 but unaware of date. Pt fatigues quickly and unable to situpright without mutlimodal cues. Pt attempting figure 4 technique for sock donning w/ RLE and requiring modA to pull sock up and LLE totalA.    Exercises     Shoulder Instructions      Home Living Family/patient expects to be discharged to:: Private residence Living Arrangements: Alone Available Help at Discharge: Family;Available PRN/intermittently;Neighbor Type of Home: Other(Comment)(condo) Home Access: Stairs to enter [employer] of Steps: 3 Entrance Stairs-Rails: Right Home Layout: One level     Bathroom Shower/Tub: Chief Strategy Officer: Standard Bathroom Accessibility: Yes How Accessible: Accessible via walker Home Equipment: Cane - single point;Walker - 2 wheels;Tub bench          Prior Functioning/Environment Level of Independence: Independent with assistive device(s)        Comments: use of SPC, was still driving; reports sometimes a friend was picking up groceries for her        OT Problem List: Decreased strength;Decreased activity tolerance;Impaired balance (sitting and/or standing);Decreased safety awareness;Decreased coordination;Pain      OT Treatment/Interventions: Self-care/ADL training;Therapeutic exercise;Neuromuscular education;Energy conservation;DME and/or AE instruction;Therapeutic  activities;Visual/perceptual remediation/compensation;Patient/family education;Cognitive remediation/compensation    OT Goals(Current goals can be found in the care plan section) Acute Rehab OT Goals Patient Stated Goal:  None stated OT Goal Formulation: With patient Time For Goal Achievement: 03/17/19 Potential to Achieve Goals: Good ADL Goals Pt Will Perform Eating: with set-up;sitting Pt Will Perform Grooming: with set-up;standing Pt Will Perform Upper Body Dressing: with modified independence;sitting Pt Will Perform Lower Body Dressing: with set-up;sitting/lateral leans;sit to/from stand Pt Will Transfer to Toilet: with supervision;bedside commode Pt Will Perform Toileting - Clothing Manipulation and hygiene: with supervision;sitting/lateral leans Additional ADL Goal #1: Pt will perform ADL tasks x5 mins sitting EOB with fair balance and 0 cues for sitting upright with no R lateral lean  OT Frequency: Min 3X/week   Barriers to D/C:            Co-evaluation PT/OT/SLP Co-Evaluation/Treatment: Yes Reason for Co-Treatment: For patient/therapist safety PT goals addressed during session: Mobility/safety with mobility;Balance;Proper use of DME OT goals addressed during session: ADL's and self-care      AM-PAC OT "6 Clicks" Daily Activity     Outcome Measure Help from another person eating meals?: A Lot Help from another person taking care of personal grooming?: A Lot Help from another person toileting, which includes using toliet, bedpan, or urinal?: A Lot Help from another person bathing (including washing, rinsing, drying)?: A Lot Help from another person to put on and taking off regular upper body clothing?: A Lot Help from another person to put on and taking off regular lower body clothing?: A Lot 6 Click Score: 12   End of Session Equipment Utilized During Treatment: Gait belt Nurse Communication: Mobility status;Other (comment)(pt was on bedpan)  Activity Tolerance:  Patient limited by fatigue;Patient limited by lethargy;Treatment limited secondary to medical complications (Comment) Patient left: in bed;with call bell/phone within reach;with bed alarm set  OT Visit Diagnosis: Unsteadiness on feet (R26.81);Muscle weakness (generalized) (M62.81)                Time: 5110-2111 OT Time Calculation (min): 32 min Charges:  OT General Charges $OT Visit: 1 Visit OT Evaluation $OT Eval Moderate Complexity: 1 Mod  Cristi Loron) Glendell Docker OTR/L Acute Rehabilitation Services Pager: [phone] Office: [phone]   Lonzo Cloud 03/01/2019, 12:38 PM

## 2019-03-01 NOTE — ED Notes (Signed)
Transported to CT scan

## 2019-03-01 NOTE — Significant Event (Signed)
Rapid Response Event Note  Overview:Called d/t NIH2-12 Time Called: 2105 Arrival Time: 2110 Event Type: Neurologic  Initial Focused Assessment: Pt laying in bed with eyes opened, following commands, alert and oriented, skin warm and dry, NIH-11. T-97.8, BP-160/68, HR-87, RR-24, SpO2-98% on RA, pupils 3/brisk.  Interventions: CBG-143 NIH-11 1L NS bolus x 1 STAT CT head-unchanged  Plan of Care (if not transferred): Neuro aware of CT results, RN to do NIH post bolus and continue to monitor pt. Call RRT if further assistance needed. Event Summary: Name of Physician Notified: Rana Snare, NP(PTA RRT and at 2125 ) at    Name of Consulting Physician Notified: Kirkpatrick([id number]) at    Outcome: Stayed in room and stabalized     [place], [place]

## 2019-03-01 NOTE — H&P (Signed)
History and Physical    Janice Little SWN:[id number] DOB: Sep 22, 1946 DOA: 02/28/2019  PCP: Juluis Rainier, MD  Patient coming from: Home.  Chief Complaint: Confusion.  HPI: Janice Little is a 73 y.o. female with history of hypertension, diabetes mellitus type 2, hyperlipidemia and multiple sclerosis was last seen by family on Monday 4 days ago was not able to be reached and so patient family called for wellness check and patient was found to be on the floor in her room.  Patient appeared mildly confused but was following commands and was brought to the ER.  Patient states she has fallen on Monday 4 days ago and not sure how she fell.  States she has not taken her medications since her fall.  ED Course: In the ER on exam patient not able to move her eyes towards the left.  And also has neglect on the left side and has mild weakness of the left upper and lower extremity.  CT head was unremarkable.  On-call neurologist Dr. Amada Jupiter was consulted and patient admitted for acute CVA.  Blood pressure is markedly elevated given the acute CVA will allow for permissive hypertension.  Patient denies any chest pain shortness of breath or any pain around the extremities.  EKG shows normal sinus rhythm.  Labs show creatinine of 1.2 hemoglobin 14.3 WBC 13.2 platelets 288.  CT of the C-spine was unremarkable chest x-ray was unremarkable.  Review of Systems: As per HPI, rest all negative.   Past Medical History:  Diagnosis Date  . Chronic kidney disease (CKD), stage II (mild)   . Chronic pain   . DM type 2 (diabetes mellitus, type 2) (HCC)   . Fatty liver    per CT  . GERD (gastroesophageal reflux disease)   . History of hepatitis as a child    age 23 (jaundice)  . HTN (hypertension)   . Hyperlipidemia   . MS (multiple sclerosis) (HCC)   . Obesity   . Osteopenia   . Primary hyperparathyroidism (HCC)    s/p surgery removal of parathyroid (Dr. Sharl Ma, Dr. Gerrit Friends)  . Vitamin D deficiency      Past Surgical History:  Procedure Laterality Date  . ANTERIOR CRUCIATE LIGAMENT REPAIR Left   . VESICOVAGINAL FISTULA CLOSURE W/ TAH       reports that she has never smoked. She has never used smokeless tobacco. She reports that she does not drink alcohol or use drugs.  No Known Allergies  Family History  Problem Relation Age of Onset  . Mental illness Mother   . COPD Father   . Lung cancer Father     Prior to Admission medications   Medication Sig Start Date End Date Taking? Authorizing Provider  atorvastatin (LIPITOR) 20 MG tablet Take 20 mg by mouth daily.    [provider]  estradiol (ESTRACE) 0.5 MG tablet Take 0.5 mg by mouth daily.    [provider]  hydrALAZINE (APRESOLINE) 50 MG tablet Take 1 tablet (50 mg total) by mouth every 8 (eight) hours for 30 days. 12/26/18 01/25/19  Arrien, York Ram, MD  JANUVIA 50 MG tablet Take 50 mg by mouth daily. 10/10/18   [provider]  lisinopril (PRINIVIL,ZESTRIL) 20 MG tablet Take 1 tablet (20 mg total) by mouth daily for 30 days. 12/27/18 01/26/19  Arrien, York Ram, MD  metFORMIN (GLUCOPHAGE-XR) 500 MG 24 hr tablet Take 1,000 mg by mouth 2 (two) times daily. 10/10/18   [provider]  Multiple  Vitamin (MULTIVITAMIN) capsule Take 1 capsule by mouth daily.    [provider]  omeprazole (PRILOSEC OTC) 20 MG tablet Take 20 mg by mouth daily.    [provider]  UNABLE TO Carman Ching [phone]  (fish oils)    [provider]    Physical Exam: Vitals:   03/01/19 0000 03/01/19 0015 03/01/19 0030 03/01/19 0045  BP: (!) 168/87 (!) 176/90 (!) 190/91 (!) 202/99  Pulse: 98 (!) 101 98 100  Resp: Temp:      TempSrc:      SpO2: 99% 97% 96% 99%      Constitutional: Moderately built and nourished. Vitals:   03/01/19 0000 03/01/19 0015 03/01/19 0030 03/01/19 0045  BP: (!) 168/87 (!) 176/90 (!) 190/91 (!) 202/99  Pulse: 98 (!) 101 98 100  Resp: Temp:      TempSrc:      SpO2: 99% 97% 96% 99%   Eyes: Anicteric no pallor. ENMT: No discharge from the ears eyes nose and mouth. Neck: No mass felt.  No JVD appreciated no neck rigidity. Respiratory: No rhonchi or crepitations. Cardiovascular: S1-S2 heard. Abdomen: Soft nontender bowel sounds present. Musculoskeletal: No edema.  No joint effusion. Skin: Hematoma on the left temple area. Neurologic: Alert awake oriented to her name and place moves all extremities but weak on the left upper and lower extremity 4 x 5.  Unable to move her eyes towards the left and has left-sided neglect.  No facial asymmetry tongue is midline pupils are reacting to light. Psychiatric: Oriented to her name and place.   Labs on Admission: I have personally reviewed following labs and imaging studies  CBC: Recent Labs  Lab 02/28/19 2210  WBC 13.2*  NEUTROABS 10.7*  HGB 14.3  HCT 43.4  MCV 85.8  PLT 288   Basic Metabolic Panel: Recent Labs  Lab 02/28/19 2210  NA 141  K 3.9  CL 106  CO2 18*  GLUCOSE 125*  BUN 22  CREATININE 1.20*  CALCIUM 9.3   GFR: CrCl cannot be calculated (Unknown ideal weight.). Liver Function Tests: Recent Labs  Lab 02/28/19 2210  AST 34  ALT 20  ALKPHOS 96  BILITOT 1.1  PROT 6.1*  ALBUMIN 3.7   No results for input(s): LIPASE, AMYLASE in the last 168 hours. No results for input(s): AMMONIA in the last 168 hours. Coagulation Profile: No results for input(s): INR, PROTIME in the last 168 hours. Cardiac Enzymes: Recent Labs  Lab 02/28/19 2210  CKTOTAL 364*  TROPONINI <0.03   BNP (last 3 results) No results for input(s): PROBNP in the last 8760 hours. HbA1C: No results for input(s): HGBA1C in the last 72 hours. CBG: No results for input(s): GLUCAP in the last 168 hours. Lipid Profile: No results for input(s): CHOL, HDL, LDLCALC, TRIG, CHOLHDL, LDLDIRECT in the last 72 hours. Thyroid Function Tests: No results for input(s): TSH, T4TOTAL,  FREET4, T3FREE, THYROIDAB in the last 72 hours. Anemia Panel: No results for input(s): VITAMINB12, FOLATE, FERRITIN, TIBC, IRON, RETICCTPCT in the last 72 hours. Urine analysis:    Component Value Date/Time   COLORURINE YELLOW 12/22/2018 1215   APPEARANCEUR HAZY (A) 12/22/2018 1215   LABSPEC 1.005 12/22/2018 1215   PHURINE 6.0 12/22/2018 1215   GLUCOSEU NEGATIVE 12/22/2018 1215   HGBUR NEGATIVE 12/22/2018 1215   BILIRUBINUR NEGATIVE 12/22/2018 1215   KETONESUR NEGATIVE 12/22/2018 1215   PROTEINUR NEGATIVE 12/22/2018 1215   UROBILINOGEN 0.2 11/19/2009  1055   NITRITE NEGATIVE 12/22/2018 1215   LEUKOCYTESUR NEGATIVE 12/22/2018 1215   Sepsis Labs: (procalcitonin:4,lacticidven:4) )No results found for this or any previous visit (from the past 240 hour(s)).   Radiological Exams on Admission: Ct Head Wo Contrast  Result Date: 02/28/2019 CLINICAL DATA:  Fall with hematoma to left side of head EXAM: CT HEAD WITHOUT CONTRAST CT CERVICAL SPINE WITHOUT CONTRAST TECHNIQUE: Multidetector CT imaging of the head and cervical spine was performed following the standard protocol without intravenous contrast. Multiplanar CT image reconstructions of the cervical spine were also generated. COMPARISON:  MRI 12/19/2018, CT brain 12/19/2018, MRI 04/17/2015 FINDINGS: CT HEAD FINDINGS Brain: No acute territorial infarction, hemorrhage or intracranial mass. Mild atrophy. Moderate small vessel ischemic changes of the white matter. 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 Stable ventricle size. Vascular: No hyperdense vessels.  Carotid vascular calcification Skull: Normal. Negative for fracture or focal lesion. Sinuses/Orbits: No acute finding. Other: None CT CERVICAL SPINE FINDINGS Alignment: No subluxation.  Facet alignment within normal limits Skull base and vertebrae: No acute fracture. No primary bone lesion or focal pathologic process. Soft tissues  and spinal canal: No prevertebral fluid or swelling. No visible canal hematoma. Disc levels: Moderate degenerative changes at C4-C5, C5-C6 and C6-C7. Posterior disc osteophyte complex at multiple levels. Upper chest: Negative.  Mild carotid vascular calcification Other: None IMPRESSION: 1. No CT evidence for acute intracranial abnormality. Atrophy and small vessel ischemic changes of the white matter. 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 Degenerative changes of the cervical spine. No acute osseous abnormality. Electronically Signed   By: Jasmine Pang M.D.   On: 02/28/2019 23:16   Ct Cervical Spine Wo Contrast  Result Date: 02/28/2019 CLINICAL DATA:  Fall with hematoma to left side of head EXAM: CT HEAD WITHOUT CONTRAST CT CERVICAL SPINE WITHOUT CONTRAST TECHNIQUE: Multidetector CT imaging of the head and cervical spine was performed following the standard protocol without intravenous contrast. Multiplanar CT image reconstructions of the cervical spine were also generated. COMPARISON:  MRI 12/19/2018, CT brain 12/19/2018, MRI 04/17/2015 FINDINGS: CT HEAD FINDINGS Brain: No acute territorial infarction, hemorrhage or intracranial mass. Mild atrophy. Moderate small vessel ischemic changes of the white matter. 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 Stable ventricle size. Vascular: No hyperdense vessels.  Carotid vascular calcification Skull: Normal. Negative for fracture or focal lesion. Sinuses/Orbits: No acute finding. Other: None CT CERVICAL SPINE FINDINGS Alignment: No subluxation.  Facet alignment within normal limits Skull base and vertebrae: No acute fracture. No primary bone lesion or focal pathologic process. Soft tissues and spinal canal: No prevertebral fluid or swelling. No visible canal hematoma. Disc levels: Moderate degenerative changes at C4-C5, C5-C6 and C6-C7. Posterior disc osteophyte complex at multiple  levels. Upper chest: Negative.  Mild carotid vascular calcification Other: None IMPRESSION: 1. No CT evidence for acute intracranial abnormality. Atrophy and small vessel ischemic changes of the white matter. 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 Degenerative changes of the cervical spine. No acute osseous abnormality. Electronically Signed   By: Jasmine Pang M.D.   On: 02/28/2019 23:16   Dg Chest Portable 1 View  Result Date: 02/28/2019 CLINICAL DATA:  Altered mental status EXAM: PORTABLE CHEST 1 VIEW COMPARISON:  12/19/2018 FINDINGS: No acute opacity or pleural effusion. Mild scarring at the left base. Normal heart size. No pneumothorax. IMPRESSION: No active disease.  Electronically Signed   By: Jasmine Pang M.D.   On: 02/28/2019 22:52    EKG: Independently reviewed.  Normal sinus rhythm.  Assessment/Plan Principal Problem:   Acute CVA (cerebrovascular accident) (HCC) Active Problems:   History of optic neuritis   Essential tremor   History of myelitis   Hypertensive urgency    1. Acute CVA -discussed with Dr. Amada Jupiter on-call neurologist.  Patient symptoms are concerning for acute CVA.  CT angiogram and CT perfusion studies of the head and neck has been ordered.  MRI brain 2D echo.  Check hemoglobin A1c lipid panel.  Patient has been placed on aspirin Plavix statins get physical therapy consult.  IV fluids.  Allow for permissive hypertension. 2. Hypertensive urgency -discussed with neurology at this time we will allow for permissive hypertension and give only PRN IV hydralazine for systolic blood pressure more than 220 or diastolic more than 120. 3. Diabetes mellitus type 2 we will keep patient on sliding scale coverage. 4. History of hyperlipidemia on statins.  Note that patient's CK levels are mildly elevated.  Patient has been placed on full dose statin closely follow CK levels. 5. Admitted in March of this year 2 months ago for hypertensive  encephalopathy.   DVT prophylaxis: Lovenox. Code Status: Full code. Family Communication: Try to reach patient's son Thayer Ohm but unable to reach. Disposition Plan: To be determined. Consults called: Neurologist. Admission status: Inpatient.   Eduard Clos MD [facility] Pager [phone].  If 7PM-7AM, please contact night-coverage [email or web address] Password [email or web address]  03/01/2019, 1:34 AM

## 2019-03-01 NOTE — Progress Notes (Signed)
Performed NIH assessment at 2032 and noted score to be 12.  Last NIH score at 1039 was 2.  Paged [facility] provider C. Bodenheimer and called [facility].  [facility] RN scored patient at 62.  Neurology consulted, who ordered a head CT.  No additional orders given.  Will continue to monitor Q4H neuro checks and mNIH.

## 2019-03-01 NOTE — ED Notes (Signed)
ED TO INPATIENT HANDOFF REPORT  ED Nurse Name and Phone #:  Molly Maduro RN [phone]  S Name/Age/Gender Janice Little 73 y.o. female Room/Bed: TRACC/TRACC  Code Status   Code Status: Full Code  Home/SNF/Other Home {Patient oriented X4 Is this baseline? Yes  Triage Complete: Triage complete  Chief Complaint head injury  Triage Note Pt comes via [facility], family called for welfare check, LSN on Monday, pt had a fall, unknown when, hematoma to L side of head, R sided gaze, generalized weakness. AxO x 2 to self and time. Pt also has hoarding situation at home and may need social worker consult.    Allergies No Known Allergies  Level of Care/Admitting Diagnosis ED Disposition    ED Disposition Condition Comment   Admit  Hospital Area: [facility] [[id number]]  Level of Care: Progressive [102]  Covid Evaluation: Screening Protocol (No Symptoms)  Diagnosis: Acute CVA (cerebrovascular accident) [facility]) [[id number]]  Admitting Physician: Eduard Clos [id number]  Attending Physician: Eduard Clos [phone]  Estimated length of stay: past midnight tomorrow  Certification:: I certify this patient will need inpatient services for at least 2 midnights  PT Class (Do Not Modify): Inpatient [101]  PT Acc Code (Do Not Modify): Private [1]       B Medical/Surgery History Past Medical History:  Diagnosis Date  . Chronic kidney disease (CKD), stage II (mild)   . Chronic pain   . DM type 2 (diabetes mellitus, type 2) (HCC)   . Fatty liver    per CT  . GERD (gastroesophageal reflux disease)   . History of hepatitis as a child    age 54 (jaundice)  . HTN (hypertension)   . Hyperlipidemia   . MS (multiple sclerosis) (HCC)   . Obesity   . Osteopenia   . Primary hyperparathyroidism (HCC)    s/p surgery removal of parathyroid (Dr. Sharl Ma, Dr. Gerrit Friends)  . Vitamin D deficiency    Past Surgical History:  Procedure Laterality Date  . ANTERIOR CRUCIATE LIGAMENT REPAIR  Left   . VESICOVAGINAL FISTULA CLOSURE W/ TAH       A IV Location/Drains/Wounds Patient Lines/Drains/Airways Status   Active Line/Drains/Airways    Name:   Placement date:   Placement time:   Site:   Days:   Peripheral IV 02/28/19 Left Antecubital   02/28/19    2151    Antecubital   1   External Urinary Catheter   02/28/19    2258    -   1          Intake/Output Last 24 hours No intake or output data in the 24 hours ending 03/01/19 0142  Labs/Imaging Results for orders placed or performed during the hospital encounter of 02/28/19 (from the past 48 hour(s))  Comprehensive metabolic panel     Status: Abnormal   Collection Time: 02/28/19 10:10 PM  Result Value Ref Range   Sodium 141 135 - 145 mmol/L   Potassium 3.9 3.5 - 5.1 mmol/L   Chloride 106 98 - 111 mmol/L   CO2 18 (L) 22 - 32 mmol/L   Glucose, Bld 125 (H) 70 - 99 mg/dL   BUN 22 8 - 23 mg/dL   Creatinine, Ser 9.62 (H) 0.44 - 1.00 mg/dL   Calcium 9.3 8.9 - 95.2 mg/dL   Total Protein 6.1 (L) 6.5 - 8.1 g/dL   Albumin 3.7 3.5 - 5.0 g/dL   AST 34 15 - 41 U/L   ALT 20 0 -  44 U/L   Alkaline Phosphatase 96 38 - 126 U/L   Total Bilirubin 1.1 0.3 - 1.2 mg/dL   GFR calc non Af Amer 44 (L) >60 mL/min   GFR calc Af Amer 52 (L) >60 mL/min   Anion gap 17 (H) 5 - 15    Comment: Performed at [facility], [address], [place], [place] [address]  CK     Status: Abnormal   Collection Time: 02/28/19 10:10 PM  Result Value Ref Range   Total CK 364 (H) 38 - 234 U/L    Comment: Performed at [facility], [address] [place], [place] [address]  CBC with Differential     Status: Abnormal   Collection Time: 02/28/19 10:10 PM  Result Value Ref Range   WBC 13.2 (H) 4.0 - 10.5 K/uL   RBC 5.06 3.87 - 5.11 MIL/uL   Hemoglobin 14.3 12.0 - 15.0 g/dL   HCT 09.8 11.9 - 14.7 %   MCV 85.8 80.0 - 100.0 fL   MCH 28.3 26.0 - 34.0 pg   MCHC 32.9 30.0 - 36.0 g/dL   RDW 82.9 56.2 - 13.0 %   Platelets 288 150 - 400 K/uL   nRBC  0.0 0.0 - 0.2 %   Neutrophils Relative % 80 %   Neutro Abs 10.7 (H) 1.7 - 7.7 K/uL   Lymphocytes Relative 13 %   Lymphs Abs 1.7 0.7 - 4.0 K/uL   Monocytes Relative 5 %   Monocytes Absolute 0.7 0.1 - 1.0 K/uL   Eosinophils Relative 0 %   Eosinophils Absolute 0.0 0.0 - 0.5 K/uL   Basophils Relative 1 %   Basophils Absolute 0.1 0.0 - 0.1 K/uL   Immature Granulocytes 1 %   Abs Immature Granulocytes 0.07 0.00 - 0.07 K/uL    Comment: Performed at [facility], [address], [place], [place] [address]  Ethanol     Status: None   Collection Time: 02/28/19 10:10 PM  Result Value Ref Range   Alcohol, Ethyl (B) <10 <10 mg/dL    Comment: (NOTE) Lowest detectable limit for serum alcohol is 10 mg/dL. For medical purposes only. Performed at [facility], [address] [place], [place] [address]   Troponin I - Once     Status: None   Collection Time: 02/28/19 10:10 PM  Result Value Ref Range   Troponin I <0.03 <0.03 ng/mL    Comment: Performed at [facility], [address] [place], [place] [address]  SARS Coronavirus 2 (CEPHEID - Performed in [facility] hospital lab), Hosp Order     Status: None   Collection Time: 02/28/19 11:38 PM  Result Value Ref Range   SARS Coronavirus 2 NEGATIVE NEGATIVE    Comment: (NOTE) If result is NEGATIVE SARS-CoV-2 target nucleic acids are NOT DETECTED. The SARS-CoV-2 RNA is generally detectable in upper and lower  respiratory specimens during the acute phase of infection. The lowest  concentration of SARS-CoV-2 viral copies this assay can detect is 250  copies / mL. A negative result does not preclude SARS-CoV-2 infection  and should not be used as the sole basis for treatment or other  patient management decisions.  A negative result may occur with  improper specimen collection / handling, submission of specimen other  than nasopharyngeal swab, presence of viral mutation(s) within the  areas targeted by this assay, and inadequate  number of viral copies  (<250 copies / mL). A negative  result must be combined with clinical  observations, patient history, and epidemiological information. If result is POSITIVE SARS-CoV-2 target nucleic acids are DETECTED. The SARS-CoV-2 RNA is generally detectable in upper and lower  respiratory specimens dur ing the acute phase of infection.  Positive  results are indicative of active infection with SARS-CoV-2.  Clinical  correlation with patient history and other diagnostic information is  necessary to determine patient infection status.  Positive results do  not rule out bacterial infection or co-infection with other viruses. If result is PRESUMPTIVE POSTIVE SARS-CoV-2 nucleic acids MAY BE PRESENT.   A presumptive positive result was obtained on the submitted specimen  and confirmed on repeat testing.  While 2019 novel coronavirus  (SARS-CoV-2) nucleic acids may be present in the submitted sample  additional confirmatory testing may be necessary for epidemiological  and / or clinical management purposes  to differentiate between  SARS-CoV-2 and other Sarbecovirus currently known to infect humans.  If clinically indicated additional testing with an alternate test  methodology [phone]) is advised. The SARS-CoV-2 RNA is generally  detectable in upper and lower respiratory sp ecimens during the acute  phase of infection. The expected result is Negative. Fact Sheet for Patients:  BoilerBrush.com.cy Fact Sheet for Healthcare Providers: https://pope.com/ This test is not yet approved or cleared by the [place] FDA and has been authorized for detection and/or diagnosis of SARS-CoV-2 by FDA under an Emergency Use Authorization (EUA).  This EUA will remain in effect (meaning this test can be used) for the duration of the COVID-19 declaration under Section 564(b)(1) of the Act, 21 U.S.C. section 360bbb-3(b)(1), unless the  authorization is terminated or revoked sooner. Performed at [facility], [address] [place], [place] [address]    Ct Head Wo Contrast  Result Date: 02/28/2019 CLINICAL DATA:  Fall with hematoma to left side of head EXAM: CT HEAD WITHOUT CONTRAST CT CERVICAL SPINE WITHOUT CONTRAST TECHNIQUE: Multidetector CT imaging of the head and cervical spine was performed following the standard protocol without intravenous contrast. Multiplanar CT image reconstructions of the cervical spine were also generated. COMPARISON:  MRI 12/19/2018, CT brain 12/19/2018, MRI 04/17/2015 FINDINGS: CT HEAD FINDINGS Brain: No acute territorial infarction, hemorrhage or intracranial mass. Mild atrophy. Moderate small vessel ischemic changes of the white matter. 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 Stable ventricle size. Vascular: No hyperdense vessels.  Carotid vascular calcification Skull: Normal. Negative for fracture or focal lesion. Sinuses/Orbits: No acute finding. Other: None CT CERVICAL SPINE FINDINGS Alignment: No subluxation.  Facet alignment within normal limits Skull base and vertebrae: No acute fracture. No primary bone lesion or focal pathologic process. Soft tissues and spinal canal: No prevertebral fluid or swelling. No visible canal hematoma. Disc levels: Moderate degenerative changes at C4-C5, C5-C6 and C6-C7. Posterior disc osteophyte complex at multiple levels. Upper chest: Negative.  Mild carotid vascular calcification Other: None IMPRESSION: 1. No CT evidence for acute intracranial abnormality. Atrophy and small vessel ischemic changes of the white matter. 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 Degenerative changes of the cervical spine. No acute osseous abnormality. Electronically Signed   By: Jasmine Pang M.D.   On: 02/28/2019 23:16   Ct Cervical Spine Wo Contrast  Result Date: 02/28/2019 CLINICAL DATA:   Fall with hematoma to left side of head EXAM: CT HEAD WITHOUT CONTRAST CT CERVICAL SPINE WITHOUT CONTRAST TECHNIQUE: Multidetector CT imaging of the head and cervical spine  was performed following the standard protocol without intravenous contrast. Multiplanar CT image reconstructions of the cervical spine were also generated. COMPARISON:  MRI 12/19/2018, CT brain 12/19/2018, MRI 04/17/2015 FINDINGS: CT HEAD FINDINGS Brain: No acute territorial infarction, hemorrhage or intracranial mass. Mild atrophy. Moderate small vessel ischemic changes of the white matter. 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 Stable ventricle size. Vascular: No hyperdense vessels.  Carotid vascular calcification Skull: Normal. Negative for fracture or focal lesion. Sinuses/Orbits: No acute finding. Other: None CT CERVICAL SPINE FINDINGS Alignment: No subluxation.  Facet alignment within normal limits Skull base and vertebrae: No acute fracture. No primary bone lesion or focal pathologic process. Soft tissues and spinal canal: No prevertebral fluid or swelling. No visible canal hematoma. Disc levels: Moderate degenerative changes at C4-C5, C5-C6 and C6-C7. Posterior disc osteophyte complex at multiple levels. Upper chest: Negative.  Mild carotid vascular calcification Other: None IMPRESSION: 1. No CT evidence for acute intracranial abnormality. Atrophy and small vessel ischemic changes of the white matter. 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 Degenerative changes of the cervical spine. No acute osseous abnormality. Electronically Signed   By: Jasmine Pang M.D.   On: 02/28/2019 23:16   Dg Chest Portable 1 View  Result Date: 02/28/2019 CLINICAL DATA:  Altered mental status EXAM: PORTABLE CHEST 1 VIEW COMPARISON:  12/19/2018 FINDINGS: No acute opacity or pleural effusion. Mild scarring at the left base. Normal heart size. No pneumothorax.  IMPRESSION: No active disease. Electronically Signed   By: Jasmine Pang M.D.   On: 02/28/2019 22:52    Pending Labs Unresulted Labs (From admission, onward)    Start     Ordered   03/08/19 0500  Creatinine, serum  (enoxaparin (LOVENOX)    CrCl >/= 30 ml/min)  Weekly,   R    Comments:  while on enoxaparin therapy    03/01/19 0133   03/01/19 0500  Hemoglobin A1c  Tomorrow morning,   R     03/01/19 0133   03/01/19 0500  Lipid panel  Tomorrow morning,   R    Comments:  Fasting    03/01/19 0133   03/01/19 0500  Comprehensive metabolic panel  Tomorrow morning,   R     03/01/19 0133   03/01/19 0500  CBC  Tomorrow morning,   R     03/01/19 0133   03/01/19 0131  CBC  (enoxaparin (LOVENOX)    CrCl >/= 30 ml/min)  Once,   R    Comments:  Baseline for enoxaparin therapy IF NOT ALREADY DRAWN.  Notify MD if PLT < 100 K.    03/01/19 0133   03/01/19 0131  Creatinine, serum  (enoxaparin (LOVENOX)    CrCl >/= 30 ml/min)  Once,   R    Comments:  Baseline for enoxaparin therapy IF NOT ALREADY DRAWN.    03/01/19 0133   02/28/19 2157  Urinalysis, Routine w reflex microscopic  ONCE - STAT,   STAT     02/28/19 2157   02/28/19 2157  Urine rapid drug screen (hosp performed)  ONCE - STAT,   STAT     02/28/19 2157          Vitals/Pain Today's Vitals   03/01/19 0015 03/01/19 0030 03/01/19 0045 03/01/19 0101  BP: (!) 176/90 (!) 190/91 (!) 202/99   Pulse: (!) 101 98 100   Resp: Temp:      TempSrc:  SpO2: 97% 96% 99%   PainSc:    0-No pain    Isolation Precautions No active isolations  Medications Medications  atorvastatin (LIPITOR) tablet 20 mg (has no administration in time range)   stroke: mapping our early stages of recovery book (has no administration in time range)  0.9 %  sodium chloride infusion (has no administration in time range)  acetaminophen (TYLENOL) tablet 650 mg (has no administration in time range)    Or  acetaminophen (TYLENOL) solution 650 mg (has no  administration in time range)    Or  acetaminophen (TYLENOL) suppository 650 mg (has no administration in time range)  enoxaparin (LOVENOX) injection 40 mg (has no administration in time range)  aspirin suppository 300 mg (has no administration in time range)    Or  aspirin tablet 325 mg (has no administration in time range)  insulin aspart (novoLOG) injection 0-9 Units (has no administration in time range)  hydrALAZINE (APRESOLINE) injection 10 mg (has no administration in time range)  iohexol (OMNIPAQUE) 350 MG/ML injection 100 mL (100 mLs Intravenous Contrast Given 03/01/19 0058)    Mobility walks with device High fall risk   Focused Assessments Son updated on pt.'s condition and admission plan .   R Recommendations: See Admitting Provider Note  Report given to:   Additional Notes:

## 2019-03-01 NOTE — Progress Notes (Signed)
Pt reported to the floor alert and oriented times 4. Pt has had a fall in the last 6 months, fall bracelet was placed on pt. Pt stated she was in no pain. Pt was educated about he floor and the call bell system. She was instructed to use the call bell if she was in need of anything. Call bell and phone was placed in pt reach. Required skin check was preformed by two nurse. Pt has a hematoma on the left side of her head due to her fall.

## 2019-03-01 NOTE — Evaluation (Signed)
Physical Therapy Evaluation Patient Details Name: Janice Little MRN: [id number] DOB: 1946-08-02 Today's Date: 03/01/2019   History of Present Illness  Pt is a 73 y/o female who presents from home s/p fall and head injury. Pt unable to determine how long she was down before family found her. MRI revealed an acute infarct right MCA territory as well as small areas of recent infarct in the left frontal parietal lobe likely subacute in nature.  Clinical Impression  Pt admitted with above diagnosis. Pt currently with functional limitations due to the deficits listed below (see PT Problem List). At the time of PT eval pt was able to perform transfers with up to +2 max assist. Attempted transition from bed to [facility] however not able to complete as pt fatigued. Tolerance for functional activity is low at this time, and it appears that as patient fatigues, L side inattention and lethargy become more prominent. PT recommending SNF level rehab to improve strength and functional independence. Acutely, pt will benefit from skilled PT to increase their independence and safety with mobility to allow discharge to the venue listed below.     Follow Up Recommendations SNF;Supervision/Assistance - 24 hour    Equipment Recommendations  None recommended by PT    Recommendations for Other Services       Precautions / Restrictions Precautions Precautions: Fall Restrictions Weight Bearing Restrictions: No      Mobility  Bed Mobility Overal bed mobility: Needs Assistance Bed Mobility: Rolling;Sidelying to Sit Rolling: Mod assist;Max assist Sidelying to sit: Max assist;+2 for physical assistance;HOB elevated       General bed mobility comments: HOB slightly elevated. Pt required +2 assist for transition to EOB. Max assist to get LE's off EOB and +2 for trunk elevation to full sitting position.   Transfers Overall transfer level: Needs assistance Equipment used: Rolling walker (2 wheeled);2 person hand  held assist Transfers: Sit to/from Stand Sit to Stand: Mod assist;Max assist;+2 physical assistance         General transfer comment: Initially, pt stood to RW and required +2 mod assist to power up to full stand. Pt stood ~2 minutes, attempted marching in place (raising heel sonly), and took 1 side step to the L. Pt then reported she had to use the bathroom, and BSC was brought over. Pt fatigued and was unable to transition to the [facility] even with +2 max assist. Pt was returned to supine for bed pan use.   Ambulation/Gait             General Gait Details: Unable to attempt today.   Stairs            Wheelchair Mobility    Modified Rankin (Stroke Patients Only) Modified Rankin (Stroke Patients Only) Pre-Morbid Rankin Score: No significant disability Modified Rankin: Severe disability     Balance Overall balance assessment: Needs assistance Sitting-balance support: Feet supported;Bilateral upper extremity supported Sitting balance-Leahy Scale: Poor Sitting balance - Comments: L lateral lean with difficulty adjusting hips for equal weight R and L Postural control: Left lateral lean Standing balance support: Bilateral upper extremity supported Standing balance-Leahy Scale: Zero Standing balance comment: Up to +2 max assist for standing without a RW.                              Pertinent Vitals/Pain Pain Assessment: Faces Faces Pain Scale: No hurt    Home Living Family/patient expects to be discharged to:: Private residence  Living Arrangements: Alone Available Help at Discharge: Family;Available PRN/intermittently;Neighbor Type of Home: Other(Comment)(condo) Home Access: Stairs to enter Entrance Stairs-Rails: Right Entrance Stairs-Number of Steps: 3 Home Layout: One level Home Equipment: Cane - single point;Walker - 2 wheels;Tub bench      Prior Function Level of Independence: Independent with assistive device(s)         Comments: use of SPC,  was still driving; reports sometimes a friend was picking up groceries for her     Hand Dominance        Extremity/Trunk Assessment   Upper Extremity Assessment Upper Extremity Assessment: Defer to OT evaluation    Lower Extremity Assessment Lower Extremity Assessment: LLE deficits/detail;RLE deficits/detail RLE Deficits / Details: 4+/5 with full effort in quads, hamstrings, hip flexors. 4-/5 in dorsiflexion RLE Sensation: WNL LLE Deficits / Details: 4-/5 in quads, hamstrings, and hip flexors. 3+/5 in dorsiflexion.  LLE Sensation: decreased light touch LLE Coordination: decreased gross motor;decreased fine motor    Cervical / Trunk Assessment Cervical / Trunk Assessment: Other exceptions Cervical / Trunk Exceptions: Forward head posture with rounded shoulders  Communication   Communication: No difficulties  Cognition Arousal/Alertness: Lethargic;Suspect due to medications Behavior During Therapy: Flat affect Overall Cognitive Status: Impaired/Different from baseline Area of Impairment: Attention;Following commands;Safety/judgement;Awareness;Problem solving;Orientation                 Orientation Level: Disoriented to;Time Current Attention Level: Focused   Following Commands: Follows one step commands inconsistently;Follows one step commands with increased time Safety/Judgement: Decreased awareness of safety;Decreased awareness of deficits Awareness: Intellectual Problem Solving: Slow processing;Decreased initiation;Difficulty sequencing;Requires verbal cues;Requires tactile cues General Comments: Pt lethargic. Oriented x3. L-side inattention      General Comments      Exercises     Assessment/Plan    PT Assessment Patient needs continued PT services  PT Problem List Decreased strength;Decreased activity tolerance;Decreased balance;Decreased mobility;Decreased coordination;Decreased knowledge of use of DME;Decreased cognition;Decreased safety  awareness;Decreased knowledge of precautions       PT Treatment Interventions DME instruction;Gait training;Stair training;Functional mobility training;Therapeutic activities;Therapeutic exercise;Neuromuscular re-education;Cognitive remediation;Patient/family education    PT Goals (Current goals can be found in the Care Plan section)  Acute Rehab PT Goals Patient Stated Goal: None stated PT Goal Formulation: Patient unable to participate in goal setting Time For Goal Achievement: 03/15/19 Potential to Achieve Goals: Good    Frequency Min 3X/week   Barriers to discharge        Co-evaluation PT/OT/SLP Co-Evaluation/Treatment: Yes Reason for Co-Treatment: For patient/therapist safety;To address functional/ADL transfers PT goals addressed during session: Mobility/safety with mobility;Balance;Proper use of DME         AM-PAC PT "6 Clicks" Mobility  Outcome Measure Help needed turning from your back to your side while in a flat bed without using bedrails?: A Lot Help needed moving from lying on your back to sitting on the side of a flat bed without using bedrails?: A Lot Help needed moving to and from a bed to a chair (including a wheelchair)?: Total Help needed standing up from a chair using your arms (e.g., wheelchair or bedside chair)?: A Lot Help needed to walk in hospital room?: Total Help needed climbing 3-5 steps with a railing? : Total 6 Click Score: 9    End of Session Equipment Utilized During Treatment: Gait belt Activity Tolerance: Patient limited by fatigue;Patient limited by lethargy Patient left: in bed;with call bell/phone within reach;with bed alarm set Nurse Communication: Mobility status;Need for lift equipment(Left on bedpan in bed - NT  notified) PT Visit Diagnosis: Unsteadiness on feet (R26.81);Other abnormalities of gait and mobility (R26.89);Muscle weakness (generalized) (M62.81);History of falling (Z91.81);Other symptoms and signs involving the nervous  system (R29.898);Hemiplegia and hemiparesis Hemiplegia - Right/Left: Left Hemiplegia - caused by: Cerebral infarction    Time: 0925-1002 PT Time Calculation (min) (ACUTE ONLY): 37 min   Charges:   PT Evaluation $PT Eval Moderate Complexity: 1 Mod          Conni Slipper, PT, DPT Acute Rehabilitation Services Pager: [phone] Office: [phone]   Marylynn Pearson 03/01/2019, 10:59 AM

## 2019-03-01 NOTE — Progress Notes (Signed)
  Echocardiogram 2D Echocardiogram has been performed.  Janice Little 03/01/2019, 2:37 PM

## 2019-03-01 NOTE — ED Notes (Signed)
Attempted to call report

## 2019-03-01 NOTE — Evaluation (Signed)
Speech Language Pathology Evaluation Patient Details Name: Janice Little MRN: [id number] DOB: 1946/02/06 Today's Date: 03/01/2019 Time: 1510-1550 SLP Time Calculation (min) (ACUTE ONLY): 40 min  Problem List:  Patient Active Problem List   Diagnosis Date Noted  . Acute CVA (cerebrovascular accident) (HCC) 03/01/2019  . Hypertensive urgency 12/19/2018  . History of myelitis 12/26/2017  . History of optic neuritis 04/02/2015  . Diplopia 04/02/2015  . Urinary incontinence 04/02/2015  . Other fatigue 04/02/2015  . 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 Numbness 04/02/2015  . Ataxic gait 04/02/2015  . Essential tremor 04/02/2015  . PRIMARY HYPERPARATHYROIDISM 02/01/2008  . OSTEOPOROSIS 02/01/2008  . Diabetes mellitus with diabetic neuropathy (HCC) 01/31/2008  . HYPERLIPIDEMIA 01/31/2008  . Multiple sclerosis (HCC) 01/31/2008  . Optic neuritis 01/31/2008  . HYPERTENSION 01/31/2008  . GERD 01/31/2008   Past Medical History:  Past Medical History:  Diagnosis Date  . Chronic kidney disease (CKD), stage II (mild)   . Chronic pain   . DM type 2 (diabetes mellitus, type 2) (HCC)   . Fatty liver    per CT  . GERD (gastroesophageal reflux disease)   . History of hepatitis as a child    age 38 (jaundice)  . HTN (hypertension)   . Hyperlipidemia   . MS (multiple sclerosis) (HCC)   . Obesity   . Osteopenia   . Primary hyperparathyroidism (HCC)    s/p surgery removal of parathyroid (Dr. Sharl Ma, Dr. Gerrit Friends)  . Vitamin D deficiency    Past Surgical History:  Past Surgical History:  Procedure Laterality Date  . ANTERIOR CRUCIATE LIGAMENT REPAIR Left   . VESICOVAGINAL FISTULA CLOSURE W/ TAH     HPI:  [name] with PMH: DM-2, HTN, hyperlipidemia, MS, who presents from home s/p fall and head injury. Pt unable to determine how long she was down before family found her. MRI revealed an acute infarct right MCA territory as well as small areas of  recent infarct in the left frontal parietal lobe likely subacute in nature.   Assessment / Plan / Recommendation Clinical Impression  Patient presents with a mild-moderate cognitive-linguistic impairment characterized by decreased memory, decreased awareness (with severe left neglect/inattention), slow cognitive processing, decreased functional problem solving and decreased ability to selectively attend. SLP completed majority of evaluation while patient had lunch tray in front of her. She demonstrated some increased awareness to left visual field after SLP cues and instruction, but functionally she required moderate frequency of cues to manage. Patient also exhibits a decreased visual depth perception which was noted when she would try to reach for a cup that was in front of her.     SLP Assessment  SLP Recommendation/Assessment: Patient needs continued Speech Lanaguage Pathology Services SLP Visit Diagnosis: Cognitive communication deficit (R41.841)    Follow Up Recommendations  Inpatient Rehab;Skilled Nursing facility    Frequency and Duration min 2x/week  1 week      SLP Evaluation Cognition  Overall Cognitive Status: Impaired/Different from baseline Arousal/Alertness: Awake/alert Orientation Level: Oriented to situation;Oriented to person;Oriented to place;Disoriented to time Attention: Selective Selective Attention: Impaired Selective Attention Impairment: Verbal basic;Functional basic;Functional complex Memory: Impaired Memory Impairment: Storage deficit;Retrieval deficit Awareness: Impaired Awareness Impairment: Emergent impairment Problem Solving: Impaired Problem Solving Impairment: Functional basic Executive Function: Self Monitoring;Reasoning Reasoning: Impaired Reasoning Impairment: Functional basic;Verbal complex Self Monitoring: Impaired Self Monitoring Impairment: Functional basic;Verbal complex Safety/Judgment: Impaired       Comprehension  Auditory  Comprehension Overall Auditory  Comprehension: Appears within functional limits for tasks assessed    Expression Expression Primary Mode of Expression: Verbal Verbal Expression Overall Verbal Expression: Appears within functional limits for tasks assessed   Oral / Motor  Oral Motor/Sensory Function Overall Oral Motor/Sensory Function: Within functional limits Motor Speech Overall Motor Speech: Appears within functional limits for tasks assessed Phonation: Other (comment)(mildly hoarse voice)   GO                    Janice Little 03/01/2019, 5:43 PM   Janice Nevin, MA, CCC-SLP Speech Therapy [facility] Pager: [phone]

## 2019-03-02 ENCOUNTER — Inpatient Hospital Stay (HOSPITAL_COMMUNITY): Payer: Medicare Other

## 2019-03-02 DIAGNOSIS — E669 Obesity, unspecified: Secondary | ICD-10-CM

## 2019-03-02 DIAGNOSIS — E785 Hyperlipidemia, unspecified: Secondary | ICD-10-CM

## 2019-03-02 DIAGNOSIS — Z8669 Personal history of other diseases of the nervous system and sense organs: Secondary | ICD-10-CM

## 2019-03-02 DIAGNOSIS — G35 Multiple sclerosis: Secondary | ICD-10-CM

## 2019-03-02 DIAGNOSIS — I639 Cerebral infarction, unspecified: Secondary | ICD-10-CM | POA: Diagnosis present

## 2019-03-02 LAB — GLUCOSE, CAPILLARY
Glucose-Capillary: 110 mg/dL — ABNORMAL HIGH (ref 70–99)
Glucose-Capillary: 266 mg/dL — ABNORMAL HIGH (ref 70–99)
Glucose-Capillary: 113 mg/dL — ABNORMAL HIGH (ref 70–99)
Glucose-Capillary: 154 mg/dL — ABNORMAL HIGH (ref 70–99)

## 2019-03-02 MED ORDER — LISINOPRIL 20 MG PO TABS
20.0000 mg | ORAL_TABLET | Freq: Every day | ORAL | Status: DC
  Administered 2019-03-02 – 2019-03-04 (×3): 20 mg via ORAL
  Filled 2019-03-02 (×3): qty 1

## 2019-03-02 MED ORDER — HYDRALAZINE HCL 50 MG PO TABS
50.0000 mg | ORAL_TABLET | Freq: Three times a day (TID) | ORAL | Status: DC
  Administered 2019-03-02 – 2019-03-05 (×9): 50 mg via ORAL
  Filled 2019-03-02 (×10): qty 1

## 2019-03-02 NOTE — Plan of Care (Signed)
Care plans reviewed and patient is progressing.  

## 2019-03-02 NOTE — Progress Notes (Signed)
Update given to son, Thayer Ohm.  Discussed pending discharge to [facility].

## 2019-03-02 NOTE — Progress Notes (Signed)
STROKE TEAM PROGRESS NOTE   SUBJECTIVE (INTERVAL HISTORY) Pt is sitting in chair, eating lunch. Mild flat affect, but following commands and appropriate conversation. Discussed about loop recorder and pt is willing to proceed. Will inform EP.   OBJECTIVE Vitals:   03/01/19 2032 03/01/19 2210 03/02/19 0110 03/02/19 0414  BP: (!) 160/68 (!) 181/83 (!) 194/74 (!) 186/81  Pulse: 87 82 77 78  Resp: (!) 24 (!) 23 18 (!) 22  Temp: 97.8 F (36.6 C)  97.8 F (36.6 C) 97.9 F (36.6 C)  TempSrc: Oral  Oral Axillary  SpO2: 98% 95% 96% 98%  Weight:      Height:        CBC:  Recent Labs  Lab 02/28/19 2210 03/01/19 0247  WBC 13.2* 12.8*  NEUTROABS 10.7*  --   HGB 14.3 13.8  HCT 43.4 41.5  MCV 85.8 84.5  PLT 288 291    Basic Metabolic Panel:  Recent Labs  Lab 02/28/19 2210 03/01/19 0247  NA 141 139  K 3.9 3.9  CL 106 99  CO2 18* 20*  GLUCOSE 125* 106*  BUN 22 21  CREATININE 1.20* 1.10*  CALCIUM 9.3 9.3    Lipid Panel:     Component Value Date/Time   CHOL 123 03/01/2019 0247   TRIG 82 03/01/2019 0247   HDL 70 03/01/2019 0247   CHOLHDL 1.8 03/01/2019 0247   VLDL 16 03/01/2019 0247   LDLCALC 37 03/01/2019 0247   HgbA1c:  Lab Results  Component Value Date   HGBA1C 5.6 03/01/2019   Urine Drug Screen:     Component Value Date/Time   LABOPIA NONE DETECTED 12/20/2018 1850   COCAINSCRNUR NONE DETECTED 12/20/2018 1850   LABBENZ NONE DETECTED 12/20/2018 1850   AMPHETMU NONE DETECTED 12/20/2018 1850   THCU NONE DETECTED 12/20/2018 1850   LABBARB NONE DETECTED 12/20/2018 1850    Alcohol Level     Component Value Date/Time   ETH <10 02/28/2019 2210    IMAGING   Ct Angio Head W Or Wo Contrast  Result Date: 03/01/2019 CLINICAL DATA:  Initial evaluation for acute altered mental status, possible visual field cut. EXAM: CT ANGIOGRAPHY HEAD AND NECK CT PERFUSION BRAIN TECHNIQUE: Multidetector CT imaging of the head and neck was performed using the standard protocol  during bolus administration of intravenous contrast. Multiplanar CT image reconstructions and MIPs were obtained to evaluate the vascular anatomy. Carotid stenosis measurements (when applicable) are obtained utilizing NASCET criteria, using the distal internal carotid diameter as the denominator. Multiphase CT imaging of the brain was performed following IV bolus contrast injection. Subsequent parametric perfusion maps were calculated using RAPID software. CONTRAST:  OMNIPAQUE IOHEXOL 350 MG/ML SOLN COMPARISON:  Prior CT from earlier the same evening. FINDINGS: CT HEAD FINDINGS Brain: Generalized age-related cerebral atrophy with moderate chronic small vessel ischemic disease. Multiple chronic lacunar infarcts noted within the bilateral basal ganglia and right thalamus. Extremely subtle loss of gray-white matter differentiation with evolving hypodensity seen at the posterior right temporal region (series 5, image 16), suspicious for of all vein posterior right MCA territory infarct. This correlates with area of ischemic penumbra on concomitant perfusion exam. Otherwise, gray-white matter differentiation maintained with no other acute large vessel territory infarct. No acute intracranial hemorrhage. No mass lesion, midline shift or mass effect. No hydrocephalus. No extra-axial fluid collection. Vascular: Single focal hyperdensity involving a proximal right M2 branch, favored to reflect focal atherosclerotic plaque rather than thrombus (series 8, image 20). No other hyperdense vessel. Scattered  vascular calcifications noted within the carotid siphons. Skull: Scalp soft tissues and calvarium within normal limits. Sinuses/Orbits: Globes and orbital soft tissues within normal limits. Paranasal sinuses are largely clear. No mastoid effusion. Other: None. ASPECTS ([place] Stroke Program Early CT Score) - Ganglionic level infarction (caudate, lentiform nuclei, internal capsule, insula, M1-M3 cortex): 6 -  Supraganglionic infarction (M4-M6 cortex): 3 Total score (0-10 with 10 being normal): 9 Review of the MIP images confirms the above findings CTA NECK FINDINGS Aortic arch: Visualized aortic arch of normal caliber with normal 3 vessel morphology. Mild scattered atheromatous plaque about the arch and origin of the great vessels without hemodynamically significant stenosis. Visualized subclavian arteries widely patent. Right carotid system: Right common carotid artery patent from its origin to the bifurcation without stenosis. Mixed plaque about the right bifurcation/proximal right ICA without hemodynamically significant stenosis. Right ICA widely patent distally to the skull base without stenosis, dissection or occlusion. Left carotid system: Left common carotid artery patent from its origin to the bifurcation without stenosis. No significant atheromatous change or narrowing about the left bifurcation. Left ICA widely patent distally to the skull base without stenosis, dissection, or occlusion. Vertebral arteries: Both of the vertebral arteries arise from the subclavian arteries. Vertebral arteries widely patent within the neck without stenosis, dissection or occlusion. Right vertebral artery slightly dominant. Skeleton: No acute osseous finding. No discrete worrisome lytic or blastic osseous lesions. Eagle syndrome noted. Other neck: No other acute soft tissue abnormality about the neck. Scattered calcified sialoliths with multifocal stones at the right floor of mouth. Postsurgical changes noted at the left thyroid lobe. Upper chest: Visualized upper chest demonstrates no acute finding. Review of the MIP images confirms the above findings CTA HEAD FINDINGS Anterior circulation: Petrous segments patent bilaterally. Mild atheromatous plaque within the carotid siphons without hemodynamically significant stenosis. A1 segments widely patent. Normal anterior communicating artery. Anterior cerebral arteries patent to their  distal aspects without stenosis. Left M1 widely patent. Normal left MCA bifurcation. Distal left MCA branches well perfused. Right M1 widely patent. Normal right MCA bifurcation. Abrupt cut off of a proximal right M2 branch inferior division, favored to reflect a severe short-segment stenosis (series 11, image 97). Flow is seen distally, although is fairly attenuated. Superior division remains patent as does its distal branches. Posterior circulation: Vertebral arteries patent to the vertebrobasilar junction without stenosis. Posterior inferior cerebral arteries not well assessed on this examination. Basilar artery mildly diminutive but patent to its distal aspect without stenosis. Superior cerebral arteries patent bilaterally. Left PCA arises from the basilar. Fetal type origin of the right PCA. PCAs widely patent to their distal aspects without stenosis. Venous sinuses: Grossly patent allowing for timing of the contrast bolus. Anatomic variants: Fetal type right PCA. Delayed phase: Not performed. Review of the MIP images confirms the above findings CT Brain Perfusion Findings: ASPECTS: 9 CBF (<30%) Volume: 0mL Perfusion (Tmax>6.0s) volume: 18mL Mismatch Volume: 18mL Infarction Location:Negative CT perfusion for core infarct. 18 cc ischemic penumbra at the posterior right temporal lobe, posterior right MCA distribution. IMPRESSION: 1. Abrupt cut off of a proximal right M2 branch, inferior division, favored to reflect a short-segment severe high-grade stenosis. Flow is seen distally within the right MCA branches, although is attenuated and reduced. 2. Focal 18 cc ischemic penumbra at the posterior right temporal lobe, corresponding with the right M2 severe stenosis. Small occult core infarct in this region is suspected given the subtle early changes on the noncontrast head CT portion of this exam. 3. Mild  atherosclerotic change about the aortic arch, right carotid bifurcation, and carotid siphons without  hemodynamically significant stenosis. Critical Value/emergent results were called by telephone at the time of interpretation on 03/01/2019 at 1:26 am to Dr. Ritta Slot , who verbally acknowledged these results. Electronically Signed   By: Rise Mu M.D.   On: 03/01/2019 01:56   Dg Pelvis 1-2 Views  Result Date: 03/01/2019 CLINICAL DATA:  Recent fall. Unable to inverted left foot. Question stroke. EXAM: PELVIS - 1-2 VIEW COMPARISON:  None FINDINGS: Contrast fills the urinary bladder.  Bladder is unremarkable. Pelvis is intact.  Lower lumbar spine is within. Lucency is noted along the greater trochanter. This may represent an avulsion fracture. IMPRESSION: 1. Possible avulsion fracture along the greater trochanter. Recommend dedicated radiographs of the left hip further evaluation. 2. The pelvis is otherwise unremarkable. Electronically Signed   By: Marin Roberts M.D.   On: 03/01/2019 08:37   Ct Head Wo Contrast  Result Date: 03/01/2019 CLINICAL DATA:  Stroke follow-up.  Change in mental status. EXAM: CT HEAD WITHOUT CONTRAST TECHNIQUE: Contiguous axial images were obtained from the base of the skull through the vertex without intravenous contrast. COMPARISON:  03/01/2019 FINDINGS: Brain: Unchanged does appearance of multifocal chronic ischemic change, predominantly within the right hemisphere. Unchanged old right caudothalamic small vessel infarct. No intracranial hemorrhage. No midline shift or other mass effect. Unchanged size and configuration of ventricles. Vascular: No abnormal hyperdensity of the major intracranial arteries or dural venous sinuses. No intracranial atherosclerosis. Skull: The visualized skull base, calvarium and extracranial soft tissues are normal. Sinuses/Orbits: No fluid levels or advanced mucosal thickening of the visualized paranasal sinuses. No mastoid or middle ear effusion. The orbits are normal. IMPRESSION: Unchanged examination with findings of chronic  small vessel ischemia. No acute abnormality. Electronically Signed   By: Deatra Robinson M.D.   On: 03/01/2019 22:33   Ct Head Wo Contrast  Result Date: 02/28/2019 CLINICAL DATA:  Fall with hematoma to left side of head EXAM: CT HEAD WITHOUT CONTRAST CT CERVICAL SPINE WITHOUT CONTRAST TECHNIQUE: Multidetector CT imaging of the head and cervical spine was performed following the standard protocol without intravenous contrast. Multiplanar CT image reconstructions of the cervical spine were also generated. COMPARISON:  MRI 12/19/2018, CT brain 12/19/2018, MRI 04/17/2015 FINDINGS: CT HEAD FINDINGS Brain: No acute territorial infarction, hemorrhage or intracranial mass. Mild atrophy. Moderate small vessel ischemic changes of the white matter. 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 Stable ventricle size. Vascular: No hyperdense vessels.  Carotid vascular calcification Skull: Normal. Negative for fracture or focal lesion. Sinuses/Orbits: No acute finding. Other: None CT CERVICAL SPINE FINDINGS Alignment: No subluxation.  Facet alignment within normal limits Skull base and vertebrae: No acute fracture. No primary bone lesion or focal pathologic process. Soft tissues and spinal canal: No prevertebral fluid or swelling. No visible canal hematoma. Disc levels: Moderate degenerative changes at C4-C5, C5-C6 and C6-C7. Posterior disc osteophyte complex at multiple levels. Upper chest: Negative.  Mild carotid vascular calcification Other: None IMPRESSION: 1. No CT evidence for acute intracranial abnormality. Atrophy and small vessel ischemic changes of the white matter. 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 Degenerative changes of the cervical spine. No acute osseous abnormality. Electronically Signed   By: Jasmine Pang M.D.   On: 02/28/2019 23:16   Ct Angio Neck W Or Wo Contrast  Result Date: 03/01/2019 CLINICAL DATA:  Initial  evaluation for acute altered  mental status, possible visual field cut. EXAM: CT ANGIOGRAPHY HEAD AND NECK CT PERFUSION BRAIN TECHNIQUE: Multidetector CT imaging of the head and neck was performed using the standard protocol during bolus administration of intravenous contrast. Multiplanar CT image reconstructions and MIPs were obtained to evaluate the vascular anatomy. Carotid stenosis measurements (when applicable) are obtained utilizing NASCET criteria, using the distal internal carotid diameter as the denominator. Multiphase CT imaging of the brain was performed following IV bolus contrast injection. Subsequent parametric perfusion maps were calculated using RAPID software. CONTRAST:  OMNIPAQUE IOHEXOL 350 MG/ML SOLN COMPARISON:  Prior CT from earlier the same evening. FINDINGS: CT HEAD FINDINGS Brain: Generalized age-related cerebral atrophy with moderate chronic small vessel ischemic disease. Multiple chronic lacunar infarcts noted within the bilateral basal ganglia and right thalamus. Extremely subtle loss of gray-white matter differentiation with evolving hypodensity seen at the posterior right temporal region (series 5, image 16), suspicious for of all vein posterior right MCA territory infarct. This correlates with area of ischemic penumbra on concomitant perfusion exam. Otherwise, gray-white matter differentiation maintained with no other acute large vessel territory infarct. No acute intracranial hemorrhage. No mass lesion, midline shift or mass effect. No hydrocephalus. No extra-axial fluid collection. Vascular: Single focal hyperdensity involving a proximal right M2 branch, favored to reflect focal atherosclerotic plaque rather than thrombus (series 8, image 20). No other hyperdense vessel. Scattered vascular calcifications noted within the carotid siphons. Skull: Scalp soft tissues and calvarium within normal limits. Sinuses/Orbits: Globes and orbital soft tissues within normal limits. Paranasal  sinuses are largely clear. No mastoid effusion. Other: None. ASPECTS ([place] Stroke Program Early CT Score) - Ganglionic level infarction (caudate, lentiform nuclei, internal capsule, insula, M1-M3 cortex): 6 - Supraganglionic infarction (M4-M6 cortex): 3 Total score (0-10 with 10 being normal): 9 Review of the MIP images confirms the above findings CTA NECK FINDINGS Aortic arch: Visualized aortic arch of normal caliber with normal 3 vessel morphology. Mild scattered atheromatous plaque about the arch and origin of the great vessels without hemodynamically significant stenosis. Visualized subclavian arteries widely patent. Right carotid system: Right common carotid artery patent from its origin to the bifurcation without stenosis. Mixed plaque about the right bifurcation/proximal right ICA without hemodynamically significant stenosis. Right ICA widely patent distally to the skull base without stenosis, dissection or occlusion. Left carotid system: Left common carotid artery patent from its origin to the bifurcation without stenosis. No significant atheromatous change or narrowing about the left bifurcation. Left ICA widely patent distally to the skull base without stenosis, dissection, or occlusion. Vertebral arteries: Both of the vertebral arteries arise from the subclavian arteries. Vertebral arteries widely patent within the neck without stenosis, dissection or occlusion. Right vertebral artery slightly dominant. Skeleton: No acute osseous finding. No discrete worrisome lytic or blastic osseous lesions. Eagle syndrome noted. Other neck: No other acute soft tissue abnormality about the neck. Scattered calcified sialoliths with multifocal stones at the right floor of mouth. Postsurgical changes noted at the left thyroid lobe. Upper chest: Visualized upper chest demonstrates no acute finding. Review of the MIP images confirms the above findings CTA HEAD FINDINGS Anterior circulation: Petrous segments patent  bilaterally. Mild atheromatous plaque within the carotid siphons without hemodynamically significant stenosis. A1 segments widely patent. Normal anterior communicating artery. Anterior cerebral arteries patent to their distal aspects without stenosis. Left M1 widely patent. Normal left MCA bifurcation. Distal left MCA branches well perfused. Right M1 widely patent. Normal right MCA bifurcation. Abrupt cut off of a proximal right M2 branch  inferior division, favored to reflect a severe short-segment stenosis (series 11, image 97). Flow is seen distally, although is fairly attenuated. Superior division remains patent as does its distal branches. Posterior circulation: Vertebral arteries patent to the vertebrobasilar junction without stenosis. Posterior inferior cerebral arteries not well assessed on this examination. Basilar artery mildly diminutive but patent to its distal aspect without stenosis. Superior cerebral arteries patent bilaterally. Left PCA arises from the basilar. Fetal type origin of the right PCA. PCAs widely patent to their distal aspects without stenosis. Venous sinuses: Grossly patent allowing for timing of the contrast bolus. Anatomic variants: Fetal type right PCA. Delayed phase: Not performed. Review of the MIP images confirms the above findings CT Brain Perfusion Findings: ASPECTS: 9 CBF (<30%) Volume: 65mL Perfusion (Tmax>6.0s) volume: 65mL Mismatch Volume: 79mL Infarction Location:Negative CT perfusion for core infarct. 18 cc ischemic penumbra at the posterior right temporal lobe, posterior right MCA distribution. IMPRESSION: 1. Abrupt cut off of a proximal right M2 branch, inferior division, favored to reflect a short-segment severe high-grade stenosis. Flow is seen distally within the right MCA branches, although is attenuated and reduced. 2. Focal 18 cc ischemic penumbra at the posterior right temporal lobe, corresponding with the right M2 severe stenosis. Small occult core infarct in this  region is suspected given the subtle early changes on the noncontrast head CT portion of this exam. 3. Mild atherosclerotic change about the aortic arch, right carotid bifurcation, and carotid siphons without hemodynamically significant stenosis. Critical Value/emergent results were called by telephone at the time of interpretation on 03/01/2019 at 1:26 am to Dr. Ritta Slot , who verbally acknowledged these results. Electronically Signed   By: Rise Mu M.D.   On: 03/01/2019 01:56   Ct Cervical Spine Wo Contrast  Result Date: 02/28/2019 CLINICAL DATA:  Fall with hematoma to left side of head EXAM: CT HEAD WITHOUT CONTRAST CT CERVICAL SPINE WITHOUT CONTRAST TECHNIQUE: Multidetector CT imaging of the head and cervical spine was performed following the standard protocol without intravenous contrast. Multiplanar CT image reconstructions of the cervical spine were also generated. COMPARISON:  MRI 12/19/2018, CT brain 12/19/2018, MRI 04/17/2015 FINDINGS: CT HEAD FINDINGS Brain: No acute territorial infarction, hemorrhage or intracranial mass. Mild atrophy. Moderate small vessel ischemic changes of the white matter. 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 Stable ventricle size. Vascular: No hyperdense vessels.  Carotid vascular calcification Skull: Normal. Negative for fracture or focal lesion. Sinuses/Orbits: No acute finding. Other: None CT CERVICAL SPINE FINDINGS Alignment: No subluxation.  Facet alignment within normal limits Skull base and vertebrae: No acute fracture. No primary bone lesion or focal pathologic process. Soft tissues and spinal canal: No prevertebral fluid or swelling. No visible canal hematoma. Disc levels: Moderate degenerative changes at C4-C5, C5-C6 and C6-C7. Posterior disc osteophyte complex at multiple levels. Upper chest: Negative.  Mild carotid vascular calcification Other: None IMPRESSION: 1. No CT evidence for  acute intracranial abnormality. Atrophy and small vessel ischemic changes of the white matter. 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 Degenerative changes of the cervical spine. No acute osseous abnormality. Electronically Signed   By: Jasmine Pang M.D.   On: 02/28/2019 23:16   Mr Brain Wo Contrast  Result Date: 03/01/2019 CLINICAL DATA:  Stroke. History of diabetes, hyperlipidemia, hypertension. History of multiple sclerosis. EXAM: MRI HEAD WITHOUT CONTRAST TECHNIQUE: Multiplanar, multiecho pulse sequences of the brain and surrounding structures were obtained without intravenous contrast. COMPARISON:  CTA 03/01/2019.  MRI head 12/19/2018 FINDINGS: Brain: Acute infarct in the right posterior temporoparietal lobe. Multiple small patchy areas of restricted diffusion are seen in this territory. Occlusion of right M2 branch noted on CTA. Small areas of acute or subacute infarct in the left posterior parietal lobe and left frontal lobe over the convexity. Ventricle size is normal. Mild atrophy. Extensive changes throughout the periventricular deep white matter bilaterally. Chronic lacunar infarctions in the basal ganglia bilaterally brainstem and cerebellum intact. Several areas of chronic microhemorrhage in the brain. Negative for mass or edema. Vascular: Normal arterial flow voids Skull and upper cervical spine: Negative Sinuses/Orbits: Negative Other: None IMPRESSION: Acute infarct right MCA territory corresponding to right M2 occlusion on CTA. This involves the posterior temporoparietal lobe. Small areas of recent infarct in the left frontal parietal lobe likely subacute nature. Possible emboli. Advanced chronic white matter changes. This is likely due to a combination of chronic microvascular ischemia and multiple sclerosis. Electronically Signed   By: Marlan Palau M.D.   On: 03/01/2019 08:04   Ct Cerebral Perfusion W Contrast  Result Date: 03/01/2019 CLINICAL DATA:   Initial evaluation for acute altered mental status, possible visual field cut. EXAM: CT ANGIOGRAPHY HEAD AND NECK CT PERFUSION BRAIN TECHNIQUE: Multidetector CT imaging of the head and neck was performed using the standard protocol during bolus administration of intravenous contrast. Multiplanar CT image reconstructions and MIPs were obtained to evaluate the vascular anatomy. Carotid stenosis measurements (when applicable) are obtained utilizing NASCET criteria, using the distal internal carotid diameter as the denominator. Multiphase CT imaging of the brain was performed following IV bolus contrast injection. Subsequent parametric perfusion maps were calculated using RAPID software. CONTRAST:  OMNIPAQUE IOHEXOL 350 MG/ML SOLN COMPARISON:  Prior CT from earlier the same evening. FINDINGS: CT HEAD FINDINGS Brain: Generalized age-related cerebral atrophy with moderate chronic small vessel ischemic disease. Multiple chronic lacunar infarcts noted within the bilateral basal ganglia and right thalamus. Extremely subtle loss of gray-white matter differentiation with evolving hypodensity seen at the posterior right temporal region (series 5, image 16), suspicious for of all vein posterior right MCA territory infarct. This correlates with area of ischemic penumbra on concomitant perfusion exam. Otherwise, gray-white matter differentiation maintained with no other acute large vessel territory infarct. No acute intracranial hemorrhage. No mass lesion, midline shift or mass effect. No hydrocephalus. No extra-axial fluid collection. Vascular: Single focal hyperdensity involving a proximal right M2 branch, favored to reflect focal atherosclerotic plaque rather than thrombus (series 8, image 20). No other hyperdense vessel. Scattered vascular calcifications noted within the carotid siphons. Skull: Scalp soft tissues and calvarium within normal limits. Sinuses/Orbits: Globes and orbital soft tissues within normal limits.  Paranasal sinuses are largely clear. No mastoid effusion. Other: None. ASPECTS ([place] Stroke Program Early CT Score) - Ganglionic level infarction (caudate, lentiform nuclei, internal capsule, insula, M1-M3 cortex): 6 - Supraganglionic infarction (M4-M6 cortex): 3 Total score (0-10 with 10 being normal): 9 Review of the MIP images confirms the above findings CTA NECK FINDINGS Aortic arch: Visualized aortic arch of normal caliber with normal 3 vessel morphology. Mild scattered atheromatous plaque about the arch and origin of the great vessels without hemodynamically significant stenosis. Visualized subclavian arteries widely patent. Right carotid system: Right common carotid artery patent from its origin to the bifurcation without stenosis. Mixed plaque about the right bifurcation/proximal right ICA without hemodynamically significant stenosis. Right ICA widely patent distally to the skull base without stenosis, dissection or occlusion. Left carotid system: Left common  carotid artery patent from its origin to the bifurcation without stenosis. No significant atheromatous change or narrowing about the left bifurcation. Left ICA widely patent distally to the skull base without stenosis, dissection, or occlusion. Vertebral arteries: Both of the vertebral arteries arise from the subclavian arteries. Vertebral arteries widely patent within the neck without stenosis, dissection or occlusion. Right vertebral artery slightly dominant. Skeleton: No acute osseous finding. No discrete worrisome lytic or blastic osseous lesions. Eagle syndrome noted. Other neck: No other acute soft tissue abnormality about the neck. Scattered calcified sialoliths with multifocal stones at the right floor of mouth. Postsurgical changes noted at the left thyroid lobe. Upper chest: Visualized upper chest demonstrates no acute finding. Review of the MIP images confirms the above findings CTA HEAD FINDINGS Anterior circulation: Petrous segments  patent bilaterally. Mild atheromatous plaque within the carotid siphons without hemodynamically significant stenosis. A1 segments widely patent. Normal anterior communicating artery. Anterior cerebral arteries patent to their distal aspects without stenosis. Left M1 widely patent. Normal left MCA bifurcation. Distal left MCA branches well perfused. Right M1 widely patent. Normal right MCA bifurcation. Abrupt cut off of a proximal right M2 branch inferior division, favored to reflect a severe short-segment stenosis (series 11, image 97). Flow is seen distally, although is fairly attenuated. Superior division remains patent as does its distal branches. Posterior circulation: Vertebral arteries patent to the vertebrobasilar junction without stenosis. Posterior inferior cerebral arteries not well assessed on this examination. Basilar artery mildly diminutive but patent to its distal aspect without stenosis. Superior cerebral arteries patent bilaterally. Left PCA arises from the basilar. Fetal type origin of the right PCA. PCAs widely patent to their distal aspects without stenosis. Venous sinuses: Grossly patent allowing for timing of the contrast bolus. Anatomic variants: Fetal type right PCA. Delayed phase: Not performed. Review of the MIP images confirms the above findings CT Brain Perfusion Findings: ASPECTS: 9 CBF (<30%) Volume: 0mL Perfusion (Tmax>6.0s) volume: 18mL Mismatch Volume: 18mL Infarction Location:Negative CT perfusion for core infarct. 18 cc ischemic penumbra at the posterior right temporal lobe, posterior right MCA distribution. IMPRESSION: 1. Abrupt cut off of a proximal right M2 branch, inferior division, favored to reflect a short-segment severe high-grade stenosis. Flow is seen distally within the right MCA branches, although is attenuated and reduced. 2. Focal 18 cc ischemic penumbra at the posterior right temporal lobe, corresponding with the right M2 severe stenosis. Small occult core infarct in  this region is suspected given the subtle early changes on the noncontrast head CT portion of this exam. 3. Mild atherosclerotic change about the aortic arch, right carotid bifurcation, and carotid siphons without hemodynamically significant stenosis. Critical Value/emergent results were called by telephone at the time of interpretation on 03/01/2019 at 1:26 am to Dr. Ritta Slot , who verbally acknowledged these results. Electronically Signed   By: Rise Mu M.D.   On: 03/01/2019 01:56   Dg Chest Portable 1 View  Result Date: 02/28/2019 CLINICAL DATA:  Altered mental status EXAM: PORTABLE CHEST 1 VIEW COMPARISON:  12/19/2018 FINDINGS: No acute opacity or pleural effusion. Mild scarring at the left base. Normal heart size. No pneumothorax. IMPRESSION: No active disease. Electronically Signed   By: Jasmine Pang M.D.   On: 02/28/2019 22:52   Transthoracic Echocardiogram   1. The left ventricle has hyperdynamic systolic function, with an ejection fraction of >65%. The cavity size was normal. There is mildly increased left ventricular wall thickness. Left ventricular diastolic Doppler parameters are consistent with  impaired relaxation.  2. The right ventricle has normal systolic function. The cavity was normal. There is no increase in right ventricular wall thickness.  3. No evidence of mitral valve stenosis.  4. No stenosis of the aortic valve.  5. The aortic root and ascending aorta are normal in size and structure.  EKG -sinus rhythm.  Nonspecific T wave abnormalities.  PHYSICAL EXAM  Temp:  [97.6 F (36.4 C)-98.1 F (36.7 C)] 97.6 F (36.4 C) (05/30 0921) Pulse Rate:  [67-87] 67 (05/30 0921) Resp:  [14-24] 19 (05/30 0921) BP: (144-194)/(64-86) 183/70 (05/30 0921) SpO2:  [93 %-98 %] 97 % (05/30 0921)  General - Well nourished, well developed, in no apparent distress.  Ophthalmologic - fundi not visualized due to noncooperation.  Cardiovascular - Regular rate and  rhythm.  Mental Status -  Level of arousal and orientation to time, place, and person were intact. Language including expression, naming, repetition, comprehension was assessed and found intact. Mild flat affect and psychomotor slowing  Cranial Nerves II - XII - II - left hemianopia. III, IV, VI - right gaze preference but able to move across midline, left gaze incomplete. V - Facial sensation intact bilaterally. VII - left mild nasolabial fold flattening. VIII - Hearing & vestibular intact bilaterally. X - Palate elevates symmetrically. XI - Chin turning & shoulder shrug intact bilaterally. XII - Tongue protrusion intact.  Motor Strength - The patient's strength was normal in all extremities and pronator drift was absent except LUE proximal 4+/5 and LLE distal 4/5.  Bulk was normal and fasciculations were absent.   Motor Tone - Muscle tone was assessed at the neck and appendages and was normal.  Reflexes - The patient's reflexes were symmetrical in all extremities and she had no pathological reflexes.  Sensory - Light touch, temperature/pinprick were assessed and were symmetrical.    Coordination - The patient had normal movements in the right handwith no ataxia or dysmetria, however, left FTN mild dysmetria.  Tremor was absent.  Gait and Station - deferred.   ASSESSMENT/PLAN Ms. Janice Little is a 73 y.o. female with history of diabetes and hyperlipidemia, hypertension, CKD, multiple sclerosis, obesity, primary hyperparathyroidism status post surgery presenting with fall followed by altered mental status and left hemiparesis. She did not receive IV t-PA due to unknown last known normal  Stroke: Right MCA patchy infarcts and left ACA subacute punctate infarct - cardioembolic pattern, source unclear   Resultant left hemianopia, left mild hemiparesis  CT head no acute findings on CT  MRI head -Scattered distal right posterior temporoparietal MCA infarcts, there are also some  small infarcts seen in left posterior parietal/frontal that are less intense on DWI, likely subacute in nature.  CTA H&N - R ICA proximal and bilateral siphon atherosclerosis. R M2 cut off in inferior division.   CTP 18 cc penumbra  2D Echo - EF more than 65%.  No wall motion abnormalities.  LE venous Doppler negative for DVT  Recommend loop recorder to rule out A. fib  Sars Corona Virus 2 neg  LDL - 37  HgbA1c - 5.6  UDS - neg  VTE prophylaxis - Lovenox  Patient taking estrogen PTA for hot flashes, recommended patient to discuss with her PCP/GYN for alternatives if able.  She is in agreement  none prior to admission, now on ASA 325 + Plavix for 3 months, given degree of intracranial stenosis then monotherapy with ASA only  Patient counseled to be compliant with her antithrombotic medications when able  Ongoing aggressive  stroke risk factor management  Therapy recommendations:  pending  Disposition:  Pending  Hypertension  Stable on the higher end  Resume home medication hydralazine and lisinopril . Long-term BP goal normotensive  Hyperlipidemia  Lipid lowering medication PTA:  lipitor 20  LDL 37, goal < 70  Hold off Lipitor as her LDL is low  Continue follow-up with PCP as outpatient  Multiple sclerosis  Has been following with Dr. 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 After hours, contact General Neurology

## 2019-03-02 NOTE — Progress Notes (Signed)
PROGRESS NOTE    Janice Little  ZOX:[id number] DOB: [birth year] DOA: 02/28/2019 PCP: Juluis Rainier, MD     Brief Narrative:  As per H&P written by Dr. Toniann Fail on 03/01/2019 73 y.o. female with history of hypertension, diabetes mellitus type 2, hyperlipidemia and multiple sclerosis was last seen by family on Monday 4 days ago was not able to be reached and so patient family called for wellness check and patient was found to be on the floor in her room.  Patient appeared mildly confused but was following commands and was brought to the ER.  Patient states she has fallen on Monday 4 days ago and not sure how she fell.  States she has not taken her medications since her fall.  ED Course: In the ER on exam patient not able to move her eyes towards the left.  And also has neglect on the left side and has mild weakness of the left upper and lower extremity.  CT head was unremarkable.  On-call neurologist Dr. Amada Jupiter was consulted and patient admitted for acute CVA.  Blood pressure is markedly elevated given the acute CVA will allow for permissive hypertension.  Patient denies any chest pain shortness of breath or any pain around the extremities.  EKG shows normal sinus rhythm.  Labs show creatinine of 1.2 hemoglobin 14.3 WBC 13.2 platelets 288.  CT of the C-spine was unremarkable chest x-ray was unremarkable.  Assessment & Plan: 1-acute CVA (cerebrovascular accident) (HCC) -With MRI demonstrating a scatted distal right posterior temporoparietal MCA infarct; there is also surrounding small infarct seen in left posterior parietal/frontal that are less intense on DWI. -CTA head and neck: Demonstrating right ICA proximal and bilateral siphon atherosclerosis.  R M2 cutoff in inferior deviation appreciated. -At this moment following neurology recommendations will use aspirin and Plavix for 27-month with subsequent use of aspirin alone for secondary prevention. -Patient will require a loop  recorder to rule out arrhythmia. -Follow PT, OT and speech therapy recommendations (so far suggesting CIR). -LDL 37 -A1c 5.6  2-essential hypertension -Stable currently, permissive hypertension in the setting of acute ischemia -Will resume the use of hydralazine and lisinopril over the next 5 days -Follow vital signs.  3-history of multiple sclerosis/optic neuritis -Continue outpatient follow-up [facility]  4-upper lipidemia -LDL 37 -Will hold Lipitor for now given low LDL -LFTs within normal limits.  5-class I obesity -Low calorie diet, portion control and increase physical activity discussed with patient -Body mass index is 32.03 kg/m.  6-primary hyperparathyroidism -Status post surgery -Stable calcium level.  7-physical deconditioning/global left residual weakness -In the setting of residual deficit from stroke -Follow PT/OT recommendation -So far recommending CIR.  DVT prophylaxis: Lovenox Code Status: Full  Family Communication: no family at bedside. Disposition Plan: So far speech therapy recommending CIR; PT/OT pending evaluation.  Neurology has recommended continued use of aspirin and Plavix for 3 months with subsequent use of aspirin for secondary prevention.  Patient will need a loop recorder implantation.  Consultants:   Neurology service  Procedures:   See below for x-ray report  2D echo: Demonstrating preserved ejection fraction, impaired relaxation of ventricular walls suggesting diastolic dysfunction; no wall motion and normalities.  Mild increased left ventricular wall thickness.  No thrombi or significant valvular abnormalities appreciated.  Antimicrobials:  Anti-infectives (From admission, onward)   None      Subjective: Afebrile no chest pain, no headaches, no shortness of breath, no nausea vomiting.  Patient still with left hemianopsia, left  side weakness and is slow to response.  No difficulty swallowing and no slurred  speech.  Objective: Vitals:   03/02/19 0414 03/02/19 0921 03/02/19 1411 03/02/19 1500  BP: (!) 186/81 (!) 183/70 (!) 165/74 (!) 156/68  Pulse: 78 67 82   Resp: (!) 22 19 18 18   Temp: 97.9 F (36.6 C) 97.6 F (36.4 C) 98.3 F (36.8 C) 98.4 F (36.9 C)  TempSrc: Axillary Oral Oral Oral  SpO2: 98% 97% 100% 97%  Weight:      Height:        Intake/Output Summary (Last 24 hours) at 03/02/2019 1707 Last data filed at 03/02/2019 1000 Gross per 24 hour  Intake 2308.91 ml  Output 750 ml  Net 1558.91 ml   Filed Weights   03/01/19 0251  Weight: 74.4 kg    Examination: General exam: Alert, awake, oriented x 3; slightly demonstrating slow response but appropriate.  Left side weakness appreciated (more her left lower extremity than upper extremity); patient with left hemianopsia.  No slurred speech.  Denies headache. Respiratory system: Clear to auscultation. Respiratory effort normal. Cardiovascular system:RRR. No murmurs, rubs, gallops. Gastrointestinal system: Abdomen is nondistended, soft and nontender. No organomegaly or masses felt. Normal bowel sounds heard. Central nervous system: Oriented x3, positive left-sided weakness (more lower extremity than upper extremity); no pronation drift.  Positive left hemianopsia.  Slow to response. Extremities: No C/C/E, +pedal pulses Skin: No rashes, lesions or ulcers Psychiatry: Judgement and insight appear normal. Mood & affect appropriate.     Data Reviewed: I have personally reviewed following labs and imaging studies  CBC: Recent Labs  Lab 02/28/19 2210 03/01/19 0247  WBC 13.2* 12.8*  NEUTROABS 10.7*  --   HGB 14.3 13.8  HCT 43.4 41.5  MCV 85.8 84.5  PLT 288 291   Basic Metabolic Panel: Recent Labs  Lab 02/28/19 2210 03/01/19 0247  NA 141 139  K 3.9 3.9  CL 106 99  CO2 18* 20*  GLUCOSE 125* 106*  BUN 22 21  CREATININE 1.20* 1.10*  CALCIUM 9.3 9.3   GFR: Estimated Creatinine Clearance: 40.4 mL/min (A) (by C-G  formula based on SCr of 1.1 mg/dL (H)).   Liver Function Tests: Recent Labs  Lab 02/28/19 2210 03/01/19 0247  AST 34 29  ALT 20 19  ALKPHOS 96 90  BILITOT 1.1 1.2  PROT 6.1* 5.7*  ALBUMIN 3.7 3.5   Cardiac Enzymes: Recent Labs  Lab 02/28/19 2210  CKTOTAL 364*  TROPONINI <0.03   HbA1C: Recent Labs    03/01/19 0247  HGBA1C 5.6   CBG: Recent Labs  Lab 03/01/19 1701 03/01/19 2122 03/02/19 0826 03/02/19 1213 03/02/19 1643  GLUCAP 141* 143* 110* 266* 113*   Lipid Profile: Recent Labs    03/01/19 0247  CHOL 123  HDL 70  LDLCALC 37  TRIG 82  CHOLHDL 1.8   Urine analysis:    Component Value Date/Time   COLORURINE YELLOW 12/22/2018 1215   APPEARANCEUR HAZY (A) 12/22/2018 1215   LABSPEC 1.005 12/22/2018 1215   PHURINE 6.0 12/22/2018 1215   GLUCOSEU NEGATIVE 12/22/2018 1215   HGBUR NEGATIVE 12/22/2018 1215   BILIRUBINUR NEGATIVE 12/22/2018 1215   KETONESUR NEGATIVE 12/22/2018 1215   PROTEINUR NEGATIVE 12/22/2018 1215   UROBILINOGEN 0.2 11/19/2009 1055   NITRITE NEGATIVE 12/22/2018 1215   LEUKOCYTESUR NEGATIVE 12/22/2018 1215    Recent Results (from the past 240 hour(s))  SARS Coronavirus 2 (CEPHEID - Performed in [facility] hospital lab), [facility]  Status: None   Collection Time: 02/28/19 11:38 PM  Result Value Ref Range Status   SARS Coronavirus 2 NEGATIVE NEGATIVE Final    Comment: (NOTE) If result is NEGATIVE SARS-CoV-2 target nucleic acids are NOT DETECTED. The SARS-CoV-2 RNA is generally detectable in upper and lower  respiratory specimens during the acute phase of infection. The lowest  concentration of SARS-CoV-2 viral copies this assay can detect is 250  copies / mL. A negative result does not preclude SARS-CoV-2 infection  and should not be used as the sole basis for treatment or other  patient management decisions.  A negative result may occur with  improper specimen collection / handling, submission of specimen other  than  nasopharyngeal swab, presence of viral mutation(s) within the  areas targeted by this assay, and inadequate number of viral copies  (<250 copies / mL). A negative result must be combined with clinical  observations, patient history, and epidemiological information. If result is POSITIVE SARS-CoV-2 target nucleic acids are DETECTED. The SARS-CoV-2 RNA is generally detectable in upper and lower  respiratory specimens dur ing the acute phase of infection.  Positive  results are indicative of active infection with SARS-CoV-2.  Clinical  correlation with patient history and other diagnostic information is  necessary to determine patient infection status.  Positive results do  not rule out bacterial infection or co-infection with other viruses. If result is PRESUMPTIVE POSTIVE SARS-CoV-2 nucleic acids MAY BE PRESENT.   A presumptive positive result was obtained on the submitted specimen  and confirmed on repeat testing.  While 2019 novel coronavirus  (SARS-CoV-2) nucleic acids may be present in the submitted sample  additional confirmatory testing may be necessary for epidemiological  and / or clinical management purposes  to differentiate between  SARS-CoV-2 and other Sarbecovirus currently known to infect humans.  If clinically indicated additional testing with an alternate test  methodology [phone]) is advised. The SARS-CoV-2 RNA is generally  detectable in upper and lower respiratory sp ecimens during the acute  phase of infection. The expected result is Negative. Fact Sheet for Patients:  BoilerBrush.com.cy Fact Sheet for Healthcare Providers: https://pope.com/ This test is not yet approved or cleared by the [place] FDA and has been authorized for detection and/or diagnosis of SARS-CoV-2 by FDA under an Emergency Use Authorization (EUA).  This EUA will remain in effect (meaning this test can be used) for the duration of  the COVID-19 declaration under Section 564(b)(1) of the Act, 21 U.S.C. section 360bbb-3(b)(1), unless the authorization is terminated or revoked sooner. Performed at [facility], [address], [place], [place] [address]     Radiology Studies: Ct Angio Head W Or Wo Contrast  Result Date: 03/01/2019 CLINICAL DATA:  Initial evaluation for acute altered mental status, possible visual field cut. EXAM: CT ANGIOGRAPHY HEAD AND NECK CT PERFUSION BRAIN TECHNIQUE: Multidetector CT imaging of the head and neck was performed using the standard protocol during bolus administration of intravenous contrast. Multiplanar CT image reconstructions and MIPs were obtained to evaluate the vascular anatomy. Carotid stenosis measurements (when applicable) are obtained utilizing NASCET criteria, using the distal internal carotid diameter as the denominator. Multiphase CT imaging of the brain was performed following IV bolus contrast injection. Subsequent parametric perfusion maps were calculated using RAPID software. CONTRAST:  OMNIPAQUE IOHEXOL 350 MG/ML SOLN COMPARISON:  Prior CT from earlier the same evening. FINDINGS: CT HEAD FINDINGS Brain: Generalized age-related cerebral atrophy with moderate chronic small vessel ischemic disease. Multiple chronic lacunar infarcts  noted within the bilateral basal ganglia and right thalamus. Extremely subtle loss of gray-white matter differentiation with evolving hypodensity seen at the posterior right temporal region (series 5, image 16), suspicious for of all vein posterior right MCA territory infarct. This correlates with area of ischemic penumbra on concomitant perfusion exam. Otherwise, gray-white matter differentiation maintained with no other acute large vessel territory infarct. No acute intracranial hemorrhage. No mass lesion, midline shift or mass effect. No hydrocephalus. No extra-axial fluid collection. Vascular: Single focal hyperdensity involving a proximal  right M2 branch, favored to reflect focal atherosclerotic plaque rather than thrombus (series 8, image 20). No other hyperdense vessel. Scattered vascular calcifications noted within the carotid siphons. Skull: Scalp soft tissues and calvarium within normal limits. Sinuses/Orbits: Globes and orbital soft tissues within normal limits. Paranasal sinuses are largely clear. No mastoid effusion. Other: None. ASPECTS (Alberta Stroke Program Early CT Score) - Ganglionic level infarction (caudate, lentiform nuclei, internal capsule, insula, M1-M3 cortex): 6 - Supraganglionic infarction (M4-M6 cortex): 3 Total score (0-10 with 10 being normal): 9 Review of the MIP images confirms the above findings CTA NECK FINDINGS Aortic arch: Visualized aortic arch of normal caliber with normal 3 vessel morphology. Mild scattered atheromatous plaque about the arch and origin of the great vessels without hemodynamically significant stenosis. Visualized subclavian arteries widely patent. Right carotid system: Right common carotid artery patent from its origin to the bifurcation without stenosis. Mixed plaque about the right bifurcation/proximal right ICA without hemodynamically significant stenosis. Right ICA widely patent distally to the skull base without stenosis, dissection or occlusion. Left carotid system: Left common carotid artery patent from its origin to the bifurcation without stenosis. No significant atheromatous change or narrowing about the left bifurcation. Left ICA widely patent distally to the skull base without stenosis, dissection, or occlusion. Vertebral arteries: Both of the vertebral arteries arise from the subclavian arteries. Vertebral arteries widely patent within the neck without stenosis, dissection or occlusion. Right vertebral artery slightly dominant. Skeleton: No acute osseous finding. No discrete worrisome lytic or blastic osseous lesions. Eagle syndrome noted. Other neck: No other acute soft tissue  abnormality about the neck. Scattered calcified sialoliths with multifocal stones at the right floor of mouth. Postsurgical changes noted at the left thyroid lobe. Upper chest: Visualized upper chest demonstrates no acute finding. Review of the MIP images confirms the above findings CTA HEAD FINDINGS Anterior circulation: Petrous segments patent bilaterally. Mild atheromatous plaque within the carotid siphons without hemodynamically significant stenosis. A1 segments widely patent. Normal anterior communicating artery. Anterior cerebral arteries patent to their distal aspects without stenosis. Left M1 widely patent. Normal left MCA bifurcation. Distal left MCA branches well perfused. Right M1 widely patent. Normal right MCA bifurcation. Abrupt cut off of a proximal right M2 branch inferior division, favored to reflect a severe short-segment stenosis (series 11, image 97). Flow is seen distally, although is fairly attenuated. Superior division remains patent as does its distal branches. Posterior circulation: Vertebral arteries patent to the vertebrobasilar junction without stenosis. Posterior inferior cerebral arteries not well assessed on this examination. Basilar artery mildly diminutive but patent to its distal aspect without stenosis. Superior cerebral arteries patent bilaterally. Left PCA arises from the basilar. Fetal type origin of the right PCA. PCAs widely patent to their distal aspects without stenosis. Venous sinuses: Grossly patent allowing for timing of the contrast bolus. Anatomic variants: Fetal type right PCA. Delayed phase: Not performed. Review of the MIP images confirms the above findings CT Brain Perfusion Findings: ASPECTS: 9 CBF (<  30%) Volume: 0mL Perfusion (Tmax>6.0s) volume: 18mL Mismatch Volume: 18mL Infarction Location:Negative CT perfusion for core infarct. 18 cc ischemic penumbra at the posterior right temporal lobe, posterior right MCA distribution. IMPRESSION: 1. Abrupt cut off of a  proximal right M2 branch, inferior division, favored to reflect a short-segment severe high-grade stenosis. Flow is seen distally within the right MCA branches, although is attenuated and reduced. 2. Focal 18 cc ischemic penumbra at the posterior right temporal lobe, corresponding with the right M2 severe stenosis. Small occult core infarct in this region is suspected given the subtle early changes on the noncontrast head CT portion of this exam. 3. Mild atherosclerotic change about the aortic arch, right carotid bifurcation, and carotid siphons without hemodynamically significant stenosis. Critical Value/emergent results were called by telephone at the time of interpretation on 03/01/2019 at 1:26 am to Dr. Ritta Slot , who verbally acknowledged these results. Electronically Signed   By: Rise Mu M.D.   On: 03/01/2019 01:56   Dg Pelvis 1-2 Views  Result Date: 03/01/2019 CLINICAL DATA:  Recent fall. Unable to inverted left foot. Question stroke. EXAM: PELVIS - 1-2 VIEW COMPARISON:  None FINDINGS: Contrast fills the urinary bladder.  Bladder is unremarkable. Pelvis is intact.  Lower lumbar spine is within. Lucency is noted along the greater trochanter. This may represent an avulsion fracture. IMPRESSION: 1. Possible avulsion fracture along the greater trochanter. Recommend dedicated radiographs of the left hip further evaluation. 2. The pelvis is otherwise unremarkable. Electronically Signed   By: Marin Roberts M.D.   On: 03/01/2019 08:37   Ct Head Wo Contrast  Result Date: 03/01/2019 CLINICAL DATA:  Stroke follow-up.  Change in mental status. EXAM: CT HEAD WITHOUT CONTRAST TECHNIQUE: Contiguous axial images were obtained from the base of the skull through the vertex without intravenous contrast. COMPARISON:  03/01/2019 FINDINGS: Brain: Unchanged does appearance of multifocal chronic ischemic change, predominantly within the right hemisphere. Unchanged old right caudothalamic small  vessel infarct. No intracranial hemorrhage. No midline shift or other mass effect. Unchanged size and configuration of ventricles. Vascular: No abnormal hyperdensity of the major intracranial arteries or dural venous sinuses. No intracranial atherosclerosis. Skull: The visualized skull base, calvarium and extracranial soft tissues are normal. Sinuses/Orbits: No fluid levels or advanced mucosal thickening of the visualized paranasal sinuses. No mastoid or middle ear effusion. The orbits are normal. IMPRESSION: Unchanged examination with findings of chronic small vessel ischemia. No acute abnormality. Electronically Signed   By: Deatra Robinson M.D.   On: 03/01/2019 22:33   Ct Head Wo Contrast  Result Date: 02/28/2019 CLINICAL DATA:  Fall with hematoma to left side of head EXAM: CT HEAD WITHOUT CONTRAST CT CERVICAL SPINE WITHOUT CONTRAST TECHNIQUE: Multidetector CT imaging of the head and cervical spine was performed following the standard protocol without intravenous contrast. Multiplanar CT image reconstructions of the cervical spine were also generated. COMPARISON:  MRI 12/19/2018, CT brain 12/19/2018, MRI 04/17/2015 FINDINGS: CT HEAD FINDINGS Brain: No acute territorial infarction, hemorrhage or intracranial mass. Mild atrophy. Moderate small vessel ischemic changes of the white matter. 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 Stable ventricle size. Vascular: No hyperdense vessels.  Carotid vascular calcification Skull: Normal. Negative for fracture or focal lesion. Sinuses/Orbits: No acute finding. Other: None CT CERVICAL SPINE FINDINGS Alignment: No subluxation.  Facet alignment within normal limits Skull base and vertebrae: No acute fracture. No primary bone lesion or focal pathologic process. Soft tissues and spinal canal: No prevertebral  fluid or swelling. No visible canal hematoma. Disc levels: Moderate degenerative changes at C4-C5, C5-C6 and C6-C7.  Posterior disc osteophyte complex at multiple levels. Upper chest: Negative.  Mild carotid vascular calcification Other: None IMPRESSION: 1. No CT evidence for acute intracranial abnormality. Atrophy and small vessel ischemic changes of the white matter. 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 Degenerative changes of the cervical spine. No acute osseous abnormality. Electronically Signed   By: Jasmine Pang M.D.   On: 02/28/2019 23:16   Ct Angio Neck W Or Wo Contrast  Result Date: 03/01/2019 CLINICAL DATA:  Initial evaluation for acute altered mental status, possible visual field cut. EXAM: CT ANGIOGRAPHY HEAD AND NECK CT PERFUSION BRAIN TECHNIQUE: Multidetector CT imaging of the head and neck was performed using the standard protocol during bolus administration of intravenous contrast. Multiplanar CT image reconstructions and MIPs were obtained to evaluate the vascular anatomy. Carotid stenosis measurements (when applicable) are obtained utilizing NASCET criteria, using the distal internal carotid diameter as the denominator. Multiphase CT imaging of the brain was performed following IV bolus contrast injection. Subsequent parametric perfusion maps were calculated using RAPID software. CONTRAST:  OMNIPAQUE IOHEXOL 350 MG/ML SOLN COMPARISON:  Prior CT from earlier the same evening. FINDINGS: CT HEAD FINDINGS Brain: Generalized age-related cerebral atrophy with moderate chronic small vessel ischemic disease. Multiple chronic lacunar infarcts noted within the bilateral basal ganglia and right thalamus. Extremely subtle loss of gray-white matter differentiation with evolving hypodensity seen at the posterior right temporal region (series 5, image 16), suspicious for of all vein posterior right MCA territory infarct. This correlates with area of ischemic penumbra on concomitant perfusion exam. Otherwise, gray-white matter differentiation maintained with no other acute large  vessel territory infarct. No acute intracranial hemorrhage. No mass lesion, midline shift or mass effect. No hydrocephalus. No extra-axial fluid collection. Vascular: Single focal hyperdensity involving a proximal right M2 branch, favored to reflect focal atherosclerotic plaque rather than thrombus (series 8, image 20). No other hyperdense vessel. Scattered vascular calcifications noted within the carotid siphons. Skull: Scalp soft tissues and calvarium within normal limits. Sinuses/Orbits: Globes and orbital soft tissues within normal limits. Paranasal sinuses are largely clear. No mastoid effusion. Other: None. ASPECTS (Alberta Stroke Program Early CT Score) - Ganglionic level infarction (caudate, lentiform nuclei, internal capsule, insula, M1-M3 cortex): 6 - Supraganglionic infarction (M4-M6 cortex): 3 Total score (0-10 with 10 being normal): 9 Review of the MIP images confirms the above findings CTA NECK FINDINGS Aortic arch: Visualized aortic arch of normal caliber with normal 3 vessel morphology. Mild scattered atheromatous plaque about the arch and origin of the great vessels without hemodynamically significant stenosis. Visualized subclavian arteries widely patent. Right carotid system: Right common carotid artery patent from its origin to the bifurcation without stenosis. Mixed plaque about the right bifurcation/proximal right ICA without hemodynamically significant stenosis. Right ICA widely patent distally to the skull base without stenosis, dissection or occlusion. Left carotid system: Left common carotid artery patent from its origin to the bifurcation without stenosis. No significant atheromatous change or narrowing about the left bifurcation. Left ICA widely patent distally to the skull base without stenosis, dissection, or occlusion. Vertebral arteries: Both of the vertebral arteries arise from the subclavian arteries. Vertebral arteries widely patent within the neck without stenosis, dissection or  occlusion. Right vertebral artery slightly dominant. Skeleton: No acute osseous finding. No discrete worrisome lytic or blastic osseous lesions. Eagle syndrome noted. Other neck: No other acute soft tissue abnormality  about the neck. Scattered calcified sialoliths with multifocal stones at the right floor of mouth. Postsurgical changes noted at the left thyroid lobe. Upper chest: Visualized upper chest demonstrates no acute finding. Review of the MIP images confirms the above findings CTA HEAD FINDINGS Anterior circulation: Petrous segments patent bilaterally. Mild atheromatous plaque within the carotid siphons without hemodynamically significant stenosis. A1 segments widely patent. Normal anterior communicating artery. Anterior cerebral arteries patent to their distal aspects without stenosis. Left M1 widely patent. Normal left MCA bifurcation. Distal left MCA branches well perfused. Right M1 widely patent. Normal right MCA bifurcation. Abrupt cut off of a proximal right M2 branch inferior division, favored to reflect a severe short-segment stenosis (series 11, image 97). Flow is seen distally, although is fairly attenuated. Superior division remains patent as does its distal branches. Posterior circulation: Vertebral arteries patent to the vertebrobasilar junction without stenosis. Posterior inferior cerebral arteries not well assessed on this examination. Basilar artery mildly diminutive but patent to its distal aspect without stenosis. Superior cerebral arteries patent bilaterally. Left PCA arises from the basilar. Fetal type origin of the right PCA. PCAs widely patent to their distal aspects without stenosis. Venous sinuses: Grossly patent allowing for timing of the contrast bolus. Anatomic variants: Fetal type right PCA. Delayed phase: Not performed. Review of the MIP images confirms the above findings CT Brain Perfusion Findings: ASPECTS: 9 CBF (<30%) Volume: 0mL Perfusion (Tmax>6.0s) volume: 18mL Mismatch  Volume: 18mL Infarction Location:Negative CT perfusion for core infarct. 18 cc ischemic penumbra at the posterior right temporal lobe, posterior right MCA distribution. IMPRESSION: 1. Abrupt cut off of a proximal right M2 branch, inferior division, favored to reflect a short-segment severe high-grade stenosis. Flow is seen distally within the right MCA branches, although is attenuated and reduced. 2. Focal 18 cc ischemic penumbra at the posterior right temporal lobe, corresponding with the right M2 severe stenosis. Small occult core infarct in this region is suspected given the subtle early changes on the noncontrast head CT portion of this exam. 3. Mild atherosclerotic change about the aortic arch, right carotid bifurcation, and carotid siphons without hemodynamically significant stenosis. Critical Value/emergent results were called by telephone at the time of interpretation on 03/01/2019 at 1:26 am to Dr. Ritta Slot , who verbally acknowledged these results. Electronically Signed   By: Rise Mu M.D.   On: 03/01/2019 01:56   Ct Cervical Spine Wo Contrast  Result Date: 02/28/2019 CLINICAL DATA:  Fall with hematoma to left side of head EXAM: CT HEAD WITHOUT CONTRAST CT CERVICAL SPINE WITHOUT CONTRAST TECHNIQUE: Multidetector CT imaging of the head and cervical spine was performed following the standard protocol without intravenous contrast. Multiplanar CT image reconstructions of the cervical spine were also generated. COMPARISON:  MRI 12/19/2018, CT brain 12/19/2018, MRI 04/17/2015 FINDINGS: CT HEAD FINDINGS Brain: No acute territorial infarction, hemorrhage or intracranial mass. Mild atrophy. Moderate small vessel ischemic changes of the white matter. 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 Stable ventricle size. Vascular: No hyperdense vessels.  Carotid vascular calcification Skull: Normal. Negative for fracture or focal lesion.  Sinuses/Orbits: No acute finding. Other: None CT CERVICAL SPINE FINDINGS Alignment: No subluxation.  Facet alignment within normal limits Skull base and vertebrae: No acute fracture. No primary bone lesion or focal pathologic process. Soft tissues and spinal canal: No prevertebral fluid or swelling. No visible canal hematoma. Disc levels: Moderate degenerative changes at C4-C5, C5-C6 and C6-C7. Posterior disc osteophyte complex at multiple levels.  Upper chest: Negative.  Mild carotid vascular calcification Other: None IMPRESSION: 1. No CT evidence for acute intracranial abnormality. Atrophy and small vessel ischemic changes of the white matter. 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 Degenerative changes of the cervical spine. No acute osseous abnormality. Electronically Signed   By: Jasmine Pang M.D.   On: 02/28/2019 23:16   Mr Brain Wo Contrast  Result Date: 03/01/2019 CLINICAL DATA:  Stroke. History of diabetes, hyperlipidemia, hypertension. History of multiple sclerosis. EXAM: MRI HEAD WITHOUT CONTRAST TECHNIQUE: Multiplanar, multiecho pulse sequences of the brain and surrounding structures were obtained without intravenous contrast. COMPARISON:  CTA 03/01/2019.  MRI head 12/19/2018 FINDINGS: Brain: Acute infarct in the right posterior temporoparietal lobe. Multiple small patchy areas of restricted diffusion are seen in this territory. Occlusion of right M2 branch noted on CTA. Small areas of acute or subacute infarct in the left posterior parietal lobe and left frontal lobe over the convexity. Ventricle size is normal. Mild atrophy. Extensive changes throughout the periventricular deep white matter bilaterally. Chronic lacunar infarctions in the basal ganglia bilaterally brainstem and cerebellum intact. Several areas of chronic microhemorrhage in the brain. Negative for mass or edema. Vascular: Normal arterial flow voids Skull and upper cervical spine: Negative  Sinuses/Orbits: Negative Other: None IMPRESSION: Acute infarct right MCA territory corresponding to right M2 occlusion on CTA. This involves the posterior temporoparietal lobe. Small areas of recent infarct in the left frontal parietal lobe likely subacute nature. Possible emboli. Advanced chronic white matter changes. This is likely due to a combination of chronic microvascular ischemia and multiple sclerosis. Electronically Signed   By: Marlan Palau M.D.   On: 03/01/2019 08:04   Ct Cerebral Perfusion W Contrast  Result Date: 03/01/2019 CLINICAL DATA:  Initial evaluation for acute altered mental status, possible visual field cut. EXAM: CT ANGIOGRAPHY HEAD AND NECK CT PERFUSION BRAIN TECHNIQUE: Multidetector CT imaging of the head and neck was performed using the standard protocol during bolus administration of intravenous contrast. Multiplanar CT image reconstructions and MIPs were obtained to evaluate the vascular anatomy. Carotid stenosis measurements (when applicable) are obtained utilizing NASCET criteria, using the distal internal carotid diameter as the denominator. Multiphase CT imaging of the brain was performed following IV bolus contrast injection. Subsequent parametric perfusion maps were calculated using RAPID software. CONTRAST:  OMNIPAQUE IOHEXOL 350 MG/ML SOLN COMPARISON:  Prior CT from earlier the same evening. FINDINGS: CT HEAD FINDINGS Brain: Generalized age-related cerebral atrophy with moderate chronic small vessel ischemic disease. Multiple chronic lacunar infarcts noted within the bilateral basal ganglia and right thalamus. Extremely subtle loss of gray-white matter differentiation with evolving hypodensity seen at the posterior right temporal region (series 5, image 16), suspicious for of all vein posterior right MCA territory infarct. This correlates with area of ischemic penumbra on concomitant perfusion exam. Otherwise, gray-white matter differentiation maintained with no other  acute large vessel territory infarct. No acute intracranial hemorrhage. No mass lesion, midline shift or mass effect. No hydrocephalus. No extra-axial fluid collection. Vascular: Single focal hyperdensity involving a proximal right M2 branch, favored to reflect focal atherosclerotic plaque rather than thrombus (series 8, image 20). No other hyperdense vessel. Scattered vascular calcifications noted within the carotid siphons. Skull: Scalp soft tissues and calvarium within normal limits. Sinuses/Orbits: Globes and orbital soft tissues within normal limits. Paranasal sinuses are largely clear. No mastoid effusion. Other: None. ASPECTS [employer] Stroke Program Early CT Score) - Ganglionic level infarction (caudate, lentiform nuclei, internal capsule, insula, M1-M3  cortex): 6 - Supraganglionic infarction (M4-M6 cortex): 3 Total score (0-10 with 10 being normal): 9 Review of the MIP images confirms the above findings CTA NECK FINDINGS Aortic arch: Visualized aortic arch of normal caliber with normal 3 vessel morphology. Mild scattered atheromatous plaque about the arch and origin of the great vessels without hemodynamically significant stenosis. Visualized subclavian arteries widely patent. Right carotid system: Right common carotid artery patent from its origin to the bifurcation without stenosis. Mixed plaque about the right bifurcation/proximal right ICA without hemodynamically significant stenosis. Right ICA widely patent distally to the skull base without stenosis, dissection or occlusion. Left carotid system: Left common carotid artery patent from its origin to the bifurcation without stenosis. No significant atheromatous change or narrowing about the left bifurcation. Left ICA widely patent distally to the skull base without stenosis, dissection, or occlusion. Vertebral arteries: Both of the vertebral arteries arise from the subclavian arteries. Vertebral arteries widely patent within the neck without stenosis,  dissection or occlusion. Right vertebral artery slightly dominant. Skeleton: No acute osseous finding. No discrete worrisome lytic or blastic osseous lesions. Eagle syndrome noted. Other neck: No other acute soft tissue abnormality about the neck. Scattered calcified sialoliths with multifocal stones at the right floor of mouth. Postsurgical changes noted at the left thyroid lobe. Upper chest: Visualized upper chest demonstrates no acute finding. Review of the MIP images confirms the above findings CTA HEAD FINDINGS Anterior circulation: Petrous segments patent bilaterally. Mild atheromatous plaque within the carotid siphons without hemodynamically significant stenosis. A1 segments widely patent. Normal anterior communicating artery. Anterior cerebral arteries patent to their distal aspects without stenosis. Left M1 widely patent. Normal left MCA bifurcation. Distal left MCA branches well perfused. Right M1 widely patent. Normal right MCA bifurcation. Abrupt cut off of a proximal right M2 branch inferior division, favored to reflect a severe short-segment stenosis (series 11, image 97). Flow is seen distally, although is fairly attenuated. Superior division remains patent as does its distal branches. Posterior circulation: Vertebral arteries patent to the vertebrobasilar junction without stenosis. Posterior inferior cerebral arteries not well assessed on this examination. Basilar artery mildly diminutive but patent to its distal aspect without stenosis. Superior cerebral arteries patent bilaterally. Left PCA arises from the basilar. Fetal type origin of the right PCA. PCAs widely patent to their distal aspects without stenosis. Venous sinuses: Grossly patent allowing for timing of the contrast bolus. Anatomic variants: Fetal type right PCA. Delayed phase: Not performed. Review of the MIP images confirms the above findings CT Brain Perfusion Findings: ASPECTS: 9 CBF (<30%) Volume: 0mL Perfusion (Tmax>6.0s) volume:  18mL Mismatch Volume: 18mL Infarction Location:Negative CT perfusion for core infarct. 18 cc ischemic penumbra at the posterior right temporal lobe, posterior right MCA distribution. IMPRESSION: 1. Abrupt cut off of a proximal right M2 branch, inferior division, favored to reflect a short-segment severe high-grade stenosis. Flow is seen distally within the right MCA branches, although is attenuated and reduced. 2. Focal 18 cc ischemic penumbra at the posterior right temporal lobe, corresponding with the right M2 severe stenosis. Small occult core infarct in this region is suspected given the subtle early changes on the noncontrast head CT portion of this exam. 3. Mild atherosclerotic change about the aortic arch, right carotid bifurcation, and carotid siphons without hemodynamically significant stenosis. Critical Value/emergent results were called by telephone at the time of interpretation on 03/01/2019 at 1:26 am to Dr. Ritta Slot , who verbally acknowledged these results. Electronically Signed   By: Janell Quiet.D.  On: 03/01/2019 01:56   Dg Chest Portable 1 View  Result Date: 02/28/2019 CLINICAL DATA:  Altered mental status EXAM: PORTABLE CHEST 1 VIEW COMPARISON:  12/19/2018 FINDINGS: No acute opacity or pleural effusion. Mild scarring at the left base. Normal heart size. No pneumothorax. IMPRESSION: No active disease. Electronically Signed   By: Jasmine Pang M.D.   On: 02/28/2019 22:52   Vas [place] Lower Extremity Venous (dvt)  Result Date: 03/02/2019  Lower Venous Study Indications: Stroke.  Comparison Study: No prior study on file for comparison. Performing Technologist: Sherren Kerns RVS  Examination Guidelines: A complete evaluation includes B-mode imaging, spectral Doppler, color Doppler, and power Doppler as needed of all accessible portions of each vessel. Bilateral testing is considered an integral part of a complete examination. Limited examinations for reoccurring indications  may be performed as noted.  +---------+---------------+---------+-----------+----------+-------+  RIGHT     Compressibility Phasicity Spontaneity Properties Summary  +---------+---------------+---------+-----------+----------+-------+  CFV       Full            Yes       Yes                             +---------+---------------+---------+-----------+----------+-------+  SFJ       Full                                                      +---------+---------------+---------+-----------+----------+-------+  FV Prox   Full                                                      +---------+---------------+---------+-----------+----------+-------+  FV Mid    Full                                                      +---------+---------------+---------+-----------+----------+-------+  FV Distal Full                                                      +---------+---------------+---------+-----------+----------+-------+  PFV       Full                                                      +---------+---------------+---------+-----------+----------+-------+  POP       Full            Yes       Yes                             +---------+---------------+---------+-----------+----------+-------+  PTV       Full                                                      +---------+---------------+---------+-----------+----------+-------+  PERO      Full                                                      +---------+---------------+---------+-----------+----------+-------+   +---------+---------------+---------+-----------+----------+-------+  LEFT      Compressibility Phasicity Spontaneity Properties Summary  +---------+---------------+---------+-----------+----------+-------+  CFV       Full            Yes       Yes                             +---------+---------------+---------+-----------+----------+-------+  SFJ       Full                                                       +---------+---------------+---------+-----------+----------+-------+  FV Prox   Full                                                      +---------+---------------+---------+-----------+----------+-------+  FV Mid    Full                                                      +---------+---------------+---------+-----------+----------+-------+  FV Distal Full                                                      +---------+---------------+---------+-----------+----------+-------+  PFV       Full                                                      +---------+---------------+---------+-----------+----------+-------+  POP       Full            Yes       Yes                             +---------+---------------+---------+-----------+----------+-------+  PTV       Full                                                      +---------+---------------+---------+-----------+----------+-------+  PERO      Full                                                      +---------+---------------+---------+-----------+----------+-------+  Summary: Right: There is no evidence of deep vein thrombosis in the lower extremity. Left: There is no evidence of deep vein thrombosis in the lower extremity.  *See table(s) above for measurements and observations. Electronically signed by Sherald Hess MD on 03/02/2019 at 4:35:56 PM.    Final     Scheduled Meds:  aspirin  325 mg Oral Daily   clopidogrel  75 mg Oral Daily   enoxaparin (LOVENOX) injection  40 mg Subcutaneous Q24H   hydrALAZINE  50 mg Oral Q8H   insulin aspart  0-9 Units Subcutaneous TID WC   lisinopril  20 mg Oral Daily   Continuous Infusions:   LOS: 1 day    Time spent: 30 minutes.     Vassie Loll, MD [facility] Pager [phone]  03/02/2019, 5:07 PM

## 2019-03-03 DIAGNOSIS — N390 Urinary tract infection, site not specified: Secondary | ICD-10-CM

## 2019-03-03 DIAGNOSIS — I10 Essential (primary) hypertension: Secondary | ICD-10-CM

## 2019-03-03 LAB — URINALYSIS, ROUTINE W REFLEX MICROSCOPIC
Bilirubin Urine: NEGATIVE
Glucose, UA: NEGATIVE mg/dL
Ketones, ur: NEGATIVE mg/dL
Nitrite: POSITIVE — AB
Protein, ur: 30 mg/dL — AB
Specific Gravity, Urine: 1.019 (ref 1.005–1.030)
WBC, UA: >50 WBC/hpf — ABNORMAL HIGH (ref 0–5)
pH: 5 (ref 5.0–8.0)

## 2019-03-03 LAB — GLUCOSE, CAPILLARY
Glucose-Capillary: 142 mg/dL — ABNORMAL HIGH (ref 70–99)
Glucose-Capillary: 220 mg/dL — ABNORMAL HIGH (ref 70–99)
Glucose-Capillary: 139 mg/dL — ABNORMAL HIGH (ref 70–99)
Glucose-Capillary: 160 mg/dL — ABNORMAL HIGH (ref 70–99)

## 2019-03-03 LAB — RAPID URINE DRUG SCREEN, HOSP PERFORMED
Amphetamines: NOT DETECTED
Barbiturates: NOT DETECTED
Benzodiazepines: NOT DETECTED
Cocaine: NOT DETECTED
Opiates: NOT DETECTED
Tetrahydrocannabinol: NOT DETECTED

## 2019-03-03 MED ORDER — SODIUM CHLORIDE 0.9 % IV SOLN
1.0000 g | INTRAVENOUS | Status: DC
  Administered 2019-03-03 – 2019-03-06 (×4): 1 g via INTRAVENOUS
  Filled 2019-03-03 (×5): qty 10

## 2019-03-03 MED ORDER — ONDANSETRON HCL 4 MG/2ML IJ SOLN
4.0000 mg | Freq: Four times a day (QID) | INTRAMUSCULAR | Status: DC | PRN
  Administered 2019-03-03: 11:00:00 4 mg via INTRAVENOUS
  Filled 2019-03-03: qty 2

## 2019-03-03 NOTE — Progress Notes (Signed)
PROGRESS NOTE    Janice Little  ZOX:[id number] DOB: Jun 14, 1946 DOA: 02/28/2019 PCP: Juluis Rainier, MD     Brief Narrative:  As per H&P written by Dr. Toniann Fail on 03/01/2019 73 y.o. female with history of hypertension, diabetes mellitus type 2, hyperlipidemia and multiple sclerosis was last seen by family on Monday 4 days ago was not able to be reached and so patient family called for wellness check and patient was found to be on the floor in her room.  Patient appeared mildly confused but was following commands and was brought to the ER.  Patient states she has fallen on Monday 4 days ago and not sure how she fell.  States she has not taken her medications since her fall.  ED Course: In the ER on exam patient not able to move her eyes towards the left.  And also has neglect on the left side and has mild weakness of the left upper and lower extremity.  CT head was unremarkable.  On-call neurologist Dr. Amada Jupiter was consulted and patient admitted for acute CVA.  Blood pressure is markedly elevated given the acute CVA will allow for permissive hypertension.  Patient denies any chest pain shortness of breath or any pain around the extremities.  EKG shows normal sinus rhythm.  Labs show creatinine of 1.2 hemoglobin 14.3 WBC 13.2 platelets 288.  CT of the C-spine was unremarkable chest x-ray was unremarkable.  Assessment & Plan: 1-acute CVA (cerebrovascular accident) (HCC) -With MRI demonstrating a scatted distal right posterior temporoparietal MCA infarct; there is also surrounding small infarct seen in left posterior parietal/frontal that are less intense on DWI. -CTA head and neck: Demonstrating right ICA proximal and bilateral siphon atherosclerosis.  R M2 cutoff in inferior deviation appreciated. -At this moment following neurology recommendations will use aspirin and Plavix for 35-month with subsequent use of aspirin alone for secondary prevention. -Patient will require a loop  recorder to rule out arrhythmia. -Follow PT, OT and speech therapy recommendations (so far suggesting CIR). -LDL 37 -A1c 5.6  2-essential hypertension -Stable currently, permissive hypertension in the setting of acute ischemia -Will resume the use of hydralazine and lisinopril over the next 5 days -Follow vital signs.  3-history of multiple sclerosis/optic neuritis -Continue outpatient follow-up with Dr. Epimenio Foot at [facility]  4-upper lipidemia -LDL 37 -Will hold Lipitor for now given low LDL -LFTs within normal limits.  5-class I obesity -Low calorie diet, portion control and increase physical activity discussed with patient -Body mass index is 32.03 kg/m.  6-primary hyperparathyroidism -Status post surgery -Stable calcium level.  7-physical deconditioning/global left residual weakness -In the setting of residual deficit from stroke -Follow PT/OT recommendation -So far recommending CIR.  8-increase frequency and dysuria -Check urine culture -Urinalysis with positive nitrites and elevated leukocyte esterase -Empiric treatment using Rocephin has been initiated.  DVT prophylaxis: Lovenox Code Status: Full  Family Communication: no family at bedside. Disposition Plan: So far speech therapy recommending CIR; PT/OT pending evaluation.  Neurology has recommended continued use of aspirin and Plavix for 3 months with subsequent use of aspirin for secondary prevention.  Patient will need a loop recorder implantation.  Consultants:   Neurology service  Procedures:   See below for x-ray report  2D echo: Demonstrating preserved ejection fraction, impaired relaxation of ventricular walls suggesting diastolic dysfunction; no wall motion and normalities.  Mild increased left ventricular wall thickness.  No thrombi or significant valvular abnormalities appreciated.  Antimicrobials:  Anti-infectives (From admission, onward)   Start  Dose/Rate Route Frequency Ordered Stop   03/03/19  1000  cefTRIAXone (ROCEPHIN) 1 g in sodium chloride 0.9 % 100 mL IVPB     1 g 200 mL/hr over 30 Minutes Intravenous Every 24 hours 03/03/19 0956        Subjective: No fever, no chest pain, no headaches, no shortness of breath.  Patient denies nausea vomiting.  Complaining of increased frequency and dysuria.  No new focal neurologic deficits appreciated.  Objective: Vitals:   03/02/19 2030 03/03/19 0040 03/03/19 0418 03/03/19 0811  BP: (!) 171/68 (!) 145/64 (!) 165/68 (!) 166/80  Pulse: 89 93 90 99  Resp: Temp: 98.7 F (37.1 C) 98.4 F (36.9 C) 97.7 F (36.5 C) 97.8 F (36.6 C)  TempSrc: Oral Oral Oral Oral  SpO2: 95% 97% 94% 98%  Weight:      Height:        Intake/Output Summary (Last 24 hours) at 03/03/2019 0959 Last data filed at 03/03/2019 0011 Gross per 24 hour  Intake 120 ml  Output 950 ml  Net -830 ml   Filed Weights   03/01/19 0251  Weight: 74.4 kg    Examination: General exam: Alert, awake, oriented x 3; continue to demonstrate slow response, answering questions, but appropriate.  No nausea, no vomiting.  Patient reports increased frequency and burning sensation when she urinates. Respiratory system: Clear to auscultation. Respiratory effort normal. Cardiovascular system:RRR. No murmurs, rubs, gallops. Gastrointestinal system: Abdomen is nondistended, soft and nontender. No organomegaly or masses felt. Normal bowel sounds heard. Central nervous system: Alert and oriented. No New focal neurological deficits.  Continued to have left-sided weakness, left hemianopsia and residual expressive aphasia. Extremities: No C/C/E, +pedal pulses Skin: No rashes, lesions or ulcers Psychiatry: Judgement and insight appear normal. Mood & affect appropriate.    Data Reviewed: I have personally reviewed following labs and imaging studies  CBC: Recent Labs  Lab 02/28/19 2210 03/01/19 0247  WBC 13.2* 12.8*  NEUTROABS 10.7*  --   HGB 14.3 13.8  HCT 43.4 41.5   MCV 85.8 84.5  PLT 288 291   Basic Metabolic Panel: Recent Labs  Lab 02/28/19 2210 03/01/19 0247  NA 141 139  K 3.9 3.9  CL 106 99  CO2 18* 20*  GLUCOSE 125* 106*  BUN 22 21  CREATININE 1.20* 1.10*  CALCIUM 9.3 9.3   GFR: Estimated Creatinine Clearance: 40.4 mL/min (A) (by C-G formula based on SCr of 1.1 mg/dL (H)).   Liver Function Tests: Recent Labs  Lab 02/28/19 2210 03/01/19 0247  AST 34 29  ALT 20 19  ALKPHOS 96 90  BILITOT 1.1 1.2  PROT 6.1* 5.7*  ALBUMIN 3.7 3.5   Cardiac Enzymes: Recent Labs  Lab 02/28/19 2210  CKTOTAL 364*  TROPONINI <0.03   HbA1C: Recent Labs    03/01/19 0247  HGBA1C 5.6   CBG: Recent Labs  Lab 03/02/19 0826 03/02/19 1213 03/02/19 1643 03/02/19 2315 03/03/19 0826  GLUCAP 110* 266* 113* 154* 142*   Lipid Profile: Recent Labs    03/01/19 0247  CHOL 123  HDL 70  LDLCALC 37  TRIG 82  CHOLHDL 1.8   Urine analysis:    Component Value Date/Time   COLORURINE YELLOW 03/03/2019 0033   APPEARANCEUR CLOUDY (A) 03/03/2019 0033   LABSPEC 1.019 03/03/2019 0033   PHURINE 5.0 03/03/2019 0033   GLUCOSEU NEGATIVE 03/03/2019 0033   HGBUR SMALL (A) 03/03/2019 0033   BILIRUBINUR NEGATIVE 03/03/2019 0033   KETONESUR NEGATIVE  03/03/2019 0033   PROTEINUR 30 (A) 03/03/2019 0033   UROBILINOGEN 0.2 11/19/2009 1055   NITRITE POSITIVE (A) 03/03/2019 0033   LEUKOCYTESUR LARGE (A) 03/03/2019 0033    Recent Results (from the past 240 hour(s))  SARS Coronavirus 2 (CEPHEID - Performed in [facility] hospital lab), Hosp Order     Status: None   Collection Time: 02/28/19 11:38 PM  Result Value Ref Range Status   SARS Coronavirus 2 NEGATIVE NEGATIVE Final    Comment: (NOTE) If result is NEGATIVE SARS-CoV-2 target nucleic acids are NOT DETECTED. The SARS-CoV-2 RNA is generally detectable in upper and lower  respiratory specimens during the acute phase of infection. The lowest  concentration of SARS-CoV-2 viral copies this assay can  detect is 250  copies / mL. A negative result does not preclude SARS-CoV-2 infection  and should not be used as the sole basis for treatment or other  patient management decisions.  A negative result may occur with  improper specimen collection / handling, submission of specimen other  than nasopharyngeal swab, presence of viral mutation(s) within the  areas targeted by this assay, and inadequate number of viral copies  (<250 copies / mL). A negative result must be combined with clinical  observations, patient history, and epidemiological information. If result is POSITIVE SARS-CoV-2 target nucleic acids are DETECTED. The SARS-CoV-2 RNA is generally detectable in upper and lower  respiratory specimens dur ing the acute phase of infection.  Positive  results are indicative of active infection with SARS-CoV-2.  Clinical  correlation with patient history and other diagnostic information is  necessary to determine patient infection status.  Positive results do  not rule out bacterial infection or co-infection with other viruses. If result is PRESUMPTIVE POSTIVE SARS-CoV-2 nucleic acids MAY BE PRESENT.   A presumptive positive result was obtained on the submitted specimen  and confirmed on repeat testing.  While 2019 novel coronavirus  (SARS-CoV-2) nucleic acids may be present in the submitted sample  additional confirmatory testing may be necessary for epidemiological  and / or clinical management purposes  to differentiate between  SARS-CoV-2 and other Sarbecovirus currently known to infect humans.  If clinically indicated additional testing with an alternate test  methodology [phone]) is advised. The SARS-CoV-2 RNA is generally  detectable in upper and lower respiratory sp ecimens during the acute  phase of infection. The expected result is Negative. Fact Sheet for Patients:  BoilerBrush.com.cy Fact Sheet for Healthcare Providers:  https://pope.com/ This test is not yet approved or cleared by the [place] FDA and has been authorized for detection and/or diagnosis of SARS-CoV-2 by FDA under an Emergency Use Authorization (EUA).  This EUA will remain in effect (meaning this test can be used) for the duration of the COVID-19 declaration under Section 564(b)(1) of the Act, 21 U.S.C. section 360bbb-3(b)(1), unless the authorization is terminated or revoked sooner. Performed at [facility], [address], [place], [place] [address]     Radiology Studies: Ct Head Wo Contrast  Result Date: 03/01/2019 CLINICAL DATA:  Stroke follow-up.  Change in mental status. EXAM: CT HEAD WITHOUT CONTRAST TECHNIQUE: Contiguous axial images were obtained from the base of the skull through the vertex without intravenous contrast. COMPARISON:  03/01/2019 FINDINGS: Brain: Unchanged does appearance of multifocal chronic ischemic change, predominantly within the right hemisphere. Unchanged old right caudothalamic small vessel infarct. No intracranial hemorrhage. No midline shift or other mass effect. Unchanged size and configuration of ventricles. Vascular: No abnormal hyperdensity of the major  intracranial arteries or dural venous sinuses. No intracranial atherosclerosis. Skull: The visualized skull base, calvarium and extracranial soft tissues are normal. Sinuses/Orbits: No fluid levels or advanced mucosal thickening of the visualized paranasal sinuses. No mastoid or middle ear effusion. The orbits are normal. IMPRESSION: Unchanged examination with findings of chronic small vessel ischemia. No acute abnormality. Electronically Signed   By: Deatra Robinson M.D.   On: 03/01/2019 22:33   Vas [place] Lower Extremity Venous (dvt)  Result Date: 03/02/2019  Lower Venous Study Indications: Stroke.  Comparison Study: No prior study on file for comparison. Performing Technologist: Sherren Kerns RVS  Examination Guidelines: A  complete evaluation includes B-mode imaging, spectral Doppler, color Doppler, and power Doppler as needed of all accessible portions of each vessel. Bilateral testing is considered an integral part of a complete examination. Limited examinations for reoccurring indications may be performed as noted.  +---------+---------------+---------+-----------+----------+-------+ RIGHT    CompressibilityPhasicitySpontaneityPropertiesSummary +---------+---------------+---------+-----------+----------+-------+ CFV      Full           Yes      Yes                          +---------+---------------+---------+-----------+----------+-------+ SFJ      Full                                                 +---------+---------------+---------+-----------+----------+-------+ FV Prox  Full                                                 +---------+---------------+---------+-----------+----------+-------+ FV Mid   Full                                                 +---------+---------------+---------+-----------+----------+-------+ FV DistalFull                                                 +---------+---------------+---------+-----------+----------+-------+ PFV      Full                                                 +---------+---------------+---------+-----------+----------+-------+ POP      Full           Yes      Yes                          +---------+---------------+---------+-----------+----------+-------+ PTV      Full                                                 +---------+---------------+---------+-----------+----------+-------+ PERO     Full                                                 +---------+---------------+---------+-----------+----------+-------+   +---------+---------------+---------+-----------+----------+-------+  LEFT     CompressibilityPhasicitySpontaneityPropertiesSummary  +---------+---------------+---------+-----------+----------+-------+ CFV      Full           Yes      Yes                          +---------+---------------+---------+-----------+----------+-------+ SFJ      Full                                                 +---------+---------------+---------+-----------+----------+-------+ FV Prox  Full                                                 +---------+---------------+---------+-----------+----------+-------+ FV Mid   Full                                                 +---------+---------------+---------+-----------+----------+-------+ FV DistalFull                                                 +---------+---------------+---------+-----------+----------+-------+ PFV      Full                                                 +---------+---------------+---------+-----------+----------+-------+ POP      Full           Yes      Yes                          +---------+---------------+---------+-----------+----------+-------+ PTV      Full                                                 +---------+---------------+---------+-----------+----------+-------+ PERO     Full                                                 +---------+---------------+---------+-----------+----------+-------+     Summary: Right: There is no evidence of deep vein thrombosis in the lower extremity. Left: There is no evidence of deep vein thrombosis in the lower extremity.  *See table(s) above for measurements and observations. Electronically signed by Sherald Hesshristopher Clark MD on 03/02/2019 at 4:35:56 PM.    Final     Scheduled Meds: . aspirin  325 mg Oral Daily  . clopidogrel  75 mg Oral Daily  . enoxaparin (LOVENOX) injection  40 mg Subcutaneous Q24H  . hydrALAZINE  50 mg Oral Q8H  . insulin aspart  0-9 Units Subcutaneous TID WC  . lisinopril  20 mg Oral Daily  Continuous Infusions: . cefTRIAXone (ROCEPHIN)  IV       LOS: 2  days    Time spent: 30 minutes.   Vassie Loll, MD [facility] Pager [phone]  03/03/2019, 9:59 AM

## 2019-03-03 NOTE — Progress Notes (Addendum)
STROKE TEAM PROGRESS NOTE   SUBJECTIVE (INTERVAL HISTORY) Pt is sitting in chair, watching TV with nap.  No neuro changes.  EP consulted and will do loop recorder on discharge.   OBJECTIVE Vitals:   03/03/19 0040 03/03/19 0418 03/03/19 0811 03/03/19 1215  BP: (!) 145/64 (!) 165/68 (!) 166/80 (!) 150/64  Pulse: 93 90 99 100  Resp: Temp: 98.4 F (36.9 C) 97.7 F (36.5 C) 97.8 F (36.6 C) 98.6 F (37 C)  TempSrc: Oral Oral Oral Oral  SpO2: 97% 94% 98% 98%  Weight:      Height:        CBC:  Recent Labs  Lab 02/28/19 2210 03/01/19 0247  WBC 13.2* 12.8*  NEUTROABS 10.7*  --   HGB 14.3 13.8  HCT 43.4 41.5  MCV 85.8 84.5  PLT 288 291    Basic Metabolic Panel:  Recent Labs  Lab 02/28/19 2210 03/01/19 0247  NA 141 139  K 3.9 3.9  CL 106 99  CO2 18* 20*  GLUCOSE 125* 106*  BUN 22 21  CREATININE 1.20* 1.10*  CALCIUM 9.3 9.3    Lipid Panel:     Component Value Date/Time   CHOL 123 03/01/2019 0247   TRIG 82 03/01/2019 0247   HDL 70 03/01/2019 0247   CHOLHDL 1.8 03/01/2019 0247   VLDL 16 03/01/2019 0247   LDLCALC 37 03/01/2019 0247   HgbA1c:  Lab Results  Component Value Date   HGBA1C 5.6 03/01/2019   Urine Drug Screen:     Component Value Date/Time   LABOPIA NONE DETECTED 03/03/2019 0033   COCAINSCRNUR NONE DETECTED 03/03/2019 0033   LABBENZ NONE DETECTED 03/03/2019 0033   AMPHETMU NONE DETECTED 03/03/2019 0033   THCU NONE DETECTED 03/03/2019 0033   LABBARB NONE DETECTED 03/03/2019 0033    Alcohol Level     Component Value Date/Time   ETH <10 02/28/2019 2210    IMAGING   Ct Head Wo Contrast  Result Date: 03/01/2019 CLINICAL DATA:  Stroke follow-up.  Change in mental status. EXAM: CT HEAD WITHOUT CONTRAST TECHNIQUE: Contiguous axial images were obtained from the base of the skull through the vertex without intravenous contrast. COMPARISON:  03/01/2019 FINDINGS: Brain: Unchanged does appearance of multifocal chronic ischemic change,  predominantly within the right hemisphere. Unchanged old right caudothalamic small vessel infarct. No intracranial hemorrhage. No midline shift or other mass effect. Unchanged size and configuration of ventricles. Vascular: No abnormal hyperdensity of the major intracranial arteries or dural venous sinuses. No intracranial atherosclerosis. Skull: The visualized skull base, calvarium and extracranial soft tissues are normal. Sinuses/Orbits: No fluid levels or advanced mucosal thickening of the visualized paranasal sinuses. No mastoid or middle ear effusion. The orbits are normal. IMPRESSION: Unchanged examination with findings of chronic small vessel ischemia. No acute abnormality. Electronically Signed   By: Deatra Robinson M.D.   On: 03/01/2019 22:33   Vas [place] Lower Extremity Venous (dvt)  Result Date: 03/02/2019  Lower Venous Study Indications: Stroke.  Comparison Study: No prior study on file for comparison. Performing Technologist: Sherren Kerns RVS  Examination Guidelines: A complete evaluation includes B-mode imaging, spectral Doppler, color Doppler, and power Doppler as needed of all accessible portions of each vessel. Bilateral testing is considered an integral part of a complete examination. Limited examinations for reoccurring indications may be performed as noted.  +---------+---------------+---------+-----------+----------+-------+ RIGHT    CompressibilityPhasicitySpontaneityPropertiesSummary +---------+---------------+---------+-----------+----------+-------+ CFV      Full  Yes      Yes                          +---------+---------------+---------+-----------+----------+-------+ SFJ      Full                                                 +---------+---------------+---------+-----------+----------+-------+ FV Prox  Full                                                 +---------+---------------+---------+-----------+----------+-------+ FV Mid   Full                                                  +---------+---------------+---------+-----------+----------+-------+ FV DistalFull                                                 +---------+---------------+---------+-----------+----------+-------+ PFV      Full                                                 +---------+---------------+---------+-----------+----------+-------+ POP      Full           Yes      Yes                          +---------+---------------+---------+-----------+----------+-------+ PTV      Full                                                 +---------+---------------+---------+-----------+----------+-------+ PERO     Full                                                 +---------+---------------+---------+-----------+----------+-------+   +---------+---------------+---------+-----------+----------+-------+ LEFT     CompressibilityPhasicitySpontaneityPropertiesSummary +---------+---------------+---------+-----------+----------+-------+ CFV      Full           Yes      Yes                          +---------+---------------+---------+-----------+----------+-------+ SFJ      Full                                                 +---------+---------------+---------+-----------+----------+-------+ FV Prox  Full                                                 +---------+---------------+---------+-----------+----------+-------+  FV Mid   Full                                                 +---------+---------------+---------+-----------+----------+-------+ FV DistalFull                                                 +---------+---------------+---------+-----------+----------+-------+ PFV      Full                                                 +---------+---------------+---------+-----------+----------+-------+ POP      Full           Yes      Yes                           +---------+---------------+---------+-----------+----------+-------+ PTV      Full                                                 +---------+---------------+---------+-----------+----------+-------+ PERO     Full                                                 +---------+---------------+---------+-----------+----------+-------+     Summary: Right: There is no evidence of deep vein thrombosis in the lower extremity. Left: There is no evidence of deep vein thrombosis in the lower extremity.  *See table(s) above for measurements and observations. Electronically signed by Sherald Hess MD on 03/02/2019 at 4:35:56 PM.    Final    Transthoracic Echocardiogram   1. The left ventricle has hyperdynamic systolic function, with an ejection fraction of >65%. The cavity size was normal. There is mildly increased left ventricular wall thickness. Left ventricular diastolic Doppler parameters are consistent with  impaired relaxation.  2. The right ventricle has normal systolic function. The cavity was normal. There is no increase in right ventricular wall thickness.  3. No evidence of mitral valve stenosis.  4. No stenosis of the aortic valve.  5. The aortic root and ascending aorta are normal in size and structure.  EKG -sinus rhythm.  Nonspecific T wave abnormalities.  PHYSICAL EXAM  Temp:  [97.7 F (36.5 C)-98.7 F (37.1 C)] 98.6 F (37 C) (05/31 1215) Pulse Rate:  [89-100] 100 (05/31 1215) Resp:  [17-20] 20 (05/31 1215) BP: (145-171)/(64-80) 150/64 (05/31 1215) SpO2:  [94 %-98 %] 98 % (05/31 1215)  General - Well nourished, well developed, in no apparent distress.  Ophthalmologic - fundi not visualized due to noncooperation.  Cardiovascular - Regular rate and rhythm.  Mental Status -  Level of arousal and orientation to time, place, and person were intact. Language including expression, naming, repetition, comprehension was assessed and found intact. Mild flat affect and  psychomotor slowing  Cranial Nerves II - XII - II -  left hemianopia. III, IV, VI - right gaze preference but able to move across midline, left gaze incomplete. V - Facial sensation intact bilaterally. VII - left mild nasolabial fold flattening. VIII - Hearing & vestibular intact bilaterally. X - Palate elevates symmetrically. XI - Chin turning & shoulder shrug intact bilaterally. XII - Tongue protrusion intact.  Motor Strength - The patient's strength was normal in all extremities and pronator drift was absent except LUE proximal 4+/5 and LLE distal 4/5.  Bulk was normal and fasciculations were absent.   Motor Tone - Muscle tone was assessed at the neck and appendages and was normal.  Reflexes - The patient's reflexes were symmetrical in all extremities and she had no pathological reflexes.  Sensory - Light touch, temperature/pinprick were assessed and were symmetrical.    Coordination - The patient had normal movements in the right handwith no ataxia or dysmetria, however, left FTN mild dysmetria.  Tremor was absent.  Gait and Station - deferred.   ASSESSMENT/PLAN Ms. DESHONDA BOWN is a 73 y.o. female with history of diabetes and hyperlipidemia, hypertension, CKD, multiple sclerosis, obesity, primary hyperparathyroidism status post surgery presenting with fall followed by altered mental status and left hemiparesis. She did not receive IV t-PA due to unknown last known normal  Stroke: Right MCA patchy infarcts and left ACA subacute punctate infarct - cardioembolic pattern, source unclear   Resultant left hemianopia, left mild hemiparesis  CT head no acute findings on CT  MRI head -Scattered distal right posterior temporoparietal MCA infarcts, there are also some small infarcts seen in left posterior parietal/frontal that are less intense on DWI, likely subacute in nature.  CTA H&N - R ICA proximal and bilateral siphon atherosclerosis. R M2 cut off in inferior division.   Bilateral Lower Extremity Venous Dopplers - negative for DVT  CTP 18 cc penumbra  2D Echo - EF more than 65%.  No wall motion abnormalities.  LE venous Doppler negative for DVT  Loop recorder will be placed by EP on discharge to rule out A. fib  Sars Corona Virus 2 neg  LDL - 37  HgbA1c - 5.6  UDS - neg  VTE prophylaxis - Lovenox  Patient taking estrogen PTA for hot flashes, recommended patient to discuss with her PCP/GYN for alternatives if able.  She is in agreement  none prior to admission, now on ASA 325 + Plavix for 3 months then monotherapy with ASA only given severe intracranial stenosis  Patient counseled to be compliant with her antithrombotic medications when able  Ongoing aggressive stroke risk factor management  Therapy recommendations:  SNF  Disposition:  Pending  Hypertension  Stable on the higher end  Resume home medication hydralazine and lisinopril . Long-term BP goal normotensive  Hyperlipidemia  Lipid lowering medication PTA:  lipitor 20  LDL 37, goal < 70  Hold off Lipitor as her LDL is low  Continue follow-up with PCP as outpatient  Multiple sclerosis  Has been following with Dr. 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 Please call with questions. Pt will follow up with Dr. Epimenio Foot at [facility] in about 4 weeks. Thanks for the consult.  Marvel Plan, MD PhD Stroke Neurology 03/04/2019 6:37  PM     To contact Stroke Continuity provider, please refer to [email or web address]. After hours, contact General Neurology

## 2019-03-04 DIAGNOSIS — I6340 Cerebral infarction due to embolism of unspecified cerebral artery: Secondary | ICD-10-CM

## 2019-03-04 DIAGNOSIS — L8990 Pressure ulcer of unspecified site, unspecified stage: Secondary | ICD-10-CM

## 2019-03-04 DIAGNOSIS — I6389 Other cerebral infarction: Secondary | ICD-10-CM

## 2019-03-04 LAB — BASIC METABOLIC PANEL
Anion gap: 10 (ref 5–15)
BUN: 29 mg/dL — ABNORMAL HIGH (ref 8–23)
CO2: 21 mmol/L — ABNORMAL LOW (ref 22–32)
Calcium: 8.7 mg/dL — ABNORMAL LOW (ref 8.9–10.3)
Chloride: 111 mmol/L (ref 98–111)
Creatinine, Ser: 1 mg/dL (ref 0.44–1.00)
GFR calc Af Amer: >60 mL/min (ref >60)
GFR calc non Af Amer: 55 mL/min — ABNORMAL LOW (ref >60)
Glucose, Bld: 161 mg/dL — ABNORMAL HIGH (ref 70–99)
Potassium: 4.2 mmol/L (ref 3.5–5.1)
Sodium: 142 mmol/L (ref 135–145)

## 2019-03-04 LAB — CBC
HCT: 34.2 % — ABNORMAL LOW (ref 36.0–46.0)
Hemoglobin: 11.1 g/dL — ABNORMAL LOW (ref 12.0–15.0)
MCH: 28 pg (ref 26.0–34.0)
MCHC: 32.5 g/dL (ref 30.0–36.0)
MCV: 86.4 fL (ref 80.0–100.0)
Platelets: 266 10*3/uL (ref 150–400)
RBC: 3.96 MIL/uL (ref 3.87–5.11)
RDW: 13.9 % (ref 11.5–15.5)
WBC: 6.9 10*3/uL (ref 4.0–10.5)
nRBC: 0 % (ref 0.0–0.2)

## 2019-03-04 LAB — GLUCOSE, CAPILLARY
Glucose-Capillary: 115 mg/dL — ABNORMAL HIGH (ref 70–99)
Glucose-Capillary: 129 mg/dL — ABNORMAL HIGH (ref 70–99)
Glucose-Capillary: 140 mg/dL — ABNORMAL HIGH (ref 70–99)
Glucose-Capillary: 142 mg/dL — ABNORMAL HIGH (ref 70–99)

## 2019-03-04 NOTE — Progress Notes (Signed)
Physical Therapy Treatment Patient Details Name: Janice Little MRN: [id number] DOB: [birth year] Today's Date: 03/04/2019    History of Present Illness Pt is a 73 y/o female who presents from home s/p fall and head injury. Pt unable to determine how long she was down before family found her. MRI revealed an acute infarct right MCA territory as well as small areas of recent infarct in the left frontal parietal lobe likely subacute in nature.    PT Comments    Patient seen for mobility progression. Pt is awake and alert and oriented X 4. Pt continues to demonstrate L side weakness and inattention. Pt requires +2 assist for functional transfer training utilizing Stedy standing frame. Pt with L lateral bias in sitting and standing and requires assistance for balance. Continue to progress as tolerated.     Follow Up Recommendations  SNF;Supervision/Assistance - 24 hour     Equipment Recommendations  None recommended by PT    Recommendations for Other Services       Precautions / Restrictions Precautions Precautions: Fall;Other (comment) Precaution Comments: L inattention Restrictions Weight Bearing Restrictions: No    Mobility  Bed Mobility Overal bed mobility: Needs Assistance Bed Mobility: Rolling;Sidelying to Sit Rolling: Mod assist Sidelying to sit: HOB elevated;Mod assist       General bed mobility comments: cues for sequencing and use of rail to go toward R side; assist with R LE and hips to EOB and to elevate trunk into sitting  Transfers Overall transfer level: Needs assistance   Transfers: Sit to/from Stand Sit to Stand: Max assist;+2 physical assistance;From elevated surface         General transfer comment: assist to power up into standing and to maintain standing balance; pt with flexed posture; cues for sequencing  Ambulation/Gait                 Stairs             Wheelchair Mobility    Modified Rankin (Stroke Patients  Only) Modified Rankin (Stroke Patients Only) Pre-Morbid Rankin Score: No significant disability Modified Rankin: Severe disability     Balance Overall balance assessment: Needs assistance Sitting-balance support: Feet supported;Bilateral upper extremity supported Sitting balance-Leahy Scale: Poor   Postural control: Left lateral lean Standing balance support: Bilateral upper extremity supported Standing balance-Leahy Scale: Zero                              Cognition Arousal/Alertness: Awake/alert Behavior During Therapy: Flat affect Overall Cognitive Status: Impaired/Different from baseline Area of Impairment: Safety/judgement;Problem solving;Following commands                       Following Commands: Follows one step commands inconsistently;Follows one step commands with increased time Safety/Judgement: Decreased awareness of safety;Decreased awareness of deficits Awareness: Intellectual Problem Solving: Slow processing;Decreased initiation;Difficulty sequencing;Requires verbal cues;Requires tactile cues        Exercises      General Comments        Pertinent Vitals/Pain Pain Assessment: No/denies pain    Home Living                      Prior Function            PT Goals (current goals can now be found in the care plan section) Acute Rehab PT Goals Patient Stated Goal: None stated Progress towards PT goals: Progressing toward goals  Frequency    Min 3X/week      PT Plan Current plan remains appropriate    Co-evaluation              AM-PAC PT "6 Clicks" Mobility   Outcome Measure  Help needed turning from your back to your side while in a flat bed without using bedrails?: A Lot Help needed moving from lying on your back to sitting on the side of a flat bed without using bedrails?: A Lot Help needed moving to and from a bed to a chair (including a wheelchair)?: Total Help needed standing up from a chair using  your arms (e.g., wheelchair or bedside chair)?: A Lot Help needed to walk in hospital room?: Total Help needed climbing 3-5 steps with a railing? : Total 6 Click Score: 9    End of Session Equipment Utilized During Treatment: Gait belt Activity Tolerance: Patient tolerated treatment well Patient left: with call bell/phone within reach;in chair;with chair alarm set Nurse Communication: Mobility status PT Visit Diagnosis: Unsteadiness on feet (R26.81);Other abnormalities of gait and mobility (R26.89);Muscle weakness (generalized) (M62.81);History of falling (Z91.81);Other symptoms and signs involving the nervous system (R29.898);Hemiplegia and hemiparesis Hemiplegia - Right/Left: Left Hemiplegia - caused by: Cerebral infarction     Time: 5809-9833 PT Time Calculation (min) (ACUTE ONLY): 22 min  Charges:  $Gait Training: 8-22 mins                     Erline Levine, PTA Acute Rehabilitation Services Pager: [phone] Office: [phone]     Carolynne Edouard 03/04/2019, 1:50 PM

## 2019-03-04 NOTE — Progress Notes (Signed)
Inpatient Rehab Admissions:  Inpatient Rehab Consult received.  I met with patient at the bedside for rehabilitation assessment and to discuss goals and expectations of an inpatient rehab admission.  Pt very lethargic and unable to maintain arousal for greater than ~10 seconds.  Note therapy recommendations are for SNF 2/2 lethargy and fatigue limiting pt's ability to meaningfully participate.  Will sign off at this time.    Signed: Shann Medal, PT, DPT Admissions Coordinator [phone] 03/04/19  11:44 AM

## 2019-03-04 NOTE — Progress Notes (Signed)
PROGRESS NOTE    Janice Little  ZOX:[id number] DOB: 07/26/1946 DOA: 02/28/2019 PCP: Juluis RainierBarnes, Elizabeth, MD     Brief Narrative:  As per H&P written by Dr. Toniann FailKakrakandy on 03/01/2019 73 y.o. female with history of hypertension, diabetes mellitus type 2, hyperlipidemia and multiple sclerosis was last seen by family on Monday 4 days ago was not able to be reached and so patient family called for wellness check and patient was found to be on the floor in her room.  Patient appeared mildly confused but was following commands and was brought to the ER.  Patient states she has fallen on Monday 4 days ago and not sure how she fell.  States she has not taken her medications since her fall.  ED Course: In the ER on exam patient not able to move her eyes towards the left.  And also has neglect on the left side and has mild weakness of the left upper and lower extremity.  CT head was unremarkable.  On-call neurologist Dr. Amada JupiterKirkpatrick was consulted and patient admitted for acute CVA.  Blood pressure is markedly elevated given the acute CVA will allow for permissive hypertension.  Patient denies any chest pain shortness of breath or any pain around the extremities.  EKG shows normal sinus rhythm.  Labs show creatinine of 1.2 hemoglobin 14.3 WBC 13.2 platelets 288.  CT of the C-spine was unremarkable chest x-ray was unremarkable.  Assessment & Plan: 1-acute CVA (cerebrovascular accident) (HCC) -With MRI demonstrating a scatted distal right posterior temporoparietal MCA infarct; there is also surrounding small infarct seen in left posterior parietal/frontal that are less intense on DWI. -CTA head and neck: Demonstrating right ICA proximal and bilateral siphon atherosclerosis.  R M2 cutoff in inferior deviation appreciated. -At this moment following neurology recommendations will use aspirin and Plavix for 3746-month with subsequent use of aspirin alone for secondary prevention. -Patient will require a loop  recorder to rule out arrhythmia. -Follow PT, OT and speech therapy recommendations (so far suggesting CIR). -LDL 37 -A1c 5.6  2-essential hypertension -Stable currently, permissive hypertension in the setting of acute ischemia -Will resume the use of hydralazine and lisinopril over the next 5 days -Follow vital signs.  3-history of multiple sclerosis/optic neuritis -[facility]  4-upper lipidemia -LDL 37 -Will hold Lipitor for now given low LDL -LFTs within normal limits.  5-class I obesity -Low calorie diet, portion control and increase physical activity discussed with patient -Body mass index is 32.03 kg/m.  6-primary hyperparathyroidism -Status post surgery -Stable calcium level.  7-physical deconditioning/global left residual weakness -In the setting of residual deficit from stroke -Follow PT/OT recommendation -So far recommending CIR.  8-increase frequency and dysuria -Check urine culture -Urinalysis with positive nitrites and elevated leukocyte esterase -Empiric treatment using Rocephin has been initiated.  DVT prophylaxis: Lovenox Code Status: Full  Family Communication: no family at bedside. Disposition Plan: So far speech therapy recommending CIR; PT/OT pending evaluation.  Neurology has recommended continued use of aspirin and Plavix for 3 months with subsequent use of aspirin for secondary prevention.  EP planning to implant loop recorder on day of discharge.  Consultants:   Neurology service  Procedures:   See below for x-ray report  2D echo: Demonstrating preserved ejection fraction, impaired relaxation of ventricular walls suggesting diastolic dysfunction; no wall motion and normalities.  Mild increased left ventricular wall thickness.  No thrombi or significant valvular abnormalities appreciated.  Antimicrobials:  Anti-infectives (From admission, onward)   Start  Dose/Rate Route Frequency Ordered Stop    03/03/19 1000  cefTRIAXone (ROCEPHIN) 1 g in sodium chloride 0.9 % 100 mL IVPB     1 g 200 mL/hr over 30 Minutes Intravenous Every 24 hours 03/03/19 0956        Subjective: No fever, no chest pain, no headaches, no shortness of breath.  Patient denies nausea and vomiting.  Objective: Vitals:   03/03/19 2013 03/04/19 0022 03/04/19 0359 03/04/19 0758  BP: (!) 159/63 (!) 174/66 (!) 184/62 (!) 177/82  Pulse: 98 (!) 102 97 (!) 109  Resp: 18 18 18 20   Temp: 98.9 F (37.2 C) 98.3 F (36.8 C) 98.4 F (36.9 C) 98.2 F (36.8 C)  TempSrc: Oral Oral Oral Oral  SpO2: 93% 94% 97% 96%  Weight:      Height:        Intake/Output Summary (Last 24 hours) at 03/04/2019 1510 Last data filed at 03/04/2019 0133 Gross per 24 hour  Intake 240 ml  Output 750 ml  Net -510 ml   Filed Weights   03/01/19 0251  Weight: 74.4 kg    Examination: General exam: Alert, awake, oriented x 3; sleepy today; no chest pain, no nausea, no vomiting.  Patient is slow to respond but appropriate.  Left hemianopsia and left-sided weakness appreciated. Respiratory system: Clear to auscultation. Respiratory effort normal. Cardiovascular system:RRR. No murmurs, rubs, gallops. Gastrointestinal system: Abdomen is nondistended, soft and nontender. No organomegaly or masses felt. Normal bowel sounds heard. Central nervous system: Alert and oriented.  No new focal neurological deficits.  Continued to have left-sided weakness, left hemianopsia residual expressive aphasia.   Extremities: No C/C/E, +pedal pulses Skin: No rashes, lesions or ulcers Psychiatry: Judgement and insight appear normal. Mood & affect appropriate.    Data Reviewed: I have personally reviewed following labs and imaging studies  CBC: Recent Labs  Lab 02/28/19 2210 03/01/19 0247 03/04/19 0537  WBC 13.2* 12.8* 6.9  NEUTROABS 10.7*  --   --   HGB 14.3 13.8 11.1*  HCT 43.4 41.5 34.2*  MCV 85.8 84.5 86.4  PLT 288 291 266   Basic Metabolic Panel:  Recent Labs  Lab 02/28/19 2210 03/01/19 0247 03/04/19 0537  NA 141 139 142  K 3.9 3.9 4.2  CL 106 99 111  CO2 18* 20* 21*  GLUCOSE 125* 106* 161*  BUN 22 21 29*  CREATININE 1.20* 1.10* 1.00  CALCIUM 9.3 9.3 8.7*   GFR: Estimated Creatinine Clearance: 44.5 mL/min (by C-G formula based on SCr of 1 mg/dL).   Liver Function Tests: Recent Labs  Lab 02/28/19 2210 03/01/19 0247  AST 34 29  ALT 20 19  ALKPHOS 96 90  BILITOT 1.1 1.2  PROT 6.1* 5.7*  ALBUMIN 3.7 3.5   Cardiac Enzymes: Recent Labs  Lab 02/28/19 2210  CKTOTAL 364*  TROPONINI <0.03   CBG: Recent Labs  Lab 03/03/19 1214 03/03/19 1705 03/03/19 2104 03/04/19 0757 03/04/19 1200  GLUCAP 220* 139* 160* 140* 129*   Urine analysis:    Component Value Date/Time   COLORURINE YELLOW 03/03/2019 0033   APPEARANCEUR CLOUDY (A) 03/03/2019 0033   LABSPEC 1.019 03/03/2019 0033   PHURINE 5.0 03/03/2019 0033   GLUCOSEU NEGATIVE 03/03/2019 0033   HGBUR SMALL (A) 03/03/2019 0033   BILIRUBINUR NEGATIVE 03/03/2019 0033   KETONESUR NEGATIVE 03/03/2019 0033   PROTEINUR 30 (A) 03/03/2019 0033   UROBILINOGEN 0.2 11/19/2009 1055   NITRITE POSITIVE (A) 03/03/2019 0033   LEUKOCYTESUR LARGE (A) 03/03/2019 0033  Recent Results (from the past 240 hour(s))  [facility] (CEPHEID - Performed in [facility]), Hosp Order     Status: None   Collection Time: 02/28/19 11:38 PM  Result Value Ref Range Status   SARS Coronavirus 2 NEGATIVE NEGATIVE Final    Comment: (NOTE) If result is NEGATIVE SARS-CoV-2 target nucleic acids are NOT DETECTED. The SARS-CoV-2 RNA is generally detectable in upper and lower  respiratory specimens during the acute phase of infection. The lowest  concentration of SARS-CoV-2 viral copies this assay can detect is 250  copies / mL. A negative result does not preclude SARS-CoV-2 infection  and should not be used as the sole basis for treatment or other  patient management decisions.   A negative result may occur with  improper specimen collection / handling, submission of specimen other  than nasopharyngeal swab, presence of viral mutation(s) within the  areas targeted by this assay, and inadequate number of viral copies  (<250 copies / mL). A negative result must be combined with clinical  observations, patient history, and epidemiological information. If result is POSITIVE SARS-CoV-2 target nucleic acids are DETECTED. The SARS-CoV-2 RNA is generally detectable in upper and lower  respiratory specimens dur ing the acute phase of infection.  Positive  results are indicative of active infection with SARS-CoV-2.  Clinical  correlation with patient history and other diagnostic information is  necessary to determine patient infection status.  Positive results do  not rule out bacterial infection or co-infection with other viruses. If result is PRESUMPTIVE POSTIVE SARS-CoV-2 nucleic acids MAY BE PRESENT.   A presumptive positive result was obtained on the submitted specimen  and confirmed on repeat testing.  While 2019 novel coronavirus  (SARS-CoV-2) nucleic acids may be present in the submitted sample  additional confirmatory testing may be necessary for epidemiological  and / or clinical management purposes  to differentiate between  SARS-CoV-2 and other Sarbecovirus currently known to infect humans.  If clinically indicated additional testing with an alternate test  methodology [phone]) is advised. The SARS-CoV-2 RNA is generally  detectable in upper and lower respiratory sp ecimens during the acute  phase of infection. The expected result is Negative. Fact Sheet for Patients:  BoilerBrush.com.cy Fact Sheet for Healthcare Providers: https://pope.com/ This test is not yet approved or cleared by the [place] FDA and has been authorized for detection and/or diagnosis of SARS-CoV-2 by FDA under an Emergency Use  Authorization (EUA).  This EUA will remain in effect (meaning this test can be used) for the duration of the COVID-19 declaration under Section 564(b)(1) of the Act, 21 U.S.C. section 360bbb-3(b)(1), unless the authorization is terminated or revoked sooner. Performed at [facility], [address], [place], [place] [address]     Radiology Studies: No results found.  Scheduled Meds: . aspirin  325 mg Oral Daily  . clopidogrel  75 mg Oral Daily  . enoxaparin (LOVENOX) injection  40 mg Subcutaneous Q24H  . hydrALAZINE  50 mg Oral Q8H  . insulin aspart  0-9 Units Subcutaneous TID WC  . lisinopril  20 mg Oral Daily   Continuous Infusions: . cefTRIAXone (ROCEPHIN)  IV 1 g (03/04/19 0817)     LOS: 3 days    Time spent: 30 minutes.   Vassie Loll, MD [facility] Pager [phone]  03/04/2019, 3:10 PM

## 2019-03-04 NOTE — Consult Note (Addendum)
ELECTROPHYSIOLOGY CONSULT NOTE  Patient ID: Janice Little MRN: [id number], DOB/AGE: Apr 28, 1946   Admit date: 02/28/2019 Date of Consult: 03/04/2019  Primary Physician: Juluis Rainier, MD Primary Cardiologist: none Reason for Consultation: Cryptogenic stroke ; recommendations regarding Implantable Loop Recorder, requested by Dr. Roda Shutters  History of Present Illness [name] was admitted on 02/28/2019 with stroke.  They first developed symptoms while at home.  PMHx includes HTN, HLD, DM, MS Neurology noted Right MCA patchy infarcts and left ACA subacute punctate infarct - cardioembolic pattern, source unclear  .  she has undergone workup for stroke including echocardiogram and carotid angio.  The patient has been monitored on telemetry which has demonstrated sinus rhythm with no arrhythmias.  Inpatient stroke work-up will not include TEE given COVID19 restrictions.   Echocardiogram this admission demonstrated  IMPRESSIONS 1. The left ventricle has hyperdynamic systolic function, with an ejection fraction of >65%. The cavity size was normal. There is mildly increased left ventricular wall thickness. Left ventricular diastolic Doppler parameters are consistent with  impaired relaxation.  2. The right ventricle has normal systolic function. The cavity was normal. There is no increase in right ventricular wall thickness.  3. No evidence of mitral valve stenosis.  4. No stenosis of the aortic valve.  5. The aortic root and ascending aorta are normal in size and structure.  6. The interatrial septum was not assessed.  FINDINGS  Left Ventricle: The left ventricle has hyperdynamic systolic function, with an ejection fraction of >65%. The cavity size was normal. There is mildly increased left ventricular wall thickness. Left ventricular diastolic Doppler parameters are consistent  with impaired relaxation.  Right Ventricle: The right ventricle has normal systolic function. The cavity  was normal. There is no increase in right ventricular wall thickness.  Left Atrium: Left atrial size was normal in size.  Right Atrium: Right atrial size was normal in size. Right atrial pressure is estimated at 10 mmHg.  Interatrial Septum: The interatrial septum was not assessed.  Pericardium: There is no evidence of pericardial effusion.  Mitral Valve: The mitral valve is normal in structure. Mitral valve regurgitation is not visualized by color flow Doppler. No evidence of mitral valve stenosis.  Tricuspid Valve: The tricuspid valve is normal in structure. Tricuspid valve regurgitation is trivial by color flow Doppler.  Aortic Valve: The aortic valve is normal in structure. Aortic valve regurgitation was not visualized by color flow Doppler. There is No stenosis of the aortic valve.  Pulmonic Valve: The pulmonic valve was grossly normal. Pulmonic valve regurgitation was not assessed by color flow Doppler.  Aorta: The aortic root and ascending aorta are normal in size and structure.   03/02/2019: LE venous US Summary: Right: There is no evidence of deep vein thrombosis in the lower extremity. Left: There is no evidence of deep vein thrombosis in the lower extremity.    Lab work is reviewed.   Prior to admission, the patient denies chest pain, shortness of breath, dizziness, palpitations, or syncope.  They are recovering from their stroke with plans to SNF/CIR at discharge.   Past Medical History:  Diagnosis Date   Chronic kidney disease (CKD), stage II (mild)    Chronic pain    DM type 2 (diabetes mellitus, type 2) (HCC)    Fatty liver    per CT   GERD (gastroesophageal reflux disease)    History of hepatitis as a child    age 23 (jaundice)   HTN (hypertension)  Hyperlipidemia    MS (multiple sclerosis) (HCC)    Obesity    Osteopenia    Primary hyperparathyroidism (HCC)    s/p surgery removal of parathyroid (Dr. Sharl Ma, Dr. Gerrit Friends)   Vitamin D  deficiency      Surgical History:  Past Surgical History:  Procedure Laterality Date   ANTERIOR CRUCIATE LIGAMENT REPAIR Left    VESICOVAGINAL FISTULA CLOSURE W/ TAH       Medications Prior to Admission  Medication Sig Dispense Refill Last Dose   atorvastatin (LIPITOR) 20 MG tablet Take 20 mg by mouth daily.   12/18/2018   estradiol (ESTRACE) 0.5 MG tablet Take 0.5 mg by mouth daily.   12/18/2018   hydrALAZINE (APRESOLINE) 50 MG tablet Take 1 tablet (50 mg total) by mouth every 8 (eight) hours for 30 days. 90 tablet 0    JANUVIA 50 MG tablet Take 50 mg by mouth daily.   12/18/2018   lisinopril (PRINIVIL,ZESTRIL) 20 MG tablet Take 1 tablet (20 mg total) by mouth daily for 30 days. 30 tablet 0    metFORMIN (GLUCOPHAGE-XR) 500 MG 24 hr tablet Take 1,000 mg by mouth 2 (two) times daily.   12/18/2018   Multiple Vitamin (MULTIVITAMIN) capsule Take 1 capsule by mouth daily.   12/18/2018   omeprazole (PRILOSEC OTC) 20 MG tablet Take 20 mg by mouth daily.   12/18/2018   UNABLE TO FIND Incromega [id number]  (fish oils)   12/18/2018    Inpatient Medications:   aspirin  325 mg Oral Daily   clopidogrel  75 mg Oral Daily   enoxaparin (LOVENOX) injection  40 mg Subcutaneous Q24H   hydrALAZINE  50 mg Oral Q8H   insulin aspart  0-9 Units Subcutaneous TID WC   lisinopril  20 mg Oral Daily    Allergies: No Known Allergies  Social History   Socioeconomic History   Marital status: Legally Separated    Spouse name: Not on file   Number of children: Not on file   Years of education: Not on file   Highest education level: Not on file  Occupational History   Not on file  Social Needs   Financial resource strain: Not on file   Food insecurity:    Worry: Not on file    Inability: Not on file   Transportation needs:    Medical: Not on file    Non-medical: Not on file  Tobacco Use   Smoking status: Never Smoker   Smokeless tobacco: Never Used  Substance and Sexual Activity    Alcohol use: No   Drug use: No   Sexual activity: Not on file  Lifestyle   Physical activity:    Days per week: Not on file    Minutes per session: Not on file   Stress: Not on file  Relationships   Social connections:    Talks on phone: Not on file    Gets together: Not on file    Attends religious service: Not on file    Active member of club or organization: Not on file    Attends meetings of clubs or organizations: Not on file    Relationship status: Not on file   Intimate partner violence:    Fear of current or ex partner: Not on file    Emotionally abused: Not on file    Physically abused: Not on file    Forced sexual activity: Not on file  Other Topics Concern   Not on file  Social History Narrative  Not on file     Family History  Problem Relation Age of Onset   Mental illness Mother    COPD Father    Lung cancer Father       Review of Systems: All other systems reviewed and are otherwise negative except as noted above.  Physical Exam: Vitals:   03/03/19 1707 03/03/19 2013 03/04/19 0022 03/04/19 0359  BP: (!) 169/67 (!) 159/63 (!) 174/66 (!) 184/62  Pulse: 91 98 (!) 102 97  Resp: 18 18 18 18   Temp: 98.3 F (36.8 C) 98.9 F (37.2 C) 98.3 F (36.8 C) 98.4 F (36.9 C)  TempSrc: Oral Oral Oral Oral  SpO2: 99% 93% 94% 97%  Weight:      Height:       LIMITED  VISUAL EXAM GIVEN COVID 19 PANDEMIC  GEN- The patient is well appearing, alert and oriented x 3 today.   Head- normocephalic, atraumatic Eyes-  Sclera clear, conjunctiva pink Ears- hearing intact Oropharynx- not examined Neck- supple Lungs- normal WOB Heart- telemetry and echo reviewed  GI- not examined Extremities- no clubbing, cyanosis, or edema MS- no obvious significant deformity Skin- no rash or lesion Psych- euthymic mood, full affect, very pleasent   Labs:   Lab Results  Component Value Date   WBC 6.9 03/04/2019   HGB 11.1 (L) 03/04/2019   HCT 34.2 (L)  03/04/2019   MCV 86.4 03/04/2019   PLT 266 03/04/2019    Recent Labs  Lab 03/01/19 0247 03/04/19 0537  NA 139 142  K 3.9 4.2  CL 99 111  CO2 20* 21*  BUN 21 29*  CREATININE 1.10* 1.00  CALCIUM 9.3 8.7*  PROT 5.7*  --   BILITOT 1.2  --   ALKPHOS 90  --   ALT 19  --   AST 29  --   GLUCOSE 106* 161*   Lab Results  Component Value Date   CKTOTAL 364 (H) 02/28/2019   TROPONINI <0.03 02/28/2019   Lab Results  Component Value Date   CHOL 123 03/01/2019   Lab Results  Component Value Date   HDL 70 03/01/2019   Lab Results  Component Value Date   LDLCALC 37 03/01/2019   Lab Results  Component Value Date   TRIG 82 03/01/2019   Lab Results  Component Value Date   CHOLHDL 1.8 03/01/2019   No results found for: LDLDIRECT  No results found for: DDIMER   Radiology/Studies:   Ct Angio Head W Or Wo Contrast Result Date: 03/01/2019 CLINICAL DATA:  Initial evaluation for acute altered mental status, possible visual field cut. EXAM: CT ANGIOGRAPHY HEAD AND NECK CT PERFUSION BRAIN TECHNIQUE: Multidetector CT imaging of the head and neck was performed using the standard protocol during bolus administration of intravenous contrast. Multiplanar CT image reconstructions and MIPs were obtained to evaluate the vascular anatomy. Carotid stenosis measurements (when applicable) are obtained utilizing NASCET criteria, using the distal internal carotid diameter as the denominator. Multiphase CT imaging of the brain was performed following IV bolus contrast injection. Subsequent parametric perfusion maps were calculated using RAPID software. CONTRAST:  OMNIPAQUE IOHEXOL 350 MG/ML SOLN COMPARISON:  Prior CT from earlier the same evening. FINDINGS: CT HEAD FINDINGS Brain: Generalized age-related cerebral atrophy with moderate chronic small vessel ischemic disease. Multiple chronic lacunar infarcts noted within the bilateral basal ganglia and right thalamus. Extremely subtle loss of gray-white  matter differentiation with evolving hypodensity seen at the posterior right temporal region (series 5, image 16), suspicious for  of all vein posterior right MCA territory infarct. This correlates with area of ischemic penumbra on concomitant perfusion exam. Otherwise, gray-white matter differentiation maintained with no other acute large vessel territory infarct. No acute intracranial hemorrhage. No mass lesion, midline shift or mass effect. No hydrocephalus. No extra-axial fluid collection. Vascular: Single focal hyperdensity involving a proximal right M2 branch, favored to reflect focal atherosclerotic plaque rather than thrombus (series 8, image 20). No other hyperdense vessel. Scattered vascular calcifications noted within the carotid siphons. Skull: Scalp soft tissues and calvarium within normal limits. Sinuses/Orbits: Globes and orbital soft tissues within normal limits. Paranasal sinuses are largely clear. No mastoid effusion. Other: None. ASPECTS ([place] Stroke Program Early CT Score) - Ganglionic level infarction (caudate, lentiform nuclei, internal capsule, insula, M1-M3 cortex): 6 - Supraganglionic infarction (M4-M6 cortex): 3 Total score (0-10 with 10 being normal): 9 Review of the MIP images confirms the above findings CTA NECK FINDINGS Aortic arch: Visualized aortic arch of normal caliber with normal 3 vessel morphology. Mild scattered atheromatous plaque about the arch and origin of the great vessels without hemodynamically significant stenosis. Visualized subclavian arteries widely patent. Right carotid system: Right common carotid artery patent from its origin to the bifurcation without stenosis. Mixed plaque about the right bifurcation/proximal right ICA without hemodynamically significant stenosis. Right ICA widely patent distally to the skull base without stenosis, dissection or occlusion. Left carotid system: Left common carotid artery patent from its origin to the bifurcation without  stenosis. No significant atheromatous change or narrowing about the left bifurcation. Left ICA widely patent distally to the skull base without stenosis, dissection, or occlusion. Vertebral arteries: Both of the vertebral arteries arise from the subclavian arteries. Vertebral arteries widely patent within the neck without stenosis, dissection or occlusion. Right vertebral artery slightly dominant. Skeleton: No acute osseous finding. No discrete worrisome lytic or blastic osseous lesions. Eagle syndrome noted. Other neck: No other acute soft tissue abnormality about the neck. Scattered calcified sialoliths with multifocal stones at the right floor of mouth. Postsurgical changes noted at the left thyroid lobe. Upper chest: Visualized upper chest demonstrates no acute finding. Review of the MIP images confirms the above findings CTA HEAD FINDINGS Anterior circulation: Petrous segments patent bilaterally. Mild atheromatous plaque within the carotid siphons without hemodynamically significant stenosis. A1 segments widely patent. Normal anterior communicating artery. Anterior cerebral arteries patent to their distal aspects without stenosis. Left M1 widely patent. Normal left MCA bifurcation. Distal left MCA branches well perfused. Right M1 widely patent. Normal right MCA bifurcation. Abrupt cut off of a proximal right M2 branch inferior division, favored to reflect a severe short-segment stenosis (series 11, image 97). Flow is seen distally, although is fairly attenuated. Superior division remains patent as does its distal branches. Posterior circulation: Vertebral arteries patent to the vertebrobasilar junction without stenosis. Posterior inferior cerebral arteries not well assessed on this examination. Basilar artery mildly diminutive but patent to its distal aspect without stenosis. Superior cerebral arteries patent bilaterally. Left PCA arises from the basilar. Fetal type origin of the right PCA. PCAs widely patent  to their distal aspects without stenosis. Venous sinuses: Grossly patent allowing for timing of the contrast bolus. Anatomic variants: Fetal type right PCA. Delayed phase: Not performed. Review of the MIP images confirms the above findings CT Brain Perfusion Findings: ASPECTS: 9 CBF (<30%) Volume: 0mL Perfusion (Tmax>6.0s) volume: 18mL Mismatch Volume: 18mL Infarction Location:Negative CT perfusion for core infarct. 18 cc ischemic penumbra at the posterior right temporal lobe, posterior right MCA distribution. IMPRESSION:  1. Abrupt cut off of a proximal right M2 branch, inferior division, favored to reflect a short-segment severe high-grade stenosis. Flow is seen distally within the right MCA branches, although is attenuated and reduced. 2. Focal 18 cc ischemic penumbra at the posterior right temporal lobe, corresponding with the right M2 severe stenosis. Small occult core infarct in this region is suspected given the subtle early changes on the noncontrast head CT portion of this exam. 3. Mild atherosclerotic change about the aortic arch, right carotid bifurcation, and carotid siphons without hemodynamically significant stenosis. Critical Value/emergent results were called by telephone at the time of interpretation on 03/01/2019 at 1:26 am to Dr. Ritta Slot , who verbally acknowledged these results. Electronically Signed   By: Rise Mu M.D.   On: 03/01/2019 01:56     Dg Pelvis 1-2 Views Result Date: 03/01/2019 CLINICAL DATA:  Recent fall. Unable to inverted left foot. Question stroke. EXAM: PELVIS - 1-2 VIEW COMPARISON:  None FINDINGS: Contrast fills the urinary bladder.  Bladder is unremarkable. Pelvis is intact.  Lower lumbar spine is within. Lucency is noted along the greater trochanter. This may represent an avulsion fracture.  Electronically SIMPRESSION: 1. Possible avulsion fracture along the greater trochanter. Recommend dedicated radiographs of the left hip further evaluation. 2.  The pelvis is otherwise unremarkable.igned   By: Marin Roberts M.D.   On: 03/01/2019 08:37    Ct Head Wo Contrast Result Date: 03/01/2019 CLINICAL DATA:  Stroke follow-up.  Change in mental status. EXAM: CT HEAD WITHOUT CONTRAST TECHNIQUE: Contiguous axial images were obtained from the base of the skull through the vertex without intravenous contrast. COMPARISON:  03/01/2019 FINDINGS: Brain: Unchanged does appearance of multifocal chronic ischemic change, predominantly within the right hemisphere. Unchanged old right caudothalamic small vessel infarct. No intracranial hemorrhage. No midline shift or other mass effect. Unchanged size and configuration of ventricles. Vascular: No abnormal hyperdensity of the major intracranial arteries or dural venous sinuses. No intracranial atherosclerosis. Skull: The visualized skull base, calvarium and extracranial soft tissues are normal. Sinuses/Orbits: No fluid levels or advanced mucosal thickening of the visualized paranasal sinuses. No mastoid or middle ear effusion. The orbits are normal. IMPRESSION: Unchanged examination with findings of chronic small vessel ischemia. No acute abnormality. Electronically Signed   By: Deatra Robinson M.D.   On: 03/01/2019 22:33     Ct Head Wo Contrast Result Date: 02/28/2019 CLINICAL DATA:  Fall with hematoma to left side of head EXAM: CT HEAD WITHOUT CONTRAST CT CERVICAL SPINE WITHOUT CONTRAST TECHNIQUE: Multidetector CT imaging of the head and cervical spine was performed following the standard protocol without intravenous contrast. Multiplanar CT image reconstructions of the cervical spine were also generated. COMPARISON:  MRI 12/19/2018, CT brain 12/19/2018, MRI 04/17/2015 FINDINGS: CT HEAD FINDINGS Brain: No acute territorial infarction, hemorrhage or intracranial mass. Mild atrophy. Moderate small vessel ischemic changes of the white matter. Chronic lacunar infarcts within the right thalamus and left basal ganglia and  right white matter. Stable ventricle size. Vascular: No hyperdense vessels.  Carotid vascular calcification Skull: Normal. Negative for fracture or focal lesion. Sinuses/Orbits: No acute finding. Other: None CT CERVICAL SPINE FINDINGS Alignment: No subluxation.  Facet alignment within normal limits Skull base and vertebrae: No acute fracture. No primary bone lesion or focal pathologic process. Soft tissues and spinal canal: No prevertebral fluid or swelling. No visible canal hematoma. Disc levels: Moderate degenerative changes at C4-C5, C5-C6 and C6-C7. Posterior disc osteophyte complex at multiple levels. Upper chest: Negative.  Mild carotid  vascular calcification Other: None IMPRESSION: 1. No CT evidence for acute intracranial abnormality. Atrophy and small vessel ischemic changes of the white matter. 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 Degenerative changes of the cervical spine. No acute osseous abnormality. Electronically Signed   By: Jasmine Pang M.D.   On: 02/28/2019 23:16     Mr Brain Wo Contrast Result Date: 03/01/2019 CLINICAL DATA:  Stroke. History of diabetes, hyperlipidemia, hypertension. History of multiple sclerosis. EXAM: MRI HEAD WITHOUT CONTRAST TECHNIQUE: Multiplanar, multiecho pulse sequences of the brain and surrounding structures were obtained without intravenous contrast. COMPARISON:  CTA 03/01/2019.  MRI head 12/19/2018 FINDINGS: Brain: Acute infarct in the right posterior temporoparietal lobe. Multiple small patchy areas of restricted diffusion are seen in this territory. Occlusion of right M2 branch noted on CTA. Small areas of acute or subacute infarct in the left posterior parietal lobe and left frontal lobe over the convexity. Ventricle size is normal. Mild atrophy. Extensive changes throughout the periventricular deep white matter bilaterally. Chronic lacunar infarctions in the basal ganglia bilaterally brainstem and cerebellum intact. Several  areas of chronic microhemorrhage in the brain. Negative for mass or edema. Vascular: Normal arterial flow voids Skull and upper cervical spine: Negative Sinuses/Orbits: Negative Other: None IMPRESSION: Acute infarct right MCA territory corresponding to right M2 occlusion on CTA. This involves the posterior temporoparietal lobe. Small areas of recent infarct in the left frontal parietal lobe likely subacute nature. Possible emboli. Advanced chronic white matter changes. This is likely due to a combination of chronic microvascular ischemia and multiple sclerosis. Electronically Signed   By: Marlan Palau M.D.   On: 03/01/2019 08:04    Dg Chest Portable 1 View Result Date: 02/28/2019 CLINICAL DATA:  Altered mental status EXAM: PORTABLE CHEST 1 VIEW COMPARISON:  12/19/2018 FINDINGS: No acute opacity or pleural effusion. Mild scarring at the left base. Normal heart size. No pneumothorax. IMPRESSION: No active disease. Electronically Signed   By: Jasmine Pang M.D.   On: 02/28/2019 22:52     Vas [place] Lower Extremity Venous (dvt) Result Date: 03/02/2019  Lower Venous Study Indications: Stroke.  Comparison Study: No prior study on file for comparison. Performing Technologist: Sherren Kerns RVS  Examination Guidelines: A complete evaluation includes B-mode imaging, spectral Doppler, color Doppler, and power Doppler as needed of all accessible portions of each vessel. Bilateral testing is considered an integral part of a complete examination. Limited examinations for reoccurring indications may be performed as noted.   Summary: Right: There is no evidence of deep vein thrombosis in the lower extremity. Left: There is no evidence of deep vein thrombosis in the lower extremity.  *See table(s) above for measurements and observations. Electronically signed by Sherald Hess MD on 03/02/2019 at 4:35:56 PM.    Final     12-lead ECG SR All prior EKG's in EPIC reviewed with no documented atrial  fibrillation  Telemetry SR  Assessment and Plan:  1. Cryptogenic stroke The patient presents with cryptogenic stroke.  Harland German spoke at length with the patient about monitoring for afib with either a 30 day event monitor or an implantable loop recorder.  Risks, benefits, and alteratives to implantable loop recorder were discussed with the patient today.   At this time, the patient is very clear in her decision to proceed with implantable loop recorder.   Please call EP service back to case when day of discharge is known.  We will implant on day of discharge.  Wound care will  be discussed and wound check visit will be made once implant is completed.    Sheilah Pigeon, PA-C 03/04/2019 A/P  Cryptogenic Stroke(s)  HTN  DM   No hx of palpitations; multiple lesions   Will plan loop recorder insertion prior to discharge Have reviewed with the patient and floor staff.

## 2019-03-04 NOTE — TOC Initial Note (Signed)
Transition of Care [facility]) - Initial/Assessment Note    Patient Details  Name: Janice Little MRN: [id number] Date of Birth: 07-13-1946  Transition of Care [facility]) CM/SW Contact:    Mearl Latin, LCSW Phone Number: 03/04/2019, 4:50 PM  Clinical Narrative:                 CSW received consult for possible SNF placement at time of discharge. CSW spoke with patient regarding PT recommendation of SNF placement at time of discharge. Patient reported that her family is currently unable to care for patient given patient's current physical needs and fall risk and she lives alone. Patient expressed understanding of PT recommendation and is agreeable to SNF placement at time of discharge. Patient reports preference for [facility] since she was there earlier this year, but they are not accepting new patients at this time. CSW discussed insurance authorization process and provided Medicare SNF ratings list. Patient stated she would accept a bed offer at [facility] since they have private rooms. Patient expressed being hopeful for rehab and to feel better soon. No further questions reported at this time. CSW to continue to follow and assist with discharge planning needs.   Expected Discharge Plan: Skilled Nursing Facility Barriers to Discharge: Continued Medical Work up   Patient Goals and CMS Choice Patient states their goals for this hospitalization and ongoing recovery are:: Rehab CMS Medicare.gov Compare Post Acute Care list provided to:: Patient Choice offered to / list presented to : Patient  Expected Discharge Plan and Services Expected Discharge Plan: Skilled Nursing Facility In-house Referral: Clinical Social Work Discharge Planning Services: NA Post Acute Care Choice: Skilled Nursing Facility Living arrangements for the past 2 months: Apartment                 DME Arranged: N/A DME Agency: NA       HH Arranged: NA HH Agency: NA        Prior Living  Arrangements/Services Living arrangements for the past 2 months: Apartment Lives with:: Self Patient language and need for interpreter reviewed:: Yes Do you feel safe going back to the place where you live?: Yes      Need for Family Participation in Patient Care: No (Comment) Care giver support system in place?: No (comment) Current home services: DME Criminal Activity/Legal Involvement Pertinent to Current Situation/Hospitalization: No - Comment as needed  Activities of Daily Living Home Assistive Devices/Equipment: Cane (specify quad or straight)(STRAIGHT) ADL Screening (condition at time of admission) Patient's cognitive ability adequate to safely complete daily activities?: Yes Is the patient deaf or have difficulty hearing?: No Does the patient have difficulty seeing, even when wearing glasses/contacts?: No Does the patient have difficulty concentrating, remembering, or making decisions?: No Patient able to express need for assistance with ADLs?: Yes Does the patient have difficulty dressing or bathing?: No Independently performs ADLs?: No Communication: Independent Dressing (OT): Needs assistance Is this a change from baseline?: Pre-admission baseline Grooming: Needs assistance Is this a change from baseline?: Pre-admission baseline Feeding: Needs assistance Is this a change from baseline?: Pre-admission baseline Bathing: Needs assistance Is this a change from baseline?: Pre-admission baseline Toileting: Needs assistance Is this a change from baseline?: Pre-admission baseline In/Out Bed: Needs assistance Is this a change from baseline?: Pre-admission baseline Walks in Home: Independent Does the patient have difficulty walking or climbing stairs?: No Weakness of Legs: Both Weakness of Arms/Hands: Both  Permission Sought/Granted Permission sought to share information with : Magazine features editor, Family Supports  Permission granted to share information with : Yes,  Verbal Permission Granted  Share Information with NAME: Thayer Ohm  Permission granted to share info w AGENCY: SNFs  Permission granted to share info w Relationship: Son  Permission granted to share info w Contact Information: [phone]  Emotional Assessment Appearance:: Appears stated age Attitude/Demeanor/Rapport: Gracious, Engaged Affect (typically observed): Accepting, Appropriate, Calm Orientation: : Oriented to Self, Oriented to Place, Oriented to  Time, Oriented to Situation Alcohol / Substance Use: Not Applicable Psych Involvement: No (comment)  Admission diagnosis:  Focal neurological deficit [R29.818] Fall, initial encounter [W19.XXXA] Altered mental status, unspecified altered mental status type [R41.82] Acute CVA (cerebrovascular accident) [facility]) [I63.9] Patient Active Problem List   Diagnosis Date Noted  . Pressure injury of skin 03/04/2019  . Acute lower UTI   . Stroke (HCC) 03/02/2019  . Acute CVA (cerebrovascular accident) (HCC) 03/01/2019  . Hypertensive urgency 12/19/2018  . History of myelitis 12/26/2017  . History of optic neuritis 04/02/2015  . Diplopia 04/02/2015  . Urinary incontinence 04/02/2015  . Other fatigue 04/02/2015  . 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 Numbness 04/02/2015  . Ataxic gait 04/02/2015  . Essential tremor 04/02/2015  . PRIMARY HYPERPARATHYROIDISM 02/01/2008  . OSTEOPOROSIS 02/01/2008  . Diabetes mellitus with diabetic neuropathy (HCC) 01/31/2008  . Hyperlipidemia 01/31/2008  . Multiple sclerosis (HCC) 01/31/2008  . Optic neuritis 01/31/2008  . Essential hypertension 01/31/2008  . GERD 01/31/2008   PCP:  Juluis Rainier, MD Pharmacy:   [facility] [address] [place] - [address] [place] [address] Phone: [phone] Fax: [phone]  [facility] - [place], [place] - [address] [facility] [address] [place] [address] [place] [address] Phone:  [phone] Fax: [phone]  [facility] - [place], [place] - [address] [place] [address] Phone: [phone] Fax: [phone]     Social Determinants of Health (SDOH) Interventions    Readmission Risk Interventions Readmission Risk Prevention Plan 12/24/2018  Post Dischage Appt Complete  Medication Screening Complete  Transportation Screening Complete  Some recent data might be hidden

## 2019-03-04 NOTE — NC FL2 (Signed)
[place] MEDICAID FL2 LEVEL OF CARE SCREENING TOOL     IDENTIFICATION  Patient Name: Janice Little Birthdate: [birth year] Sex: female Admission Date (Current Location): 02/28/2019  [facility] and [place] Number:  Producer, television/film/video and Address:  [facility], [address] [place], [place] [address]      Provider Number: [id number]  Attending Physician Name and Address:  Vassie Loll, MD  Relative Name and Phone Number:  Thayer Ohm, son, [phone]    Current Level of Care: Hospital Recommended Level of Care: Skilled Nursing Facility Prior Approval Number:    Date Approved/Denied:   PASRR Number: [id number]  Discharge Plan: SNF    Current Diagnoses: Patient Active Problem List   Diagnosis Date Noted  . Acute lower UTI   . Stroke (HCC) 03/02/2019  . Acute CVA (cerebrovascular accident) (HCC) 03/01/2019  . Hypertensive urgency 12/19/2018  . History of myelitis 12/26/2017  . History of optic neuritis 04/02/2015  . Diplopia 04/02/2015  . Urinary incontinence 04/02/2015  . Other fatigue 04/02/2015  . 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 Numbness 04/02/2015  . Ataxic gait 04/02/2015  . Essential tremor 04/02/2015  . PRIMARY HYPERPARATHYROIDISM 02/01/2008  . OSTEOPOROSIS 02/01/2008  . Diabetes mellitus with diabetic neuropathy (HCC) 01/31/2008  . Hyperlipidemia 01/31/2008  . Multiple sclerosis (HCC) 01/31/2008  . Optic neuritis 01/31/2008  . Essential hypertension 01/31/2008  . GERD 01/31/2008    Orientation RESPIRATION BLADDER Height & Weight     Self, Time, Situation, Place  Normal Incontinent, External catheter Weight: 164 lb 0.4 oz (74.4 kg) Height:  5' (152.4 cm)  BEHAVIORAL SYMPTOMS/MOOD NEUROLOGICAL BOWEL NUTRITION STATUS      Continent Diet(Please see DC Summary)  AMBULATORY STATUS COMMUNICATION OF NEEDS Skin   Extensive Assist Verbally PU Stage and Appropriate Care(Stage II on buttocks)                       Personal Care Assistance Level of Assistance  Bathing, Feeding, Dressing Bathing Assistance: Maximum assistance Feeding assistance: Limited assistance Dressing Assistance: Limited assistance     Functional Limitations Info  Sight, Hearing, Speech Sight Info: Impaired Hearing Info: Adequate Speech Info: Adequate    SPECIAL CARE FACTORS FREQUENCY  PT (By licensed PT), OT (By licensed OT), Speech therapy     PT Frequency: 5x/week OT Frequency: 3x/week     Speech Therapy Frequency: 2x/week      Contractures Contractures Info: Not present    Additional Factors Info  Code Status, Allergies, Insulin Sliding Scale Code Status Info: Full Allergies Info: NKA   Insulin Sliding Scale Info: See dc summary for dose       Current Medications (03/04/2019):  This is the current hospital active medication list Current Facility-Administered Medications  Medication Dose Route Frequency Provider Last Rate Last Dose  . acetaminophen (TYLENOL) tablet 650 mg  650 mg Oral Q4H PRN Eduard Clos, MD   650 mg at 03/04/19 3903   Or  . acetaminophen (TYLENOL) solution 650 mg  650 mg Per Tube Q4H PRN Eduard Clos, MD       Or  . acetaminophen (TYLENOL) suppository 650 mg  650 mg Rectal Q4H PRN Eduard Clos, MD      . aspirin tablet 325 mg  325 mg Oral Daily Eduard Clos, MD   325 mg at 03/04/19 0810  . cefTRIAXone (ROCEPHIN) 1 g in sodium chloride 0.9 % 100 mL IVPB  1  g Intravenous Q24H Vassie Loll, MD 200 mL/hr at 03/04/19 0817 1 g at 03/04/19 0817  . clopidogrel (PLAVIX) tablet 75 mg  75 mg Oral Daily Rejeana Brock, MD   75 mg at 03/04/19 0809  . enoxaparin (LOVENOX) injection 40 mg  40 mg Subcutaneous Q24H Eduard Clos, MD   40 mg at 03/04/19 0810  . hydrALAZINE (APRESOLINE) injection 10 mg  10 mg Intravenous Q4H PRN Eduard Clos, MD      . hydrALAZINE (APRESOLINE) tablet 50 mg  50 mg Oral Q8H Marvel Plan, MD   50 mg  at 03/04/19 0509  . insulin aspart (novoLOG) injection 0-9 Units  0-9 Units Subcutaneous TID WC Eduard Clos, MD   1 Units at 03/04/19 0809  . lisinopril (ZESTRIL) tablet 20 mg  20 mg Oral Daily Marvel Plan, MD   20 mg at 03/04/19 0810  . ondansetron (ZOFRAN) injection 4 mg  4 mg Intravenous Q6H PRN Vassie Loll, MD   4 mg at 03/03/19 1129     Discharge Medications: Please see discharge summary for a list of discharge medications.  Relevant Imaging Results:  Relevant Lab Results:   Additional Information SSI: [id number]  Mearl Latin, LCSW

## 2019-03-05 ENCOUNTER — Encounter (HOSPITAL_COMMUNITY): Payer: Self-pay | Admitting: Internal Medicine

## 2019-03-05 ENCOUNTER — Encounter (HOSPITAL_COMMUNITY)
Admission: EM | Disposition: A | Discharge status: Skilled Nursing Facility | Payer: Self-pay | Source: Home / Self Care | Attending: Internal Medicine

## 2019-03-05 DIAGNOSIS — E1140 Type 2 diabetes mellitus with diabetic neuropathy, unspecified: Secondary | ICD-10-CM

## 2019-03-05 DIAGNOSIS — I6389 Other cerebral infarction: Secondary | ICD-10-CM

## 2019-03-05 DIAGNOSIS — N179 Acute kidney failure, unspecified: Secondary | ICD-10-CM

## 2019-03-05 HISTORY — PX: LOOP RECORDER INSERTION: EP1214

## 2019-03-05 LAB — GLUCOSE, CAPILLARY
Glucose-Capillary: 158 mg/dL — ABNORMAL HIGH (ref 70–99)
Glucose-Capillary: 177 mg/dL — ABNORMAL HIGH (ref 70–99)
Glucose-Capillary: 165 mg/dL — ABNORMAL HIGH (ref 70–99)
Glucose-Capillary: 131 mg/dL — ABNORMAL HIGH (ref 70–99)

## 2019-03-05 SURGERY — LOOP RECORDER INSERTION
Anesthesia: LOCAL

## 2019-03-05 MED ORDER — HYDRALAZINE HCL 50 MG PO TABS
75.0000 mg | ORAL_TABLET | Freq: Three times a day (TID) | ORAL | Status: DC
  Administered 2019-03-05 – 2019-03-06 (×4): 75 mg via ORAL
  Filled 2019-03-05 (×4): qty 1

## 2019-03-05 MED ORDER — HYDRALAZINE HCL 25 MG PO TABS: 75.0000 mg | ORAL_TABLET | Freq: Three times a day (TID) | ORAL | Status: AC

## 2019-03-05 MED ORDER — LISINOPRIL 30 MG PO TABS: 30.0000 mg | ORAL_TABLET | Freq: Every day | ORAL | Status: DC

## 2019-03-05 MED ORDER — ACETAMINOPHEN 325 MG PO TABS: 650.0000 mg | ORAL_TABLET | ORAL | Status: AC | PRN

## 2019-03-05 MED ORDER — LISINOPRIL 20 MG PO TABS
30.0000 mg | ORAL_TABLET | Freq: Every day | ORAL | Status: DC
  Administered 2019-03-05 – 2019-03-06 (×2): 30 mg via ORAL
  Filled 2019-03-05 (×2): qty 1

## 2019-03-05 MED ORDER — CEPHALEXIN 500 MG PO CAPS: 500.0000 mg | ORAL_CAPSULE | Freq: Two times a day (BID) | ORAL | Status: AC

## 2019-03-05 MED ORDER — METFORMIN HCL ER 500 MG PO TB24: 500.0000 mg | ORAL_TABLET | Freq: Two times a day (BID) | ORAL | Status: AC

## 2019-03-05 MED ORDER — ACETAMINOPHEN 325 MG PO TABS: 325.0000 mg | ORAL_TABLET | ORAL | Status: DC | PRN

## 2019-03-05 MED ORDER — LIDOCAINE-EPINEPHRINE 1 %-1:100000 IJ SOLN
INTRAMUSCULAR | Status: DC | PRN
  Administered 2019-03-05: 30 mL

## 2019-03-05 MED ORDER — LIDOCAINE-EPINEPHRINE 1 %-1:100000 IJ SOLN
INTRAMUSCULAR | Status: AC
  Filled 2019-03-05: qty 1

## 2019-03-05 MED ORDER — ONDANSETRON HCL 4 MG/2ML IJ SOLN: 4.0000 mg | Freq: Four times a day (QID) | INTRAMUSCULAR | Status: DC | PRN

## 2019-03-05 MED ORDER — CLOPIDOGREL BISULFATE 75 MG PO TABS: 75.0000 mg | ORAL_TABLET | Freq: Every day | ORAL | Status: AC

## 2019-03-05 MED ORDER — ASPIRIN 325 MG PO TABS: 325.0000 mg | ORAL_TABLET | Freq: Every day | ORAL | Status: AC

## 2019-03-05 SURGICAL SUPPLY — 2 items
LOOP REVEAL LINQSYS (Prosthesis & Implant Heart) ×2 IMPLANT
PACK LOOP INSERTION (CUSTOM PROCEDURE TRAY) ×2 IMPLANT

## 2019-03-05 NOTE — TOC Progression Note (Signed)
Transition of Care [facility]) - Progression Note    Patient Details  Name: VINNIE CARDOSA MRN: [id number] Date of Birth: 1946/06/05  Transition [facility]) CM/SW Contact  Mearl Latin, LCSW Phone Number: 03/05/2019, 3:51 PM  Clinical Narrative:    Per [facility], insurance is no longer allowing waivers for authorization, therefore they are still waiting on insurance approval. CSW will follow up tomorrow.    Expected Discharge Plan: Skilled Nursing Facility Barriers to Discharge: Continued Medical Work up  Expected Discharge Plan and Services Expected Discharge Plan: Skilled Nursing Facility In-house Referral: Clinical Social Work Discharge Planning Services: NA Post Acute Care Choice: Skilled Nursing Facility Living arrangements for the past 2 months: Apartment Expected Discharge Date: 03/05/19               DME Arranged: N/A DME Agency: NA       HH Arranged: NA HH Agency: NA         Social Determinants of Health (SDOH) Interventions    Readmission Risk Interventions Readmission Risk Prevention Plan 12/24/2018  Post Dischage Appt Complete  Medication Screening Complete  Transportation Screening Complete  Some recent data might be hidden

## 2019-03-05 NOTE — Progress Notes (Signed)
Occupational Therapy Treatment Patient Details Name: Janice Little MRN: [id number] DOB: 1945-10-13 Today's Date: 03/05/2019    History of present illness Pt is a 73 y/o female who presents from home s/p fall and head injury. Pt unable to determine how long she was down before family found her. MRI revealed an acute infarct right MCA territory as well as small areas of recent infarct in the left frontal parietal lobe likely subacute in nature.   OT comments  Pt progressing toward established goals. Pt currently requires maxA+2 for sit<>stand with use of Corene Cornea for functional mobility. Pt currently able to doff/don cap of lotion, squeeze lotion into hand and apply to RUE using LUE. Reviewed visual compensatory strategies with pt to scan the room and table/tray left to right using head turns. Pt able to identify objects in picture with maxA for use of visual scanning strategy. Pt will continue to benefit from skilled OT services to maximize safety and independence with ADL/IADL and functional mobility. Will continue to follow acutely.     Follow Up Recommendations  SNF;Supervision/Assistance - 24 hour    Equipment Recommendations  Other (comment)    Recommendations for Other Services      Precautions / Restrictions Precautions Precautions: Fall;Other (comment) Precaution Comments: L inattention Restrictions Weight Bearing Restrictions: No       Mobility Bed Mobility Overal bed mobility: Needs Assistance Bed Mobility: Rolling;Sidelying to Sit Rolling: Mod assist Sidelying to sit: HOB elevated;Mod assist       General bed mobility comments: cues for sequencing and use of rail to go toward R side; assist with R LE and hips to EOB and to elevate trunk into sitting  Transfers Overall transfer level: Needs assistance Equipment used: Rolling walker (2 wheeled);2 person hand held assist Transfers: Sit to/from Stand Sit to Stand: Max assist;+2 physical assistance;From elevated  surface         General transfer comment: assist to power up into standing and to maintain standing balance; pt with flexed posture; cues for sequencing    Balance Overall balance assessment: Needs assistance Sitting-balance support: Feet supported;Bilateral upper extremity supported Sitting balance-Leahy Scale: Poor Sitting balance - Comments: L lateral lean with difficulty adjusting hips for equal weight R and L Postural control: Left lateral lean Standing balance support: Bilateral upper extremity supported Standing balance-Leahy Scale: Zero Standing balance comment: Up to +2 max assist for standing without a RW.                            ADL either performed or assessed with clinical judgement   ADL Overall ADL's : Needs assistance/impaired Eating/Feeding: Maximal assistance;Sitting   Grooming: Sitting;Minimal assistance Grooming Details (indicate cue type and reason): pt able to open container and apply lotion with minA Upper Body Bathing: Moderate assistance   Lower Body Bathing: Maximal assistance;Total assistance;Sitting/lateral leans;Sit to/from stand   Upper Body Dressing : Maximal assistance;Standing;Sitting   Lower Body Dressing: Maximal assistance;Total assistance;Sitting/lateral leans;Sit to/from stand   Toilet Transfer: Total assistance Toilet Transfer Details (indicate cue type and reason): simulated with use of Huntley Dec stedy to transfer pt from EOB to recliner Toileting- Clothing Manipulation and Hygiene: Total assistance       Functional mobility during ADLs: Maximal assistance;+2 for physical assistance;+2 for safety/equipment General ADL Comments: pt required vc for initing use of LUE during ADL completion;educated pt on visual compensatory strategies, scanning L to right, scanning 4 corners of room/item/table etc to assist with  ADL completion       Vision   Vision Assessment?: Vision impaired- to be further tested in functional  context;Yes Ocular Range of Motion: Restricted on the left;Restricted looking up Alignment/Gaze Preference: Chin down;Head tilt;Gaze right Tracking/Visual Pursuits: Decreased smoothness of horizontal tracking;Decreased smoothness of vertical tracking;Impaired - to be further tested in functional context Saccades: Additional head turns occurred during testing;Undershoots;Decreased speed of saccadic movement Visual Fields: Left visual field deficit;Impaired-to be further tested in functional context Additional Comments: requires more visual therapy and assessments;pt able to identify images in picture using compensatory scanning strategy with maxA;pt requires tactile input to adjust head when scanning room left to right   Perception     Praxis      Cognition Arousal/Alertness: Awake/Little Behavior During Therapy: Flat affect Overall Cognitive Status: Impaired/Different from baseline Area of Impairment: Safety/judgement;Problem solving;Following commands                 Orientation Level: Disoriented to;Time Current Attention Level: Focused   Following Commands: Follows one step commands inconsistently;Follows one step commands with increased time Safety/Judgement: Decreased awareness of safety;Decreased awareness of deficits Awareness: Intellectual Problem Solving: Slow processing;Decreased initiation;Difficulty sequencing;Requires verbal cues;Requires tactile cues General Comments: L side inattention;requires multimodal cues to attend to task;        Exercises     Shoulder Instructions       General Comments Pt's incision site bleeding upon arrival, nsg notified and arrived to clean/dressing change;educated pt on general UE exerices for HEP    Pertinent Vitals/ Pain       Pain Assessment: No/denies pain  Home Living                                          Prior Functioning/Environment              Frequency  Min 3X/week        Progress  Toward Goals  OT Goals(current goals can now be found in the care plan section)  Progress towards OT goals: Progressing toward goals  Acute Rehab OT Goals Patient Stated Goal: to use the toilet  OT Goal Formulation: With patient Time For Goal Achievement: 03/17/19 Potential to Achieve Goals: Good ADL Goals Pt Will Perform Eating: with set-up;sitting Pt Will Perform Grooming: with set-up;standing Pt Will Perform Upper Body Dressing: with modified independence;sitting Pt Will Perform Lower Body Dressing: with set-up;sitting/lateral leans;sit to/from stand Pt Will Transfer to Toilet: with supervision;bedside commode Pt Will Perform Toileting - Clothing Manipulation and hygiene: with supervision;sitting/lateral leans Additional ADL Goal #1: Pt will perform ADL tasks x5 mins sitting EOB with fair balance and 0 cues for sitting upright with no R lateral lean  Plan Discharge plan remains appropriate    Co-evaluation    PT/OT/SLP Co-Evaluation/Treatment: Yes Reason for Co-Treatment: For patient/therapist safety;To address functional/ADL transfers   OT goals addressed during session: ADL's and self-care      AM-PAC OT "6 Clicks" Daily Activity     Outcome Measure   Help from another person eating meals?: A Lot Help from another person taking care of personal grooming?: A Lot Help from another person toileting, which includes using toliet, bedpan, or urinal?: A Lot Help from another person bathing (including washing, rinsing, drying)?: A Lot Help from another person to put on and taking off regular upper body clothing?: A Lot Help from another person to put on and  taking off regular lower body clothing?: A Lot 6 Click Score: 12    End of Session Equipment Utilized During Treatment: Gait belt  OT Visit Diagnosis: Unsteadiness on feet (R26.81);Muscle weakness (generalized) (M62.81)   Activity Tolerance Patient limited by fatigue;Patient limited by lethargy;Treatment limited  secondary to medical complications (Comment)   Patient Left in chair;with call bell/phone within reach;with chair alarm set   Nurse Communication Mobility status;Other (comment)(surgical site bleeding)        Time: 3329-5188 OT Time Calculation (min): 44 min  Charges: OT General Charges $OT Visit: 1 Visit OT Treatments $Self Care/Home Management : 8-22 mins $Therapeutic Exercise: 8-22 mins  Janice Little OTR/L Acute Rehabilitation Services Office: [phone]    Janice Little 03/05/2019, 1:37 PM

## 2019-03-05 NOTE — Progress Notes (Signed)
SLP Cancellation Note  Patient Details Name: Janice Little MRN: [id number] DOB: [birth year]   Cancelled treatment:       Reason Eval/Treat Not Completed: Patient at procedure or test/unavailable(Pt off unit at this time. SLP will re-attempt tx as able. )  Anel Purohit I. Vear Clock, MS, CCC-SLP Acute Rehabilitation Services Office number [phone] Pager [phone]  Scheryl Marten 03/05/2019, 11:00 AM

## 2019-03-05 NOTE — Discharge Summary (Addendum)
Physician Discharge Summary  Janice Little ZOX:[id number] DOB: Apr 30, 1946 DOA: 02/28/2019  PCP: Juluis Rainier, MD  Admit date: 02/28/2019 Discharge date: 03/06/2019  Time spent: 35 minutes  Recommendations for Outpatient Follow-up:  1. Check basic metabolic panel to follow electrolytes and renal function 2. Reassess blood pressure and further adjust antihypertensive regimen 3. Repeat lipid panel in 3 months with decision at that time of resuming statins if needed.   Discharge Diagnoses:  Principal Problem:   Acute CVA (cerebrovascular accident) (HCC)   Hyperlipidemia   Essential hypertension   History of optic neuritis   Essential tremor   History of myelitis   Acute lower UTI   AKI  Discharge Condition: Stable for discharge.  Patient will go to [facility] skilled nursing facility for further care and rehabilitation.  Outpatient follow-up with PCP and neurology.  Diet recommendation: Heart healthy diet, modified carbohydrates and low calorie.  Filed Weights   03/01/19 0251  Weight: 74.4 kg    History of present illness:  As per H&P written by Dr. Toniann Fail on 03/01/2019 73 y.o.femalewithhistory of hypertension, diabetes mellitus type 2, hyperlipidemia and multiple sclerosis was last seen by family on Monday 4 days ago was not able to be reached and so patient family called for wellness check and patient was found to be on the floor in her room. Patient appeared mildly confused but was following commands and was brought to the ER. Patient states she has fallen on Monday 4 days ago and not sure how she fell. States she has not taken her medications since her fall.  ED Course:In the ER on exam patient not able to move her eyes towards the left. And also has neglect on the left side and has mild weakness of the left upper and lower extremity. CT head was unremarkable. On-call neurologist Dr. Amada Jupiter was consulted and patient admitted for acute CVA. Blood  pressure is markedly elevated given the acute CVA will allow for permissive hypertension. Patient denies any chest pain shortness of breath or any pain around the extremities. EKG shows normal sinus rhythm. Labs show creatinine of 1.2 hemoglobin 14.3 WBC 13.2 platelets 288. CT of the C-spine was unremarkable chest x-ray was unremarkable.  Hospital Course:  1-acute CVA (cerebrovascular accident) [facility]) -With MRI demonstrating a scatted distal right posterior temporoparietal MCA infarct; there is also surrounding small infarct seen in left posterior parietal/frontal that are less intense on DWI. -CTA head and neck: Demonstrating right ICA proximal and bilateral siphon atherosclerosis.  R M2 cutoff in inferior deviation appreciated. -At this moment following neurology recommendations will use aspirin and Plavix for 70-month with subsequent use of full dose aspirin alone for secondary prevention. -Patient s/p loop recorder implantation on 03/05/19. -Following PT, OT and speech therapy recommendations discharge to SNF for further care and rehabilitation. -LDL 37 -A1c 5.6  2-essential hypertension -Stable currently, permissive hypertension in the setting of acute ischemia -Will resume the use of hydralazine and lisinopril at discharge -follow BP and further adjust antihypertensive regimen as needed.  3-history of multiple sclerosis/optic neuritis -Continue outpatient follow-up with Dr. Epimenio Foot at [facility]  4-upper lipidemia -LDL 37 -Will hold Lipitor for now given low LDL -LFTs within normal limits. -Outpatient follow-up of patient's lipid profile with further resumption of hypolipidemic medication as needed.  5-class I obesity -Low calorie diet, portion control and increase physical activity discussed with patient -Body mass index is 32.03 kg/m.  6-primary hyperparathyroidism -Status post surgery -Stable calcium level. -Continue to follow electrolytes trend as an outpatient.  7-physical  deconditioning/global left residual weakness -In the setting of residual deficit from stroke -Patient was found not a candidate for inpatient rehab; follow recommendation by PT/OT patient will be discharged to skilled nursing facility ([facility]) for further rehabilitation and care.  8-increase frequency and dysuria -Urinalysis with positive nitrites and elevated leukocyte esterase -Empiric treatment using Rocephin given for 2 days while inpatient; final culture pending at discharge.  Patient discharged on Keflex twice a day for 3 more days to complete antibiotic therapy. -Adjusted to maintain adequate hydration.  9-acute kidney injury -Patient creatinine on admission 1.2 -Suspected to be secondary to dehydration, UTI and continue use of nephrotoxic agents. -ACE inhibitors were discontinued on admission; fluid resuscitation provided and treatment empirically for UTI given. -Renal function back to normal at time of discharge -Safe to resume lisinopril again. -Check metabolic panel to follow electrolytes and renal function and stability  Procedures:  See below for x-ray reports  2D echo: Demonstrating preserved ejection fraction, impaired relaxation of ventricular walls suggesting diastolic dysfunction; no wall motion and normalities.  Mild increased left ventricular wall thickness.  No thrombi or significant valvular abnormalities appreciated.  Loop recorder implantation 03/05/2019  Consultations:  Cardiology  Neurology  Discharge Exam: Vitals:   03/06/19 0827 03/06/19 0829  BP: (!) 156/73 (!) 156/73  Pulse:  89  Resp:    Temp:    SpO2:  98%   General exam: Alert, awake, oriented x 3; sleepy today; no chest pain, no nausea, no vomiting.  Patient is slow to respond but appropriate.  Left hemianopsia and left-sided weakness appreciated.  No reports mild dysuria; no hematuria. Respiratory system: Clear to auscultation. Respiratory effort normal. Cardiovascular system:RRR. No  murmurs, rubs, gallops. Gastrointestinal system: Abdomen is nondistended, soft and nontender. No organomegaly or masses felt. Normal bowel sounds heard. Central nervous system: Alert and oriented.  No new focal neurological deficits.  Continued to have left-sided weakness, left hemianopsia residual expressive aphasia.   Extremities: No C/C/E, +pedal pulses Skin: No rashes, lesions or ulcers Psychiatry: Judgement and insight appear normal. Mood & affect appropriate.   Discharge Instructions   Discharge Instructions    Ambulatory referral to Neurology   Complete by:  As directed    An appointment is requested in approximately: 4 weeks.  Patient is Dr. Bonnita HollowSater's patient   Diet - low sodium heart healthy   Complete by:  As directed    Diet Carb Modified   Complete by:  As directed    Discharge instructions   Complete by:  As directed    Maintain adequate hydration Take medications as prescribed 3 more days of antibiotics to complete antibiotic therapy Physical therapy and rehabilitation as per skilled nursing facility protocol Follow instructions provided by electrophysiologist for wound care after implanted loop recorder.     Allergies as of 03/06/2019   No Known Allergies     Medication List    STOP taking these medications   atorvastatin 20 MG tablet Commonly known as:  LIPITOR   estradiol 0.5 MG tablet Commonly known as:  ESTRACE     TAKE these medications   acetaminophen 325 MG tablet Commonly known as:  TYLENOL Take 2 tablets (650 mg total) by mouth every 4 (four) hours as needed for mild pain (or temp > 37.5 C (99.5 F)).   aspirin 325 MG tablet Take 1 tablet (325 mg total) by mouth daily.   cephALEXin 500 MG capsule Commonly known as:  KEFLEX Take 1 capsule (500 mg total) by mouth  2 (two) times daily for 3 days.   clopidogrel 75 MG tablet Commonly known as:  PLAVIX Take 1 tablet (75 mg total) by mouth daily.   hydrALAZINE 25 MG tablet Commonly known as:   APRESOLINE Take 3 tablets (75 mg total) by mouth every 8 (eight) hours. What changed:    medication strength  how much to take  Another medication with the same name was removed. Continue taking this medication, and follow the directions you see here.   lisinopril 30 MG tablet Commonly known as:  ZESTRIL Take 1 tablet (30 mg total) by mouth daily. What changed:    medication strength  how much to take   metFORMIN 500 MG 24 hr tablet Commonly known as:  GLUCOPHAGE-XR Take 1 tablet (500 mg total) by mouth 2 (two) times a day. What changed:  when to take this   multivitamin capsule Take 1 capsule by mouth daily.   omeprazole 20 MG tablet Commonly known as:  PRILOSEC OTC Take 20 mg by mouth daily.   sitaGLIPtin 50 MG tablet Commonly known as:  JANUVIA Take 50 mg by mouth daily.   UNABLE TO FIND Incromega [id number]  (fish oils)      No Known Allergies  Contact information for follow-up providers    Sater, Pearletha Furl, MD. Schedule an appointment as soon as possible for a visit in 4 week(s).   Specialty:  Neurology Contact information: [address] [place] [address] [phone]        [facility] Office Follow up.   Specialty:  Cardiology Why:  03/19/2019 @ 10:00AM, wound check visit (heart monitor implant) Contact information: [address] [place] [address] [phone]           Contact information for after-discharge care    Destination    [facility] .   Service:  Skilled Nursing Contact information: [address] [place] [address] [phone]                  The results of significant diagnostics from this hospitalization (including imaging, microbiology, ancillary and laboratory) are listed below for reference.    Significant Diagnostic Studies: Ct Angio Head W Or Wo Contrast  Result Date: 03/01/2019 CLINICAL DATA:  Initial  evaluation for acute altered mental status, possible visual field cut. EXAM: CT ANGIOGRAPHY HEAD AND NECK CT PERFUSION BRAIN TECHNIQUE: Multidetector CT imaging of the head and neck was performed using the standard protocol during bolus administration of intravenous contrast. Multiplanar CT image reconstructions and MIPs were obtained to evaluate the vascular anatomy. Carotid stenosis measurements (when applicable) are obtained utilizing NASCET criteria, using the distal internal carotid diameter as the denominator. Multiphase CT imaging of the brain was performed following IV bolus contrast injection. Subsequent parametric perfusion maps were calculated using RAPID software. CONTRAST:  OMNIPAQUE IOHEXOL 350 MG/ML SOLN COMPARISON:  Prior CT from earlier the same evening. FINDINGS: CT HEAD FINDINGS Brain: Generalized age-related cerebral atrophy with moderate chronic small vessel ischemic disease. Multiple chronic lacunar infarcts noted within the bilateral basal ganglia and right thalamus. Extremely subtle loss of gray-white matter differentiation with evolving hypodensity seen at the posterior right temporal region (series 5, image 16), suspicious for of all vein posterior right MCA territory infarct. This correlates with area of ischemic penumbra on concomitant perfusion exam. Otherwise, gray-white matter differentiation maintained with no other acute large vessel territory infarct. No acute intracranial hemorrhage. No mass lesion, midline shift  or mass effect. No hydrocephalus. No extra-axial fluid collection. Vascular: Single focal hyperdensity involving a proximal right M2 branch, favored to reflect focal atherosclerotic plaque rather than thrombus (series 8, image 20). No other hyperdense vessel. Scattered vascular calcifications noted within the carotid siphons. Skull: Scalp soft tissues and calvarium within normal limits. Sinuses/Orbits: Globes and orbital soft tissues within normal limits. Paranasal  sinuses are largely clear. No mastoid effusion. Other: None. ASPECTS (Alberta Stroke Program Early CT Score) - Ganglionic level infarction (caudate, lentiform nuclei, internal capsule, insula, M1-M3 cortex): 6 - Supraganglionic infarction (M4-M6 cortex): 3 Total score (0-10 with 10 being normal): 9 Review of the MIP images confirms the above findings CTA NECK FINDINGS Aortic arch: Visualized aortic arch of normal caliber with normal 3 vessel morphology. Mild scattered atheromatous plaque about the arch and origin of the great vessels without hemodynamically significant stenosis. Visualized subclavian arteries widely patent. Right carotid system: Right common carotid artery patent from its origin to the bifurcation without stenosis. Mixed plaque about the right bifurcation/proximal right ICA without hemodynamically significant stenosis. Right ICA widely patent distally to the skull base without stenosis, dissection or occlusion. Left carotid system: Left common carotid artery patent from its origin to the bifurcation without stenosis. No significant atheromatous change or narrowing about the left bifurcation. Left ICA widely patent distally to the skull base without stenosis, dissection, or occlusion. Vertebral arteries: Both of the vertebral arteries arise from the subclavian arteries. Vertebral arteries widely patent within the neck without stenosis, dissection or occlusion. Right vertebral artery slightly dominant. Skeleton: No acute osseous finding. No discrete worrisome lytic or blastic osseous lesions. Eagle syndrome noted. Other neck: No other acute soft tissue abnormality about the neck. Scattered calcified sialoliths with multifocal stones at the right floor of mouth. Postsurgical changes noted at the left thyroid lobe. Upper chest: Visualized upper chest demonstrates no acute finding. Review of the MIP images confirms the above findings CTA HEAD FINDINGS Anterior circulation: Petrous segments patent  bilaterally. Mild atheromatous plaque within the carotid siphons without hemodynamically significant stenosis. A1 segments widely patent. Normal anterior communicating artery. Anterior cerebral arteries patent to their distal aspects without stenosis. Left M1 widely patent. Normal left MCA bifurcation. Distal left MCA branches well perfused. Right M1 widely patent. Normal right MCA bifurcation. Abrupt cut off of a proximal right M2 branch inferior division, favored to reflect a severe short-segment stenosis (series 11, image 97). Flow is seen distally, although is fairly attenuated. Superior division remains patent as does its distal branches. Posterior circulation: Vertebral arteries patent to the vertebrobasilar junction without stenosis. Posterior inferior cerebral arteries not well assessed on this examination. Basilar artery mildly diminutive but patent to its distal aspect without stenosis. Superior cerebral arteries patent bilaterally. Left PCA arises from the basilar. Fetal type origin of the right PCA. PCAs widely patent to their distal aspects without stenosis. Venous sinuses: Grossly patent allowing for timing of the contrast bolus. Anatomic variants: Fetal type right PCA. Delayed phase: Not performed. Review of the MIP images confirms the above findings CT Brain Perfusion Findings: ASPECTS: 9 CBF (<30%) Volume: 0mL Perfusion (Tmax>6.0s) volume: 18mL Mismatch Volume: 18mL Infarction Location:Negative CT perfusion for core infarct. 18 cc ischemic penumbra at the posterior right temporal lobe, posterior right MCA distribution. IMPRESSION: 1. Abrupt cut off of a proximal right M2 branch, inferior division, favored to reflect a short-segment severe high-grade stenosis. Flow is seen distally within the right MCA branches, although is attenuated and reduced. 2. Focal 18 cc ischemic penumbra at  the posterior right temporal lobe, corresponding with the right M2 severe stenosis. Small occult core infarct in this  region is suspected given the subtle early changes on the noncontrast head CT portion of this exam. 3. Mild atherosclerotic change about the aortic arch, right carotid bifurcation, and carotid siphons without hemodynamically significant stenosis. Critical Value/emergent results were called by telephone at the time of interpretation on 03/01/2019 at 1:26 am to Dr. Ritta Slot , who verbally acknowledged these results. Electronically Signed   By: Rise Mu M.D.   On: 03/01/2019 01:56   Dg Pelvis 1-2 Views  Result Date: 03/01/2019 CLINICAL DATA:  Recent fall. Unable to inverted left foot. Question stroke. EXAM: PELVIS - 1-2 VIEW COMPARISON:  None FINDINGS: Contrast fills the urinary bladder.  Bladder is unremarkable. Pelvis is intact.  Lower lumbar spine is within. Lucency is noted along the greater trochanter. This may represent an avulsion fracture. IMPRESSION: 1. Possible avulsion fracture along the greater trochanter. Recommend dedicated radiographs of the left hip further evaluation. 2. The pelvis is otherwise unremarkable. Electronically Signed   By: Marin Roberts M.D.   On: 03/01/2019 08:37   Ct Head Wo Contrast  Result Date: 03/01/2019 CLINICAL DATA:  Stroke follow-up.  Change in mental status. EXAM: CT HEAD WITHOUT CONTRAST TECHNIQUE: Contiguous axial images were obtained from the base of the skull through the vertex without intravenous contrast. COMPARISON:  03/01/2019 FINDINGS: Brain: Unchanged does appearance of multifocal chronic ischemic change, predominantly within the right hemisphere. Unchanged old right caudothalamic small vessel infarct. No intracranial hemorrhage. No midline shift or other mass effect. Unchanged size and configuration of ventricles. Vascular: No abnormal hyperdensity of the major intracranial arteries or dural venous sinuses. No intracranial atherosclerosis. Skull: The visualized skull base, calvarium and extracranial soft tissues are normal.  Sinuses/Orbits: No fluid levels or advanced mucosal thickening of the visualized paranasal sinuses. No mastoid or middle ear effusion. The orbits are normal. IMPRESSION: Unchanged examination with findings of chronic small vessel ischemia. No acute abnormality. Electronically Signed   By: Deatra Robinson M.D.   On: 03/01/2019 22:33   Ct Head Wo Contrast  Result Date: 02/28/2019 CLINICAL DATA:  Fall with hematoma to left side of head EXAM: CT HEAD WITHOUT CONTRAST CT CERVICAL SPINE WITHOUT CONTRAST TECHNIQUE: Multidetector CT imaging of the head and cervical spine was performed following the standard protocol without intravenous contrast. Multiplanar CT image reconstructions of the cervical spine were also generated. COMPARISON:  MRI 12/19/2018, CT brain 12/19/2018, MRI 04/17/2015 FINDINGS: CT HEAD FINDINGS Brain: No acute territorial infarction, hemorrhage or intracranial mass. Mild atrophy. Moderate small vessel ischemic changes of the white matter. 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 Stable ventricle size. Vascular: No hyperdense vessels.  Carotid vascular calcification Skull: Normal. Negative for fracture or focal lesion. Sinuses/Orbits: No acute finding. Other: None CT CERVICAL SPINE FINDINGS Alignment: No subluxation.  Facet alignment within normal limits Skull base and vertebrae: No acute fracture. No primary bone lesion or focal pathologic process. Soft tissues and spinal canal: No prevertebral fluid or swelling. No visible canal hematoma. Disc levels: Moderate degenerative changes at C4-C5, C5-C6 and C6-C7. Posterior disc osteophyte complex at multiple levels. Upper chest: Negative.  Mild carotid vascular calcification Other: None IMPRESSION: 1. No CT evidence for acute intracranial abnormality. Atrophy and small vessel ischemic changes of the white matter. 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 Degenerative  changes of the cervical spine. No acute  osseous abnormality. Electronically Signed   By: Jasmine Pang M.D.   On: 02/28/2019 23:16   Ct Angio Neck W Or Wo Contrast  Result Date: 03/01/2019 CLINICAL DATA:  Initial evaluation for acute altered mental status, possible visual field cut. EXAM: CT ANGIOGRAPHY HEAD AND NECK CT PERFUSION BRAIN TECHNIQUE: Multidetector CT imaging of the head and neck was performed using the standard protocol during bolus administration of intravenous contrast. Multiplanar CT image reconstructions and MIPs were obtained to evaluate the vascular anatomy. Carotid stenosis measurements (when applicable) are obtained utilizing NASCET criteria, using the distal internal carotid diameter as the denominator. Multiphase CT imaging of the brain was performed following IV bolus contrast injection. Subsequent parametric perfusion maps were calculated using RAPID software. CONTRAST:  OMNIPAQUE IOHEXOL 350 MG/ML SOLN COMPARISON:  Prior CT from earlier the same evening. FINDINGS: CT HEAD FINDINGS Brain: Generalized age-related cerebral atrophy with moderate chronic small vessel ischemic disease. Multiple chronic lacunar infarcts noted within the bilateral basal ganglia and right thalamus. Extremely subtle loss of gray-white matter differentiation with evolving hypodensity seen at the posterior right temporal region (series 5, image 16), suspicious for of all vein posterior right MCA territory infarct. This correlates with area of ischemic penumbra on concomitant perfusion exam. Otherwise, gray-white matter differentiation maintained with no other acute large vessel territory infarct. No acute intracranial hemorrhage. No mass lesion, midline shift or mass effect. No hydrocephalus. No extra-axial fluid collection. Vascular: Single focal hyperdensity involving a proximal right M2 branch, favored to reflect focal atherosclerotic plaque rather than thrombus (series 8, image 20). No other hyperdense  vessel. Scattered vascular calcifications noted within the carotid siphons. Skull: Scalp soft tissues and calvarium within normal limits. Sinuses/Orbits: Globes and orbital soft tissues within normal limits. Paranasal sinuses are largely clear. No mastoid effusion. Other: None. ASPECTS (Alberta Stroke Program Early CT Score) - Ganglionic level infarction (caudate, lentiform nuclei, internal capsule, insula, M1-M3 cortex): 6 - Supraganglionic infarction (M4-M6 cortex): 3 Total score (0-10 with 10 being normal): 9 Review of the MIP images confirms the above findings CTA NECK FINDINGS Aortic arch: Visualized aortic arch of normal caliber with normal 3 vessel morphology. Mild scattered atheromatous plaque about the arch and origin of the great vessels without hemodynamically significant stenosis. Visualized subclavian arteries widely patent. Right carotid system: Right common carotid artery patent from its origin to the bifurcation without stenosis. Mixed plaque about the right bifurcation/proximal right ICA without hemodynamically significant stenosis. Right ICA widely patent distally to the skull base without stenosis, dissection or occlusion. Left carotid system: Left common carotid artery patent from its origin to the bifurcation without stenosis. No significant atheromatous change or narrowing about the left bifurcation. Left ICA widely patent distally to the skull base without stenosis, dissection, or occlusion. Vertebral arteries: Both of the vertebral arteries arise from the subclavian arteries. Vertebral arteries widely patent within the neck without stenosis, dissection or occlusion. Right vertebral artery slightly dominant. Skeleton: No acute osseous finding. No discrete worrisome lytic or blastic osseous lesions. Eagle syndrome noted. Other neck: No other acute soft tissue abnormality about the neck. Scattered calcified sialoliths with multifocal stones at the right floor of mouth. Postsurgical changes noted  at the left thyroid lobe. Upper chest: Visualized upper chest demonstrates no acute finding. Review of the MIP images confirms the above findings CTA HEAD FINDINGS Anterior circulation: Petrous segments patent bilaterally. Mild atheromatous plaque within the carotid siphons without hemodynamically significant stenosis. A1 segments widely patent. Normal anterior communicating artery. Anterior cerebral arteries patent  to their distal aspects without stenosis. Left M1 widely patent. Normal left MCA bifurcation. Distal left MCA branches well perfused. Right M1 widely patent. Normal right MCA bifurcation. Abrupt cut off of a proximal right M2 branch inferior division, favored to reflect a severe short-segment stenosis (series 11, image 97). Flow is seen distally, although is fairly attenuated. Superior division remains patent as does its distal branches. Posterior circulation: Vertebral arteries patent to the vertebrobasilar junction without stenosis. Posterior inferior cerebral arteries not well assessed on this examination. Basilar artery mildly diminutive but patent to its distal aspect without stenosis. Superior cerebral arteries patent bilaterally. Left PCA arises from the basilar. Fetal type origin of the right PCA. PCAs widely patent to their distal aspects without stenosis. Venous sinuses: Grossly patent allowing for timing of the contrast bolus. Anatomic variants: Fetal type right PCA. Delayed phase: Not performed. Review of the MIP images confirms the above findings CT Brain Perfusion Findings: ASPECTS: 9 CBF (<30%) Volume: 49mL Perfusion (Tmax>6.0s) volume: 68mL Mismatch Volume: 72mL Infarction Location:Negative CT perfusion for core infarct. 18 cc ischemic penumbra at the posterior right temporal lobe, posterior right MCA distribution. IMPRESSION: 1. Abrupt cut off of a proximal right M2 branch, inferior division, favored to reflect a short-segment severe high-grade stenosis. Flow is seen distally within the  right MCA branches, although is attenuated and reduced. 2. Focal 18 cc ischemic penumbra at the posterior right temporal lobe, corresponding with the right M2 severe stenosis. Small occult core infarct in this region is suspected given the subtle early changes on the noncontrast head CT portion of this exam. 3. Mild atherosclerotic change about the aortic arch, right carotid bifurcation, and carotid siphons without hemodynamically significant stenosis. Critical Value/emergent results were called by telephone at the time of interpretation on 03/01/2019 at 1:26 am to Dr. Ritta Slot , who verbally acknowledged these results. Electronically Signed   By: Rise Mu M.D.   On: 03/01/2019 01:56   Ct Cervical Spine Wo Contrast  Result Date: 02/28/2019 CLINICAL DATA:  Fall with hematoma to left side of head EXAM: CT HEAD WITHOUT CONTRAST CT CERVICAL SPINE WITHOUT CONTRAST TECHNIQUE: Multidetector CT imaging of the head and cervical spine was performed following the standard protocol without intravenous contrast. Multiplanar CT image reconstructions of the cervical spine were also generated. COMPARISON:  MRI 12/19/2018, CT brain 12/19/2018, MRI 04/17/2015 FINDINGS: CT HEAD FINDINGS Brain: No acute territorial infarction, hemorrhage or intracranial mass. Mild atrophy. Moderate small vessel ischemic changes of the white matter. 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 Stable ventricle size. Vascular: No hyperdense vessels.  Carotid vascular calcification Skull: Normal. Negative for fracture or focal lesion. Sinuses/Orbits: No acute finding. Other: None CT CERVICAL SPINE FINDINGS Alignment: No subluxation.  Facet alignment within normal limits Skull base and vertebrae: No acute fracture. No primary bone lesion or focal pathologic process. Soft tissues and spinal canal: No prevertebral fluid or swelling. No visible canal hematoma. Disc levels: Moderate  degenerative changes at C4-C5, C5-C6 and C6-C7. Posterior disc osteophyte complex at multiple levels. Upper chest: Negative.  Mild carotid vascular calcification Other: None IMPRESSION: 1. No CT evidence for acute intracranial abnormality. Atrophy and small vessel ischemic changes of the white matter. 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 Degenerative changes of the cervical spine. No acute osseous abnormality. Electronically Signed   By: Jasmine Pang M.D.   On: 02/28/2019 23:16   Mr Brain Wo Contrast  Result Date:  03/01/2019 CLINICAL DATA:  Stroke. History of diabetes, hyperlipidemia, hypertension. History of multiple sclerosis. EXAM: MRI HEAD WITHOUT CONTRAST TECHNIQUE: Multiplanar, multiecho pulse sequences of the brain and surrounding structures were obtained without intravenous contrast. COMPARISON:  CTA 03/01/2019.  MRI head 12/19/2018 FINDINGS: Brain: Acute infarct in the right posterior temporoparietal lobe. Multiple small patchy areas of restricted diffusion are seen in this territory. Occlusion of right M2 branch noted on CTA. Small areas of acute or subacute infarct in the left posterior parietal lobe and left frontal lobe over the convexity. Ventricle size is normal. Mild atrophy. Extensive changes throughout the periventricular deep white matter bilaterally. Chronic lacunar infarctions in the basal ganglia bilaterally brainstem and cerebellum intact. Several areas of chronic microhemorrhage in the brain. Negative for mass or edema. Vascular: Normal arterial flow voids Skull and upper cervical spine: Negative Sinuses/Orbits: Negative Other: None IMPRESSION: Acute infarct right MCA territory corresponding to right M2 occlusion on CTA. This involves the posterior temporoparietal lobe. Small areas of recent infarct in the left frontal parietal lobe likely subacute nature. Possible emboli. Advanced chronic white matter changes. This is likely due to a combination of  chronic microvascular ischemia and multiple sclerosis. Electronically Signed   By: Marlan Palau M.D.   On: 03/01/2019 08:04   Ct Cerebral Perfusion W Contrast  Result Date: 03/01/2019 CLINICAL DATA:  Initial evaluation for acute altered mental status, possible visual field cut. EXAM: CT ANGIOGRAPHY HEAD AND NECK CT PERFUSION BRAIN TECHNIQUE: Multidetector CT imaging of the head and neck was performed using the standard protocol during bolus administration of intravenous contrast. Multiplanar CT image reconstructions and MIPs were obtained to evaluate the vascular anatomy. Carotid stenosis measurements (when applicable) are obtained utilizing NASCET criteria, using the distal internal carotid diameter as the denominator. Multiphase CT imaging of the brain was performed following IV bolus contrast injection. Subsequent parametric perfusion maps were calculated using RAPID software. CONTRAST:  OMNIPAQUE IOHEXOL 350 MG/ML SOLN COMPARISON:  Prior CT from earlier the same evening. FINDINGS: CT HEAD FINDINGS Brain: Generalized age-related cerebral atrophy with moderate chronic small vessel ischemic disease. Multiple chronic lacunar infarcts noted within the bilateral basal ganglia and right thalamus. Extremely subtle loss of gray-white matter differentiation with evolving hypodensity seen at the posterior right temporal region (series 5, image 16), suspicious for of all vein posterior right MCA territory infarct. This correlates with area of ischemic penumbra on concomitant perfusion exam. Otherwise, gray-white matter differentiation maintained with no other acute large vessel territory infarct. No acute intracranial hemorrhage. No mass lesion, midline shift or mass effect. No hydrocephalus. No extra-axial fluid collection. Vascular: Single focal hyperdensity involving a proximal right M2 branch, favored to reflect focal atherosclerotic plaque rather than thrombus (series 8, image 20). No other hyperdense  vessel. Scattered vascular calcifications noted within the carotid siphons. Skull: Scalp soft tissues and calvarium within normal limits. Sinuses/Orbits: Globes and orbital soft tissues within normal limits. Paranasal sinuses are largely clear. No mastoid effusion. Other: None. ASPECTS ([place] Stroke Program Early CT Score) - Ganglionic level infarction (caudate, lentiform nuclei, internal capsule, insula, M1-M3 cortex): 6 - Supraganglionic infarction (M4-M6 cortex): 3 Total score (0-10 with 10 being normal): 9 Review of the MIP images confirms the above findings CTA NECK FINDINGS Aortic arch: Visualized aortic arch of normal caliber with normal 3 vessel morphology. Mild scattered atheromatous plaque about the arch and origin of the great vessels without hemodynamically significant stenosis. Visualized subclavian arteries widely patent. Right carotid system: Right common carotid artery patent from its  origin to the bifurcation without stenosis. Mixed plaque about the right bifurcation/proximal right ICA without hemodynamically significant stenosis. Right ICA widely patent distally to the skull base without stenosis, dissection or occlusion. Left carotid system: Left common carotid artery patent from its origin to the bifurcation without stenosis. No significant atheromatous change or narrowing about the left bifurcation. Left ICA widely patent distally to the skull base without stenosis, dissection, or occlusion. Vertebral arteries: Both of the vertebral arteries arise from the subclavian arteries. Vertebral arteries widely patent within the neck without stenosis, dissection or occlusion. Right vertebral artery slightly dominant. Skeleton: No acute osseous finding. No discrete worrisome lytic or blastic osseous lesions. Eagle syndrome noted. Other neck: No other acute soft tissue abnormality about the neck. Scattered calcified sialoliths with multifocal stones at the right floor of mouth. Postsurgical changes noted  at the left thyroid lobe. Upper chest: Visualized upper chest demonstrates no acute finding. Review of the MIP images confirms the above findings CTA HEAD FINDINGS Anterior circulation: Petrous segments patent bilaterally. Mild atheromatous plaque within the carotid siphons without hemodynamically significant stenosis. A1 segments widely patent. Normal anterior communicating artery. Anterior cerebral arteries patent to their distal aspects without stenosis. Left M1 widely patent. Normal left MCA bifurcation. Distal left MCA branches well perfused. Right M1 widely patent. Normal right MCA bifurcation. Abrupt cut off of a proximal right M2 branch inferior division, favored to reflect a severe short-segment stenosis (series 11, image 97). Flow is seen distally, although is fairly attenuated. Superior division remains patent as does its distal branches. Posterior circulation: Vertebral arteries patent to the vertebrobasilar junction without stenosis. Posterior inferior cerebral arteries not well assessed on this examination. Basilar artery mildly diminutive but patent to its distal aspect without stenosis. Superior cerebral arteries patent bilaterally. Left PCA arises from the basilar. Fetal type origin of the right PCA. PCAs widely patent to their distal aspects without stenosis. Venous sinuses: Grossly patent allowing for timing of the contrast bolus. Anatomic variants: Fetal type right PCA. Delayed phase: Not performed. Review of the MIP images confirms the above findings CT Brain Perfusion Findings: ASPECTS: 9 CBF (<30%) Volume: 0mL Perfusion (Tmax>6.0s) volume: 18mL Mismatch Volume: 18mL Infarction Location:Negative CT perfusion for core infarct. 18 cc ischemic penumbra at the posterior right temporal lobe, posterior right MCA distribution. IMPRESSION: 1. Abrupt cut off of a proximal right M2 branch, inferior division, favored to reflect a short-segment severe high-grade stenosis. Flow is seen distally within the  right MCA branches, although is attenuated and reduced. 2. Focal 18 cc ischemic penumbra at the posterior right temporal lobe, corresponding with the right M2 severe stenosis. Small occult core infarct in this region is suspected given the subtle early changes on the noncontrast head CT portion of this exam. 3. Mild atherosclerotic change about the aortic arch, right carotid bifurcation, and carotid siphons without hemodynamically significant stenosis. Critical Value/emergent results were called by telephone at the time of interpretation on 03/01/2019 at 1:26 am to Dr. Ritta Slot , who verbally acknowledged these results. Electronically Signed   By: Rise Mu M.D.   On: 03/01/2019 01:56   Dg Chest Portable 1 View  Result Date: 02/28/2019 CLINICAL DATA:  Altered mental status EXAM: PORTABLE CHEST 1 VIEW COMPARISON:  12/19/2018 FINDINGS: No acute opacity or pleural effusion. Mild scarring at the left base. Normal heart size. No pneumothorax. IMPRESSION: No active disease. Electronically Signed   By: Jasmine Pang M.D.   On: 02/28/2019 22:52   Vas [place] Lower Extremity Venous (dvt)  Result Date: 03/02/2019  Lower Venous Study Indications: Stroke.  Comparison Study: No prior study on file for comparison. Performing Technologist: Sherren Kerns RVS  Examination Guidelines: A complete evaluation includes B-mode imaging, spectral Doppler, color Doppler, and power Doppler as needed of all accessible portions of each vessel. Bilateral testing is considered an integral part of a complete examination. Limited examinations for reoccurring indications may be performed as noted.  +---------+---------------+---------+-----------+----------+-------+ RIGHT    CompressibilityPhasicitySpontaneityPropertiesSummary +---------+---------------+---------+-----------+----------+-------+ CFV      Full           Yes      Yes                           +---------+---------------+---------+-----------+----------+-------+ SFJ      Full                                                 +---------+---------------+---------+-----------+----------+-------+ FV Prox  Full                                                 +---------+---------------+---------+-----------+----------+-------+ FV Mid   Full                                                 +---------+---------------+---------+-----------+----------+-------+ FV DistalFull                                                 +---------+---------------+---------+-----------+----------+-------+ PFV      Full                                                 +---------+---------------+---------+-----------+----------+-------+ POP      Full           Yes      Yes                          +---------+---------------+---------+-----------+----------+-------+ PTV      Full                                                 +---------+---------------+---------+-----------+----------+-------+ PERO     Full                                                 +---------+---------------+---------+-----------+----------+-------+   +---------+---------------+---------+-----------+----------+-------+ LEFT     CompressibilityPhasicitySpontaneityPropertiesSummary +---------+---------------+---------+-----------+----------+-------+ CFV      Full           Yes      Yes                          +---------+---------------+---------+-----------+----------+-------+  SFJ      Full                                                 +---------+---------------+---------+-----------+----------+-------+ FV Prox  Full                                                 +---------+---------------+---------+-----------+----------+-------+ FV Mid   Full                                                 +---------+---------------+---------+-----------+----------+-------+ FV DistalFull                                                  +---------+---------------+---------+-----------+----------+-------+ PFV      Full                                                 +---------+---------------+---------+-----------+----------+-------+ POP      Full           Yes      Yes                          +---------+---------------+---------+-----------+----------+-------+ PTV      Full                                                 +---------+---------------+---------+-----------+----------+-------+ PERO     Full                                                 +---------+---------------+---------+-----------+----------+-------+     Summary: Right: There is no evidence of deep vein thrombosis in the lower extremity. Left: There is no evidence of deep vein thrombosis in the lower extremity.  *See table(s) above for measurements and observations. Electronically signed by Sherald Hess MD on 03/02/2019 at 4:35:56 PM.    Final     Microbiology: Recent Results (from the past 240 hour(s))  SARS Coronavirus 2 (CEPHEID - Performed in [facility] hospital lab), Hosp Order     Status: None   Collection Time: 02/28/19 11:38 PM  Result Value Ref Range Status   SARS Coronavirus 2 NEGATIVE NEGATIVE Final    Comment: (NOTE) If result is NEGATIVE SARS-CoV-2 target nucleic acids are NOT DETECTED. The SARS-CoV-2 RNA is generally detectable in upper and lower  respiratory specimens during the acute phase of infection. The lowest  concentration of SARS-CoV-2 viral copies this assay can detect is 250  copies / mL. A negative result does not preclude SARS-CoV-2 infection  and should not be used as the sole basis for treatment or other  patient management decisions.  A negative result may occur with  improper specimen collection / handling, submission of specimen other  than nasopharyngeal swab, presence of viral mutation(s) within the  areas targeted by this assay, and  inadequate number of viral copies  (<250 copies / mL). A negative result must be combined with clinical  observations, patient history, and epidemiological information. If result is POSITIVE SARS-CoV-2 target nucleic acids are DETECTED. The SARS-CoV-2 RNA is generally detectable in upper and lower  respiratory specimens dur ing the acute phase of infection.  Positive  results are indicative of active infection with SARS-CoV-2.  Clinical  correlation with patient history and other diagnostic information is  necessary to determine patient infection status.  Positive results do  not rule out bacterial infection or co-infection with other viruses. If result is PRESUMPTIVE POSTIVE SARS-CoV-2 nucleic acids MAY BE PRESENT.   A presumptive positive result was obtained on the submitted specimen  and confirmed on repeat testing.  While 2019 novel coronavirus  (SARS-CoV-2) nucleic acids may be present in the submitted sample  additional confirmatory testing may be necessary for epidemiological  and / or clinical management purposes  to differentiate between  SARS-CoV-2 and other Sarbecovirus currently known to infect humans.  If clinically indicated additional testing with an alternate test  methodology [phone]) is advised. The SARS-CoV-2 RNA is generally  detectable in upper and lower respiratory sp ecimens during the acute  phase of infection. The expected result is Negative. Fact Sheet for Patients:  BoilerBrush.com.cy Fact Sheet for Healthcare Providers: https://pope.com/ This test is not yet approved or cleared by the [place] FDA and has been authorized for detection and/or diagnosis of SARS-CoV-2 by FDA under an Emergency Use Authorization (EUA).  This EUA will remain in effect (meaning this test can be used) for the duration of the COVID-19 declaration under Section 564(b)(1) of the Act, 21 U.S.C. section 360bbb-3(b)(1), unless the  authorization is terminated or revoked sooner. Performed at [facility], [address] [place], [place] [address]      Labs: Basic Metabolic Panel: Recent Labs  Lab 02/28/19 2210 03/01/19 0247 03/04/19 0537  NA 141 139 142  K 3.9 3.9 4.2  CL 106 99 111  CO2 18* 20* 21*  GLUCOSE 125* 106* 161*  BUN 22 21 29*  CREATININE 1.20* 1.10* 1.00  CALCIUM 9.3 9.3 8.7*   Liver Function Tests: Recent Labs  Lab 02/28/19 2210 03/01/19 0247  AST 34 29  ALT 20 19  ALKPHOS 96 90  BILITOT 1.1 1.2  PROT 6.1* 5.7*  ALBUMIN 3.7 3.5   CBC: Recent Labs  Lab 02/28/19 2210 03/01/19 0247 03/04/19 0537  WBC 13.2* 12.8* 6.9  NEUTROABS 10.7*  --   --   HGB 14.3 13.8 11.1*  HCT 43.4 41.5 34.2*  MCV 85.8 84.5 86.4  PLT 288 291 266   Cardiac Enzymes: Recent Labs  Lab 02/28/19 2210  CKTOTAL 364*  TROPONINI <0.03   CBG: Recent Labs  Lab 03/05/19 0804 03/05/19 1200 03/05/19 1711 03/05/19 2205 03/06/19 0739  GLUCAP 158* 177* 165* 131* 140*    Signed:  Rickey Barbara MD.  [facility] 03/06/2019, 11:03 AM

## 2019-03-05 NOTE — Interval H&P Note (Signed)
History and Physical Interval Note:  03/05/2019 9:45 AM  Janice Little  has presented today for surgery, with the diagnosis of Stroke.  The various methods of treatment have been discussed with the patient and family. After consideration of risks, benefits and other options for treatment, the patient has consented to  Procedure(s): LOOP RECORDER INSERTION (N/A) as a surgical intervention.  The patient's history has been reviewed, patient examined, no change in status, stable for surgery.  I have reviewed the patient's chart and labs.  Questions were answered to the patient's satisfaction.     Lewayne Bunting

## 2019-03-05 NOTE — Progress Notes (Addendum)
Telemetry reviewed this AM, no AFib/arrhythmias Neurology signed off CIR eval noted and not felt candidate and planned for SNF Social work noted, bed placement process is in progress I do not see any other pending issues for discharge  Will plan for loop today  I re-visited with the patient, she remains agreeable to implant.   Discussed wound care with her and will be placed in her AVS for SNF as well.  EP follow up will be arranged  Renee Ursuy, PA-C  EP Attending  Patient seen and examined. Agree with above. The patient has had a cryptogenic stroke. I have reviewed the findings and recommend we proceed with insertion of an ILR. The risks/benefits/goals/expectations of the procedure were reviewed and she wishes to proceed.   Gregg Taylor,M.D. 

## 2019-03-05 NOTE — Progress Notes (Signed)
  Speech Language Pathology Treatment: Cognitive-Linquistic  Patient Details Name: Janice Little MRN: [id number] DOB: 07-15-1946 Today's Date: 03/05/2019 Time: 6712-4580 SLP Time Calculation (min) (ACUTE ONLY): 33 min  Assessment / Plan / Recommendation Clinical Impression  Pt was seen for cognitive-linguistic treatment session and was cooperative throughout the session. She reported that believes that she does not have some difficulty with regards to memory but was unsure as to whether this is her baseline. She demonstrated 80% accuracy with 4-item immediate recall increasing to 100% accuracy with min. cues. With recall of 5 items she achieved 20% accuracy increasing to 100% accuracy with mod cues for recall of the fifth item. She completed an abstract reasoning task with 40% accuracy increasing to 100% accuracy with mod cues. She demonstrated 60% accuracy with a three-word sequencing mental manipulation task increasing to 100% accuracy with mod cues. She completed time word problems with 60% accuracy increasing to 100% accuracy with mod cues and achieved 25% accuracy with a medication management (prescription) task increasing to 75% accuracy with mod-max cues. SLP will continue to follow.    HPI HPI: [name] with PMH: DM-2, HTN, hyperlipidemia, MS, who presents from home s/p fall and head injury. Pt unable to determine how long she was down before family found her. MRI revealed an acute infarct right MCA territory as well as small areas of recent infarct in the left frontal parietal lobe likely subacute in nature.      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 Vear Clock, MS, CCC-SLP Acute Rehabilitation  Services Office number [phone] Pager [phone]                 Scheryl Marten 03/05/2019, 5:28 PM

## 2019-03-05 NOTE — Progress Notes (Addendum)
Physical Therapy Treatment Patient Details Name: Janice NimsCarol T Pinney MRN: [id number] DOB: 12/22/1945 Today's Date: 03/05/2019    History of Present Illness Pt is a 73 y/o female who presents from home s/p fall and head injury. Pt unable to determine how long she was down before family found her. MRI revealed an acute infarct right MCA territory as well as small areas of recent infarct in the left frontal parietal lobe likely subacute in nature.    PT Comments    Patient seen for mobility progression. This session focused on sitting balance EOB and functional transfers. Pt requires max A +2 to stand utilizing Stedy standing frame. Continue to progress as tolerated with anticipated d/c to SNF for further skilled PT services.      Follow Up Recommendations  SNF;Supervision/Assistance - 24 hour     Equipment Recommendations  None recommended by PT    Recommendations for Other Services       Precautions / Restrictions Precautions Precautions: Fall;Other (comment) Precaution Comments: L inattention Restrictions Weight Bearing Restrictions: No    Mobility  Bed Mobility Overal bed mobility: Needs Assistance Bed Mobility: Rolling;Sidelying to Sit Rolling: Mod assist Sidelying to sit: HOB elevated;Mod assist       General bed mobility comments: cues for sequencing and use of rail to go toward R side; assist with R LE and hips to EOB and to elevate trunk into sitting  Transfers Overall transfer level: Needs assistance Equipment used: Rolling walker (2 wheeled);2 person hand held assist Transfers: Sit to/from Stand Sit to Stand: Max assist;+2 physical assistance;From elevated surface         General transfer comment: assist to power up into standing and to maintain standing balance; pt with flexed posture; cues for sequencing  Ambulation/Gait                 Stairs             Wheelchair Mobility    Modified Rankin (Stroke Patients Only)       Balance  Overall balance assessment: Needs assistance Sitting-balance support: Feet supported;Bilateral upper extremity supported Sitting balance-Leahy Scale: Poor Sitting balance - Comments: worked on sitting balance EOB with multimodal cues to come to midline; L lateral lean with difficulty adjusting hips for equal weight R and L Postural control: Left lateral lean Standing balance support: Bilateral upper extremity supported Standing balance-Leahy Scale: Zero Standing balance comment: Up to +2 max assist for standing; assist required for hip extension and upright posture                             Cognition Arousal/Alertness: Awake/alert Behavior During Therapy: Flat affect Overall Cognitive Status: Impaired/Different from baseline Area of Impairment: Safety/judgement;Problem solving;Following commands                 Orientation Level: Disoriented to;Time Current Attention Level: Focused   Following Commands: Follows one step commands inconsistently;Follows one step commands with increased time Safety/Judgement: Decreased awareness of safety;Decreased awareness of deficits Awareness: Intellectual Problem Solving: Slow processing;Decreased initiation;Difficulty sequencing;Requires verbal cues;Requires tactile cues General Comments: L side inattention;requires multimodal cues to attend to task;      Exercises      General Comments General comments (skin integrity, edema, etc.): pt's incision site (loop recorder) bleeding upon arrival. RN notified and cleaned/changed dressing while pt EOB      Pertinent Vitals/Pain Pain Assessment: No/denies pain    Home Living  Prior Function            PT Goals (current goals can now be found in the care plan section) Acute Rehab PT Goals Patient Stated Goal: to use the toilet  Progress towards PT goals: Progressing toward goals    Frequency    Min 3X/week      PT Plan Current plan  remains appropriate    Co-evaluation PT/OT/SLP Co-Evaluation/Treatment: Yes Reason for Co-Treatment: For patient/therapist safety;To address functional/ADL transfers PT goals addressed during session: Mobility/safety with mobility;Balance OT goals addressed during session: ADL's and self-care      AM-PAC PT "6 Clicks" Mobility   Outcome Measure  Help needed turning from your back to your side while in a flat bed without using bedrails?: A Lot Help needed moving from lying on your back to sitting on the side of a flat bed without using bedrails?: A Lot Help needed moving to and from a bed to a chair (including a wheelchair)?: A Lot Help needed standing up from a chair using your arms (e.g., wheelchair or bedside chair)?: A Lot Help needed to walk in hospital room?: Total Help needed climbing 3-5 steps with a railing? : Total 6 Click Score: 10    End of Session Equipment Utilized During Treatment: Gait belt Activity Tolerance: Patient tolerated treatment well Patient left: in chair;with call bell/phone within reach;with chair alarm set Nurse Communication: Mobility status PT Visit Diagnosis: Unsteadiness on feet (R26.81);Other abnormalities of gait and mobility (R26.89);Muscle weakness (generalized) (M62.81);History of falling (Z91.81);Other symptoms and signs involving the nervous system (R29.898);Hemiplegia and hemiparesis Hemiplegia - Right/Left: Left Hemiplegia - caused by: Cerebral infarction     Time: 1226-1300 PT Time Calculation (min) (ACUTE ONLY): 32 min  Charges:  $Gait Training: 8-22 mins                     Erline Levine, PTA Acute Rehabilitation Services Pager: [phone] Office: [phone]     Carolynne Edouard 03/05/2019, 4:38 PM

## 2019-03-05 NOTE — Discharge Instructions (Signed)
Implant site/wound care instructions °Keep incision clean and dry for 3 days. °You can remove outer dressing tomorrow. °Leave steri-strips (little pieces of tape) on until seen in the office for wound check appointment. °Call the office ([phone]) for redness, drainage, swelling, or fever. ° °

## 2019-03-05 NOTE — TOC Progression Note (Signed)
Transition of Care [facility]) - Progression Note    Patient Details  Name: Janice Little MRN: [id number] Date of Birth: [birth year]  Transition of Care [facility]) CM/SW Contact  Mearl Latin, LCSW Phone Number: 03/05/2019, 9:33 AM  Clinical Narrative:    [facility] is starting insurance approval.   Expected Discharge Plan: Skilled Nursing Facility Barriers to Discharge: Continued Medical Work up  Expected Discharge Plan and Services Expected Discharge Plan: Skilled Nursing Facility In-house Referral: Clinical Social Work Discharge Planning Services: NA Post Acute Care Choice: Skilled Nursing Facility Living arrangements for the past 2 months: Apartment                 DME Arranged: N/A DME Agency: NA       HH Arranged: NA HH Agency: NA         Social Determinants of Health (SDOH) Interventions    Readmission Risk Interventions Readmission Risk Prevention Plan 12/24/2018  Post Dischage Appt Complete  Medication Screening Complete  Transportation Screening Complete  Some recent data might be hidden

## 2019-03-05 NOTE — H&P (View-Only) (Signed)
Telemetry reviewed this AM, no AFib/arrhythmias Neurology signed off CIR eval noted and not felt candidate and planned for SNF Social work noted, bed placement process is in progress I do not see any other pending issues for discharge  Will plan for loop today  I re-visited with the patient, she remains agreeable to implant.   Discussed wound care with her and will be placed in her AVS for SNF as well.  EP follow up will be arranged  Francis Dowse, PA-C  EP Attending  Patient seen and examined. Agree with above. The patient has had a cryptogenic stroke. I have reviewed the findings and recommend we proceed with insertion of an ILR. The risks/benefits/goals/expectations of the procedure were reviewed and she wishes to proceed.   Janice Little.D.

## 2019-03-06 DIAGNOSIS — R4182 Altered mental status, unspecified: Secondary | ICD-10-CM

## 2019-03-06 LAB — GLUCOSE, CAPILLARY
Glucose-Capillary: 140 mg/dL — ABNORMAL HIGH (ref 70–99)
Glucose-Capillary: 286 mg/dL — ABNORMAL HIGH (ref 70–99)

## 2019-03-06 NOTE — Progress Notes (Signed)
PROGRESS NOTE    Dellia NimsCarol T Garlitz  ZOX:[id number] DOB: 03/05/1946 DOA: 02/28/2019 PCP: Juluis RainierBarnes, Elizabeth, MD    Assessment & Plan:   Principal Problem:   Acute CVA (cerebrovascular accident) [facility](HCC) Active Problems:   Hyperlipidemia   Essential hypertension   History of optic neuritis   Essential tremor   History of myelitis   Hypertensive urgency   Stroke (HCC)   Acute lower UTI   Pressure injury of skin  1-acute CVA (cerebrovascular accident) (HCC) -With MRI demonstrating a scatted distal right posterior temporoparietal MCA infarct; there is also surrounding small infarct seen in left posterior parietal/frontal that are less intense on DWI. -CTA head and neck: Demonstrating right ICA proximal and bilateral siphon atherosclerosis. R M2 cutoff in inferior deviation appreciated. -At this moment following neurology recommendations will use aspirin and Plavix for 6927-month with subsequent use of full dose aspirin alone for secondary prevention. -Patient s/p loop recorder implantation on 03/05/19. -Following PT, OT and speech therapy recommendations discharge to SNF today  2-essential hypertension -Stable currently, permissive hypertension in the setting of acute ischemia -Will resume the use of hydralazine and lisinopril at discharge  3-history of multiple sclerosis/optic neuritis -[facility]  4-upper lipidemia -LDL 37 -Will hold Lipitor for now given low LDL -LFTs within normal limits. -Outpatient follow-up of patient's lipid profile with further resumption of hypolipidemic medication as needed.  5-class I obesity -Low calorie diet, portion control and increase physical activity discussed with patient -Body mass index is 32.03 kg/m.  6-primary hyperparathyroidism -Status post surgery -Stable calcium level. -Continue to follow electrolytes trend as an outpatient.  7-physical deconditioning/global left residual weakness -In  the setting of residual deficit from stroke -Patient was found not a candidate for inpatient rehab; follow recommendation by PT/OT patient will be discharged to skilled nursing facility ([facility]) for further rehabilitation and care.  8-increase frequency and dysuria -Urinalysis with positive nitrites and elevated leukocyte esterase -Empiric treatment using Rocephin given for 2 days while inpatient; final culture pending at discharge.  Patient discharged on Keflex twice a day for 3 more days to complete antibiotic therapy.  9-acute kidney injury -Patient creatinine on admission 1.2 -Suspected to be secondary to dehydration, UTI and continue use of nephrotoxic agents. -ACE inhibitors were discontinued on admission; fluid resuscitation provided and treatment empirically for UTI given. -Renal function back to normal at time of discharge -Safe to resume lisinopril again. -Recommend follow up metabolic panel to follow electrolytes and renal function and stability  Disposition Plan: SNF today  Antimicrobials: Anti-infectives (From admission, onward)   Start     Dose/Rate Route Frequency Ordered Stop   03/05/19 0000  cephALEXin (KEFLEX) 500 MG capsule     500 mg Oral 2 times daily 03/05/19 1406 03/08/19 2359   03/03/19 1000  cefTRIAXone (ROCEPHIN) 1 g in sodium chloride 0.9 % 100 mL IVPB     1 g 200 mL/hr over 30 Minutes Intravenous Every 24 hours 03/03/19 0956 03/08/19 0959       Subjective: Eager to start rehab  Objective: Vitals:   03/05/19 2201 03/06/19 0536 03/06/19 0827 03/06/19 0829  BP: (!) 191/78 (!) 186/83 (!) 156/73 (!) 156/73  Pulse: 94 92  89  Resp:      Temp: 98.3 F (36.8 C) 97.6 F (36.4 C)    TempSrc: Oral Oral    SpO2: 98% 98%  98%  Weight:      Height:        Intake/Output Summary (  Last 24 hours) at 03/06/2019 1100 Last data filed at 03/06/2019 0557 Gross per 24 hour  Intake 120 ml  Output 1100 ml  Net -980 ml   Filed Weights   03/01/19 0251   Weight: 74.4 kg    Examination:  General exam: Appears calm and comfortable  Respiratory system: Clear to auscultation. Respiratory effort normal. Data Reviewed: I have personally reviewed following labs and imaging studies  CBC: Recent Labs  Lab 02/28/19 2210 03/01/19 0247 03/04/19 0537  WBC 13.2* 12.8* 6.9  NEUTROABS 10.7*  --   --   HGB 14.3 13.8 11.1*  HCT 43.4 41.5 34.2*  MCV 85.8 84.5 86.4  PLT 288 291 266   Basic Metabolic Panel: Recent Labs  Lab 02/28/19 2210 03/01/19 0247 03/04/19 0537  NA 141 139 142  K 3.9 3.9 4.2  CL 106 99 111  CO2 18* 20* 21*  GLUCOSE 125* 106* 161*  BUN 22 21 29*  CREATININE 1.20* 1.10* 1.00  CALCIUM 9.3 9.3 8.7*   GFR: Estimated Creatinine Clearance: 44.5 mL/min (by C-G formula based on SCr of 1 mg/dL). Liver Function Tests: Recent Labs  Lab 02/28/19 2210 03/01/19 0247  AST 34 29  ALT 20 19  ALKPHOS 96 90  BILITOT 1.1 1.2  PROT 6.1* 5.7*  ALBUMIN 3.7 3.5   No results for input(s): LIPASE, AMYLASE in the last 168 hours. No results for input(s): AMMONIA in the last 168 hours. Coagulation Profile: No results for input(s): INR, PROTIME in the last 168 hours. Cardiac Enzymes: Recent Labs  Lab 02/28/19 2210  CKTOTAL 364*  TROPONINI <0.03   BNP (last 3 results) No results for input(s): PROBNP in the last 8760 hours. HbA1C: No results for input(s): HGBA1C in the last 72 hours. CBG: Recent Labs  Lab 03/05/19 0804 03/05/19 1200 03/05/19 1711 03/05/19 2205 03/06/19 0739  GLUCAP 158* 177* 165* 131* 140*   Lipid Profile: No results for input(s): CHOL, HDL, LDLCALC, TRIG, CHOLHDL, LDLDIRECT in the last 72 hours. Thyroid Function Tests: No results for input(s): TSH, T4TOTAL, FREET4, T3FREE, THYROIDAB in the last 72 hours. Anemia Panel: No results for input(s): VITAMINB12, FOLATE, FERRITIN, TIBC, IRON, RETICCTPCT in the last 72 hours. Sepsis Labs: No results for input(s): PROCALCITON, LATICACIDVEN in the last 168  hours.  Recent Results (from the past 240 hour(s))  SARS Coronavirus 2 (CEPHEID - Performed in [facility] hospital lab), Hosp Order     Status: None   Collection Time: 02/28/19 11:38 PM  Result Value Ref Range Status   SARS Coronavirus 2 NEGATIVE NEGATIVE Final    Comment: (NOTE) If result is NEGATIVE SARS-CoV-2 target nucleic acids are NOT DETECTED. The SARS-CoV-2 RNA is generally detectable in upper and lower  respiratory specimens during the acute phase of infection. The lowest  concentration of SARS-CoV-2 viral copies this assay can detect is 250  copies / mL. A negative result does not preclude SARS-CoV-2 infection  and should not be used as the sole basis for treatment or other  patient management decisions.  A negative result may occur with  improper specimen collection / handling, submission of specimen other  than nasopharyngeal swab, presence of viral mutation(s) within the  areas targeted by this assay, and inadequate number of viral copies  (<250 copies / mL). A negative result must be combined with clinical  observations, patient history, and epidemiological information. If result is POSITIVE SARS-CoV-2 target nucleic acids are DETECTED. The SARS-CoV-2 RNA is generally detectable in upper and lower  respiratory  specimens dur ing the acute phase of infection.  Positive  results are indicative of active infection with SARS-CoV-2.  Clinical  correlation with patient history and other diagnostic information is  necessary to determine patient infection status.  Positive results do  not rule out bacterial infection or co-infection with other viruses. If result is PRESUMPTIVE POSTIVE SARS-CoV-2 nucleic acids MAY BE PRESENT.   A presumptive positive result was obtained on the submitted specimen  and confirmed on repeat testing.  While 2019 novel coronavirus  (SARS-CoV-2) nucleic acids may be present in the submitted sample  additional confirmatory testing may be necessary for  epidemiological  and / or clinical management purposes  to differentiate between  SARS-CoV-2 and other Sarbecovirus currently known to infect humans.  If clinically indicated additional testing with an alternate test  methodology [id number]) is advised. The SARS-CoV-2 RNA is generally  detectable in upper and lower respiratory sp ecimens during the acute  phase of infection. The expected result is Negative. Fact Sheet for Patients:  BoilerBrush.com.cy Fact Sheet for Healthcare Providers: https://pope.com/ This test is not yet approved or cleared by the [place] FDA and has been authorized for detection and/or diagnosis of SARS-CoV-2 by FDA under an Emergency Use Authorization (EUA).  This EUA will remain in effect (meaning this test can be used) for the duration of the COVID-19 declaration under Section 564(b)(1) of the Act, 21 U.S.C. section 360bbb-3(b)(1), unless the authorization is terminated or revoked sooner. Performed at [facility], [address] [place], [place] [address]      Radiology Studies: No results found.  Scheduled Meds: . aspirin  325 mg Oral Daily  . clopidogrel  75 mg Oral Daily  . enoxaparin (LOVENOX) injection  40 mg Subcutaneous Q24H  . hydrALAZINE  75 mg Oral Q8H  . insulin aspart  0-9 Units Subcutaneous TID WC  . lisinopril  30 mg Oral Daily   Continuous Infusions: . cefTRIAXone (ROCEPHIN)  IV 1 g (03/06/19 0839)     LOS: 5 days   Rickey Barbara, MD [facility] Pager On Amion  If 7PM-7AM, please contact night-coverage 03/06/2019, 11:00 AM

## 2019-03-06 NOTE — Progress Notes (Signed)
Janice Little to be D/C'd Skilled nursing facility per MD order.  Discussed with the patient and all questions fully answered.  VSS, Skin clean, dry and intact without evidence of skin break down, no evidence of skin tears noted. IV catheter discontinued intact. Site without signs and symptoms of complications. Dressing and pressure applied.  An After Visit Summary was printed and given to the patient. Patient received prescription.  D/c education completed with patient/family including follow up instructions, medication list, d/c activities limitations if indicated, with other d/c instructions as indicated by MD - patient able to verbalize understanding, all questions fully answered.   Patient instructed to return to ED, call 911, or call MD for any changes in condition.   Patient escorted via WC, and D/C home via private auto.  Eligah East 03/06/2019 3:21 PM

## 2019-03-06 NOTE — TOC Progression Note (Signed)
Transition of Care [facility]) - Progression Note    Patient Details  Name: WAVEL LOKKEN MRN: [id number] Date of Birth: 02-16-1946  Transition of Care [facility]) CM/SW Contact  Mearl Latin, LCSW Phone Number: 03/06/2019, 9:56 AM  Clinical Narrative:    [facility] has insurance approval for patient to discharge there today at 2pm.    Expected Discharge Plan: Skilled Nursing Facility Barriers to Discharge: Continued Medical Work up  Expected Discharge Plan and Services Expected Discharge Plan: Skilled Nursing Facility In-house Referral: Clinical Social Work Discharge Planning Services: NA Post Acute Care Choice: Skilled Nursing Facility Living arrangements for the past 2 months: Apartment Expected Discharge Date: 03/05/19               DME Arranged: N/A DME Agency: NA       HH Arranged: NA HH Agency: NA         Social Determinants of Health (SDOH) Interventions    Readmission Risk Interventions Readmission Risk Prevention Plan 12/24/2018  Post Dischage Appt Complete  Medication Screening Complete  Transportation Screening Complete  Some recent data might be hidden

## 2019-03-06 NOTE — Progress Notes (Signed)
Patient has been oozing from the site of loop recorder insertion. Dressing changed  two times then notified rapid and apply pressure dressing. Patient denies for pain and discomfort. Will continue monitor the patient.

## 2019-03-06 NOTE — Care Management Important Message (Signed)
Important Message  Patient Details  Name: Janice Little MRN: [id number] Date of Birth: 11/02/1945   Medicare Important Message Given:  Yes    Raneisha Bress 03/06/2019, 1:32 PM

## 2019-03-06 NOTE — Progress Notes (Signed)
SLP Cancellation Note  Patient Details Name: DEVIKA BELIVEAU MRN: [id number] DOB: 1946-03-10   Cancelled treatment:       Reason Eval/Treat Not Completed: Patient at procedure or test/unavailable(Per medical record, pt has been discharged from facility. )  Yvone Neu I. Vear Clock, MS, CCC-SLP Acute Rehabilitation Services Office number [phone] Pager [phone]  Scheryl Marten 03/06/2019, 3:28 PM

## 2019-03-06 NOTE — TOC Transition Note (Signed)
Transition of Care [facility]) - CM/SW Discharge Note   Patient Details  Name: Janice Little MRN: [id number] Date of Birth: [birth year]  [facility]) CM/SW Contact:  Mearl Latin, LCSW Phone Number: 03/06/2019, 1:40 PM   Clinical Narrative:    Patient will DC to: [facility] Anticipated DC date: 03/06/19 Family notified: Son Transport by: Sharin Mons   Per MD patient ready for DC to [facility]. RN, patient, patient's family, and facility notified of DC. Discharge Summary and FL2 sent to facility. RN to call report prior to discharge [phone] Room [phone]). DC packet on chart. Ambulance transport requested for patient.   CSW will sign off for now as social work intervention is no longer needed. Please consult [place] again if new needs arise.  Cristobal Goldmann, LCSW Clinical Social Worker [phone]    Final next level of care: Skilled Nursing Facility Barriers to Discharge: No Barriers Identified   Patient Goals and CMS Choice Patient states their goals for this hospitalization and ongoing recovery are:: Rehab CMS Medicare.gov Compare Post Acute Care list provided to:: Patient Choice offered to / list presented to : Patient  Discharge Placement   Existing PASRR number confirmed : 03/06/19          Patient [facility] Patient to be transferred to facility by: [facility] Name of family member notified: Son Patient and family notified of of transfer: 03/06/19  Discharge Plan and Services In-house Referral: Clinical Social Work Discharge Planning Services: NA Post Acute Care Choice: Skilled Nursing Facility          DME Arranged: N/A DME Agency: NA       HH Arranged: NA HH Agency: NA        Social Determinants of Health (SDOH) Interventions     Readmission Risk Interventions Readmission Risk Prevention Plan 12/24/2018  Post Dischage Appt Complete  Medication Screening Complete  Transportation Screening Complete  Some recent  data might be hidden

## 2019-03-15 ENCOUNTER — Telehealth: Payer: Self-pay

## 2019-03-15 NOTE — Telephone Encounter (Signed)
LMOVM      COVID-19 Pre-Screening Questions:  . In the past 7 to 10 days have you had a cough,  shortness of breath, headache, congestion, fever (100 or greater) body aches, chills, sore throat, or sudden loss of taste or sense of smell? . Have you been around anyone with known Covid 19. . Have you been around anyone who is awaiting Covid 19 test results in the past 7 to 10 days? . Have you been around anyone who has been exposed to Covid 19, or has mentioned symptoms of Covid 19 within the past 7 to 10 days?  If you have any concerns/questions about symptoms patients report during screening (either on the phone or at threshold). Contact the provider seeing the patient or DOD for further guidance.  If neither are available contact a member of the leadership team.            

## 2019-03-18 NOTE — Telephone Encounter (Signed)
LMOVM for pt to call my direct office number to do pre-screen Covid-19 questions.       COVID-19 Pre-Screening Questions:  . In the past 7 to 10 days have you had a cough,  shortness of breath, headache, congestion, fever (100 or greater) body aches, chills, sore throat, or sudden loss of taste or sense of smell? . Have you been around anyone with known Covid 19. . Have you been around anyone who is awaiting Covid 19 test results in the past 7 to 10 days? . Have you been around anyone who has been exposed to Covid 19, or has mentioned symptoms of Covid 19 within the past 7 to 10 days?  If you have any concerns/questions about symptoms patients report during screening (either on the phone or at threshold). Contact the provider seeing the patient or DOD for further guidance.  If neither are available contact a member of the leadership team.

## 2019-03-19 ENCOUNTER — Ambulatory Visit (INDEPENDENT_AMBULATORY_CARE_PROVIDER_SITE_OTHER): Payer: Medicare Other | Admitting: *Deleted

## 2019-03-19 ENCOUNTER — Other Ambulatory Visit: Payer: Self-pay

## 2019-03-19 ENCOUNTER — Telehealth: Payer: Self-pay | Admitting: *Deleted

## 2019-03-19 DIAGNOSIS — I639 Cerebral infarction, unspecified: Secondary | ICD-10-CM

## 2019-03-19 LAB — CUP PACEART INCLINIC DEVICE CHECK
Date Time Interrogation Session: 20200616113838
Implantable Pulse Generator Implant Date: 20200602

## 2019-03-19 NOTE — Telephone Encounter (Signed)
Spoke with Maree Erie, patient's nurse at [facility]. Mailing address is [address], [place]: [address], [place], [id number]. She agrees to call when monitor is received for assistance with setup.

## 2019-03-19 NOTE — Patient Instructions (Addendum)
Follow-up with Dr. Lovena Le as needed.  Janice Sarna, RN, will order you a new home monitor for your loop recorder. When you receive this monitor, please set it up at your bedside and keep it plugged in at all times. You will need to send one manual transmission to pair the monitor with your device.

## 2019-03-19 NOTE — Progress Notes (Signed)
ILR wound check in clinic. Dressing removed. Wound well healed. No episodes, pause/brady detection off per protocol. Questions answered. Pt does not currently have a Carelink monitor, will order new monitor to be shipped to facility. Monthly summary reports and ROV with Dr. Lovena Le PRN.

## 2019-03-20 NOTE — Telephone Encounter (Signed)
Carelink monitor ordered via [place]. Should arrive in 7-10 business days.

## 2019-04-01 NOTE — Telephone Encounter (Signed)
Attempted to call back was on hold for over 9 minutes. Will attempt call again tomorrow.

## 2019-04-02 NOTE — Telephone Encounter (Signed)
I called [facility] to try to talked with the nurse. I gave the receptionist my direct phone number to give to the nurse so we can discuss getting the pt transmission with her home monitor.

## 2019-04-03 NOTE — Telephone Encounter (Signed)
LMOVM for facility to return call. No details on message due to unsecure VM.

## 2019-04-04 NOTE — Telephone Encounter (Signed)
Pt nurse Saralyn Pilar called to get help with the pt monitor. I tried to help him however, the monitor could not find good cell signal. Saralyn Pilar states that there is not good cell signal at the facility. I gave him the number to [employer] support. He states he was going to call tech support to get additional help.

## 2019-04-04 NOTE — Telephone Encounter (Signed)
Spoke with Saralyn Pilar, pt's nurse at facility. He reports monitor was received and is setup next to pt's bed. Monitor has not yet been paired with pt's device. Gave verbal transmission instructions and faxed written instructions, confirmation received. Saralyn Pilar agrees to attempt transmission. Advised I'll call back later if transmission is not received.

## 2019-04-08 ENCOUNTER — Ambulatory Visit (INDEPENDENT_AMBULATORY_CARE_PROVIDER_SITE_OTHER): Payer: Medicare Other | Admitting: *Deleted

## 2019-04-08 DIAGNOSIS — I639 Cerebral infarction, unspecified: Secondary | ICD-10-CM

## 2019-04-08 LAB — CUP PACEART REMOTE DEVICE CHECK
Date Time Interrogation Session: 20200705133644
Implantable Pulse Generator Implant Date: 20200602

## 2019-04-10 NOTE — Telephone Encounter (Signed)
Per phone note from 04/04/19 (see 6/12 encounter), pt's nurse was going to contact [facility] tech support for further assistance.

## 2019-04-17 NOTE — Progress Notes (Signed)
Carelink Summary Report / Loop Recorder 

## 2019-04-22 NOTE — Telephone Encounter (Signed)
Spoke with pts RN. She said it was her first day with this patient, she would look for the monitor and call back in 15 minutes.

## 2019-04-22 NOTE — Telephone Encounter (Signed)
Spoke with RN who stated she had experience with this device and kept getting a "magnifying glass" meaning it was searching for connection.    RN had completed troubleshooting before and moved the device to an area with better connection and had a successful transmission with a different patient.   RN states she will send a transmission in the next 15 minutes.    Legrand Como [address]" [place], PA-C 04/22/2019 3:45 PM

## 2019-04-22 NOTE — Telephone Encounter (Signed)
Left message with nurse for Pt RN to call me back to attempt to further troubleshoot her device.    Janice Little [address] [place], PA-C 04/22/2019 2:26 PM

## 2019-04-23 NOTE — Telephone Encounter (Signed)
Transmission from 04/22/19 reviewed--no episodes to date. Will continue monitoring remotely via Carelink.

## 2019-04-23 NOTE — Telephone Encounter (Signed)
Transmission received 04-22-2019

## 2019-05-10 ENCOUNTER — Ambulatory Visit (INDEPENDENT_AMBULATORY_CARE_PROVIDER_SITE_OTHER): Payer: Medicare Other | Admitting: *Deleted

## 2019-05-10 DIAGNOSIS — I639 Cerebral infarction, unspecified: Secondary | ICD-10-CM | POA: Diagnosis not present

## 2019-05-10 LAB — CUP PACEART REMOTE DEVICE CHECK
Date Time Interrogation Session: 20200807130503
Implantable Pulse Generator Implant Date: 20200602

## 2019-05-14 NOTE — Progress Notes (Signed)
Carelink Summary Report / Loop Recorder 

## 2019-06-12 ENCOUNTER — Ambulatory Visit (INDEPENDENT_AMBULATORY_CARE_PROVIDER_SITE_OTHER): Payer: Medicare Other | Admitting: *Deleted

## 2019-06-12 DIAGNOSIS — I639 Cerebral infarction, unspecified: Secondary | ICD-10-CM | POA: Diagnosis not present

## 2019-06-12 LAB — CUP PACEART REMOTE DEVICE CHECK
Date Time Interrogation Session: 20200909154111
Implantable Pulse Generator Implant Date: 20200602

## 2019-06-27 NOTE — Progress Notes (Signed)
Carelink Summary Report / Loop Recorder 

## 2019-07-03 ENCOUNTER — Encounter: Payer: Self-pay | Admitting: Neurology

## 2019-07-03 ENCOUNTER — Ambulatory Visit: Payer: Medicare Other | Admitting: Neurology

## 2019-07-03 ENCOUNTER — Telehealth: Payer: Self-pay | Admitting: *Deleted

## 2019-07-03 NOTE — Telephone Encounter (Signed)
No showed follow up appointment. 

## 2019-07-15 ENCOUNTER — Ambulatory Visit (INDEPENDENT_AMBULATORY_CARE_PROVIDER_SITE_OTHER): Payer: Medicare Other | Admitting: *Deleted

## 2019-07-15 DIAGNOSIS — I639 Cerebral infarction, unspecified: Secondary | ICD-10-CM

## 2019-07-16 LAB — CUP PACEART REMOTE DEVICE CHECK
Date Time Interrogation Session: 20201012221619
Implantable Pulse Generator Implant Date: 20200602

## 2019-07-25 NOTE — Progress Notes (Signed)
Carelink Summary Report / Loop Recorder 

## 2019-07-30 ENCOUNTER — Telehealth: Payer: Self-pay | Admitting: Neurology

## 2019-07-30 NOTE — Telephone Encounter (Signed)
Janice Little from [facility] called stating that the pt is needing to be seen sooner than scheduled appt only with MD. States that the PA and NP at the facility informed them that if she waits till her Jan appt she could be paralyzed. Please advise.

## 2019-07-30 NOTE — Telephone Encounter (Signed)
Called [facility] back. They asked I call Lennette Bihari directly at [phone]. He transports/scheudles appt. I called and scheduled sooner appt for 08/01/19 at 1:30pm with Dr. Felecia Shelling. Cx appt in January.

## 2019-08-01 ENCOUNTER — Other Ambulatory Visit: Payer: Self-pay

## 2019-08-01 ENCOUNTER — Encounter: Payer: Self-pay | Admitting: Neurology

## 2019-08-01 ENCOUNTER — Ambulatory Visit (INDEPENDENT_AMBULATORY_CARE_PROVIDER_SITE_OTHER): Payer: Medicare Other | Admitting: Neurology

## 2019-08-01 ENCOUNTER — Telehealth: Payer: Self-pay | Admitting: Neurology

## 2019-08-01 VITALS — BP 134/67 | HR 96 | Temp 97.7°F | Ht 65.0 in | Wt 144.0 lb

## 2019-08-01 DIAGNOSIS — R414 Neurologic neglect syndrome: Secondary | ICD-10-CM | POA: Diagnosis not present

## 2019-08-01 DIAGNOSIS — Z8669 Personal history of other diseases of the nervous system and sense organs: Secondary | ICD-10-CM

## 2019-08-01 DIAGNOSIS — I6340 Cerebral infarction due to embolism of unspecified cerebral artery: Secondary | ICD-10-CM

## 2019-08-01 DIAGNOSIS — M791 Myalgia, unspecified site: Secondary | ICD-10-CM | POA: Diagnosis not present

## 2019-08-01 NOTE — Telephone Encounter (Signed)
Tried Careers information officer at [facility] three times but the number was busy.

## 2019-08-01 NOTE — Progress Notes (Signed)
[facility]  PATIENT: Janice Little DOB: [birth year]  REFERRING DOCTOR OR PCP:  Leighton Ruff  SOURCE: Patient and records from Dr. Drema Dallas.  _________________________________   HISTORICAL  CHIEF COMPLAINT:  Chief Complaint  Patient presents with  . 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        Update 08/01/2019: She is much weaker than last year.  She was living in her condo until May.  She had a stroke 03/01/19 She had left sided weakness and a left field cut at presentation.  She is no longer walking with a cane but was using a walker before her last hospitalization.  She states that she was able to go about 100 feet with a walker..   She does PT but has much more pain and feels that that limits her.   She is now just going shoulder distances with a walker.   She reports a lot of pain everywhere, worse when she moves --- in joints and muscles.   She stopped PT because it was causing too much pain.  Records from her late May through June hospital admission were reviewed.  MRI and CTA were personally reviewed.  The MRIs show right MCA distribution scattered DWI foci and also a couple less intense left frontal lobe scattered foci.  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     Update 12/26/2017: She was diagnosed with MS many years ago after she presented with transverse myelitis in 1994 on top of a history of previous optic neuritis.  She was never on any disease modifying therapy.  When I reviewed her MRIs in 2016, the MRI changes of the brain were more consistent with chronic microvascular ischemic change and I did not see any spinal cord plaques in the cervical spine.  She denies any new symptoms since last year.    She  uses a cane and can walk a hundred feet if she had to.  Her right leg is weaker.   She has had 2 falls, once tripping on pavement and once when her slipper came off.  She sometimes has sharp stabbing pain in the LFCN numb region.  These spells last for a few seconds and are very intermittent.  Her urinary incontinence is unchanged.    She is very fatigued.  Fatigue is worse than sleepiness but she still takes naps daily x 1 hour.   .   Amantadine had not helped her and she stopped.  She gets 7 hours of sleep most nights and takes a nap or two.    She has snoring at night  (she recorded and there were only rare pauses).      I listened to about 3-4 minutes and only one pause.     From 12/22/2016:  MRI's in July 2016 showed brain changes more consistent with chronic microvascular ischemic events than with MS. MRI of the cervical spine showed degenerative changes. no spinal cord plaques.   She was placed on aspirin 81 mg after the MRI and she tolerates it well.    Gait/strength: She feels gait is off balance and she uses a cane.    Strength is fine but legs wobble if stands too long  Numbness:  The right anterolateral thigh is still numb after 2 years.   Dysesthesias are in the skin only and not deeper. It no longer itches.   She also notes some numbness in toes (felt due to DM)  She notes losing about 10 pounds this year and she is diabetic.     Bladder/bowel: She notes that the urinary incontinence is unchanged.    No urinary tract infections in the past couple of years.    She drinks cranberry juice to help prevent UTI.    In the past, she saw urology was placed on the medication without benefit.  Vision: Vision is baseline now.   She wears glasses.   She had ON in the past.    Fatigue/sleep: She is fatigued much of the time with poor stamina most days.   Physical fatigue > cognitive.    Also notes cognitive fatigue isbetter on aspirin.   She feels that she falls asleep and stays asleep well. She  sometimes has trouble waking up in the morning. She snores but does not have pauses or gasping at night.   .  Mood/cognition: She denies any depression or anxiety. She denies current cognitive problems but has felt in a fog at times. She fel better on Aspirin  MS/TN/ON History:  In 1986, she presented with right optic neuritis and was referred to Dr. Erling Cruz who placed her on oral steroids. She had an MRI of the brain that was normal at that time. 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 However, the MRI did not show any definite changes. Imaging studies were probably about 14 years ago and she last saw Dr. Erling Cruz around 2002.    REVIEW OF SYSTEMS: Constitutional: No fevers, chills, sweats, or change in appetite.   Notes fatigue Eyes: No   eye pain   See above. Ear, nose and throat: No hearing loss, ear pain, nasal congestion, sore throat Cardiovascular: No chest pain, palpitations Respiratory: No shortness of breath at rest or with exertion.   No wheezes GastrointestinaI: No nausea, vomiting, diarrhea, abdominal pain, fecal incontinence Genitourinary: as above Musculoskeletal: No neck pain, back pain Integumentary: No rash, pruritus, skin lesions Neurological: as above Psychiatric: No depression at this time.  No anxiety Endocrine: She has DM Hematologic/Lymphatic: No anemia, purpura, petechiae. Allergic/Immunologic: No itchy/runny eyes, nasal congestion, recent allergic reactions, rashes  ALLERGIES: No Known Allergies  HOME MEDICATIONS:  Current Outpatient Medications:  .  acetaminophen (TYLENOL) 325 MG tablet, Take 2 tablets (650 mg total) by mouth every 4 (four) hours as needed for mild pain (or temp > 37.5 C (99.5 F))., Disp: , Rfl:  .  amLODipine (NORVASC) 5 MG tablet, Take 5 mg by mouth daily., Disp: , Rfl:  .  aspirin 325 MG tablet, Take 1 tablet (325 mg total) by mouth daily., Disp: , Rfl:  .   hydrALAZINE (APRESOLINE) 25 MG tablet, Take 3 tablets (75 mg total) by mouth every 8 (eight) hours., Disp: , Rfl:  .  lisinopril (ZESTRIL) 30 MG tablet, Take 1 tablet (30 mg total) by mouth daily., Disp: , Rfl:  .  Melatonin 3 MG TABS, Take by mouth., Disp: , Rfl:  .  metFORMIN (GLUCOPHAGE-XR) 500 MG 24 hr tablet, Take 1 tablet (500 mg total) by mouth 2 (two) times a day., Disp: , Rfl:  .  Multiple Vitamin (MULTIVITAMIN) capsule, Take 1 capsule by mouth daily., Disp: , Rfl:  .  nitrofurantoin, macrocrystal-monohydrate, (  MACROBID) 100 MG capsule, Take 100 mg by mouth 2 (two) times daily., Disp: , Rfl:  .  omeprazole (PRILOSEC OTC) 20 MG tablet, Take 20 mg by mouth daily., Disp: , Rfl:  .  omeprazole (PRILOSEC) 20 MG capsule, Take 20 mg by mouth daily., Disp: , Rfl:  .  sitaGLIPtin (JANUVIA) 50 MG tablet, Take 50 mg by mouth daily., Disp: , Rfl:  .  UNABLE TO FIND, Incromega [id number]  (fish oils), Disp: , Rfl:   PAST MEDICAL HISTORY: Past Medical History:  Diagnosis Date  . Chronic kidney disease (CKD), stage II (mild)   . Chronic pain   . DM type 2 (diabetes mellitus, type 2) ([place])   . Fatty liver    per CT  . GERD (gastroesophageal reflux disease)   . History of hepatitis as a child    age 74 (jaundice)  . HTN (hypertension)   . Hyperlipidemia   . MS (multiple sclerosis) ([place])   . Obesity   . Osteopenia   . Primary hyperparathyroidism (HCC)    s/p surgery removal of parathyroid (Dr. Buddy Duty, Dr. Harlow Asa)  . Vitamin D deficiency     PAST SURGICAL HISTORY: Past Surgical History:  Procedure Laterality Date  . ANTERIOR CRUCIATE LIGAMENT REPAIR Left   . LOOP RECORDER INSERTION N/A 03/05/2019   Procedure: LOOP RECORDER INSERTION;  Surgeon: Evans Lance, MD;  Location: [facility];  Service: Cardiovascular;  Laterality: N/A;  . VESICOVAGINAL FISTULA CLOSURE W/ TAH      FAMILY HISTORY: Family History  Problem Relation Age of Onset  . Mental illness Mother   . COPD Father   .  Lung cancer Father     SOCIAL HISTORY:  Social History   Socioeconomic History  . Marital status: Legally Separated    Spouse name: Not on file  . Number of children: Not on file  . Years of education: Not on file  . Highest education level: Not on file  Occupational History  . Not on file  Social Needs  . Financial resource strain: Not on file  . Food insecurity    Worry: Not on file    Inability: Not on file  . Transportation needs    Medical: Not on file    Non-medical: Not on file  Tobacco Use  . Smoking status: Never Smoker  . Smokeless tobacco: Never Used  Substance and Sexual Activity  . Alcohol use: No  . Drug use: No  . Sexual activity: Not on file  Lifestyle  . Physical activity    Days per week: Not on file    Minutes per session: Not on file  . Stress: Not on file  Relationships  . Social Herbalist on phone: Not on file    Gets together: Not on file    Attends religious service: Not on file    Active member of club or organization: Not on file    Attends meetings of clubs or organizations: Not on file    Relationship status: Not on file  . Intimate partner violence    Fear of current or ex partner: Not on file    Emotionally abused: Not on file    Physically abused: Not on file    Forced sexual activity: Not on file  Other Topics Concern  . Not on file  Social History Narrative  . Not on file     PHYSICAL EXAM  Vitals:   08/01/19 1335  BP: 134/67  Pulse: 96  Temp: 97.7 F (36.5 C)  Weight: 144 lb (65.3 kg)  Height: '5\' 5"'$  (1.651 m)    Body mass index is 23.96 kg/m.   General: The patient is well-developed and well-nourished and in no acute distress   Musculoskeletal:  Back is nontender  Neurologic Exam  Mental status: The patient is alert and oriented x 3 at the time of the examination. The patient has apparent normal recent and remote memory, with an apparently normal attention span and concentration ability.   Speech  is normal.  Cranial nerves: Extraocular movements are full. Pupils are equal, round, and reactive to light and accomodation.  She has left visual neglect but no hemianopsia.   there is good facial sensation to soft touch bilaterally.Facial strength is normal.  Trapezius and sternocleidomastoid strength is normal. No dysarthria is noted.   No obvious hearing deficits are noted.  Motor:  Muscle bulk is normal.   Tone is normal. Strength is  4/5 to 4+/5 on the left.  There is variable effort.  Sensory: She has normal sensation to touch and vibration on both sides.  However, she has extinction on the left with double stimulation.  Coordination: Cerebellar testing reveals good finger-nose-finger on the right and mildly reduced on the left  Gait and station: She is currently on a stretcher and is unable to safely get off for me to test station or gait.  Reflexes: Deep tendon reflexes are symmetric and normal bilaterally.   Plantar responses are flexor.    DIAGNOSTIC DATA (LABS, IMAGING, TESTING) - I reviewed patient records, labs, notes, testing and imaging myself where available.      ASSESSMENT AND PLAN  Cerebrovascular accident (CVA) due to embolism of cerebral artery ([place]) - Plan: Sedimentation rate, C-reactive protein  Left-sided neglect  Myalgia - Plan: CK, Sedimentation rate, C-reactive protein  History of optic neuritis   1.   For pain and mood:   Duloxetine 60 mg daily and gabapentin 100 mg po tid 2.   continue aspirin 81 mg by mouth daily 3.   Try to resume PT 4.   Check CK, ESR, CRP for myalgias 5.   return to clinic in 12 months or sooner if there are new or worsening neurologic symptoms.  40-minute face-to-face evaluation with greater than one half the time counseling and coordinating care discussing her recent admission regarding her stroke and her new symptoms   Richard A. Felecia Shelling, MD, PhD 29/92/4268, 3:41 PM Certified in Neurology, Clinical Neurophysiology, Sleep  Medicine, Pain Medicine and Neuroimaging  [facility] [address], [place], [place] [address] [phone]

## 2019-08-01 NOTE — Telephone Encounter (Signed)
Tried to call nursing home again, number is still busy.

## 2019-08-01 NOTE — Telephone Encounter (Signed)
Judeen Hammans is calling from [facility] wanting to discuss the pt's medication. Please advise.

## 2019-08-02 LAB — C-REACTIVE PROTEIN: CRP: 32 mg/L — ABNORMAL HIGH (ref 0–10)

## 2019-08-02 LAB — SEDIMENTATION RATE: Sed Rate: 60 mm/hr — ABNORMAL HIGH (ref 0–40)

## 2019-08-02 LAB — CK: Total CK: 23 U/L — ABNORMAL LOW (ref 32–182)

## 2019-08-05 NOTE — Telephone Encounter (Addendum)
The number provided below is still ringing busy.  I called the main number to [facility] [phone]) and was transferred to the patient's nursing unit.  I left a message requesting a call back, if they still have medication questions.  We are happy to help.

## 2019-08-12 ENCOUNTER — Telehealth: Payer: Self-pay | Admitting: Neurology

## 2019-08-12 DIAGNOSIS — R700 Elevated erythrocyte sedimentation rate: Secondary | ICD-10-CM | POA: Insufficient documentation

## 2019-08-12 DIAGNOSIS — M791 Myalgia, unspecified site: Secondary | ICD-10-CM

## 2019-08-12 DIAGNOSIS — R7982 Elevated C-reactive protein (CRP): Secondary | ICD-10-CM | POA: Insufficient documentation

## 2019-08-12 DIAGNOSIS — R5383 Other fatigue: Secondary | ICD-10-CM

## 2019-08-12 MED ORDER — PREDNISONE 10 MG PO TABS: ORAL_TABLET | ORAL | 3 refills | Status: DC

## 2019-08-12 NOTE — Telephone Encounter (Signed)
I was able to reach Janice Little.  The CRP and ESR were both elevated.  Therefore due to her symptoms and abnormal lab values she could have giant cell arteritis and I will need to refer her for a temporal artery biopsy.  Additionally I am going to start prednisone.  As she has diabetes I will place her on half dose of 30 mg daily and she is advised to monitor her sugars.

## 2019-08-17 LAB — CUP PACEART REMOTE DEVICE CHECK
Date Time Interrogation Session: 20201114195506
Implantable Pulse Generator Implant Date: 20200602

## 2019-08-19 ENCOUNTER — Ambulatory Visit (INDEPENDENT_AMBULATORY_CARE_PROVIDER_SITE_OTHER): Payer: Medicare Other | Admitting: *Deleted

## 2019-08-19 DIAGNOSIS — I6340 Cerebral infarction due to embolism of unspecified cerebral artery: Secondary | ICD-10-CM | POA: Diagnosis not present

## 2019-08-26 NOTE — Telephone Encounter (Signed)
I contacted Janice Little and advised I would send fax order for prednisone their fax # is [phone]. She reports the pt is in long term care now and they would need to be notified of information pertaining to the pt's care. She reports she will call the vein an vascular spec.

## 2019-08-26 NOTE — Telephone Encounter (Signed)
Margy Clarks nurse at [facility] is called and left a voicemail  stating they havent received the new med order for Prednisone. And she would like a call back to discuss  CB# [phone]

## 2019-09-13 NOTE — Progress Notes (Signed)
Carelink Summary Report / Loop Recorder 

## 2019-09-16 ENCOUNTER — Emergency Department (HOSPITAL_COMMUNITY): Payer: Medicare Other

## 2019-09-16 ENCOUNTER — Inpatient Hospital Stay (HOSPITAL_COMMUNITY)
Admission: EM | Admit: 2019-09-16 | Discharge: 2019-09-24 | DRG: 872 | Disposition: A | Discharge status: Skilled Nursing Facility | Payer: Medicare Other | Source: Skilled Nursing Facility | Attending: Internal Medicine | Admitting: Internal Medicine

## 2019-09-16 ENCOUNTER — Encounter (HOSPITAL_COMMUNITY): Payer: Self-pay

## 2019-09-16 DIAGNOSIS — Z7982 Long term (current) use of aspirin: Secondary | ICD-10-CM

## 2019-09-16 DIAGNOSIS — L89151 Pressure ulcer of sacral region, stage 1: Secondary | ICD-10-CM | POA: Diagnosis present

## 2019-09-16 DIAGNOSIS — A499 Bacterial infection, unspecified: Secondary | ICD-10-CM | POA: Diagnosis not present

## 2019-09-16 DIAGNOSIS — L89611 Pressure ulcer of right heel, stage 1: Secondary | ICD-10-CM | POA: Diagnosis present

## 2019-09-16 DIAGNOSIS — Z801 Family history of malignant neoplasm of trachea, bronchus and lung: Secondary | ICD-10-CM | POA: Diagnosis not present

## 2019-09-16 DIAGNOSIS — N136 Pyonephrosis: Secondary | ICD-10-CM | POA: Diagnosis present

## 2019-09-16 DIAGNOSIS — M8580 Other specified disorders of bone density and structure, unspecified site: Secondary | ICD-10-CM | POA: Diagnosis present

## 2019-09-16 DIAGNOSIS — E559 Vitamin D deficiency, unspecified: Secondary | ICD-10-CM | POA: Diagnosis present

## 2019-09-16 DIAGNOSIS — K219 Gastro-esophageal reflux disease without esophagitis: Secondary | ICD-10-CM | POA: Diagnosis present

## 2019-09-16 DIAGNOSIS — Z20828 Contact with and (suspected) exposure to other viral communicable diseases: Secondary | ICD-10-CM | POA: Diagnosis present

## 2019-09-16 DIAGNOSIS — E872 Acidosis: Secondary | ICD-10-CM | POA: Diagnosis not present

## 2019-09-16 DIAGNOSIS — E785 Hyperlipidemia, unspecified: Secondary | ICD-10-CM | POA: Diagnosis present

## 2019-09-16 DIAGNOSIS — N182 Chronic kidney disease, stage 2 (mild): Secondary | ICD-10-CM | POA: Diagnosis present

## 2019-09-16 DIAGNOSIS — A4159 Other Gram-negative sepsis: Secondary | ICD-10-CM | POA: Diagnosis not present

## 2019-09-16 DIAGNOSIS — I639 Cerebral infarction, unspecified: Secondary | ICD-10-CM | POA: Diagnosis not present

## 2019-09-16 DIAGNOSIS — Z79890 Hormone replacement therapy (postmenopausal): Secondary | ICD-10-CM

## 2019-09-16 DIAGNOSIS — N390 Urinary tract infection, site not specified: Secondary | ICD-10-CM | POA: Diagnosis present

## 2019-09-16 DIAGNOSIS — R197 Diarrhea, unspecified: Secondary | ICD-10-CM

## 2019-09-16 DIAGNOSIS — E875 Hyperkalemia: Secondary | ICD-10-CM | POA: Diagnosis present

## 2019-09-16 DIAGNOSIS — Z87440 Personal history of urinary (tract) infections: Secondary | ICD-10-CM | POA: Diagnosis not present

## 2019-09-16 DIAGNOSIS — L89621 Pressure ulcer of left heel, stage 1: Secondary | ICD-10-CM | POA: Diagnosis present

## 2019-09-16 DIAGNOSIS — E1122 Type 2 diabetes mellitus with diabetic chronic kidney disease: Secondary | ICD-10-CM | POA: Diagnosis present

## 2019-09-16 DIAGNOSIS — R310 Gross hematuria: Secondary | ICD-10-CM | POA: Diagnosis not present

## 2019-09-16 DIAGNOSIS — F329 Major depressive disorder, single episode, unspecified: Secondary | ICD-10-CM | POA: Diagnosis present

## 2019-09-16 DIAGNOSIS — Z825 Family history of asthma and other chronic lower respiratory diseases: Secondary | ICD-10-CM

## 2019-09-16 DIAGNOSIS — N10 Acute tubulo-interstitial nephritis: Secondary | ICD-10-CM | POA: Diagnosis not present

## 2019-09-16 DIAGNOSIS — Z7984 Long term (current) use of oral hypoglycemic drugs: Secondary | ICD-10-CM

## 2019-09-16 DIAGNOSIS — R710 Precipitous drop in hematocrit: Secondary | ICD-10-CM | POA: Diagnosis not present

## 2019-09-16 DIAGNOSIS — E11649 Type 2 diabetes mellitus with hypoglycemia without coma: Secondary | ICD-10-CM | POA: Diagnosis present

## 2019-09-16 DIAGNOSIS — E1140 Type 2 diabetes mellitus with diabetic neuropathy, unspecified: Secondary | ICD-10-CM | POA: Diagnosis present

## 2019-09-16 DIAGNOSIS — A419 Sepsis, unspecified organism: Secondary | ICD-10-CM

## 2019-09-16 DIAGNOSIS — G8929 Other chronic pain: Secondary | ICD-10-CM | POA: Diagnosis present

## 2019-09-16 DIAGNOSIS — Z90710 Acquired absence of both cervix and uterus: Secondary | ICD-10-CM

## 2019-09-16 DIAGNOSIS — R6520 Severe sepsis without septic shock: Secondary | ICD-10-CM | POA: Diagnosis not present

## 2019-09-16 DIAGNOSIS — Z8673 Personal history of transient ischemic attack (TIA), and cerebral infarction without residual deficits: Secondary | ICD-10-CM | POA: Diagnosis not present

## 2019-09-16 DIAGNOSIS — G35 Multiple sclerosis: Secondary | ICD-10-CM | POA: Diagnosis present

## 2019-09-16 DIAGNOSIS — K760 Fatty (change of) liver, not elsewhere classified: Secondary | ICD-10-CM | POA: Diagnosis present

## 2019-09-16 DIAGNOSIS — Z79899 Other long term (current) drug therapy: Secondary | ICD-10-CM

## 2019-09-16 DIAGNOSIS — N3001 Acute cystitis with hematuria: Secondary | ICD-10-CM | POA: Diagnosis not present

## 2019-09-16 DIAGNOSIS — N179 Acute kidney failure, unspecified: Secondary | ICD-10-CM

## 2019-09-16 DIAGNOSIS — Z7952 Long term (current) use of systemic steroids: Secondary | ICD-10-CM

## 2019-09-16 DIAGNOSIS — I10 Essential (primary) hypertension: Secondary | ICD-10-CM | POA: Diagnosis present

## 2019-09-16 DIAGNOSIS — R911 Solitary pulmonary nodule: Secondary | ICD-10-CM | POA: Diagnosis present

## 2019-09-16 DIAGNOSIS — M810 Age-related osteoporosis without current pathological fracture: Secondary | ICD-10-CM | POA: Diagnosis present

## 2019-09-16 DIAGNOSIS — I129 Hypertensive chronic kidney disease with stage 1 through stage 4 chronic kidney disease, or unspecified chronic kidney disease: Secondary | ICD-10-CM | POA: Diagnosis present

## 2019-09-16 DIAGNOSIS — Z818 Family history of other mental and behavioral disorders: Secondary | ICD-10-CM

## 2019-09-16 LAB — LACTIC ACID, PLASMA
Lactic Acid, Venous: 6.4 mmol/L (ref 0.5–1.9)
Lactic Acid, Venous: 3.5 mmol/L (ref 0.5–1.9)
Lactic Acid, Venous: 2.7 mmol/L (ref 0.5–1.9)
Lactic Acid, Venous: 2 mmol/L (ref 0.5–1.9)

## 2019-09-16 LAB — CBC
HCT: 38.6 % (ref 36.0–46.0)
Hemoglobin: 11.9 g/dL — ABNORMAL LOW (ref 12.0–15.0)
MCH: 29.2 pg (ref 26.0–34.0)
MCHC: 30.8 g/dL (ref 30.0–36.0)
MCV: 94.8 fL (ref 80.0–100.0)
Platelets: 325 10*3/uL (ref 150–400)
RBC: 4.07 MIL/uL (ref 3.87–5.11)
RDW: 14.9 % (ref 11.5–15.5)
WBC: 23.8 10*3/uL — ABNORMAL HIGH (ref 4.0–10.5)
nRBC: 0 % (ref 0.0–0.2)

## 2019-09-16 LAB — URINALYSIS, ROUTINE W REFLEX MICROSCOPIC
Bilirubin Urine: NEGATIVE
Glucose, UA: NEGATIVE mg/dL
Hgb urine dipstick: NEGATIVE
Ketones, ur: 5 mg/dL — AB
Nitrite: NEGATIVE
Protein, ur: 100 mg/dL — AB
RBC / HPF: >50 RBC/hpf — ABNORMAL HIGH (ref 0–5)
Specific Gravity, Urine: 1.023 (ref 1.005–1.030)
WBC, UA: >50 WBC/hpf — ABNORMAL HIGH (ref 0–5)
pH: 7 (ref 5.0–8.0)

## 2019-09-16 LAB — COMPREHENSIVE METABOLIC PANEL
ALT: 39 U/L (ref 0–44)
AST: 48 U/L — ABNORMAL HIGH (ref 15–41)
Albumin: 3.1 g/dL — ABNORMAL LOW (ref 3.5–5.0)
Alkaline Phosphatase: 111 U/L (ref 38–126)
Anion gap: 18 — ABNORMAL HIGH (ref 5–15)
BUN: 75 mg/dL — ABNORMAL HIGH (ref 8–23)
CO2: 15 mmol/L — ABNORMAL LOW (ref 22–32)
Calcium: 9.4 mg/dL (ref 8.9–10.3)
Chloride: 103 mmol/L (ref 98–111)
Creatinine, Ser: 1.6 mg/dL — ABNORMAL HIGH (ref 0.44–1.00)
GFR calc Af Amer: 36 mL/min — ABNORMAL LOW (ref >60)
GFR calc non Af Amer: 31 mL/min — ABNORMAL LOW (ref >60)
Glucose, Bld: 249 mg/dL — ABNORMAL HIGH (ref 70–99)
Potassium: 5.6 mmol/L — ABNORMAL HIGH (ref 3.5–5.1)
Sodium: 136 mmol/L (ref 135–145)
Total Bilirubin: 1 mg/dL (ref 0.3–1.2)
Total Protein: 5.8 g/dL — ABNORMAL LOW (ref 6.5–8.1)

## 2019-09-16 LAB — CBC WITH DIFFERENTIAL/PLATELET
Abs Immature Granulocytes: 0 10*3/uL (ref 0.00–0.07)
Band Neutrophils: 22 %
Basophils Absolute: 0 10*3/uL (ref 0.0–0.1)
Basophils Relative: 0 %
Eosinophils Absolute: 0 10*3/uL (ref 0.0–0.5)
Eosinophils Relative: 0 %
HCT: 47.1 % — ABNORMAL HIGH (ref 36.0–46.0)
Hemoglobin: 13.8 g/dL (ref 12.0–15.0)
Lymphocytes Relative: 11 %
Lymphs Abs: 2.9 10*3/uL (ref 0.7–4.0)
MCH: 29.1 pg (ref 26.0–34.0)
MCHC: 29.3 g/dL — ABNORMAL LOW (ref 30.0–36.0)
MCV: 99.2 fL (ref 80.0–100.0)
Monocytes Absolute: 1.3 10*3/uL — ABNORMAL HIGH (ref 0.1–1.0)
Monocytes Relative: 5 %
Neutro Abs: 22.5 10*3/uL — ABNORMAL HIGH (ref 1.7–7.7)
Neutrophils Relative %: 62 %
Platelets: 348 10*3/uL (ref 150–400)
RBC: 4.75 MIL/uL (ref 3.87–5.11)
RDW: 14.9 % (ref 11.5–15.5)
WBC Morphology: INCREASED
WBC: 26.8 10*3/uL — ABNORMAL HIGH (ref 4.0–10.5)
nRBC: 0 % (ref 0.0–0.2)

## 2019-09-16 LAB — BASIC METABOLIC PANEL
Anion gap: 11 (ref 5–15)
BUN: 72 mg/dL — ABNORMAL HIGH (ref 8–23)
CO2: 20 mmol/L — ABNORMAL LOW (ref 22–32)
Calcium: 8.3 mg/dL — ABNORMAL LOW (ref 8.9–10.3)
Chloride: 111 mmol/L (ref 98–111)
Creatinine, Ser: 1.5 mg/dL — ABNORMAL HIGH (ref 0.44–1.00)
GFR calc Af Amer: 39 mL/min — ABNORMAL LOW (ref >60)
GFR calc non Af Amer: 34 mL/min — ABNORMAL LOW (ref >60)
Glucose, Bld: 167 mg/dL — ABNORMAL HIGH (ref 70–99)
Potassium: 5.1 mmol/L (ref 3.5–5.1)
Sodium: 142 mmol/L (ref 135–145)

## 2019-09-16 LAB — PROCALCITONIN: Procalcitonin: 3.14 ng/mL

## 2019-09-16 LAB — CREATININE, SERUM
Creatinine, Ser: 1.45 mg/dL — ABNORMAL HIGH (ref 0.44–1.00)
GFR calc Af Amer: 41 mL/min — ABNORMAL LOW (ref >60)
GFR calc non Af Amer: 35 mL/min — ABNORMAL LOW (ref >60)

## 2019-09-16 LAB — RESPIRATORY PANEL BY RT PCR (FLU A&B, COVID)
Influenza A by PCR: NEGATIVE
Influenza B by PCR: NEGATIVE
SARS Coronavirus 2 by RT PCR: NEGATIVE

## 2019-09-16 LAB — MRSA PCR SCREENING: MRSA by PCR: NEGATIVE

## 2019-09-16 LAB — PROTIME-INR
INR: 1 (ref 0.8–1.2)
Prothrombin Time: 13.2 seconds (ref 11.4–15.2)

## 2019-09-16 LAB — LIPASE, BLOOD: Lipase: 23 U/L (ref 11–51)

## 2019-09-16 LAB — APTT: aPTT: 24 seconds (ref 24–36)

## 2019-09-16 MED ORDER — SODIUM CHLORIDE 0.9 % IV SOLN
2.0000 g | Freq: Once | INTRAVENOUS | Status: AC
  Administered 2019-09-16: 09:00:00 2 g via INTRAVENOUS
  Filled 2019-09-16: qty 20

## 2019-09-16 MED ORDER — ACETAMINOPHEN 325 MG PO TABS
650.0000 mg | ORAL_TABLET | ORAL | Status: DC | PRN
  Administered 2019-09-16 – 2019-09-22 (×5): 650 mg via ORAL
  Filled 2019-09-16 (×5): qty 2

## 2019-09-16 MED ORDER — ONDANSETRON HCL 4 MG/2ML IJ SOLN: 4.0000 mg | Freq: Four times a day (QID) | INTRAMUSCULAR | Status: DC | PRN

## 2019-09-16 MED ORDER — FAMOTIDINE 20 MG PO TABS
20.0000 mg | ORAL_TABLET | Freq: Two times a day (BID) | ORAL | Status: DC
  Administered 2019-09-16 – 2019-09-17 (×3): 20 mg via ORAL
  Filled 2019-09-16 (×3): qty 1

## 2019-09-16 MED ORDER — SODIUM CHLORIDE 0.9 % IV SOLN
2.0000 g | Freq: Two times a day (BID) | INTRAVENOUS | Status: DC
  Administered 2019-09-16 – 2019-09-18 (×5): 2 g via INTRAVENOUS
  Filled 2019-09-16 (×4): qty 2

## 2019-09-16 MED ORDER — CHLORHEXIDINE GLUCONATE CLOTH 2 % EX PADS
6.0000 | MEDICATED_PAD | Freq: Every day | CUTANEOUS | Status: DC
  Administered 2019-09-16 – 2019-09-20 (×5): 6 via TOPICAL

## 2019-09-16 MED ORDER — HEPARIN SODIUM (PORCINE) 5000 UNIT/ML IJ SOLN
5000.0000 [IU] | Freq: Three times a day (TID) | INTRAMUSCULAR | Status: DC
  Administered 2019-09-16 – 2019-09-18 (×6): 5000 [IU] via SUBCUTANEOUS
  Filled 2019-09-16 (×7): qty 1

## 2019-09-16 MED ORDER — LACTATED RINGERS IV SOLN
INTRAVENOUS | Status: AC
  Administered 2019-09-16: 14:00:00 via INTRAVENOUS

## 2019-09-16 MED ORDER — SODIUM CHLORIDE 0.9 % IV BOLUS
2000.0000 mL | Freq: Once | INTRAVENOUS | Status: AC
  Administered 2019-09-16: 2000 mL via INTRAVENOUS

## 2019-09-16 MED ORDER — SODIUM CHLORIDE 0.9 % IV BOLUS
1000.0000 mL | Freq: Once | INTRAVENOUS | Status: AC
  Administered 2019-09-16: 10:00:00 1000 mL via INTRAVENOUS

## 2019-09-16 MED ORDER — SODIUM CHLORIDE 0.9 % IV SOLN
2.0000 g | INTRAVENOUS | Status: DC
  Filled 2019-09-16: qty 2

## 2019-09-16 NOTE — ED Notes (Signed)
Patient had bowel movement. Sacrum area is very red. Mepilex applied

## 2019-09-16 NOTE — Progress Notes (Signed)
Notified bedside nurse of need to administer antibiotics and get blood cultures first.

## 2019-09-16 NOTE — ED Triage Notes (Signed)
Pt coming from [facility] Patient BIB EMS for abdominal pain N/V/D. Patient reportedly is COVID negative.

## 2019-09-16 NOTE — ED Notes (Signed)
Myrtis Ser (son) nurse at [facility] [phone]

## 2019-09-16 NOTE — Progress Notes (Signed)
PCCM:  I spoke with Dr. Olevia Bowens who will arrange [facility] to pick patient up on their services tomorrow.   [facility] 09/16/2019 1:53 PM

## 2019-09-16 NOTE — ED Provider Notes (Signed)
[facility]-EMERGENCY DEPT Provider Note   CSN: [id number] Arrival date & time: 09/16/19  [id number]     History Chief Complaint  Patient presents with  . Abdominal Pain  . Nausea  . Emesis  . Diarrhea    Janice Little is a 73 y.o. female.  Patient presents with vomiting and diarrhea and abdominal pain.  Patient has a history of multiple sclerosis diabetes hypertension and stroke  The history is provided by the patient. No language interpreter was used.  Abdominal Pain Pain location:  Suprapubic Pain quality: aching   Pain radiates to:  Does not radiate Pain severity:  Moderate Onset quality:  Sudden Timing:  Constant Progression:  Worsening Chronicity:  New Context: not alcohol use   Associated symptoms: diarrhea and vomiting   Associated symptoms: no chest pain, no cough, no fatigue and no hematuria   Emesis Associated symptoms: abdominal pain and diarrhea   Associated symptoms: no cough and no headaches   Diarrhea Associated symptoms: abdominal pain and vomiting   Associated symptoms: no headaches        Past Medical History:  Diagnosis Date  . Chronic kidney disease (CKD), stage II (mild)   . Chronic pain   . DM type 2 (diabetes mellitus, type 2) (HCC)   . Fatty liver    per CT  . GERD (gastroesophageal reflux disease)   . History of hepatitis as a child    age 89 (jaundice)  . HTN (hypertension)   . Hyperlipidemia   . MS (multiple sclerosis) (HCC)   . Obesity   . Osteopenia   . Primary hyperparathyroidism (HCC)    s/p surgery removal of parathyroid (Dr. Sharl MaKerr, Dr. Gerrit FriendsGerkin)  . Vitamin D deficiency     Patient Active Problem List   Diagnosis Date Noted  . Elevated sed rate 08/12/2019  . Elevated C-reactive protein (CRP) 08/12/2019  . Left-sided neglect 08/01/2019  . Myalgia 08/01/2019  . Pressure injury of skin 03/04/2019  . Acute lower UTI   . Stroke (HCC) 03/02/2019  . Acute CVA (cerebrovascular accident) (HCC) 03/01/2019    . Hypertensive urgency 12/19/2018  . History of myelitis 12/26/2017  . History of optic neuritis 04/02/2015  . Diplopia 04/02/2015  . Urinary incontinence 04/02/2015  . Other fatigue 04/02/2015  . 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 Numbness 04/02/2015  . Ataxic gait 04/02/2015  . Essential tremor 04/02/2015  . PRIMARY HYPERPARATHYROIDISM 02/01/2008  . OSTEOPOROSIS 02/01/2008  . Diabetes mellitus with diabetic neuropathy (HCC) 01/31/2008  . Hyperlipidemia 01/31/2008  . Multiple sclerosis (HCC) 01/31/2008  . Optic neuritis 01/31/2008  . Essential hypertension 01/31/2008  . GERD 01/31/2008    Past Surgical History:  Procedure Laterality Date  . ANTERIOR CRUCIATE LIGAMENT REPAIR Left   . LOOP RECORDER INSERTION N/A 03/05/2019   Procedure: LOOP RECORDER INSERTION;  Surgeon: Marinus Mawaylor, Gregg W, MD;  [facility];  Service: Cardiovascular;  Laterality: N/A;  . VESICOVAGINAL FISTULA CLOSURE W/ TAH       OB History   No obstetric history on file.     Family History  Problem Relation Age of Onset  . Mental illness Mother   . COPD Father   . Lung cancer Father     Social History   Tobacco Use  . Smoking status: Never Smoker  . Smokeless tobacco: Never Used  Substance Use Topics  . Alcohol use: No  . Drug use: No    Home Medications Prior to  Admission medications   Medication Sig Start Date End Date Taking? Authorizing Provider  acetaminophen (TYLENOL) 325 MG tablet Take 2 tablets (650 mg total) by mouth every 4 (four) hours as needed for mild pain (or temp > 37.5 C (99.5 F)). 03/05/19  Yes Vassie Loll, MD  amLODipine (NORVASC) 5 MG tablet Take 5 mg by mouth daily. 07/24/19  Yes [provider]  aspirin 325 MG tablet Take 1 tablet (325 mg total) by mouth daily. 03/06/19  Yes Vassie Loll, MD  DULoxetine (CYMBALTA) 60 MG capsule Take 60 mg by mouth daily.   Yes [provider]  gabapentin (NEURONTIN) 100 MG  capsule Take 100 mg by mouth 3 (three) times daily.   Yes [provider]  hydrALAZINE (APRESOLINE) 25 MG tablet Take 3 tablets (75 mg total) by mouth every 8 (eight) hours. 03/05/19  Yes Vassie Loll, MD  lisinopril (ZESTRIL) 30 MG tablet Take 1 tablet (30 mg total) by mouth daily. 03/06/19  Yes Vassie Loll, MD  Melatonin 3 MG TABS Take 3 mg by mouth at bedtime.    Yes [provider]  metFORMIN (GLUCOPHAGE-XR) 500 MG 24 hr tablet Take 1 tablet (500 mg total) by mouth 2 (two) times a day. 03/05/19  Yes Vassie Loll, MD  Multiple Vitamins-Minerals (CEROVITE SENIOR) TABS Take 1 tablet by mouth daily.   Yes [provider]  omega-3 fish oil (MAXEPA) 1000 MG CAPS capsule Take 1,000 mg by mouth daily.   Yes [provider]  omeprazole (PRILOSEC OTC) 20 MG tablet Take 20 mg by mouth daily.   Yes [provider]  polyethylene glycol powder (GLYCOLAX/MIRALAX) 17 GM/SCOOP powder Take 17 g by mouth daily as needed for mild constipation.   Yes [provider]  predniSONE (DELTASONE) 10 MG tablet Take 3 pills po daily.  Monitor sugars daily Patient taking differently: Take 30 mg by mouth daily. Monitor sugars daily 08/12/19  Yes Sater, Pearletha Furl, MD  sitaGLIPtin (JANUVIA) 50 MG tablet Take 50 mg by mouth daily.   Yes [provider]    Allergies    Patient has no known allergies.  Review of Systems   Review of Systems  Constitutional: Negative for appetite change and fatigue.  HENT: Negative for congestion, ear discharge and sinus pressure.   Eyes: Negative for discharge.  Respiratory: Negative for cough.   Cardiovascular: Negative for chest pain.  Gastrointestinal: Positive for abdominal pain, diarrhea and vomiting.  Genitourinary: Negative for frequency and hematuria.  Musculoskeletal: Negative for back pain.  Skin: Negative for rash.  Neurological: Negative for seizures and headaches.  Psychiatric/Behavioral: Negative for  hallucinations.    Physical Exam Updated Vital Signs BP (!) 109/56   Pulse 98   Temp 99.1 F (37.3 C)   Resp 14   SpO2 98%   Physical Exam Vitals and nursing note reviewed.  Constitutional:      Appearance: She is well-developed.     Comments: Mild lethargic  HENT:     Head: Normocephalic.     Nose: Nose normal.  Eyes:     General: No scleral icterus.    Conjunctiva/sclera: Conjunctivae normal.  Neck:     Thyroid: No thyromegaly.  Cardiovascular:     Rate and Rhythm: Normal rate and regular rhythm.     Heart sounds: No murmur. No friction rub. No gallop.   Pulmonary:     Breath sounds: No stridor. No wheezing or rales.  Chest:     Chest wall: No tenderness.  Abdominal:     General: There is distension.     Tenderness: There is abdominal tenderness. There is no rebound.  Musculoskeletal:        General: Normal range of motion.     Cervical back: Neck supple.  Lymphadenopathy:     Cervical: No cervical adenopathy.  Skin:    Findings: No erythema or rash.  Neurological:     Motor: No abnormal muscle tone.     Coordination: Coordination normal.     Comments: Oriented to person place but lethargic  Psychiatric:        Behavior: Behavior normal.     ED Results / Procedures / Treatments   Labs (all labs ordered are listed, but only abnormal results are displayed) Labs Reviewed  CBC WITH DIFFERENTIAL/PLATELET - Abnormal; Notable for the following components:      Result Value   WBC 26.8 (*)    HCT 47.1 (*)    MCHC 29.3 (*)    Neutro Abs 22.5 (*)    Monocytes Absolute 1.3 (*)    All other components within normal limits  COMPREHENSIVE METABOLIC PANEL - Abnormal; Notable for the following components:   Potassium 5.6 (*)    CO2 15 (*)    Glucose, Bld 249 (*)    BUN 75 (*)    Creatinine, Ser 1.60 (*)    Total Protein 5.8 (*)    Albumin 3.1 (*)    AST 48 (*)    GFR calc non Af Amer 31 (*)    GFR calc Af Amer 36 (*)    Anion gap 18 (*)    All other  components within normal limits  LACTIC ACID, PLASMA - Abnormal; Notable for the following components:   Lactic Acid, Venous 6.4 (*)    All other components within normal limits  LACTIC ACID, PLASMA - Abnormal; Notable for the following components:   Lactic Acid, Venous 6.0 (*)    All other components within normal limits  URINALYSIS, ROUTINE W REFLEX MICROSCOPIC - Abnormal; Notable for the following components:   APPearance TURBID (*)    Ketones, ur 5 (*)    Protein, ur 100 (*)    Leukocytes,Ua MODERATE (*)    WBC, UA >50 (*)    Bacteria, UA MANY (*)    RBC / HPF >50 (*)    All other components within normal limits  CULTURE, BLOOD (ROUTINE X 2)  CULTURE, BLOOD (ROUTINE X 2)  URINE CULTURE  RESPIRATORY PANEL BY RT PCR (FLU A&B, COVID)  LIPASE, BLOOD  PROTIME-INR  APTT    EKG EKG Interpretation  Date/Time:  Monday September 16 2019 08:16:40 EST Ventricular Rate:  110 PR Interval:    QRS Duration: 79 QT Interval:  320 QTC Calculation: 433 R Axis:   53 Text Interpretation: Sinus tachycardia Probable left atrial enlargement Low voltage, extremity leads Confirmed by Bethann Berkshire [phone]) on 09/16/2019 9:26:52 AM   Radiology DG Chest Port 1 View  Result Date: 09/16/2019 CLINICAL DATA:  Vomiting. EXAM: PORTABLE CHEST 1 VIEW COMPARISON:  Chest radiograph 02/28/2019 FINDINGS: A loop recorder projects over the left chest. Overlying cardiac monitoring leads. Surgical clips project over the lower neck. Heart size within normal limits. There is no airspace consolidation within the lungs. No evidence of pleural effusion or pneumothorax. No acute bony abnormality. IMPRESSION: No evidence of acute cardiopulmonary abnormality. Electronically Signed   By: Jackey Loge DO   On: 09/16/2019 08:52    Procedures Procedures (including critical care time)  Medications Ordered in ED Medications  sodium chloride 0.9 % bolus 2,000 mL (0 mLs Intravenous Stopped 09/16/19 0936)  cefTRIAXone  (ROCEPHIN) 2 g in sodium chloride 0.9 % 100 mL IVPB (0 g Intravenous Stopped 09/16/19 1000)  sodium chloride 0.9 % bolus 1,000 mL (1,000 mLs Intravenous New Bag/Given 09/16/19 3810)    ED Course  I have reviewed the triage vital signs and the nursing notes.  Pertinent labs & imaging results that were available during my care of the patient were reviewed by me and considered in my medical decision making (see chart for details).    MDM Rules/Calculators/A&P     CHA2DS2/VAS Stroke Risk Points      N/A >= 2 Points: High Risk  1 - 1.99 Points: Medium Risk  0 Points: Low Risk    A final score could not be computed because of missing components.: Last  Change: N/A     This score determines the patient's risk of having a stroke if the  patient has atrial fibrillation.      This score is not applicable to this patient. Components are not  calculated.                  CRITICAL CARE Performed by: Milton Ferguson Total critical care time: 45 minutes Critical care time was exclusive of separately billable procedures and treating other patients. Critical care was necessary to treat or prevent imminent or life-threatening deterioration. Critical care was time spent personally by me on the following activities: development of treatment plan with patient and/or surrogate as well as nursing, discussions with consultants, evaluation of patient's response to treatment, examination of patient, obtaining history from patient or surrogate, ordering and performing treatments and interventions, ordering and review of laboratory studies, ordering and review of radiographic studies, pulse oximetry and re-evaluation of patient's condition. Patient with sepsis and urinary tract infection.  She is admitted to critical care Final Clinical Impression(s) / ED Diagnoses Final diagnoses:  Acute sepsis [facility])    Rx / DC Orders ED Discharge Orders    None       Milton Ferguson, MD 09/16/19 1024

## 2019-09-16 NOTE — H&P (Addendum)
NAME:  Janice NimsCarol T Hunkele, MRN:  [id number], DOB:  12/14/1945, LOS: 0 ADMISSION DATE:  09/16/2019, CONSULTATION DATE: 09/16/2019 REFERRING MD: Estell HarpinZammit  CHIEF COMPLAINT: Urinary tract infection  Brief History   73 year old female past medical history of CKD stage II, type 2 diabetes, chronic pain, gastroesophageal reflux, hypertension, hyperlipidemia, multiple sclerosis currently lives in a skilled nursing facility.  Presents with nausea abdominal pain, suprapubic pain.  History of present illness    73 year old female past medical history of CKD stage II, type 2 diabetes, chronic pain,, gastroesophageal reflux disease, hypertension, hyperlipidemia, multiple sclerosis lives in a skilled nursing facility.  Patient complains of nausea vomiting and lower abdominal suprapubic pain.  Patient was found to be hypotensive upon presentation to the emergency room with a systolic blood pressure in the 80s.  She received 3 L of fluid resuscitation as well as broad-spectrum antibiotics.  In and out catheterization in the emergency room revealed pus coming from her urethra as well as possible feculent matter.  Urinalysis was turbid positive for blood, many bacteria as well as white blood cells.  Due to patient's hypotension as well as lactic acidosis of 6.4, procalcitonin 3.1 pulmonary critical care was asked for evaluation and admission to the intensive care unit due to severe sepsis.  Past Medical History   Past Medical History:  Diagnosis Date  . Chronic kidney disease (CKD), stage II (mild)   . Chronic pain   . DM type 2 (diabetes mellitus, type 2) (HCC)   . Fatty liver    per CT  . GERD (gastroesophageal reflux disease)   . History of hepatitis as a child    age 89 (jaundice)  . HTN (hypertension)   . Hyperlipidemia   . MS (multiple sclerosis) (HCC)   . Obesity   . Osteopenia   . Primary hyperparathyroidism (HCC)    s/p surgery removal of parathyroid (Dr. Sharl MaKerr, Dr. Gerrit FriendsGerkin)  . Vitamin D deficiency       Significant Hospital Events   ICU admission  Consults:  Critical care  Procedures:    Significant Diagnostic Tests:  CT abdomen pelvis: IMPRESSION: 1. Circumferential but asymmetric thickening of the urinary bladder associated with bilateral ureteral and right renal pelvic distension with perinephric stranding likely represents urinary tract infection, perhaps cystitis and pyelonephritis. Given asymmetric bladder wall thickening is cystoscopic and/or clinical follow-up may be helpful to exclude underlying lesion of the urinary bladder. Appearance of urinary bladder could also be related to some degree to neurogenic bladder which would account for ureteral distension. 2. Moderate distension of the gallbladder without pericholecystic stranding and with signs of numerous gallstones. No signs of biliary ductal dilation. 3. 7 mm left lower lobe pulmonary nodule. Non-contrast chest CT at 6-12 months is recommended. If the nodule is stable at time of repeat CT, then future CT at 18-24 months (from today's scan) is considered optional for low-risk patients, but is recommended for high-risk patients. This recommendation follows the consensus statement: Guidelines for Management of Incidental Pulmonary Nodules Detected on CT Images: From the Fleischner Society 2017; Radiology 2017; 284:228-243. Finding is however suggested on the MRI study of 2015, if there are prior chest CTs comparison with these could also be helpful. 4. Colonic diverticulosis without evidence of acute diverticulitis.  Micro Data:  Blood cultures: Urine cultures:  Influenza negative Covid negative  Antimicrobials:  Ceftriaxone x1 Cefepime, consult to pharmacy  Interim history/subjective:  Per HPI above  Objective   Blood pressure (!) 109/56, pulse 98, temperature 99.1 F (  37.3 C), resp. rate 14, SpO2 98 %.        Intake/Output Summary (Last 24 hours) at 09/16/2019 0958 Last data filed at  09/16/2019 0936 Gross per 24 hour  Intake 2000 ml  Output --  Net 2000 ml   There were no vitals filed for this visit.  Examination: General: Elderly female, chronically debilitated, resting in bed HENT: NCAT, sclera clear, tracking appropriately Lungs: Clear to auscultation bilaterally no crackles no wheeze Cardiovascular: Regular rate and rhythm, S1-S2 Abdomen: Mildly distended tender to palpation predominantly within the suprapubic area Extremities: No significant edema, muscle wasting present in the upper and lower extremities Neuro: Patient is able to move all 4 extremities on command, grip strength is decreased bilaterally, almost able to elicit clonus on occasion within the upper extremities but she does have a medical history of multiple sclerosis GU: Deferred, however per nursing staff at time of Foley insertion there was pus visible from urethra  Resolved Hospital Problem list     Assessment & Plan:   Severe sepsis secondary to urinary tract infection, likely gram-negative organism, history of E. coli UTI in the past.  CT evidence with cystitis and bladder wall thickening Lactic acidosis secondary to above Acute renal failure on top of chronic kidney disease stage II, likely prerenal renal failure secondary to above Hyperkalemia, secondary to acidosis and renal failure, secondary to above Nausea Plan: Admit to stepdown unit Trend lactate Change antimicrobials to cefepime as patient's from staff and previous resistant E. coli Follow-up culture results, change antimicrobials as necessary As needed Zofran for nausea Repeat labs in a.m. Additional liter of lactated Ringer's infused over the next couple hours Advance diet as tolerated Avoid Foley if possible As needed Tylenol for fever Likely stable for hospitalist service in stepdown unit for pickup tomorrow morning.  History of MS Possible reason for neurogenic bladder/issues with UTI. Also history of vesicovaginal  fistula with closure  Hyperlipidemia Hypertension  Type 2 diabetes SSI    Best practice:  Diet: Advance as tolerated Pain/Anxiety/Delirium protocol (if indicated): Not applicable VAP protocol (if indicated): Not ventilated DVT prophylaxis: Lovenox GI prophylaxis: H2 blocker Glucose control: SSI as needed Mobility: Bedrest Code Status: Full Family Communication: We will call and update son Disposition: Stepdown unit  Labs   CBC: Recent Labs  Lab 09/16/19 0825 09/16/19 1201  WBC 26.8* 23.8*  NEUTROABS 22.5*  --   HGB 13.8 11.9*  HCT 47.1* 38.6  MCV 99.2 94.8  PLT 348 182    Basic Metabolic Panel: Recent Labs  Lab 09/16/19 0825 09/16/19 1201  NA 136  --   K 5.6*  --   CL 103  --   CO2 15*  --   GLUCOSE 249*  --   BUN 75*  --   CREATININE 1.60* 1.45*  CALCIUM 9.4  --    GFR: CrCl cannot be calculated (Unknown ideal weight.). Recent Labs  Lab 09/16/19 0825 09/16/19 0828 09/16/19 0859 09/16/19 1102 09/16/19 1201  PROCALCITON  --   --   --   --  3.14  WBC 26.8*  --   --   --  23.8*  LATICACIDVEN  --  6.4* THIS TEST WAS ORDERED IN ERROR AND HAS BEEN CREDITED. 3.5* 2.7*    Liver Function Tests: Recent Labs  Lab 09/16/19 0825  AST 48*  ALT 39  ALKPHOS 111  BILITOT 1.0  PROT 5.8*  ALBUMIN 3.1*   Recent Labs  Lab 09/16/19 0825  LIPASE 23  No results for input(s): AMMONIA in the last 168 hours.  ABG No results found for: PHART, PCO2ART, PO2ART, HCO3, TCO2, ACIDBASEDEF, O2SAT   Coagulation Profile: Recent Labs  Lab 09/16/19 0859  INR 1.0    Cardiac Enzymes: No results for input(s): CKTOTAL, CKMB, CKMBINDEX, TROPONINI in the last 168 hours.  HbA1C: Hgb A1c MFr Bld  Date/Time Value Ref Range Status  03/01/2019 02:47 AM 5.6 4.8 - 5.6 % Final    Comment:    (NOTE) Pre diabetes:          5.7%-6.4% Diabetes:              >6.4% Glycemic control for   <7.0% adults with diabetes     CBG: No results for input(s): GLUCAP in the last  168 hours.  Review of Systems:   Review of Systems  Constitutional: Positive for malaise/fatigue. Negative for chills, fever and weight loss.  HENT: Negative for hearing loss, sore throat and tinnitus.   Eyes: Negative for blurred vision and double vision.  Respiratory: Negative for cough, hemoptysis, sputum production, shortness of breath, wheezing and stridor.   Cardiovascular: Negative for chest pain, palpitations, orthopnea, leg swelling and PND.  Gastrointestinal: Positive for abdominal pain, nausea and vomiting. Negative for constipation, diarrhea and heartburn.  Genitourinary: Negative for dysuria, hematuria and urgency.  Musculoskeletal: Negative for joint pain and myalgias.  Skin: Negative for itching and rash.  Neurological: Positive for weakness. Negative for dizziness, tingling and headaches.  Endo/Heme/Allergies: Negative for environmental allergies. Does not bruise/bleed easily.  Psychiatric/Behavioral: Negative for depression. The patient is not nervous/anxious and does not have insomnia.   All other systems reviewed and are negative.    Past Medical History  She,  has a past medical history of Chronic kidney disease (CKD), stage II (mild), Chronic pain, DM type 2 (diabetes mellitus, type 2) (HCC), Fatty liver, GERD (gastroesophageal reflux disease), History of hepatitis as a child, HTN (hypertension), Hyperlipidemia, MS (multiple sclerosis) (HCC), Obesity, Osteopenia, Primary hyperparathyroidism (HCC), and Vitamin D deficiency.   Surgical History    Past Surgical History:  Procedure Laterality Date  . ANTERIOR CRUCIATE LIGAMENT REPAIR Left   . LOOP RECORDER INSERTION N/A 03/05/2019   Procedure: LOOP RECORDER INSERTION;  Surgeon: Marinus Maw, MD;  Location: [facility] INVASIVE CV LAB;  Service: Cardiovascular;  Laterality: N/A;  . VESICOVAGINAL FISTULA CLOSURE W/ TAH       Social History   reports that she has never smoked. She has never used smokeless tobacco. She reports  that she does not drink alcohol or use drugs.   Family History   Her family history includes COPD in her father; Lung cancer in her father; Mental illness in her mother.   Allergies No Known Allergies   Home Medications  Prior to Admission medications   Medication Sig Start Date End Date Taking? Authorizing Provider  acetaminophen (TYLENOL) 325 MG tablet Take 2 tablets (650 mg total) by mouth every 4 (four) hours as needed for mild pain (or temp > 37.5 C (99.5 F)). 03/05/19   Vassie Loll, MD  amLODipine (NORVASC) 5 MG tablet Take 5 mg by mouth daily. 07/24/19   [provider]  aspirin 325 MG tablet Take 1 tablet (325 mg total) by mouth daily. 03/06/19   Vassie Loll, MD  hydrALAZINE (APRESOLINE) 25 MG tablet Take 3 tablets (75 mg total) by mouth every 8 (eight) hours. 03/05/19   Vassie Loll, MD  lisinopril (ZESTRIL) 30 MG tablet Take 1 tablet (30 mg  total) by mouth daily. 03/06/19   Vassie Loll, MD  Melatonin 3 MG TABS Take by mouth.    [provider]  metFORMIN (GLUCOPHAGE-XR) 500 MG 24 hr tablet Take 1 tablet (500 mg total) by mouth 2 (two) times a day. 03/05/19   Vassie Loll, MD  Multiple Vitamin (MULTIVITAMIN) capsule Take 1 capsule by mouth daily.    [provider]  nitrofurantoin, macrocrystal-monohydrate, (MACROBID) 100 MG capsule Take 100 mg by mouth 2 (two) times daily.    [provider]  omeprazole (PRILOSEC OTC) 20 MG tablet Take 20 mg by mouth daily.    [provider]  omeprazole (PRILOSEC) 20 MG capsule Take 20 mg by mouth daily. 07/24/19   [provider]  predniSONE (DELTASONE) 10 MG tablet Take 3 pills po daily.  Monitor sugars daily 08/12/19   Sater, Pearletha Furl, MD  sitaGLIPtin (JANUVIA) 50 MG tablet Take 50 mg by mouth daily.    [provider]  UNABLE TO FIND [employer] [phone]  (fish oils)    [provider]     This patient is critically ill with multiple organ system failure; which, requires  frequent high complexity decision making, assessment, support, evaluation, and titration of therapies.  Time dedicated to sepsis resuscitation in the emergency department. This was completed through the application of advanced monitoring technologies and extensive interpretation of multiple databases. During this encounter critical care time was devoted to patient care services described in this note for 32 minutes.  Josephine Igo, DO [facility] 09/16/2019 1:38 PM

## 2019-09-16 NOTE — ED Notes (Signed)
Gerald Stabs (son) called to update that mother Janice Little was being transferred from ED to ICU [id number]

## 2019-09-16 NOTE — ED Notes (Signed)
ED TO INPATIENT HANDOFF REPORT  ED Nurse Name and Phone #: [phone]  S Name/Age/Gender Janice Little [place] 73 y.o. female Room/Bed: WA11/WA11  Code Status   Code Status: Full Code  Home/SNF/Other Nursing Home Patient oriented to: self Is this baseline? Yes   Triage Complete: Triage complete  Chief Complaint UTI (urinary tract infection) [N39.0]  Triage Note Pt coming from [facility] Patient BIB EMS for abdominal pain N/V/D. Patient reportedly is COVID negative.     Allergies No Known Allergies  Level of Care/Admitting Diagnosis ED Disposition    ED Disposition Condition [facility] Area: [place] [[id number]]  Level of Care: Stepdown [14]  Admit to SDU based on following criteria: Hemodynamic compromise or significant risk of instability:  Patient requiring short term acute titration and management of vasoactive drips, and invasive monitoring (i.e., CVP and Arterial line).  Covid Evaluation: Confirmed COVID Negative  Diagnosis: UTI (urinary tract infection) [751025]  Admitting Physician: Garner Nash [[id number]]  Attending Physician: Garner Nash [[id number]]  Estimated length of stay: 3 - 4 days  Certification:: I certify this patient will need inpatient services for at least 2 midnights       B Medical/Surgery History Past Medical History:  Diagnosis Date  . Chronic kidney disease (CKD), stage II (mild)   . Chronic pain   . DM type 2 (diabetes mellitus, type 2) ([place])   . Fatty liver    per CT  . GERD (gastroesophageal reflux disease)   . History of hepatitis as a child    age 64 (jaundice)  . HTN (hypertension)   . Hyperlipidemia   . MS (multiple sclerosis) ([place])   . Obesity   . Osteopenia   . Primary hyperparathyroidism (HCC)    s/p surgery removal of parathyroid (Dr. Buddy Duty, Dr. Harlow Asa)  . Vitamin D deficiency    Past Surgical History:  Procedure Laterality Date  . ANTERIOR CRUCIATE LIGAMENT REPAIR Left   .  LOOP RECORDER INSERTION N/A 03/05/2019   Procedure: LOOP RECORDER INSERTION;  Surgeon: Evans Lance, MD;  Location: [facility];  Service: Cardiovascular;  Laterality: N/A;  . VESICOVAGINAL FISTULA CLOSURE W/ TAH       A IV Location/Drains/Wounds Patient Lines/Drains/Airways Status   Active Line/Drains/Airways    Name:   Placement date:   Placement time:   Site:   Days:   Peripheral IV 09/16/19 Right Antecubital   09/16/19    0852    Antecubital   less than 1   Pressure Injury 03/02/19 Other (Comment) Upper Stage II -  Partial thickness loss of dermis presenting as a shallow open ulcer with a red, pink wound bed without slough.   03/02/19    2030     198   Pressure Injury 03/02/19 Buttocks Mid;Lower Stage II -  Partial thickness loss of dermis presenting as a shallow open ulcer with a red, pink wound bed without slough.   03/02/19    2030     198          Intake/Output Last 24 hours  Intake/Output Summary (Last 24 hours) at 09/16/2019 1734 Last data filed at 09/16/2019 1000 Gross per 24 hour  Intake 2126.67 ml  Output --  Net 2126.67 ml    Labs/Imaging Results for orders placed or performed during the hospital encounter of 09/16/19 (from the past 48 hour(s))  CBC with Differential     Status: Abnormal   Collection Time: 09/16/19  8:25  AM  Result Value Ref Range   WBC 26.8 (H) 4.0 - 10.5 K/uL   RBC 4.75 3.87 - 5.11 MIL/uL   Hemoglobin 13.8 12.0 - 15.0 g/dL   HCT 16.1 (H) 09.6 - 04.5 %   MCV 99.2 80.0 - 100.0 fL   MCH 29.1 26.0 - 34.0 pg   MCHC 29.3 (L) 30.0 - 36.0 g/dL   RDW 40.9 81.1 - 91.4 %   Platelets 348 150 - 400 K/uL   nRBC 0.0 0.0 - 0.2 %   Neutrophils Relative % 62 %   Neutro Abs 22.5 (H) 1.7 - 7.7 K/uL   Band Neutrophils 22 %   Lymphocytes Relative 11 %   Lymphs Abs 2.9 0.7 - 4.0 K/uL   Monocytes Relative 5 %   Monocytes Absolute 1.3 (H) 0.1 - 1.0 K/uL   Eosinophils Relative 0 %   Eosinophils Absolute 0.0 0.0 - 0.5 K/uL   Basophils Relative 0 %    Basophils Absolute 0.0 0.0 - 0.1 K/uL   WBC Morphology INCREASED BANDS (>20% BANDS)    Abs Immature Granulocytes 0.00 0.00 - 0.07 K/uL    Comment: Performed at [facility], [address], [place], [place] [address]  Comprehensive metabolic panel     Status: Abnormal   Collection Time: 09/16/19  8:25 AM  Result Value Ref Range   Sodium 136 135 - 145 mmol/L   Potassium 5.6 (H) 3.5 - 5.1 mmol/L   Chloride 103 98 - 111 mmol/L   CO2 15 (L) 22 - 32 mmol/L   Glucose, Bld 249 (H) 70 - 99 mg/dL   BUN 75 (H) 8 - 23 mg/dL   Creatinine, Ser 6.21 (H) 0.44 - 1.00 mg/dL   Calcium 9.4 8.9 - 30.8 mg/dL   Total Protein 5.8 (L) 6.5 - 8.1 g/dL   Albumin 3.1 (L) 3.5 - 5.0 g/dL   AST 48 (H) 15 - 41 U/L   ALT 39 0 - 44 U/L   Alkaline Phosphatase 111 38 - 126 U/L   Total Bilirubin 1.0 0.3 - 1.2 mg/dL   GFR calc non Af Amer 31 (L) >60 mL/min   GFR calc Af Amer 36 (L) >60 mL/min   Anion gap 18 (H) 5 - 15    Comment: Performed at [facility], [address] [place], [place] [address]  Lipase     Status: None   Collection Time: 09/16/19  8:25 AM  Result Value Ref Range   Lipase 23 11 - 51 U/L    Comment: Performed at [facility], [address] [place], [place] [address]  Lactic acid, plasma     Status: Abnormal   Collection Time: 09/16/19  8:28 AM  Result Value Ref Range   Lactic Acid, Venous 6.4 (HH) 0.5 - 1.9 mmol/L    Comment: CRITICAL RESULT CALLED TO, READ BACK BY AND VERIFIED WITHScherry Ran RN AT [phone] 09/16/19 MULLINS,T Performed at [facility], [address], [place], [place] [address]   Lactic acid, plasma     Status: None   Collection Time: 09/16/19  8:59 AM  Result Value Ref Range   Lactic Acid, Venous  0.5 - 1.9 mmol/L    THIS TEST WAS ORDERED IN ERROR AND HAS BEEN CREDITED.    Comment: Performed at [facility], [address] [place], [place] [address] CORRECTED ON 12/14 AT 1102:  PREVIOUSLY REPORTED AS 6.0 CRITICAL VALUE NOTED.  VALUE IS CONSISTENT WITH  PREVIOUSLY REPORTED AND CALLED VALUE.   Urinalysis, Routine w reflex microscopic     Status: Abnormal   Collection Time: 09/16/19  8:59 AM  Result Value Ref Range   Color, Urine YELLOW YELLOW   APPearance TURBID (A) CLEAR   Specific Gravity, Urine 1.023 1.005 - 1.030   pH 7.0 5.0 - 8.0   Glucose, UA NEGATIVE NEGATIVE mg/dL   Hgb urine dipstick NEGATIVE NEGATIVE   Bilirubin Urine NEGATIVE NEGATIVE   Ketones, ur 5 (A) NEGATIVE mg/dL   Protein, ur 161100 (A) NEGATIVE mg/dL   Nitrite NEGATIVE NEGATIVE   Leukocytes,Ua MODERATE (A) NEGATIVE   WBC, UA >50 (H) 0 - 5 WBC/hpf   Bacteria, UA MANY (A) NONE SEEN   Squamous Epithelial / LPF 0-5 0 - 5   WBC Clumps PRESENT    RBC / HPF >50 (H) 0 - 5 RBC/hpf    Comment: Performed at [facility], [address] [place], [place] [address]  Protime-INR     Status: None   Collection Time: 09/16/19  8:59 AM  Result Value Ref Range   Prothrombin Time 13.2 11.4 - 15.2 seconds   INR 1.0 0.8 - 1.2    Comment: (NOTE) INR goal varies based on device and disease states. Performed at [facility], [address] [place], [place] [address]   APTT     Status: None   Collection Time: 09/16/19  8:59 AM  Result Value Ref Range   aPTT 24 24 - 36 seconds    Comment: Performed at [facility], [address] [place], [place] [address]  Respiratory Panel by RT PCR (Flu A&B, Covid) - Nasopharyngeal Swab     Status: None   Collection Time: 09/16/19  9:25 AM   Specimen: Nasopharyngeal Swab  Result Value Ref Range   SARS Coronavirus 2 by RT PCR NEGATIVE NEGATIVE    Comment: (NOTE) SARS-CoV-2 target nucleic acids are NOT DETECTED. The SARS-CoV-2 RNA is generally detectable in upper respiratoy specimens during the acute phase of infection. The lowest concentration of SARS-CoV-2 viral copies this assay can detect is 131 copies/mL. A  negative result does not preclude SARS-Cov-2 infection and should not be used as the sole basis for treatment or other patient management decisions. A negative result may occur with  improper specimen collection/handling, submission of specimen other than nasopharyngeal swab, presence of viral mutation(s) within the areas targeted by this assay, and inadequate number of viral copies (<131 copies/mL). A negative result must be combined with clinical observations, patient history, and epidemiological information. The expected result is Negative. Fact Sheet for Patients:  https://www.moore.com/https://www.fda.gov/media/142436/download Fact Sheet for Healthcare Providers:  https://www.young.biz/https://www.fda.gov/media/142435/download This test is not yet ap proved or cleared by the [place] FDA and  has been authorized for detection and/or diagnosis of SARS-CoV-2 by FDA under an Emergency Use Authorization (EUA). This EUA will remain  in effect (meaning this test can be used) for the duration of the COVID-19 declaration under Section 564(b)(1) of the Act, 21 U.S.C. section 360bbb-3(b)(1), unless the authorization is terminated or revoked sooner.    Influenza A by PCR NEGATIVE NEGATIVE   Influenza B by PCR NEGATIVE NEGATIVE    Comment: (NOTE) The Xpert Xpress SARS-CoV-2/FLU/RSV assay is intended as an aid in  the diagnosis of influenza from Nasopharyngeal swab specimens and  should not be used as a sole basis for treatment. Nasal washings and  aspirates are unacceptable for Xpert Xpress SARS-CoV-2/FLU/RSV  testing. Fact Sheet  for Patients: https://www.moore.com/ Fact Sheet for Healthcare Providers: https://www.young.biz/ This test is not yet approved or cleared by the [place] FDA and  has been authorized for detection and/or diagnosis of SARS-CoV-2 by  FDA under an Emergency Use Authorization (EUA). This EUA will remain  in effect (meaning this test can be used) for the duration  of the  Covid-19 declaration under Section 564(b)(1) of the Act, 21  U.S.C. section 360bbb-3(b)(1), unless the authorization is  terminated or revoked. Performed at [facility], [address], [place], [place] [address]   Lactic acid, plasma     Status: Abnormal   Collection Time: 09/16/19 11:02 AM  Result Value Ref Range   Lactic Acid, Venous 3.5 (HH) 0.5 - 1.9 mmol/L    Comment: CRITICAL VALUE NOTED.  VALUE IS CONSISTENT WITH PREVIOUSLY REPORTED AND CALLED VALUE. Performed at [facility], [address] [place], [place] [address]   Lactate     Status: Abnormal   Collection Time: 09/16/19 12:01 PM  Result Value Ref Range   Lactic Acid, Venous 2.7 (HH) 0.5 - 1.9 mmol/L    Comment: CRITICAL VALUE NOTED.  VALUE IS CONSISTENT WITH PREVIOUSLY REPORTED AND CALLED VALUE. Performed at [facility], [address], [place], [place] [address]   CBC     Status: Abnormal   Collection Time: 09/16/19 12:01 PM  Result Value Ref Range   WBC 23.8 (H) 4.0 - 10.5 K/uL   RBC 4.07 3.87 - 5.11 MIL/uL   Hemoglobin 11.9 (L) 12.0 - 15.0 g/dL   HCT 86.5 78.4 - 69.6 %   MCV 94.8 80.0 - 100.0 fL   MCH 29.2 26.0 - 34.0 pg   MCHC 30.8 30.0 - 36.0 g/dL   RDW 29.5 28.4 - 13.2 %   Platelets 325 150 - 400 K/uL   nRBC 0.0 0.0 - 0.2 %    Comment: Performed at [facility], [address], [place], [place] [address]  Creatinine, serum     Status: Abnormal   Collection Time: 09/16/19 12:01 PM  Result Value Ref Range   Creatinine, Ser 1.45 (H) 0.44 - 1.00 mg/dL   GFR calc non Af Amer 35 (L) >60 mL/min   GFR calc Af Amer 41 (L) >60 mL/min    Comment: Performed at [facility], [address] [place], [place] [address]  Procalcitonin     Status: None   Collection Time: 09/16/19 12:01 PM  Result Value Ref Range   Procalcitonin 3.14 ng/mL    Comment:        Interpretation: PCT > 2 ng/mL: Systemic infection  (sepsis) is likely, unless other causes are known. (NOTE)       Sepsis PCT Algorithm           Lower Respiratory Tract                                      Infection PCT Algorithm    ----------------------------     ----------------------------         PCT < 0.25 ng/mL                PCT < 0.10 ng/mL         Strongly encourage             Strongly discourage   discontinuation of antibiotics    initiation of  antibiotics    ----------------------------     -----------------------------       PCT 0.25 - 0.50 ng/mL            PCT 0.10 - 0.25 ng/mL               OR       >80% decrease in PCT            Discourage initiation of                                            antibiotics      Encourage discontinuation           of antibiotics    ----------------------------     -----------------------------         PCT >= 0.50 ng/mL              PCT 0.26 - 0.50 ng/mL               AND       <80% decrease in PCT              Encourage initiation of                                             antibiotics       Encourage continuation           of antibiotics    ----------------------------     -----------------------------        PCT >= 0.50 ng/mL                  PCT > 0.50 ng/mL               AND         increase in PCT                  Strongly encourage                                      initiation of antibiotics    Strongly encourage escalation           of antibiotics                                     -----------------------------                                           PCT <= 0.25 ng/mL                                                 OR                                        >  80% decrease in PCT                                     Discontinue / Do not initiate                                             antibiotics Performed at [facility], [address] [place], [place] [address]   Lactic acid, plasma     Status: Abnormal   Collection Time: 09/16/19  2:09  PM  Result Value Ref Range   Lactic Acid, Venous 2.0 (HH) 0.5 - 1.9 mmol/L    Comment: CRITICAL VALUE NOTED.  VALUE IS CONSISTENT WITH PREVIOUSLY REPORTED AND CALLED VALUE. Performed at [facility], [address] [place], [place] [address]   Basic metabolic panel     Status: Abnormal   Collection Time: 09/16/19  2:09 PM  Result Value Ref Range   Sodium 142 135 - 145 mmol/L   Potassium 5.1 3.5 - 5.1 mmol/L   Chloride 111 98 - 111 mmol/L   CO2 20 (L) 22 - 32 mmol/L   Glucose, Bld 167 (H) 70 - 99 mg/dL   BUN 72 (H) 8 - 23 mg/dL   Creatinine, Ser 0.981.50 (H) 0.44 - 1.00 mg/dL   Calcium 8.3 (L) 8.9 - 10.3 mg/dL   GFR calc non Af Amer 34 (L) >60 mL/min   GFR calc Af Amer 39 (L) >60 mL/min   Anion gap 11 5 - 15    Comment: Performed at [facility], [address] [place], [place] [address]   CT ABDOMEN PELVIS WO CONTRAST  Result Date: 09/16/2019 CLINICAL DATA:  Abdominal distension EXAM: CT ABDOMEN AND PELVIS WITHOUT CONTRAST TECHNIQUE: Multidetector CT imaging of the abdomen and pelvis was performed following the standard protocol without IV contrast. COMPARISON:  MRI evaluation of August of 2015. FINDINGS: Lower chest: Left lower lobe pulmonary nodule (image 25, series 6) 7 mm. No signs of consolidation or evidence of pleural effusion. Hepatobiliary: Moderate to mark distension of the gallbladder with numerous stones and sludge layering dependently. No signs of pericholecystic stranding however. No signs of focal hepatic lesion. Pancreas: Unremarkable. No pancreatic ductal dilatation or surrounding inflammatory changes. Spleen: Normal in size without focal abnormality. Adrenals/Urinary Tract: Normal adrenal glands. Mild cortical loss, symmetric of the kidneys bilaterally. Mild-to-moderate ureteral distension. Distension of the renal pelvis particularly on the right with extrarenal pelves bilaterally. No signs of hydronephrosis. Right-sided perinephric  stranding. No visible intrarenal or ureter calculus. Asymmetric thickening of the urinary bladder favors the dome of the urinary bladder, associated with Perivesicle stranding. Stomach/Bowel: Normal appearance of the appendix. No signs of bowel obstruction or acute bowel process. Signs of colonic diverticulosis. Vascular/Lymphatic: Calcific atherosclerotic changes throughout the abdominal aorta. No signs of adenopathy in the upper abdomen or in the retroperitoneum. Reproductive: Post hysterectomy. No signs of pelvic adenopathy or adnexal mass. Other: No sign of free air. No focal fluid collection. Musculoskeletal: No signs of acute bone finding or destructive bone process. IMPRESSION: 1. Circumferential but asymmetric thickening of the urinary bladder associated with bilateral ureteral and right renal pelvic distension with perinephric stranding likely represents urinary tract infection, perhaps cystitis and pyelonephritis. Given asymmetric bladder wall thickening is cystoscopic and/or clinical follow-up may be  helpful to exclude underlying lesion of the urinary bladder. Appearance of urinary bladder could also be related to some degree to neurogenic bladder which would account for ureteral distension. 2. Moderate distension of the gallbladder without pericholecystic stranding and with signs of numerous gallstones. No signs of biliary ductal dilation. 3. 7 mm left lower lobe pulmonary nodule. Non-contrast chest CT at 6-12 months is recommended. If the nodule is stable at time of repeat CT, then future CT at 18-24 months (from today's scan) is considered optional for low-risk patients, but is recommended for high-risk patients. This recommendation follows the consensus statement: Guidelines for Management of Incidental Pulmonary Nodules Detected on CT Images: From the Fleischner Society 2017; Radiology 2017; 284:228-243. Finding is however suggested on the MRI study of 2015, if there are prior chest CTs comparison  with these could also be helpful. 4. Colonic diverticulosis without evidence of acute diverticulitis. Aortic Atherosclerosis (ICD10-I70.0). Electronically Signed   By: Donzetta Kohut M.D.   On: 09/16/2019 10:51   DG Chest Port 1 View  Result Date: 09/16/2019 CLINICAL DATA:  Vomiting. EXAM: PORTABLE CHEST 1 VIEW COMPARISON:  Chest radiograph 02/28/2019 FINDINGS: A loop recorder projects over the left chest. Overlying cardiac monitoring leads. Surgical clips project over the lower neck. Heart size within normal limits. There is no airspace consolidation within the lungs. No evidence of pleural effusion or pneumothorax. No acute bony abnormality. IMPRESSION: No evidence of acute cardiopulmonary abnormality. Electronically Signed   By: Jackey Loge DO   On: 09/16/2019 08:52    Pending Labs Unresulted Labs (From admission, onward)    Start     Ordered   09/17/19 0500  CBC  Tomorrow morning,   R     09/16/19 1138   09/17/19 0500  Basic metabolic panel  Tomorrow morning,   R     09/16/19 1138   09/17/19 0500  Magnesium  Tomorrow morning,   R     09/16/19 1138   09/17/19 0500  Phosphorus  Tomorrow morning,   R     09/16/19 1138   09/16/19 1138  Lactic acid, plasma  STAT Now then every 3 hours,   R (with STAT occurrences)     09/16/19 1138   09/16/19 1102  Lactic acid, plasma  Now then every 2 hours,   STAT     09/16/19 1101   09/16/19 0828  Blood Culture (routine x 2)  BLOOD CULTURE X 2,   STAT     09/16/19 0828   09/16/19 0828  Urine culture  ONCE - STAT,   STAT     09/16/19 0828          Vitals/Pain Today's Vitals   09/16/19 1645 09/16/19 1700 09/16/19 1715 09/16/19 1730  BP: (!) 137/58 135/61 (!) 131/58 130/60  Pulse: (!) 107 (!) 105 (!) 105 (!) 108  Resp: 16 (!) 8 (!) 6 15  Temp:      SpO2: 98% 98% 97% 98%  PainSc:        Isolation Precautions No active isolations  Medications Medications  heparin injection 5,000 Units (5,000 Units Subcutaneous Given 09/16/19 1428)   acetaminophen (TYLENOL) tablet 650 mg (650 mg Oral Given 09/16/19 1210)  ondansetron (ZOFRAN) injection 4 mg (has no administration in time range)  famotidine (PEPCID) tablet 20 mg (20 mg Oral Given 09/16/19 1210)  lactated ringers infusion ( Intravenous New Bag/Given 09/16/19 1426)  ceFEPIme (MAXIPIME) 2 g in sodium chloride 0.9 % 100 mL IVPB (2 g Intravenous New  Bag/Given 09/16/19 1427)  sodium chloride 0.9 % bolus 2,000 mL (0 mLs Intravenous Stopped 09/16/19 0936)  cefTRIAXone (ROCEPHIN) 2 g in sodium chloride 0.9 % 100 mL IVPB (0 g Intravenous Stopped 09/16/19 1000)  sodium chloride 0.9 % bolus 1,000 mL (0 mLs Intravenous Stopped 09/16/19 1119)    Mobility non-ambulatory     Focused Assessments   , Pulmonary Assessment Handoff:  Lung sounds:   O2 Device: Room Air        R Recommendations: See Admitting Provider Note  Report given to:   Additional Notes:

## 2019-09-16 NOTE — ED Notes (Signed)
Provider notified of Lab value Lactic = 6.4

## 2019-09-16 NOTE — ED Notes (Signed)
Patient dropped sats to 43% RN to bedside and woke patient up sats came up to 99% on room air.  Patient sat upright and 2L [place] placed VSS will continue to monitor

## 2019-09-16 NOTE — Progress Notes (Signed)
Notified bedside nurse of need to draw repeat lactic acid @ 1028.

## 2019-09-16 NOTE — Progress Notes (Addendum)
Pharmacy Antibiotic Note  Janice Little is a 73 y.o. female admitted on 09/16/2019 with UTI.  Pharmacy has been consulted for cefepime dosing.  In and out catheterization in the emergency room revealed pus coming from her urethra as well as possible feculent matter. Sepsis w/ hypotension, LA 6.5, PCT 3.1. ct: UTI, perhaps cystitis & pyelonephritis.   Plan: Cefepime 2 gm IV q12 for UTI w/sepsis & CrCl 34 ml/min F/u renal fxn, culture data    Temp (24hrs), Avg:99.1 F (37.3 C), Min:99.1 F (37.3 C), Max:99.1 F (37.3 C)  Recent Labs  Lab 09/16/19 0825 09/16/19 0828 09/16/19 0859 09/16/19 1102 09/16/19 1201  WBC 26.8*  --   --   --  23.8*  CREATININE 1.60*  --   --   --  1.45*  LATICACIDVEN  --  6.4* THIS TEST WAS ORDERED IN ERROR AND HAS BEEN CREDITED. 3.5* 2.7*    CrCl cannot be calculated (Unknown ideal weight.).    No Known Allergies Antimicrobials this admission:  12/14 CTX x 1  12/14 Cefepime Dose adjustments this admission:   Microbiology results:  12/14 Flu A/B neg 12/14 UCx: ip 12/14 BCx2: ip 12/14 covid neg PTA: 12/22/18 UCx: E coli. R amp, amp/sulb, trim/sulfa.   Thank you for allowing pharmacy to be a part of this patient's care.  Eudelia Bunch, Pharm.D [phone] 09/16/2019 1:58 PM

## 2019-09-16 NOTE — Progress Notes (Signed)
Notified provider of need to order repeat lactic acid. ° °

## 2019-09-16 NOTE — ED Notes (Signed)
RN attempted to call both patient's emergency contacts without success. HIPAA compliant message left on voicemail

## 2019-09-17 DIAGNOSIS — N10 Acute tubulo-interstitial nephritis: Secondary | ICD-10-CM

## 2019-09-17 DIAGNOSIS — A419 Sepsis, unspecified organism: Secondary | ICD-10-CM

## 2019-09-17 DIAGNOSIS — R197 Diarrhea, unspecified: Secondary | ICD-10-CM

## 2019-09-17 DIAGNOSIS — N390 Urinary tract infection, site not specified: Secondary | ICD-10-CM

## 2019-09-17 LAB — BASIC METABOLIC PANEL
Anion gap: 12 (ref 5–15)
BUN: 72 mg/dL — ABNORMAL HIGH (ref 8–23)
CO2: 17 mmol/L — ABNORMAL LOW (ref 22–32)
Calcium: 8.4 mg/dL — ABNORMAL LOW (ref 8.9–10.3)
Chloride: 111 mmol/L (ref 98–111)
Creatinine, Ser: 1.46 mg/dL — ABNORMAL HIGH (ref 0.44–1.00)
GFR calc Af Amer: 40 mL/min — ABNORMAL LOW (ref >60)
GFR calc non Af Amer: 35 mL/min — ABNORMAL LOW (ref >60)
Glucose, Bld: 109 mg/dL — ABNORMAL HIGH (ref 70–99)
Potassium: 5.2 mmol/L — ABNORMAL HIGH (ref 3.5–5.1)
Sodium: 140 mmol/L (ref 135–145)

## 2019-09-17 LAB — CBC
HCT: 40.2 % (ref 36.0–46.0)
Hemoglobin: 12.7 g/dL (ref 12.0–15.0)
MCH: 30 pg (ref 26.0–34.0)
MCHC: 31.6 g/dL (ref 30.0–36.0)
MCV: 95 fL (ref 80.0–100.0)
Platelets: 268 10*3/uL (ref 150–400)
RBC: 4.23 MIL/uL (ref 3.87–5.11)
RDW: 15.2 % (ref 11.5–15.5)
WBC: 14.2 10*3/uL — ABNORMAL HIGH (ref 4.0–10.5)
nRBC: 0 % (ref 0.0–0.2)

## 2019-09-17 LAB — PHOSPHORUS: Phosphorus: 4.3 mg/dL (ref 2.5–4.6)

## 2019-09-17 LAB — GLUCOSE, CAPILLARY: Glucose-Capillary: 164 mg/dL — ABNORMAL HIGH (ref 70–99)

## 2019-09-17 LAB — MAGNESIUM: Magnesium: 1.9 mg/dL (ref 1.7–2.4)

## 2019-09-17 MED ORDER — INSULIN ASPART 100 UNIT/ML ~~LOC~~ SOLN
0.0000 [IU] | Freq: Three times a day (TID) | SUBCUTANEOUS | Status: DC
  Administered 2019-09-18: 17:00:00 3 [IU] via SUBCUTANEOUS
  Administered 2019-09-19 – 2019-09-20 (×2): 2 [IU] via SUBCUTANEOUS

## 2019-09-17 MED ORDER — LACTATED RINGERS IV SOLN: INTRAVENOUS | Status: DC

## 2019-09-17 MED ORDER — AMLODIPINE BESYLATE 5 MG PO TABS
5.0000 mg | ORAL_TABLET | Freq: Every day | ORAL | Status: DC
  Administered 2019-09-17 – 2019-09-23 (×7): 5 mg via ORAL
  Filled 2019-09-17 (×7): qty 1

## 2019-09-17 MED ORDER — INSULIN ASPART 100 UNIT/ML ~~LOC~~ SOLN: 0.0000 [IU] | Freq: Every day | SUBCUTANEOUS | Status: DC

## 2019-09-17 MED ORDER — HYDRALAZINE HCL 20 MG/ML IJ SOLN
10.0000 mg | Freq: Four times a day (QID) | INTRAMUSCULAR | Status: DC | PRN
  Administered 2019-09-18 – 2019-09-19 (×4): 10 mg via INTRAVENOUS
  Filled 2019-09-17 (×5): qty 1

## 2019-09-17 MED ORDER — FAMOTIDINE 20 MG PO TABS
20.0000 mg | ORAL_TABLET | Freq: Every day | ORAL | Status: DC
  Administered 2019-09-18 – 2019-09-24 (×7): 20 mg via ORAL
  Filled 2019-09-17 (×7): qty 1

## 2019-09-17 NOTE — Progress Notes (Signed)
Pepcid renal dose adjustment: Pepcid 20 po bid changed to 20 po qday for CrCl 30 ml/min  Eudelia Bunch, Pharm.D [phone] 09/17/2019 11:12 AM

## 2019-09-17 NOTE — Progress Notes (Signed)
The patient was being changed by nurse and tech and when she was rolled a very thin liquid substance that appeared and smelt like stool came from patient. It is unclear if this came from her vagina or rectum and her pure wick was saturated with the same liquid. The fluid was brown, and had a very strong odor. This was an issue that was also discussed during her admission. Will continue to assess and will update on-coming nurse of status.

## 2019-09-17 NOTE — Progress Notes (Signed)
PROGRESS NOTE    Janice Little  MGN:[id number] DOB: 1946-05-27 DOA: 09/16/2019 PCP: Juluis Rainier, MD    Brief Narrative:  73 year old nursing home resident with PMH of CKD stage II, type 2 diabetes, chronic pain, GERD, hypertension, multiple sclerosis, prior CVA admitted with abdominal pain, nausea, diarrhea   Assessment & Plan:   Active Problems:   UTI (urinary tract infection)  Sepsis secondary to UTI Questionable infectious diarrhea CT abdomen pelvis showed bilateral ureteral and right renal pelvic distention with perinephric stranding likely representing cystitis and pyelonephritis.  Questionable neurogenic bladder per imaging. Urine culture growing Proteus mirabilis.  Blood cultures pending Continue cefepime pending susceptibility Lactic acidosis on admission resolving.  Leukocytosis improving. We will get stool C. difficile toxin diarrhea on presentation and her being a nursing home resident  AKI With history of CKD stage II Baseline creatinine appears to be 1.1 Creatinine elevated to 1.6 on admission Likely in setting of sepsis/prerenal Continue IV fluids Avoid nephrotoxic drugs Renally dose all medications  Incidental pulmonary nodule On CT A/P.  Follow-up recommended at 6 to 12 months as outpatient.  Multiple sclerosis Not on any medications at home  Diabetes mellitus Hold Metformin, sitagliptin  Hypertension Due to sepsis, hold amlodipine, hydralazine, lisinopril for now If persistently hypertensive, will add antihypertensives as appropriate   DVT prophylaxis: Heparin SQ Code Status: Full code  Family Communication: Discussed care with patient.  Attempt to call son unsuccessful. Disposition Plan: Transfer back to SNF pending medical stability   Consultants:   Patient initially seen by critical care stepdown today  Procedures:  None  Antimicrobials:   Cefepime   Subjective: Admitted overnight with sepsis secondary to UTI.  Patient  reports feeling fair this morning.  Reports having diarrhea and abdominal pain.  She wanted to have some breakfast.  Patient gave permission to speak with her son.  Objective: Vitals:   09/17/19 0900 09/17/19 1100 09/17/19 1200 09/17/19 1500  BP:   (!) 142/57   Pulse:   99   Resp:   18   Temp: 97.6 F (36.4 C) 98.4 F (36.9 C)  98.2 F (36.8 C)  TempSrc: Oral Axillary  Axillary  SpO2:   99%   Weight:      Height:        Intake/Output Summary (Last 24 hours) at 09/17/2019 1650 Last data filed at 09/17/2019 1322 Gross per 24 hour  Intake 392.99 ml  Output 200 ml  Net 192.99 ml   Filed Weights   09/16/19 1819 09/17/19 0500  Weight: 63.8 kg 64.6 kg    Examination:  General exam: Appears calm and comfortable, dry oral mucosa Respiratory system: Clear to auscultation. Respiratory effort normal. Cardiovascular system: S1 & S2 heard, RRR. No JVD. No pedal edema. Gastrointestinal system: Abdomen is nondistended, soft, diffusely tender. Normal bowel sounds heard. Central nervous system: Alert and oriented.  Strength decreased in bilateral lower extremities likely due to poor effort and prolonged immobility Extremities: Slightly weaker on left upper extremity than right (per and this is baseline for years) Skin: No obvious rashes Psychiatry: Judgement and insight appear poor. Mood & affect appropriate.    Data Reviewed: I have personally reviewed following labs and imaging studies  CBC: Recent Labs  Lab 09/16/19 0825 09/16/19 1201 09/17/19 0146  WBC 26.8* 23.8* 14.2*  NEUTROABS 22.5*  --   --   HGB 13.8 11.9* 12.7  HCT 47.1* 38.6 40.2  MCV 99.2 94.8 95.0  PLT 348 325 268   Basic Metabolic Panel:  Recent Labs  Lab 09/16/19 0825 09/16/19 1201 09/16/19 1409 09/17/19 0146  NA 136  --  142 140  K 5.6*  --  5.1 5.2*  CL 103  --  111 111  CO2 15*  --  20* 17*  GLUCOSE 249*  --  167* 109*  BUN 75*  --  72* 72*  CREATININE 1.60* 1.45* 1.50* 1.46*  CALCIUM 9.4  --   8.3* 8.4*  MG  --   --   --  1.9  PHOS  --   --   --  4.3   GFR: Estimated Creatinine Clearance: 30 mL/min (A) (by C-G formula based on SCr of 1.46 mg/dL (H)). Liver Function Tests: Recent Labs  Lab 09/16/19 0825  AST 48*  ALT 39  ALKPHOS 111  BILITOT 1.0  PROT 5.8*  ALBUMIN 3.1*   Recent Labs  Lab 09/16/19 0825  LIPASE 23   No results for input(s): AMMONIA in the last 168 hours. Coagulation Profile: Recent Labs  Lab 09/16/19 0859  INR 1.0   Cardiac Enzymes: No results for input(s): CKTOTAL, CKMB, CKMBINDEX, TROPONINI in the last 168 hours. BNP (last 3 results) No results for input(s): PROBNP in the last 8760 hours. HbA1C: No results for input(s): HGBA1C in the last 72 hours. CBG: No results for input(s): GLUCAP in the last 168 hours. Lipid Profile: No results for input(s): CHOL, HDL, LDLCALC, TRIG, CHOLHDL, LDLDIRECT in the last 72 hours. Thyroid Function Tests: No results for input(s): TSH, T4TOTAL, FREET4, T3FREE, THYROIDAB in the last 72 hours. Anemia Panel: No results for input(s): VITAMINB12, FOLATE, FERRITIN, TIBC, IRON, RETICCTPCT in the last 72 hours. Sepsis Labs: Recent Labs  Lab 09/16/19 0859 09/16/19 1102 09/16/19 1201 09/16/19 1409  PROCALCITON  --   --  3.14  --   LATICACIDVEN THIS TEST WAS ORDERED IN ERROR AND HAS BEEN CREDITED. 3.5* 2.7* 2.0*    Recent Results (from the past 240 hour(s))  Blood Culture (routine x 2)     Status: None (Preliminary result)   Collection Time: 09/16/19  8:59 AM   Specimen: BLOOD  Result Value Ref Range Status   Specimen Description   Final    BLOOD BLOOD RIGHT FOREARM Performed at [facility] [address] [place], [place] [address]    Special Requests   Final    BOTTLES DRAWN AEROBIC AND ANAEROBIC Blood Culture adequate volume Performed at [facility] [address], [place], [place] [address]    Culture   Final    NO GROWTH 1 DAY Performed at [facility], [place] [address], [place], [place] [address]    Report Status PENDING  Incomplete  Blood Culture (routine x 2)     Status: None (Preliminary result)   Collection Time: 09/16/19  8:59 AM   Specimen: BLOOD  Result Value Ref Range Status   Specimen Description   Final    BLOOD RIGHT ANTECUBITAL Performed at [facility] [address], [place], [place] [address]    Special Requests   Final    BOTTLES DRAWN AEROBIC AND ANAEROBIC Blood Culture results may not be optimal due to an excessive volume of blood received in culture bottles Performed at [facility] [address] [place], [place] [address]    Culture   Final    NO GROWTH 1 DAY Performed at [facility], [place] [address] [place], [place] [address]    Report Status PENDING  Incomplete  Urine culture     Status: Abnormal (Preliminary result)   Collection Time: 09/16/19  8:59 AM   Specimen: In/Out Cath Urine  Result Value Ref Range Status   Specimen Description   Final    IN/OUT CATH URINE Performed at [facility], [address] [place], [place] [address]    Special Requests   Final    NONE Performed at [facility], [address] [place], [place] [address]    Culture (A)  Final    >=100,000 COLONIES/mL PROTEUS MIRABILIS CULTURE REINCUBATED FOR BETTER GROWTH SUSCEPTIBILITIES TO FOLLOW Performed at [facility], [address] [place], [place] [address]    Report Status PENDING  Incomplete  Respiratory Panel by RT PCR (Flu A&B, Covid) - Nasopharyngeal Swab     Status: None   Collection Time: 09/16/19  9:25 AM   Specimen: Nasopharyngeal Swab  Result Value Ref Range Status   SARS Coronavirus 2 by RT PCR NEGATIVE NEGATIVE Final    Comment: (NOTE) SARS-CoV-2 target nucleic acids are NOT DETECTED. The SARS-CoV-2 RNA is generally detectable in upper respiratoy specimens during the acute phase of infection. The  lowest concentration of SARS-CoV-2 viral copies this assay can detect is 131 copies/mL. A negative result does not preclude SARS-Cov-2 infection and should not be used as the sole basis for treatment or other patient management decisions. A negative result may occur with  improper specimen collection/handling, submission of specimen other than nasopharyngeal swab, presence of viral mutation(s) within the areas targeted by this assay, and inadequate number of viral copies (<131 copies/mL). A negative result must be combined with clinical observations, patient history, and epidemiological information. The expected result is Negative. Fact Sheet for Patients:  https://www.moore.com/https://www.fda.gov/media/142436/download Fact Sheet for Healthcare Providers:  https://www.young.biz/https://www.fda.gov/media/142435/download This test is not yet ap proved or cleared by the [place] FDA and  has been authorized for detection and/or diagnosis of SARS-CoV-2 by FDA under an Emergency Use Authorization (EUA). This EUA will remain  in effect (meaning this test can be used) for the duration of the COVID-19 declaration under Section 564(b)(1) of the Act, 21 U.S.C. section 360bbb-3(b)(1), unless the authorization is terminated or revoked sooner.    Influenza A by PCR NEGATIVE NEGATIVE Final   Influenza B by PCR NEGATIVE NEGATIVE Final    Comment: (NOTE) The Xpert Xpress SARS-CoV-2/FLU/RSV assay is intended as an aid in  the diagnosis of influenza from Nasopharyngeal swab specimens and  should not be used as a sole basis for treatment. Nasal washings and  aspirates are unacceptable for Xpert Xpress SARS-CoV-2/FLU/RSV  testing. Fact Sheet for Patients: https://www.moore.com/https://www.fda.gov/media/142436/download Fact Sheet for Healthcare Providers: https://www.young.biz/https://www.fda.gov/media/142435/download This test is not yet approved or cleared by the [place] FDA and  has been authorized for detection and/or diagnosis of SARS-CoV-2 by  FDA under an Emergency  Use Authorization (EUA). This EUA will remain  in effect (meaning this test can be used) for the duration of the  Covid-19 declaration under Section 564(b)(1) of the Act, 21  U.S.C. section 360bbb-3(b)(1), unless the authorization is  terminated or revoked. Performed at [facility], [address] [place], [place] [address]   MRSA PCR Screening     Status: None   Collection Time: 09/16/19  6:59 PM   Specimen: Nasal Mucosa; Nasopharyngeal  Result Value Ref Range Status   MRSA by PCR NEGATIVE NEGATIVE Final    Comment:        The GeneXpert MRSA  Assay (FDA approved for NASAL specimens only), is one component of a comprehensive MRSA colonization surveillance program. It is not intended to diagnose MRSA infection nor to guide or monitor treatment for MRSA infections. Performed at [facility], [address] [place], [place] [address]          Radiology Studies: CT ABDOMEN PELVIS WO CONTRAST  Result Date: 09/16/2019 CLINICAL DATA:  Abdominal distension EXAM: CT ABDOMEN AND PELVIS WITHOUT CONTRAST TECHNIQUE: Multidetector CT imaging of the abdomen and pelvis was performed following the standard protocol without IV contrast. COMPARISON:  MRI evaluation of August of 2015. FINDINGS: Lower chest: Left lower lobe pulmonary nodule (image 25, series 6) 7 mm. No signs of consolidation or evidence of pleural effusion. Hepatobiliary: Moderate to mark distension of the gallbladder with numerous stones and sludge layering dependently. No signs of pericholecystic stranding however. No signs of focal hepatic lesion. Pancreas: Unremarkable. No pancreatic ductal dilatation or surrounding inflammatory changes. Spleen: Normal in size without focal abnormality. Adrenals/Urinary Tract: Normal adrenal glands. Mild cortical loss, symmetric of the kidneys bilaterally. Mild-to-moderate ureteral distension. Distension of the renal pelvis particularly on the right with  extrarenal pelves bilaterally. No signs of hydronephrosis. Right-sided perinephric stranding. No visible intrarenal or ureter calculus. Asymmetric thickening of the urinary bladder favors the dome of the urinary bladder, associated with Perivesicle stranding. Stomach/Bowel: Normal appearance of the appendix. No signs of bowel obstruction or acute bowel process. Signs of colonic diverticulosis. Vascular/Lymphatic: Calcific atherosclerotic changes throughout the abdominal aorta. No signs of adenopathy in the upper abdomen or in the retroperitoneum. Reproductive: Post hysterectomy. No signs of pelvic adenopathy or adnexal mass. Other: No sign of free air. No focal fluid collection. Musculoskeletal: No signs of acute bone finding or destructive bone process. IMPRESSION: 1. Circumferential but asymmetric thickening of the urinary bladder associated with bilateral ureteral and right renal pelvic distension with perinephric stranding likely represents urinary tract infection, perhaps cystitis and pyelonephritis. Given asymmetric bladder wall thickening is cystoscopic and/or clinical follow-up may be helpful to exclude underlying lesion of the urinary bladder. Appearance of urinary bladder could also be related to some degree to neurogenic bladder which would account for ureteral distension. 2. Moderate distension of the gallbladder without pericholecystic stranding and with signs of numerous gallstones. No signs of biliary ductal dilation. 3. 7 mm left lower lobe pulmonary nodule. Non-contrast chest CT at 6-12 months is recommended. If the nodule is stable at time of repeat CT, then future CT at 18-24 months (from today's scan) is considered optional for low-risk patients, but is recommended for high-risk patients. This recommendation follows the consensus statement: Guidelines for Management of Incidental Pulmonary Nodules Detected on CT Images: From the Fleischner Society 2017; Radiology 2017; 284:228-243. Finding is  however suggested on the MRI study of 2015, if there are prior chest CTs comparison with these could also be helpful. 4. Colonic diverticulosis without evidence of acute diverticulitis. Aortic Atherosclerosis (ICD10-I70.0). Electronically Signed   By: Donzetta KohutGeoffrey  Wile M.D.   On: 09/16/2019 10:51   DG Chest Port 1 View  Result Date: 09/16/2019 CLINICAL DATA:  Vomiting. EXAM: PORTABLE CHEST 1 VIEW COMPARISON:  Chest radiograph 02/28/2019 FINDINGS: A loop recorder projects over the left chest. Overlying cardiac monitoring leads. Surgical clips project over the lower neck. Heart size within normal limits. There is no airspace consolidation within the lungs. No evidence of pleural effusion or pneumothorax. No acute bony abnormality. IMPRESSION: No evidence of acute cardiopulmonary abnormality. Electronically Signed   By: Ronaldo MiyamotoKyle  Golden DO   On: 09/16/2019 08:52     Scheduled Meds: . Chlorhexidine Gluconate Cloth  6 each Topical Daily  . [START ON 09/18/2019] famotidine  20 mg Oral Daily  . heparin  5,000 Units Subcutaneous Q8H   Continuous Infusions: . ceFEPime (MAXIPIME) IV Stopped (09/17/19 1125)  . lactated ringers 100 mL/hr at 09/17/19 1322     LOS: 1 day    Time spent: Spent more than 30 minutes in coordinating care for this patient including bedside patient care.   Liborio Nixon, MD [facility] If 7PM-7AM, please contact night-coverage 09/17/2019, 4:50 PM

## 2019-09-18 DIAGNOSIS — N390 Urinary tract infection, site not specified: Secondary | ICD-10-CM

## 2019-09-18 DIAGNOSIS — N10 Acute tubulo-interstitial nephritis: Secondary | ICD-10-CM

## 2019-09-18 DIAGNOSIS — N3001 Acute cystitis with hematuria: Secondary | ICD-10-CM

## 2019-09-18 DIAGNOSIS — A419 Sepsis, unspecified organism: Secondary | ICD-10-CM

## 2019-09-18 DIAGNOSIS — R197 Diarrhea, unspecified: Secondary | ICD-10-CM

## 2019-09-18 LAB — GLUCOSE, CAPILLARY
Glucose-Capillary: 116 mg/dL — ABNORMAL HIGH (ref 70–99)
Glucose-Capillary: 104 mg/dL — ABNORMAL HIGH (ref 70–99)
Glucose-Capillary: 167 mg/dL — ABNORMAL HIGH (ref 70–99)
Glucose-Capillary: 88 mg/dL (ref 70–99)

## 2019-09-18 LAB — CBC
HCT: 33.9 % — ABNORMAL LOW (ref 36.0–46.0)
Hemoglobin: 10.5 g/dL — ABNORMAL LOW (ref 12.0–15.0)
MCH: 29.5 pg (ref 26.0–34.0)
MCHC: 31 g/dL (ref 30.0–36.0)
MCV: 95.2 fL (ref 80.0–100.0)
Platelets: 231 10*3/uL (ref 150–400)
RBC: 3.56 MIL/uL — ABNORMAL LOW (ref 3.87–5.11)
RDW: 14.6 % (ref 11.5–15.5)
WBC: 11.9 10*3/uL — ABNORMAL HIGH (ref 4.0–10.5)
nRBC: 0 % (ref 0.0–0.2)

## 2019-09-18 LAB — BASIC METABOLIC PANEL
Anion gap: 11 (ref 5–15)
BUN: 47 mg/dL — ABNORMAL HIGH (ref 8–23)
CO2: 17 mmol/L — ABNORMAL LOW (ref 22–32)
Calcium: 8.4 mg/dL — ABNORMAL LOW (ref 8.9–10.3)
Chloride: 111 mmol/L (ref 98–111)
Creatinine, Ser: 1.03 mg/dL — ABNORMAL HIGH (ref 0.44–1.00)
GFR calc Af Amer: >60 mL/min (ref >60)
GFR calc non Af Amer: 53 mL/min — ABNORMAL LOW (ref >60)
Glucose, Bld: 140 mg/dL — ABNORMAL HIGH (ref 70–99)
Potassium: 4.1 mmol/L (ref 3.5–5.1)
Sodium: 139 mmol/L (ref 135–145)

## 2019-09-18 LAB — TYPE AND SCREEN
ABO/RH(D): O NEG
Antibody Screen: NEGATIVE

## 2019-09-18 LAB — HEMOGLOBIN AND HEMATOCRIT, BLOOD
HCT: 27.6 % — ABNORMAL LOW (ref 36.0–46.0)
Hemoglobin: 9 g/dL — ABNORMAL LOW (ref 12.0–15.0)

## 2019-09-18 LAB — ABO/RH: ABO/RH(D): O NEG

## 2019-09-18 MED ORDER — SODIUM CHLORIDE 0.9 % IV SOLN
1.0000 g | INTRAVENOUS | Status: DC
  Administered 2019-09-18 – 2019-09-22 (×5): 1 g via INTRAVENOUS
  Filled 2019-09-18 (×2): qty 10
  Filled 2019-09-18 (×3): qty 1
  Filled 2019-09-18: qty 10
  Filled 2019-09-18: qty 1

## 2019-09-18 MED ORDER — HYDRALAZINE HCL 25 MG PO TABS
25.0000 mg | ORAL_TABLET | Freq: Three times a day (TID) | ORAL | Status: DC
  Administered 2019-09-19: 05:00:00 25 mg via ORAL
  Filled 2019-09-18: qty 1

## 2019-09-18 NOTE — Progress Notes (Signed)
PROGRESS NOTE    Janice Little  ZOX:[id number] DOB: 02/01/1946 DOA: 09/16/2019 PCP: Juluis RainierBarnes, Elizabeth, MD    Brief Narrative:  73 year old nursing home resident with PMH of CKD stage II, type 2 diabetes, chronic pain, GERD, hypertension, multiple sclerosis, prior CVA admitted with abdominal pain, nausea, diarrhea   Assessment & Plan:   Active Problems:   Diabetes mellitus with diabetic neuropathy (HCC)   Essential hypertension   Acute sepsis (HCC)   Sepsis secondary to UTI (HCC)   Diarrhea  Sepsis secondary to UTI Questionable infectious diarrhea CT abdomen pelvis showed bilateral ureteral and right renal pelvic distention with perinephric stranding likely representing cystitis and pyelonephritis.  Questionable neurogenic bladder per imaging. Urine culture growing Proteus mirabilis.  We will switch cefepime to ceftriaxone.  Blood cultures pending Diarrhea present on admission has now resolved prior to getting the sample for C. difficile testing, therefore we will discontinue the order  Hematuria RN she had clots in her urine Hemodynamically stable after the episode Stat H&H showed about a gram drop in hemoglobin Likely in setting of infection Monitor closely for repeat episode Treat UTI as above  AKI With history of CKD stage II Baseline creatinine appears to be 1.1 Creatinine elevated to 1.6 on admission Likely in setting of sepsis/prerenal Creatinine improving Continue IV fluids Avoid nephrotoxic drugs Renally dose all medications  Incidental pulmonary nodule On CT A/P.  Follow-up recommended at 6 to 12 months as outpatient.  Multiple sclerosis Not on any medications at home  Diabetes mellitus Hold Metformin, sitagliptin  Hypertension BP elevated.  Restart home amlodipine and hydralazine at a lower dose Hold lisinopril given AKI   DVT prophylaxis: Heparin SQ Code Status: Full code  Family Communication: Discussed care with patient, no answer when  called son via telephone  Disposition Plan: Transfer back to SNF pending medical stability   Consultants:   Patient initially seen by critical care, then a stepdown   Procedures:  None  Antimicrobials:   ceftriaxone   Subjective: No acute overnight events.  Per staff she was confused this morning.  I saw her, she is awake and following commands appropriately.  Oriented to self and place.  She knows she is sick but is unable to provide details.  Objective: Vitals:   09/18/19 0352 09/18/19 0400 09/18/19 0500 09/18/19 0600  BP:  (!) 158/57 (!) 163/59 (!) 153/60  Pulse:  94 96 91  Resp:  11 10 10   Temp: 98.6 F (37 C)     TempSrc: Oral     SpO2:  99% 98% 98%  Weight:   66.1 kg   Height:        Intake/Output Summary (Last 24 hours) at 09/18/2019 0724 Last data filed at 09/18/2019 0600 Gross per 24 hour  Intake 1945.37 ml  Output 650 ml  Net 1295.37 ml   Filed Weights   09/16/19 1819 09/17/19 0500 09/18/19 0500  Weight: 63.8 kg 64.6 kg 66.1 kg    Examination:  General exam: Appears calm and comfortable, dry oral mucosa Respiratory system: Clear to auscultation. Respiratory effort normal. Cardiovascular system: S1 & S2 heard, RRR. No JVD. No pedal edema. Gastrointestinal system: Abdomen is nondistended, soft, diffusely tender. Normal bowel sounds heard. Central nervous system: Alert and oriented to self and place.  Strength decreased in bilateral lower extremities likely due to poor effort and prolonged immobility Extremities: Slightly weaker on left upper extremity than right (per and this is baseline for years) Skin: No obvious rashes Psychiatry: Judgement and  insight appear poor. Mood & affect appropriate.    Data Reviewed: I have personally reviewed following labs and imaging studies  CBC: Recent Labs  Lab 09/16/19 0825 09/16/19 1201 09/17/19 0146 09/18/19 0203  WBC 26.8* 23.8* 14.2* 11.9*  NEUTROABS 22.5*  --   --   --   HGB 13.8 11.9* 12.7 10.5*    HCT 47.1* 38.6 40.2 33.9*  MCV 99.2 94.8 95.0 95.2  PLT 348 325 268 231   Basic Metabolic Panel: Recent Labs  Lab 09/16/19 0825 09/16/19 1201 09/16/19 1409 09/17/19 0146 09/18/19 0203  NA 136  --  142 140 139  K 5.6*  --  5.1 5.2* 4.1  CL 103  --  111 111 111  CO2 15*  --  20* 17* 17*  GLUCOSE 249*  --  167* 109* 140*  BUN 75*  --  72* 72* 47*  CREATININE 1.60* 1.45* 1.50* 1.46* 1.03*  CALCIUM 9.4  --  8.3* 8.4* 8.4*  MG  --   --   --  1.9  --   PHOS  --   --   --  4.3  --    GFR: Estimated Creatinine Clearance: 42.5 mL/min (A) (by C-G formula based on SCr of 1.03 mg/dL (H)). Liver Function Tests: Recent Labs  Lab 09/16/19 0825  AST 48*  ALT 39  ALKPHOS 111  BILITOT 1.0  PROT 5.8*  ALBUMIN 3.1*   Recent Labs  Lab 09/16/19 0825  LIPASE 23   No results for input(s): AMMONIA in the last 168 hours. Coagulation Profile: Recent Labs  Lab 09/16/19 0859  INR 1.0   Cardiac Enzymes: No results for input(s): CKTOTAL, CKMB, CKMBINDEX, TROPONINI in the last 168 hours. BNP (last 3 results) No results for input(s): PROBNP in the last 8760 hours. HbA1C: No results for input(s): HGBA1C in the last 72 hours. CBG: Recent Labs  Lab 09/17/19 2140  GLUCAP 164*   Lipid Profile: No results for input(s): CHOL, HDL, LDLCALC, TRIG, CHOLHDL, LDLDIRECT in the last 72 hours. Thyroid Function Tests: No results for input(s): TSH, T4TOTAL, FREET4, T3FREE, THYROIDAB in the last 72 hours. Anemia Panel: No results for input(s): VITAMINB12, FOLATE, FERRITIN, TIBC, IRON, RETICCTPCT in the last 72 hours. Sepsis Labs: Recent Labs  Lab 09/16/19 0859 09/16/19 1102 09/16/19 1201 09/16/19 1409  PROCALCITON  --   --  3.14  --   LATICACIDVEN THIS TEST WAS ORDERED IN ERROR AND HAS BEEN CREDITED. 3.5* 2.7* 2.0*    Recent Results (from the past 240 hour(s))  Blood Culture (routine x 2)     Status: None (Preliminary result)   Collection Time: 09/16/19  8:59 AM   Specimen: BLOOD   Result Value Ref Range Status   Specimen Description   Final    BLOOD BLOOD RIGHT FOREARM Performed at [facility], [address] [place], [place] [address]    Special Requests   Final    BOTTLES DRAWN AEROBIC AND ANAEROBIC Blood Culture adequate volume Performed at [facility], [address], [place], [place] [address]    Culture   Final    NO GROWTH 1 DAY Performed at [facility], [address] [place], [place] [address]    Report Status PENDING  Incomplete  Blood Culture (routine x 2)     Status: None (Preliminary result)   Collection Time: 09/16/19  8:59 AM   Specimen: BLOOD  Result Value Ref Range Status   Specimen Description  Final    BLOOD RIGHT ANTECUBITAL Performed at [facility], [address] [place], [place] [address]    Special Requests   Final    BOTTLES DRAWN AEROBIC AND ANAEROBIC Blood Culture results may not be optimal due to an excessive volume of blood received in culture bottles Performed at [facility], [address] [place], [place] [address]    Culture   Final    NO GROWTH 1 DAY Performed at [facility], [address] [place], [place] [address]    Report Status PENDING  Incomplete  Urine culture     Status: Abnormal (Preliminary result)   Collection Time: 09/16/19  8:59 AM   Specimen: In/Out Cath Urine  Result Value Ref Range Status   Specimen Description   Final    IN/OUT CATH URINE Performed at [facility], [address] [place], [place] [address]    Special Requests   Final    NONE Performed at [facility], [address] [place], [place] [address]    Culture (A)  Final    >=100,000 COLONIES/mL PROTEUS MIRABILIS CULTURE REINCUBATED FOR BETTER GROWTH SUSCEPTIBILITIES TO FOLLOW Performed at [facility], [address] [place], [place] [address]    Report Status PENDING  Incomplete   Respiratory Panel by RT PCR (Flu A&B, Covid) - Nasopharyngeal Swab     Status: None   Collection Time: 09/16/19  9:25 AM   Specimen: Nasopharyngeal Swab  Result Value Ref Range Status   SARS Coronavirus 2 by RT PCR NEGATIVE NEGATIVE Final    Comment: (NOTE) SARS-CoV-2 target nucleic acids are NOT DETECTED. The SARS-CoV-2 RNA is generally detectable in upper respiratoy specimens during the acute phase of infection. The lowest concentration of SARS-CoV-2 viral copies this assay can detect is 131 copies/mL. A negative result does not preclude SARS-Cov-2 infection and should not be used as the sole basis for treatment or other patient management decisions. A negative result may occur with  improper specimen collection/handling, submission of specimen other than nasopharyngeal swab, presence of viral mutation(s) within the areas targeted by this assay, and inadequate number of viral copies (<131 copies/mL). A negative result must be combined with clinical observations, patient history, and epidemiological information. The expected result is Negative. Fact Sheet for Patients:  https://www.moore.com/https://www.fda.gov/media/142436/download Fact Sheet for Healthcare Providers:  https://www.young.biz/https://www.fda.gov/media/142435/download This test is not yet ap proved or cleared by the [place] FDA and  has been authorized for detection and/or diagnosis of SARS-CoV-2 by FDA under an Emergency Use Authorization (EUA). This EUA will remain  in effect (meaning this test can be used) for the duration of the COVID-19 declaration under Section 564(b)(1) of the Act, 21 U.S.C. section 360bbb-3(b)(1), unless the authorization is terminated or revoked sooner.    Influenza A by PCR NEGATIVE NEGATIVE Final   Influenza B by PCR NEGATIVE NEGATIVE Final    Comment: (NOTE) The Xpert Xpress SARS-CoV-2/FLU/RSV assay is intended as an aid in  the diagnosis of influenza from Nasopharyngeal swab specimens and  should not be used as a sole  basis for treatment. Nasal washings and  aspirates are unacceptable for Xpert Xpress SARS-CoV-2/FLU/RSV  testing. Fact Sheet for Patients: https://www.moore.com/https://www.fda.gov/media/142436/download Fact Sheet for Healthcare Providers: https://www.young.biz/https://www.fda.gov/media/142435/download This test is not yet approved or cleared by the [place] FDA and  has been authorized for detection and/or diagnosis of SARS-CoV-2 by  FDA under an Emergency Use Authorization (EUA). This EUA will remain  in effect (meaning this test can be used) for the duration of the  Covid-19 declaration under Section 564(b)(1) of the Act, 21  U.S.C. section 360bbb-3(b)(1), unless the authorization is  terminated or revoked. Performed at [facility], [address], [place], [place] [address]   MRSA PCR Screening     Status: None   Collection Time: 09/16/19  6:59 PM   Specimen: Nasal Mucosa; Nasopharyngeal  Result Value Ref Range Status   MRSA by PCR NEGATIVE NEGATIVE Final    Comment:        The GeneXpert MRSA Assay (FDA approved for NASAL specimens only), is one component of a comprehensive MRSA colonization surveillance program. It is not intended to diagnose MRSA infection nor to guide or monitor treatment for MRSA infections. Performed at [facility], [address] [place], [place] [address]          Radiology Studies: CT ABDOMEN PELVIS WO CONTRAST  Result Date: 09/16/2019 CLINICAL DATA:  Abdominal distension EXAM: CT ABDOMEN AND PELVIS WITHOUT CONTRAST TECHNIQUE: Multidetector CT imaging of the abdomen and pelvis was performed following the standard protocol without IV contrast. COMPARISON:  MRI evaluation of August of 2015. FINDINGS: Lower chest: Left lower lobe pulmonary nodule (image 25, series 6) 7 mm. No signs of consolidation or evidence of pleural effusion. Hepatobiliary: Moderate to mark distension of the gallbladder with numerous stones and sludge layering dependently.  No signs of pericholecystic stranding however. No signs of focal hepatic lesion. Pancreas: Unremarkable. No pancreatic ductal dilatation or surrounding inflammatory changes. Spleen: Normal in size without focal abnormality. Adrenals/Urinary Tract: Normal adrenal glands. Mild cortical loss, symmetric of the kidneys bilaterally. Mild-to-moderate ureteral distension. Distension of the renal pelvis particularly on the right with extrarenal pelves bilaterally. No signs of hydronephrosis. Right-sided perinephric stranding. No visible intrarenal or ureter calculus. Asymmetric thickening of the urinary bladder favors the dome of the urinary bladder, associated with Perivesicle stranding. Stomach/Bowel: Normal appearance of the appendix. No signs of bowel obstruction or acute bowel process. Signs of colonic diverticulosis. Vascular/Lymphatic: Calcific atherosclerotic changes throughout the abdominal aorta. No signs of adenopathy in the upper abdomen or in the retroperitoneum. Reproductive: Post hysterectomy. No signs of pelvic adenopathy or adnexal mass. Other: No sign of free air. No focal fluid collection. Musculoskeletal: No signs of acute bone finding or destructive bone process. IMPRESSION: 1. Circumferential but asymmetric thickening of the urinary bladder associated with bilateral ureteral and right renal pelvic distension with perinephric stranding likely represents urinary tract infection, perhaps cystitis and pyelonephritis. Given asymmetric bladder wall thickening is cystoscopic and/or clinical follow-up may be helpful to exclude underlying lesion of the urinary bladder. Appearance of urinary bladder could also be related to some degree to neurogenic bladder which would account for ureteral distension. 2. Moderate distension of the gallbladder without pericholecystic stranding and with signs of numerous gallstones. No signs of biliary ductal dilation. 3. 7 mm left lower lobe pulmonary nodule. Non-contrast chest CT  at 6-12 months is recommended. If the nodule is stable at time of repeat CT, then future CT at 18-24 months (from today's scan) is considered optional for low-risk patients, but is recommended for high-risk patients. This recommendation follows the consensus statement: Guidelines for Management of Incidental Pulmonary Nodules Detected on CT Images: From the Fleischner Society 2017; Radiology 2017; 284:228-243. Finding is however suggested on the MRI study of 2015, if there are prior chest CTs comparison with these could also be helpful. 4. Colonic diverticulosis without evidence of acute  diverticulitis. Aortic Atherosclerosis (ICD10-I70.0). Electronically Signed   By: Zetta Bills M.D.   On: 09/16/2019 10:51   DG Chest Port 1 View  Result Date: 09/16/2019 CLINICAL DATA:  Vomiting. EXAM: PORTABLE CHEST 1 VIEW COMPARISON:  Chest radiograph 02/28/2019 FINDINGS: A loop recorder projects over the left chest. Overlying cardiac monitoring leads. Surgical clips project over the lower neck. Heart size within normal limits. There is no airspace consolidation within the lungs. No evidence of pleural effusion or pneumothorax. No acute bony abnormality. IMPRESSION: No evidence of acute cardiopulmonary abnormality. Electronically Signed   By: Kellie Simmering DO   On: 09/16/2019 08:52     Scheduled Meds:  amLODipine  5 mg Oral Daily   Chlorhexidine Gluconate Cloth  6 each Topical Daily   famotidine  20 mg Oral Daily   heparin  5,000 Units Subcutaneous Q8H   insulin aspart  0-15 Units Subcutaneous TID WC   insulin aspart  0-5 Units Subcutaneous QHS   Continuous Infusions:  ceFEPime (MAXIPIME) IV Stopped (09/17/19 2220)   lactated ringers 100 mL/hr at 09/18/19 0600     LOS: 2 days    Time spent: Spent more than 30 minutes in coordinating care for this patient including bedside patient care.   Lucky Cowboy, MD [facility] If 7PM-7AM, please contact night-coverage 09/18/2019, 7:24 AM

## 2019-09-18 NOTE — Progress Notes (Signed)
Called to see patient for new onset confusion. Patient awakes to voice and is keenly responsive. States her birthday is 08/27/2005. States the year is 2014. Reoriented and patient did not retain reorientation information. Pupils equal, round, and responsive, has baseline weakness from past CVA and MS. NIH completed, lights turned on and patient reoriented. Patient sat up, CBG 116. Outside of orientation appears to be at her baseline mobility wise. No slurred speech, no newfacial drooping, slight turn down of left side lips noted in past assessments. Tongue symmetrical and able to puff up cheeks. Afebrile, vital signs within baseline range. MD paged.

## 2019-09-18 NOTE — Consult Note (Signed)
Urology Consult   Physician requesting consult: Raford PitcherPatel, Janki, MD  Reason for consult: Gross hematuria, UTI  History of Present Illness: Janice Little is a 73 y.o. female admitted to the ICU with sepsis secondary to urinary tract infection.  The patient developed gross hematuria upon anticoagulation with heparin.  Urologic consultation is requested.  She did have CT abdomen and pelvis at the time of admission 2 days ago.  This revealed mild hydroureteronephrosis bilaterally, with ureters somewhat dilated all the way down to the bladder.  Bladder wall was thickened.  There is no evidence of urolithiasis.  The patient does have multiple sclerosis.  She does not know of frequent urinary tract infections, but currently the patient is somewhat obtunded.  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  She denies kidney stones.  She is growing Proteus from her urine.  She is a nursing home resident.  Past Medical History:  Diagnosis Date  . Chronic kidney disease (CKD), stage II (mild)   . Chronic pain   . DM type 2 (diabetes mellitus, type 2) (HCC)   . Fatty liver    per CT  . GERD (gastroesophageal reflux disease)   . History of hepatitis as a child    age 89 (jaundice)  . HTN (hypertension)   . Hyperlipidemia   . MS (multiple sclerosis) (HCC)   . Obesity   . Osteopenia   . Primary hyperparathyroidism (HCC)    s/p surgery removal of parathyroid (Dr. Sharl MaKerr, Dr. Gerrit FriendsGerkin)  . Vitamin D deficiency     Past Surgical History:  Procedure Laterality Date  . ANTERIOR CRUCIATE LIGAMENT REPAIR Left   . LOOP RECORDER INSERTION N/A 03/05/2019   Procedure: LOOP RECORDER INSERTION;  Surgeon: Marinus Mawaylor, Gregg W, MD;  [facility];  Service: Cardiovascular;  Laterality: N/A;  . VESICOVAGINAL FISTULA CLOSURE W/ TAH       Current Hospital Medications: Scheduled Meds: . amLODipine  5 mg Oral  Daily  . Chlorhexidine Gluconate Cloth  6 each Topical Daily  . famotidine  20 mg Oral Daily  . [START ON 09/19/2019] hydrALAZINE  25 mg Oral Q8H  . insulin aspart  0-15 Units Subcutaneous TID WC  . insulin aspart  0-5 Units Subcutaneous QHS   Continuous Infusions: . cefTRIAXone (ROCEPHIN)  IV    . lactated ringers 100 mL/hr at 09/18/19 1528   PRN Meds:.acetaminophen, hydrALAZINE, ondansetron (ZOFRAN) IV  Allergies: No Known Allergies  Family History  Problem Relation Age of Onset  . Mental illness Mother   . COPD Father   . Lung cancer Father     Social History:  reports that she has never smoked. She has never used smokeless tobacco. She reports that she does not drink alcohol or use drugs.  ROS: A complete review of systems was performed.  All systems are negative except for pertinent findings as noted.  Physical Exam:  Vital signs in last 24 hours: Temp:  [97.9 F (36.6 C)-98.6 F (37 C)] 98.3 F (36.8 C) (12/16 1200) Pulse Rate:  [88-103] 96 (12/16 1600) Resp:  [9-18] 14 (12/16 1600) BP: (137-181)/(45-78) 168/76 (12/16 1600) SpO2:  [97 %-100 %] 99 % (12/16 1600) Weight:  [66.1 kg] 66.1 kg (12/16 0500) General: Somewhat obtunded, No acute distress HEENT: Normocephalic, atraumatic Neck: No JVD or lymphadenopathy Cardiovascular: Regular rate Lungs: Normal inspiratory and expiratory excursion Abdomen: Soft, nontender, nondistended, no abdominal masses Extremities:  No edema   Laboratory Data:  Recent Labs    09/16/19 0825 09/16/19 1201 09/17/19 0146 09/18/19 0203 09/18/19 1524  WBC 26.8* 23.8* 14.2* 11.9*  --   HGB 13.8 11.9* 12.7 10.5* 9.0*  HCT 47.1* 38.6 40.2 33.9* 27.6*  PLT 348 325 268 231  --     Recent Labs    09/16/19 0825 09/16/19 1201 09/16/19 1409 09/17/19 0146 09/18/19 0203  NA 136  --  142 140 139  K 5.6*  --  5.1 5.2* 4.1  CL 103  --  111 111 111  GLUCOSE 249*  --  167* 109* 140*  BUN 75*  --  72* 72* 47*  CALCIUM 9.4  --  8.3*  8.4* 8.4*  CREATININE 1.60* 1.45* 1.50* 1.46* 1.03*     Results for orders placed or performed during the hospital encounter of 09/16/19 (from the past 24 hour(s))  Glucose, capillary     Status: Abnormal   Collection Time: 09/17/19  9:40 PM  Result Value Ref Range   Glucose-Capillary 164 (H) 70 - 99 mg/dL  CBC     Status: Abnormal   Collection Time: 09/18/19  2:03 AM  Result Value Ref Range   WBC 11.9 (H) 4.0 - 10.5 K/uL   RBC 3.56 (L) 3.87 - 5.11 MIL/uL   Hemoglobin 10.5 (L) 12.0 - 15.0 g/dL   HCT 16.133.9 (L) 09.636.0 - 04.546.0 %   MCV 95.2 80.0 - 100.0 fL   MCH 29.5 26.0 - 34.0 pg   MCHC 31.0 30.0 - 36.0 g/dL   RDW 40.914.6 81.111.5 - 91.415.5 %   Platelets 231 150 - 400 K/uL   nRBC 0.0 0.0 - 0.2 %  Basic metabolic panel     Status: Abnormal   Collection Time: 09/18/19  2:03 AM  Result Value Ref Range   Sodium 139 135 - 145 mmol/L   Potassium 4.1 3.5 - 5.1 mmol/L   Chloride 111 98 - 111 mmol/L   CO2 17 (L) 22 - 32 mmol/L   Glucose, Bld 140 (H) 70 - 99 mg/dL   BUN 47 (H) 8 - 23 mg/dL   Creatinine, Ser 7.821.03 (H) 0.44 - 1.00 mg/dL   Calcium 8.4 (L) 8.9 - 10.3 mg/dL   GFR calc non Af Amer 53 (L) >60 mL/min   GFR calc Af Amer >60 >60 mL/min   Anion gap 11 5 - 15  Glucose, capillary     Status: Abnormal   Collection Time: 09/18/19  8:09 AM  Result Value Ref Range   Glucose-Capillary 116 (H) 70 - 99 mg/dL   Comment 1 Notify RN    Comment 2 Document in Chart   Glucose, capillary     Status: Abnormal   Collection Time: 09/18/19 12:05 PM  Result Value Ref Range   Glucose-Capillary 104 (H) 70 - 99 mg/dL   Comment 1 Notify RN    Comment 2 Document in Chart   Hemoglobin and hematocrit, blood     Status: Abnormal   Collection Time: 09/18/19  3:24 PM  Result Value Ref Range   Hemoglobin 9.0 (L) 12.0 - 15.0 g/dL   HCT 95.627.6 (L) 21.336.0 - 08.646.0 %  Type and screen [facility]     Status: None   Collection Time: 09/18/19  3:24 PM  Result Value Ref Range   ABO/RH(D) O NEG    Antibody  Screen NEG    Sample Expiration      09/21/2019,2359 Performed at [facility], [place] [address] [place], [place] [address]   Glucose, capillary     Status: Abnormal   Collection Time: 09/18/19  3:51 PM  Result Value Ref Range   Glucose-Capillary 167 (H) 70 - 99 mg/dL   Comment 1 Notify RN    Comment 2 Document in Chart    Recent Results (from the past 240 hour(s))  Blood Culture (routine x 2)     Status: None (Preliminary result)   Collection Time: 09/16/19  8:59 AM   Specimen: BLOOD  Result Value Ref Range Status   Specimen Description   Final    BLOOD BLOOD RIGHT FOREARM Performed at [facility] [address] [place], [place] [address]    Special Requests   Final    BOTTLES DRAWN AEROBIC AND ANAEROBIC Blood Culture adequate volume Performed at [facility] [address], [place], [place] [address]    Culture   Final    NO GROWTH 2 DAYS Performed at [facility] [address] [place], [place] [address]    Report Status PENDING  Incomplete  Blood Culture (routine x 2)     Status: None (Preliminary result)   Collection Time: 09/16/19  8:59 AM   Specimen: BLOOD  Result Value Ref Range Status   Specimen Description   Final    BLOOD RIGHT ANTECUBITAL Performed at [facility] [address], [place], [place] [address]    Special Requests   Final    BOTTLES DRAWN AEROBIC AND ANAEROBIC Blood Culture results may not be optimal due to an excessive volume of blood received in culture bottles Performed at [facility] [address] [place], [place] [address]    Culture   Final    NO GROWTH 2 DAYS Performed at [facility] [address] [place], [place] [address]    Report Status PENDING  Incomplete  Urine culture     Status: Abnormal (Preliminary result)   Collection Time: 09/16/19  8:59 AM   Specimen: In/Out Cath Urine  Result Value Ref Range Status    Specimen Description   Final    IN/OUT CATH URINE Performed at [facility] [address] [place], [place] [address]    Special Requests   Final    NONE Performed at [facility], [place] [address], [place], [place] [address]    Culture (A)  Final    >=100,000 COLONIES/mL PROTEUS MIRABILIS CULTURE REINCUBATED FOR BETTER GROWTH Performed at [facility], [place] [address] [place], [place] [address]    Report Status PENDING  Incomplete   Organism ID, Bacteria PROTEUS MIRABILIS (A)  Final      Susceptibility   Proteus mirabilis - MIC*    AMPICILLIN <=2 SENSITIVE Sensitive     CEFAZOLIN <=4 SENSITIVE Sensitive     CEFTRIAXONE <=1 SENSITIVE Sensitive     CIPROFLOXACIN >=4 RESISTANT Resistant     GENTAMICIN <=1 SENSITIVE Sensitive     IMIPENEM 2 SENSITIVE Sensitive     NITROFURANTOIN 128 RESISTANT Resistant     TRIMETH/SULFA >=320 RESISTANT Resistant     AMPICILLIN/SULBACTAM <=2 SENSITIVE Sensitive     PIP/TAZO <=4 SENSITIVE Sensitive     * >=100,000 COLONIES/mL PROTEUS MIRABILIS  Respiratory Panel by RT PCR (Flu A&B, Covid) - Nasopharyngeal Swab     Status: None   Collection Time: 09/16/19  9:25 AM   Specimen: Nasopharyngeal Swab  Result Value Ref  Range Status   SARS Coronavirus 2 by RT PCR NEGATIVE NEGATIVE Final    Comment: (NOTE) SARS-CoV-2 target nucleic acids are NOT DETECTED. The SARS-CoV-2 RNA is generally detectable in upper respiratoy specimens during the acute phase of infection. The lowest concentration of SARS-CoV-2 viral copies this assay can detect is 131 copies/mL. A negative result does not preclude SARS-Cov-2 infection and should not be used as the sole basis for treatment or other patient management decisions. A negative result may occur with  improper specimen collection/handling, submission of specimen other than nasopharyngeal swab, presence of viral mutation(s) within the areas targeted by this assay, and inadequate  number of viral copies (<131 copies/mL). A negative result must be combined with clinical observations, patient history, and epidemiological information. The expected result is Negative. Fact Sheet for Patients:  https://www.moore.com/ Fact Sheet for Healthcare Providers:  https://www.young.biz/ This test is not yet ap proved or cleared by the [place] FDA and  has been authorized for detection and/or diagnosis of SARS-CoV-2 by FDA under an Emergency Use Authorization (EUA). This EUA will remain  in effect (meaning this test can be used) for the duration of the COVID-19 declaration under Section 564(b)(1) of the Act, 21 U.S.C. section 360bbb-3(b)(1), unless the authorization is terminated or revoked sooner.    Influenza A by PCR NEGATIVE NEGATIVE Final   Influenza B by PCR NEGATIVE NEGATIVE Final    Comment: (NOTE) The Xpert Xpress SARS-CoV-2/FLU/RSV assay is intended as an aid in  the diagnosis of influenza from Nasopharyngeal swab specimens and  should not be used as a sole basis for treatment. Nasal washings and  aspirates are unacceptable for Xpert Xpress SARS-CoV-2/FLU/RSV  testing. Fact Sheet for Patients: https://www.moore.com/ Fact Sheet for Healthcare Providers: https://www.young.biz/ This test is not yet approved or cleared by the [place] FDA and  has been authorized for detection and/or diagnosis of SARS-CoV-2 by  FDA under an Emergency Use Authorization (EUA). This EUA will remain  in effect (meaning this test can be used) for the duration of the  Covid-19 declaration under Section 564(b)(1) of the Act, 21  U.S.C. section 360bbb-3(b)(1), unless the authorization is  terminated or revoked. Performed at [facility], [address] [place], [place] [address]   MRSA PCR Screening     Status: None   Collection Time: 09/16/19  6:59 PM   Specimen: Nasal Mucosa;  Nasopharyngeal  Result Value Ref Range Status   MRSA by PCR NEGATIVE NEGATIVE Final    Comment:        The GeneXpert MRSA Assay (FDA approved for NASAL specimens only), is one component of a comprehensive MRSA colonization surveillance program. It is not intended to diagnose MRSA infection nor to guide or monitor treatment for MRSA infections. Performed at [facility], [address] [place], [place] [address]     Renal Function: Recent Labs    09/16/19 0825 09/16/19 1201 09/16/19 1409 09/17/19 0146 09/18/19 0203  CREATININE 1.60* 1.45* 1.50* 1.46* 1.03*   Estimated Creatinine Clearance: 42.5 mL/min (A) (by C-G formula based on SCr of 1.03 mg/dL (H)).  Radiologic Imaging: I reviewed the patient's CT images  I independently reviewed the above imaging studies.  Impression/Assessment:  1.  Gross hematuria.  Most likely related to the patient's cystitis as well as anticoagulation.  Her heparin has been stopped.  She does have thickened bladder wall most likely related to a high pressure bladder from her MS.  2.  Mild bilateral hydroureteronephrosis, most likely  secondary to high-pressure bladder from her MS.  Her creatinine is normal, and I think that this is a finding that is present on CT but this may be long-term and I do not necessarily think this needs to be treated  3.  Proteus UTI.  No evidence of stone disease, treat with antibiotics  Plan:  1.  I did order catheter to be placed.  I doubt she will have significant clots, but irrigation can be carried out if necessary.  As urine clears, I think it is okay to stop/DC the catheter  2.  If urine clears as it should with antibiotic management, I do not think any further evaluation is necessary.  If it continues, cystoscopy may be necessary  3.  I will follow during her hospitalization

## 2019-09-18 NOTE — TOC Initial Note (Signed)
Transition of Care [facility]) - Initial/Assessment Note    Patient Details  Name: Janice Little MRN: [id number] Date of Birth: August 30, 1946  [facility] CM/SW Contact:    Lynnell Catalan, RN Phone Number: 09/18/2019, 2:07 PM  Clinical Narrative:                 Pt is a long term care resident at [facility]. Family did not hold pt's bed so will need to contact [facility] prior to DC to make sure bed is still available. TOC will continue to follow.  Expected Discharge Plan: [place]     Expected Discharge Plan and Services Expected Discharge Plan: [place] arrangements for the past 2 months: [place]  Prior Living Arrangements/Services Living arrangements for the past 2 months: [place]                     Activities of Daily Living Home Assistive Devices/Equipment: Blood pressure cuff, Grab bars around toilet, Grab bars in shower, Hand-held shower hose, Hospital bed, Wheelchair, CBG Meter, Eyeglasses([facility] has necessary equipment for their residents) ADL Screening (condition at time of admission) Patient's cognitive ability adequate to safely complete daily activities?: No Is the patient deaf or have difficulty hearing?: No Does the patient have difficulty seeing, even when wearing glasses/contacts?: No Does the patient have difficulty concentrating, remembering, or making decisions?: Yes Patient able to express need for assistance with ADLs?: No Does the patient have difficulty dressing or bathing?: Yes Independently performs ADLs?: No Communication: Independent Dressing (OT): Dependent Is this a change from baseline?: Change from baseline, expected to last >3 days Grooming: Dependent Is this a change from baseline?: Change from baseline, expected to last >3 days Feeding: Dependent Is this a change from baseline?: Change from baseline, expected to last >3 days Bathing:  Dependent Is this a change from baseline?: Change from baseline, expected to last >3 days Toileting: Dependent Is this a change from baseline?: Change from baseline, expected to last >3days In/Out Bed: Dependent Is this a change from baseline?: Change from baseline, expected to last >3 days Walks in Home: Dependent Is this a change from baseline?: Change from baseline, expected to last >3 days Does the patient have difficulty walking or climbing stairs?: Yes(secondary to weakness) Weakness of Legs: Both Weakness of Arms/Hands: Both     Admission diagnosis:  UTI (urinary tract infection) [N39.0] Acute sepsis ([place]) [A41.9] Patient Active Problem List   Diagnosis Date Noted  . Acute sepsis ([place])   . Sepsis secondary to UTI ([place])   . Diarrhea   . Elevated sed rate 08/12/2019  . Elevated C-reactive protein (CRP) 08/12/2019  . Left-sided neglect 08/01/2019  . Myalgia 08/01/2019  . Pressure injury of skin 03/04/2019  . Acute lower UTI   . Stroke ([place]) 03/02/2019  . Acute CVA (cerebrovascular accident) ([place]) 03/01/2019  . Hypertensive urgency 12/19/2018  . History of myelitis 12/26/2017  . History of optic neuritis 04/02/2015  . Diplopia 04/02/2015  . Urinary incontinence 04/02/2015  . Other fatigue 04/02/2015  . 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 Numbness 04/02/2015  . Ataxic gait 04/02/2015  . Essential tremor 04/02/2015  . PRIMARY HYPERPARATHYROIDISM 02/01/2008  . OSTEOPOROSIS 02/01/2008  . Diabetes mellitus with diabetic neuropathy ([place]) 01/31/2008  . Hyperlipidemia 01/31/2008  . Multiple sclerosis ([place]) 01/31/2008  . Optic neuritis 01/31/2008  . Essential hypertension 01/31/2008  . GERD 01/31/2008  PCP:  Juluis Rainier, MD Pharmacy:  No Pharmacies Listed    Social Determinants of Health (SDOH) Interventions    Readmission Risk Interventions Readmission Risk Prevention Plan 09/18/2019 12/24/2018  Post Dischage Appt - Complete   Medication Screening - Complete  Transportation Screening Complete Complete  PCP or Specialist Appt within 5-7 Days Complete -  Home Care Screening Complete -  Medication Review (RN CM) Complete -  Some recent data might be hidden

## 2019-09-19 ENCOUNTER — Ambulatory Visit (INDEPENDENT_AMBULATORY_CARE_PROVIDER_SITE_OTHER): Payer: Medicare Other | Admitting: *Deleted

## 2019-09-19 DIAGNOSIS — R310 Gross hematuria: Secondary | ICD-10-CM

## 2019-09-19 DIAGNOSIS — I639 Cerebral infarction, unspecified: Secondary | ICD-10-CM | POA: Diagnosis not present

## 2019-09-19 LAB — HEPATIC FUNCTION PANEL
ALT: 30 U/L (ref 0–44)
AST: 24 U/L (ref 15–41)
Albumin: 2.4 g/dL — ABNORMAL LOW (ref 3.5–5.0)
Alkaline Phosphatase: 109 U/L (ref 38–126)
Bilirubin, Direct: 0.1 mg/dL (ref 0.0–0.2)
Indirect Bilirubin: 0.4 mg/dL (ref 0.3–0.9)
Total Bilirubin: 0.5 mg/dL (ref 0.3–1.2)
Total Protein: 4.7 g/dL — ABNORMAL LOW (ref 6.5–8.1)

## 2019-09-19 LAB — GLUCOSE, CAPILLARY
Glucose-Capillary: 121 mg/dL — ABNORMAL HIGH (ref 70–99)
Glucose-Capillary: 97 mg/dL (ref 70–99)
Glucose-Capillary: 116 mg/dL — ABNORMAL HIGH (ref 70–99)
Glucose-Capillary: 104 mg/dL — ABNORMAL HIGH (ref 70–99)

## 2019-09-19 LAB — BASIC METABOLIC PANEL
Anion gap: 8 (ref 5–15)
BUN: 21 mg/dL (ref 8–23)
CO2: 22 mmol/L (ref 22–32)
Calcium: 8.1 mg/dL — ABNORMAL LOW (ref 8.9–10.3)
Chloride: 108 mmol/L (ref 98–111)
Creatinine, Ser: 0.59 mg/dL (ref 0.44–1.00)
GFR calc Af Amer: >60 mL/min (ref >60)
GFR calc non Af Amer: >60 mL/min (ref >60)
Glucose, Bld: 129 mg/dL — ABNORMAL HIGH (ref 70–99)
Potassium: 3.9 mmol/L (ref 3.5–5.1)
Sodium: 138 mmol/L (ref 135–145)

## 2019-09-19 LAB — CUP PACEART REMOTE DEVICE CHECK
Date Time Interrogation Session: 20201217171905
Implantable Pulse Generator Implant Date: 20200602

## 2019-09-19 LAB — HEMOGLOBIN AND HEMATOCRIT, BLOOD
HCT: 28 % — ABNORMAL LOW (ref 36.0–46.0)
Hemoglobin: 9 g/dL — ABNORMAL LOW (ref 12.0–15.0)

## 2019-09-19 MED ORDER — LABETALOL HCL 5 MG/ML IV SOLN
10.0000 mg | Freq: Once | INTRAVENOUS | Status: AC
  Administered 2019-09-19: 10 mg via INTRAVENOUS
  Filled 2019-09-19: qty 4

## 2019-09-19 MED ORDER — HYDRALAZINE HCL 25 MG PO TABS
25.0000 mg | ORAL_TABLET | Freq: Three times a day (TID) | ORAL | Status: DC
  Administered 2019-09-19 – 2019-09-20 (×3): 25 mg via ORAL
  Filled 2019-09-19 (×3): qty 1

## 2019-09-19 NOTE — Progress Notes (Signed)
RN attempted to place 81' foley which was unsuccessful. Pt pure wick replaced with 800 in output since beginning of shift. Urine is bright red in color with blood clots. RN will continue to monitor and converse with AM RN to attempt foley placement as well.

## 2019-09-19 NOTE — Progress Notes (Signed)
PROGRESS NOTE    Janice Little  ZHY:[id number] DOB: 11/09/1945 DOA: 09/16/2019 PCP: Juluis RainierBarnes, Elizabeth, MD    Brief Narrative:  73 year old nursing home resident with PMH of CKD stage II, type 2 diabetes, chronic pain, GERD, hypertension, multiple sclerosis, prior CVA admitted with abdominal pain, nausea, diarrhea   Assessment & Plan:   Active Problems:   Diabetes mellitus with diabetic neuropathy (HCC)   Essential hypertension   Acute sepsis (HCC)   Sepsis secondary to UTI (HCC)   Diarrhea  Sepsis secondary to UTI Questionable infectious diarrhea CT abdomen pelvis showed bilateral ureteral and right renal pelvic distention with perinephric stranding likely representing cystitis and pyelonephritis. No renal stones. Questionable neurogenic bladder per imaging. Lactic acidosis resolved Urine culture growing Proteus mirabilis. Blood cultures pending Continue ceftriaxone day 3/14   Gross hematuria Hemoglobin dropped from 13.8 on admission to 9 (though some of it is hemodilution with IV fluids) Hemodynamically stable Heparin for DVT prophylaxis discontinued  Likely in setting of urinary infection Urology on board: appreciate assistance   Somnolence  ?in setting of sepsis  She has a flat affect and appears depressed. ?depression playing a role   AKI With history of CKD stage II Baseline creatinine appears to be 1.1 Creatinine elevated to 1.6 on admission Likely in setting of sepsis/prerenal Now resolved  Incidental pulmonary nodule On CT A/P.  Follow-up recommended at 6 to 12 months as outpatient.  Multiple sclerosis Not on any medications at home  Diabetes mellitus Hold Metformin, sitagliptin  Hypertension BP elevated.  Restart home amlodipine and hydralazine at a lower dose Hold lisinopril given AKI   DVT prophylaxis: SCD/Compression stockings Code Status: Full code  Family Communication: Discussed care with patient, again attempted to call son with no  response  Disposition Plan: Transfer back to SNF pending medical stability   Consultants:   Patient initially seen by critical care, then a stepdown   Procedures:  None  Antimicrobials:   ceftriaxone   Subjective: No acute overnight events.  Patient seen and examined.  She is asleep but awakens on multiple verbal commands.  Complains of abdominal pain and points to right upper quadrant.  Reports diarrhea but she does not have any documented stools.   Objective: Vitals:   09/19/19 0300 09/19/19 0400 09/19/19 0500 09/19/19 0600  BP: (!) 164/67 (!) 170/63 (!) 184/76 (!) 173/65  Pulse: 92 88 94 94  Resp: 12 12 15 11   Temp:  98.3 F (36.8 C)    TempSrc:  Oral    SpO2: 98% 99% 98% 98%  Weight:   63.2 kg   Height:        Intake/Output Summary (Last 24 hours) at 09/19/2019 0747 Last data filed at 09/19/2019 0500 Gross per 24 hour  Intake 970.89 ml  Output 1700 ml  Net -729.11 ml   Filed Weights   09/17/19 0500 09/18/19 0500 09/19/19 0500  Weight: 64.6 kg 66.1 kg 63.2 kg    Examination:  General exam: Appears calm and comfortable, dry oral mucosa, pale appearing Respiratory system: Clear to auscultation. Respiratory effort normal. Cardiovascular system: S1 & S2 heard, RRR. No JVD. No pedal edema. Gastrointestinal system: Abdomen is nondistended, soft, ?  RUQ tenderness. Normal bowel sounds heard. Central nervous system: Asleep, awakens on verbal commands.  When awake, oriented to self and place.  Strength decreased in bilateral lower extremities likely due to poor effort and prolonged immobility Extremities: Slightly weaker on left upper extremity than right (per and this is baseline for years)  Skin: No obvious rashes Psychiatry: Judgement and insight appear poor.  Flat affect   Data Reviewed: I have personally reviewed following labs and imaging studies  CBC: Recent Labs  Lab 09/16/19 0825 09/16/19 1201 09/17/19 0146 09/18/19 0203 09/18/19 1524  WBC 26.8*  23.8* 14.2* 11.9*  --   NEUTROABS 22.5*  --   --   --   --   HGB 13.8 11.9* 12.7 10.5* 9.0*  HCT 47.1* 38.6 40.2 33.9* 27.6*  MCV 99.2 94.8 95.0 95.2  --   PLT 348 325 268 231  --    Basic Metabolic Panel: Recent Labs  Lab 09/16/19 0825 09/16/19 1201 09/16/19 1409 09/17/19 0146 09/18/19 0203  NA 136  --  142 140 139  K 5.6*  --  5.1 5.2* 4.1  CL 103  --  111 111 111  CO2 15*  --  20* 17* 17*  GLUCOSE 249*  --  167* 109* 140*  BUN 75*  --  72* 72* 47*  CREATININE 1.60* 1.45* 1.50* 1.46* 1.03*  CALCIUM 9.4  --  8.3* 8.4* 8.4*  MG  --   --   --  1.9  --   PHOS  --   --   --  4.3  --    GFR: Estimated Creatinine Clearance: 42.5 mL/min (A) (by C-G formula based on SCr of 1.03 mg/dL (H)). Liver Function Tests: Recent Labs  Lab 09/16/19 0825  AST 48*  ALT 39  ALKPHOS 111  BILITOT 1.0  PROT 5.8*  ALBUMIN 3.1*   Recent Labs  Lab 09/16/19 0825  LIPASE 23   No results for input(s): AMMONIA in the last 168 hours. Coagulation Profile: Recent Labs  Lab 09/16/19 0859  INR 1.0   Cardiac Enzymes: No results for input(s): CKTOTAL, CKMB, CKMBINDEX, TROPONINI in the last 168 hours. BNP (last 3 results) No results for input(s): PROBNP in the last 8760 hours. HbA1C: No results for input(s): HGBA1C in the last 72 hours. CBG: Recent Labs  Lab 09/17/19 2140 09/18/19 0809 09/18/19 1205 09/18/19 1551 09/18/19 2117  GLUCAP 164* 116* 104* 167* 88   Lipid Profile: No results for input(s): CHOL, HDL, LDLCALC, TRIG, CHOLHDL, LDLDIRECT in the last 72 hours. Thyroid Function Tests: No results for input(s): TSH, T4TOTAL, FREET4, T3FREE, THYROIDAB in the last 72 hours. Anemia Panel: No results for input(s): VITAMINB12, FOLATE, FERRITIN, TIBC, IRON, RETICCTPCT in the last 72 hours. Sepsis Labs: Recent Labs  Lab 09/16/19 0859 09/16/19 1102 09/16/19 1201 09/16/19 1409  PROCALCITON  --   --  3.14  --   LATICACIDVEN THIS TEST WAS ORDERED IN ERROR AND HAS BEEN CREDITED. 3.5*  2.7* 2.0*    Recent Results (from the past 240 hour(s))  Blood Culture (routine x 2)     Status: None (Preliminary result)   Collection Time: 09/16/19  8:59 AM   Specimen: BLOOD  Result Value Ref Range Status   Specimen Description   Final    BLOOD BLOOD RIGHT FOREARM Performed at [facility] [address], [place], [place] [address]    Special Requests   Final    BOTTLES DRAWN AEROBIC AND ANAEROBIC Blood Culture adequate volume Performed at [facility] [address], [place], [place] [address]    Culture   Final    NO GROWTH 2 DAYS Performed at [facility] [address], [place], [place] [address]    Report Status PENDING  Incomplete  Blood Culture (routine x 2)  Status: None (Preliminary result)   Collection Time: 09/16/19  8:59 AM   Specimen: BLOOD  Result Value Ref Range Status   Specimen Description   Final    BLOOD RIGHT ANTECUBITAL Performed at [facility], [address], [place], [place] [address]    Special Requests   Final    BOTTLES DRAWN AEROBIC AND ANAEROBIC Blood Culture results may not be optimal due to an excessive volume of blood received in culture bottles Performed at [facility], [address] [place], [place] [address]    Culture   Final    NO GROWTH 2 DAYS Performed at [facility], [address], [place], [place] [address]    Report Status PENDING  Incomplete  Urine culture     Status: Abnormal (Preliminary result)   Collection Time: 09/16/19  8:59 AM   Specimen: In/Out Cath Urine  Result Value Ref Range Status   Specimen Description   Final    IN/OUT CATH URINE Performed at [facility], [address] [place], [place] [address]    Special Requests   Final    NONE Performed at [facility], [address] [place], [place] [address]    Culture (A)  Final    >=100,000 COLONIES/mL PROTEUS  MIRABILIS CULTURE REINCUBATED FOR BETTER GROWTH Performed at [facility], [address] [place], [place] [address]    Report Status PENDING  Incomplete   Organism ID, Bacteria PROTEUS MIRABILIS (A)  Final      Susceptibility   Proteus mirabilis - MIC*    AMPICILLIN <=2 SENSITIVE Sensitive     CEFAZOLIN <=4 SENSITIVE Sensitive     CEFTRIAXONE <=1 SENSITIVE Sensitive     CIPROFLOXACIN >=4 RESISTANT Resistant     GENTAMICIN <=1 SENSITIVE Sensitive     IMIPENEM 2 SENSITIVE Sensitive     NITROFURANTOIN 128 RESISTANT Resistant     TRIMETH/SULFA >=320 RESISTANT Resistant     AMPICILLIN/SULBACTAM <=2 SENSITIVE Sensitive     PIP/TAZO <=4 SENSITIVE Sensitive     * >=100,000 COLONIES/mL PROTEUS MIRABILIS  Respiratory Panel by RT PCR (Flu A&B, Covid) - Nasopharyngeal Swab     Status: None   Collection Time: 09/16/19  9:25 AM   Specimen: Nasopharyngeal Swab  Result Value Ref Range Status   SARS Coronavirus 2 by RT PCR NEGATIVE NEGATIVE Final    Comment: (NOTE) SARS-CoV-2 target nucleic acids are NOT DETECTED. The SARS-CoV-2 RNA is generally detectable in upper respiratoy specimens during the acute phase of infection. The lowest concentration of SARS-CoV-2 viral copies this assay can detect is 131 copies/mL. A negative result does not preclude SARS-Cov-2 infection and should not be used as the sole basis for treatment or other patient management decisions. A negative result may occur with  improper specimen collection/handling, submission of specimen other than nasopharyngeal swab, presence of viral mutation(s) within the areas targeted by this assay, and inadequate number of viral copies (<131 copies/mL). A negative result must be combined with clinical observations, patient history, and epidemiological information. The expected result is Negative. Fact Sheet for Patients:  https://www.moore.com/ Fact Sheet for Healthcare Providers:   https://www.young.biz/ This test is not yet ap proved or cleared by the [place] FDA and  has been authorized for detection and/or diagnosis of SARS-CoV-2 by FDA under an Emergency Use Authorization (EUA). This EUA will remain  in effect (meaning this test can be used) for the duration of the COVID-19 declaration under  Section 564(b)(1) of the Act, 21 U.S.C. section 360bbb-3(b)(1), unless the authorization is terminated or revoked sooner.    Influenza A by PCR NEGATIVE NEGATIVE Final   Influenza B by PCR NEGATIVE NEGATIVE Final    Comment: (NOTE) The Xpert Xpress SARS-CoV-2/FLU/RSV assay is intended as an aid in  the diagnosis of influenza from Nasopharyngeal swab specimens and  should not be used as a sole basis for treatment. Nasal washings and  aspirates are unacceptable for Xpert Xpress SARS-CoV-2/FLU/RSV  testing. Fact Sheet for Patients: https://www.moore.com/ Fact Sheet for Healthcare Providers: https://www.young.biz/ This test is not yet approved or cleared by the [place] FDA and  has been authorized for detection and/or diagnosis of SARS-CoV-2 by  FDA under an Emergency Use Authorization (EUA). This EUA will remain  in effect (meaning this test can be used) for the duration of the  Covid-19 declaration under Section 564(b)(1) of the Act, 21  U.S.C. section 360bbb-3(b)(1), unless the authorization is  terminated or revoked. Performed at [facility], [address], [place], [place] [address]   MRSA PCR Screening     Status: None   Collection Time: 09/16/19  6:59 PM   Specimen: Nasal Mucosa; Nasopharyngeal  Result Value Ref Range Status   MRSA by PCR NEGATIVE NEGATIVE Final    Comment:        The GeneXpert MRSA Assay (FDA approved for NASAL specimens only), is one component of a comprehensive MRSA colonization surveillance program. It is not intended to diagnose MRSA infection  nor to guide or monitor treatment for MRSA infections. Performed at [facility], [address] [place], [place] [address]          Radiology Studies: No results found.   Scheduled Meds: . amLODipine  5 mg Oral Daily  . Chlorhexidine Gluconate Cloth  6 each Topical Daily  . famotidine  20 mg Oral Daily  . insulin aspart  0-15 Units Subcutaneous TID WC  . insulin aspart  0-5 Units Subcutaneous QHS   Continuous Infusions: . cefTRIAXone (ROCEPHIN)  IV Stopped (09/18/19 2201)  . lactated ringers 100 mL/hr at 09/18/19 1528     LOS: 3 days    Time spent: Spent more than 30 minutes in coordinating care for this patient including bedside patient care.   Liborio Nixon, MD [facility] If 7PM-7AM, please contact night-coverage 09/19/2019, 7:47 AM

## 2019-09-20 LAB — URINE CULTURE: Culture: >=100000 — AB

## 2019-09-20 LAB — HEMOGLOBIN AND HEMATOCRIT, BLOOD
HCT: 28.9 % — ABNORMAL LOW (ref 36.0–46.0)
Hemoglobin: 9.1 g/dL — ABNORMAL LOW (ref 12.0–15.0)

## 2019-09-20 LAB — GLUCOSE, CAPILLARY
Glucose-Capillary: 85 mg/dL (ref 70–99)
Glucose-Capillary: 131 mg/dL — ABNORMAL HIGH (ref 70–99)
Glucose-Capillary: 129 mg/dL — ABNORMAL HIGH (ref 70–99)

## 2019-09-20 MED ORDER — HYDRALAZINE HCL 50 MG PO TABS
50.0000 mg | ORAL_TABLET | Freq: Three times a day (TID) | ORAL | Status: DC
  Administered 2019-09-20 – 2019-09-21 (×4): 50 mg via ORAL
  Filled 2019-09-20 (×4): qty 1

## 2019-09-20 NOTE — Evaluation (Signed)
Physical Therapy Evaluation Patient Details Name: Janice Little MRN: [id number] DOB: Dec 11, 1945 Today's Date: 09/20/2019   History of Present Illness  72 yo female admitted to ED on 12/14 from [facility] with UTI sepsis. 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    Clinical Impression  Pt presents with LE weakness L>R, R gaze preference, moderate LLE spasticity, total assist to perform bed mobility and sit EOB, impaired sitting balance, and decreased activity tolerance. Pt to benefit from acute PT to address deficits. Pt was total assist to get to and from EOB, tolerated sitting EOB ~5 minutes before fatiguing. Pt encouraged to perform ankle pumps bilaterally for circulation. PT recommending SNF level of care post-acutely, pt very limited by anxiety about falling. PT to progress mobility as tolerated, and will continue to follow acutely.      Follow Up Recommendations SNF    Equipment Recommendations  None recommended by PT    Recommendations for Other Services       Precautions / Restrictions Precautions Precautions: Fall Restrictions Weight Bearing Restrictions: No      Mobility  Bed Mobility Overal bed mobility: Needs Assistance Bed Mobility: Supine to Sit;Sit to Supine     Supine to sit: Total assist;HOB elevated Sit to supine: Total assist;HOB elevated   General bed mobility comments: total assist for supine<>sit for trunk elevation, LE management, scooting to and from EOB. Pt very limited by anxiety, pt reports no pain with mobility to EOB.  Transfers Overall transfer level: (NT)                  Ambulation/Gait                Stairs            Wheelchair Mobility    Modified Rankin (Stroke Patients Only)       Balance Overall balance assessment: Needs assistance;History of Falls Sitting-balance support: No upper extremity supported;Feet supported Sitting balance-Leahy Scale: Zero Sitting  balance - Comments: total assist to maintain sitting position Postural control: Posterior lean;Left lateral lean                                   Pertinent Vitals/Pain Pain Assessment: Faces Faces Pain Scale: Hurts even more Pain Location: generalized, with mobility of LEs Pain Descriptors / Indicators: Discomfort;Moaning;Grimacing;Guarding Pain Intervention(s): Limited activity within patient's tolerance;Monitored during session;Premedicated before session;Repositioned    Home Living Family/patient expects to be discharged to:: Skilled nursing facility                      Prior Function Level of Independence: Needs assistance   Gait / Transfers Assistance Needed: prior to admission, pt states she was receiving total care and did not leave the bed "for a very long time". PT asked if pt was receiving PT/OT at SNF, and she said "no".  ADL's / Homemaking Assistance Needed: total care        Hand Dominance   Dominant Hand: Right    Extremity/Trunk Assessment   Upper Extremity Assessment Upper Extremity Assessment: LUE deficits/detail LUE Deficits / Details: no active movement LUE this session, limited by pt pain LUE: Unable to fully assess due to pain    Lower Extremity Assessment Lower Extremity Assessment: LLE deficits/detail;RLE deficits/detail RLE Deficits / Details: partial AROM hip and knee flexion/extension, able to PF/DF, perform SLR during  donning/doffing prevalon boots LLE Deficits / Details: partial AROM hip and knee flexion/extension, limited by pain. Modified ashworth spasticity 2/5 knee extensors, knee flexors. None noted PF LLE: Unable to fully assess due to pain       Communication   Communication: No difficulties  Cognition Arousal/Alertness: Awake/alert Behavior During Therapy: Flat affect Overall Cognitive Status: No family/caregiver present to determine baseline cognitive functioning                                  General Comments: Pt A&O x4 this afternoon, unsure of accuracy of pt report regarding care and therapies she was was receiving at SNF [facility].      General Comments General comments (skin integrity, edema, etc.): Pt with R gaze preference    Exercises General Exercises - Lower Extremity Ankle Circles/Pumps: AROM;Both;5 reps;Supine   Assessment/Plan    PT Assessment Patient needs continued PT services  PT Problem List Decreased strength;Decreased mobility;Decreased range of motion;Decreased activity tolerance;Decreased balance;Decreased knowledge of use of DME;Pain;Decreased coordination;Impaired tone       PT Treatment Interventions DME instruction;Therapeutic activities;Gait training;Therapeutic exercise;Patient/family education;Balance training;Functional mobility training;Neuromuscular re-education    PT Goals (Current goals can be found in the Care Plan section)  Acute Rehab PT Goals Patient Stated Goal: none stated PT Goal Formulation: With patient Time For Goal Achievement: 10/04/19 Potential to Achieve Goals: Good    Frequency Min 2X/week   Barriers to discharge        Co-evaluation               AM-PAC PT "6 Clicks" Mobility  Outcome Measure Help needed turning from your back to your side while in a flat bed without using bedrails?: Total Help needed moving from lying on your back to sitting on the side of a flat bed without using bedrails?: Total Help needed moving to and from a bed to a chair (including a wheelchair)?: Total Help needed standing up from a chair using your arms (e.g., wheelchair or bedside chair)?: Total Help needed to walk in hospital room?: Total Help needed climbing 3-5 steps with a railing? : Total 6 Click Score: 6    End of Session   Activity Tolerance: Patient limited by pain;Patient limited by fatigue Patient left: in bed;with call bell/phone within reach;with bed alarm set;with SCD's reapplied;with nursing/sitter in room Nurse  Communication: Mobility status PT Visit Diagnosis: Muscle weakness (generalized) (M62.81);Other abnormalities of gait and mobility (R26.89);Hemiplegia and hemiparesis Hemiplegia - Right/Left: Left Hemiplegia - dominant/non-dominant: Non-dominant Hemiplegia - caused by: Cerebral infarction    Time: 3716-9678 PT Time Calculation (min) (ACUTE ONLY): 28 min   Charges:   PT Evaluation $PT Eval Low Complexity: 1 Low PT Treatments $Therapeutic Activity: 8-22 mins   Markey Deady E, PT Acute Rehabilitation Services Pager [phone]  Office [phone]   Athanasios Heldman D Despina Hidden 09/20/2019, 4:57 PM

## 2019-09-20 NOTE — Progress Notes (Signed)
PROGRESS NOTE    Janice Little  GQB:[id number] DOB: October 16, 1945 DOA: 09/16/2019 PCP: Juluis Rainier, MD    Brief Narrative:  73 year old nursing home resident with PMH of CKD stage II, type 2 diabetes, chronic pain, GERD, hypertension, multiple sclerosis, prior CVA admitted with abdominal pain, nausea, diarrhea   Assessment & Plan:   Active Problems:   Diabetes mellitus with diabetic neuropathy (HCC)   Essential hypertension   Acute sepsis (HCC)   Sepsis secondary to UTI (HCC)   Diarrhea   Gross hematuria  Sepsis secondary to UTI Questionable infectious diarrhea which has now resolved CT abdomen pelvis showed bilateral ureteral and right renal pelvic distention with perinephric stranding likely representing cystitis and pyelonephritis. No renal stones. Questionable neurogenic bladder per imaging. Lactic acidosis resolved Urine culture growing Proteus mirabilis. Blood cultures pending Continue ceftriaxone day 4/14   Gross hematuria Improving. Foley catheter in place with clearer urine today   Hemoglobin dropped from 13.8 on admission to 9 (though some of it is hemodilution with IV fluids) Hemodynamically stable Heparin for DVT prophylaxis discontinued (if no hematuria today, will restart anticoagulation tomorrow) Likely in setting of urinary infection Urology on board: appreciate assistance   Somnolence  She was more awake today. Suspect depressed mood is playing a significant role in somnolence.   AKI With history of CKD stage II Baseline creatinine appears to be 1.1 Creatinine elevated to 1.6 on admission Likely in setting of sepsis/prerenal Now resolved  Incidental pulmonary nodule On CT A/P.  Follow-up recommended at 6 to 12 months as outpatient.  Multiple sclerosis Not on any medications at home  Diabetes mellitus Hold Metformin, sitagliptin  Hypertension BP elevated.  Restart home amlodipine and hydralazine  Increased hydralazine dose today     DVT prophylaxis: SCD/Compression stockings (restart chemical anticoagulation tomorrow if no bleeding)  Code Status: Full code  Family Communication: Discussed care with patient, again attempted to call son with no response  Disposition Plan: Transfer out to floor. PT/OT consulted. Discharge to SNF tentatively in 1-2 days if remains medically stable with no bleeding   Consultants:   Patient initially seen by critical care, then a stepdown   Procedures:  None  Antimicrobials:   ceftriaxone   Subjective: No acute overnight events.  Patient seen and examined.  She is asleep but awakens on multiple verbal commands.  She gets tearful when asked what was bothering her. Says 'too many people are dying', 'we need the vaccine'. Says several people in her nursing home have passed. She did not know them personally but feels the loss.  When asked about her son, says she would not call him but would speak with him if he were to call her.  Denies any abdominal pain, nausea, dyspnea. Okay to work with PT.   Objective: Vitals:   09/20/19 0900 09/20/19 1100 09/20/19 1248 09/20/19 1501  BP: (!) 170/69  (!) 169/62   Pulse: (!) 103  (!) 102 (!) 106  Resp: 16  15 15   Temp:  98.2 F (36.8 C)    TempSrc:  Oral    SpO2: 99%  99% 98%  Weight:      Height:        Intake/Output Summary (Last 24 hours) at 09/20/2019 1502 Last data filed at 09/20/2019 1000 Gross per 24 hour  Intake 2406.63 ml  Output 2200 ml  Net 206.63 ml   Filed Weights   09/18/19 0500 09/19/19 0500 09/20/19 0500  Weight: 66.1 kg 63.2 kg 64.4 kg  Examination:  General exam: Appears calm and comfortable, dry oral mucosa, pale appearing Respiratory system: Clear to auscultation. Respiratory effort normal. Cardiovascular system: S1 & S2 heard, RRR. No JVD. No pedal edema. Gastrointestinal system: Abdomen is nondistended, soft, non tender. Normal bowel sounds heard. Central nervous system: Asleep, awakens on verbal  commands.  When awake, oriented to self and place.  Strength decreased in bilateral lower extremities likely due to poor effort and prolonged immobility Extremities: Slightly weaker on left upper extremity than right (per and this is baseline for years) Skin: No obvious rashes Psychiatry: Judgement and insight appear poor.  Flat affect   Data Reviewed: I have personally reviewed following labs and imaging studies  CBC: Recent Labs  Lab 09/16/19 0825 09/16/19 1201 09/17/19 0146 09/18/19 0203 09/18/19 1524 09/19/19 0811 09/20/19 0831  WBC 26.8* 23.8* 14.2* 11.9*  --   --   --   NEUTROABS 22.5*  --   --   --   --   --   --   HGB 13.8 11.9* 12.7 10.5* 9.0* 9.0* 9.1*  HCT 47.1* 38.6 40.2 33.9* 27.6* 28.0* 28.9*  MCV 99.2 94.8 95.0 95.2  --   --   --   PLT 348 325 268 231  --   --   --    Basic Metabolic Panel: Recent Labs  Lab 09/16/19 0825 09/16/19 1201 09/16/19 1409 09/17/19 0146 09/18/19 0203 09/19/19 0811  NA 136  --  142 140 139 138  K 5.6*  --  5.1 5.2* 4.1 3.9  CL 103  --  111 111 111 108  CO2 15*  --  20* 17* 17* 22  GLUCOSE 249*  --  167* 109* 140* 129*  BUN 75*  --  72* 72* 47* 21  CREATININE 1.60* 1.45* 1.50* 1.46* 1.03* 0.59  CALCIUM 9.4  --  8.3* 8.4* 8.4* 8.1*  MG  --   --   --  1.9  --   --   PHOS  --   --   --  4.3  --   --    GFR: Estimated Creatinine Clearance: 54.7 mL/min (by C-G formula based on SCr of 0.59 mg/dL). Liver Function Tests: Recent Labs  Lab 09/16/19 0825 09/19/19 0811  AST 48* 24  ALT 39 30  ALKPHOS 111 109  BILITOT 1.0 0.5  PROT 5.8* 4.7*  ALBUMIN 3.1* 2.4*   Recent Labs  Lab 09/16/19 0825  LIPASE 23   No results for input(s): AMMONIA in the last 168 hours. Coagulation Profile: Recent Labs  Lab 09/16/19 0859  INR 1.0   Cardiac Enzymes: No results for input(s): CKTOTAL, CKMB, CKMBINDEX, TROPONINI in the last 168 hours. BNP (last 3 results) No results for input(s): PROBNP in the last 8760 hours. HbA1C: No results  for input(s): HGBA1C in the last 72 hours. CBG: Recent Labs  Lab 09/19/19 1212 09/19/19 1605 09/19/19 2103 09/20/19 0746 09/20/19 1139  GLUCAP 97 116* 104* 85 131*   Lipid Profile: No results for input(s): CHOL, HDL, LDLCALC, TRIG, CHOLHDL, LDLDIRECT in the last 72 hours. Thyroid Function Tests: No results for input(s): TSH, T4TOTAL, FREET4, T3FREE, THYROIDAB in the last 72 hours. Anemia Panel: No results for input(s): VITAMINB12, FOLATE, FERRITIN, TIBC, IRON, RETICCTPCT in the last 72 hours. Sepsis Labs: Recent Labs  Lab 09/16/19 0859 09/16/19 1102 09/16/19 1201 09/16/19 1409  PROCALCITON  --   --  3.14  --   LATICACIDVEN THIS TEST WAS ORDERED IN ERROR AND HAS BEEN CREDITED.  3.5* 2.7* 2.0*    Recent Results (from the past 240 hour(s))  Blood Culture (routine x 2)     Status: None (Preliminary result)   Collection Time: 09/16/19  8:59 AM   Specimen: BLOOD  Result Value Ref Range Status   Specimen Description   Final    BLOOD BLOOD RIGHT FOREARM Performed at [facility] [address], [place], [place] [address]    Special Requests   Final    BOTTLES DRAWN AEROBIC AND ANAEROBIC Blood Culture adequate volume Performed at [facility] [address], [place], [place] [address]    Culture   Final    NO GROWTH 3 DAYS Performed at [facility], [place] [address] [place], [place] [address]    Report Status PENDING  Incomplete  Blood Culture (routine x 2)     Status: None (Preliminary result)   Collection Time: 09/16/19  8:59 AM   Specimen: BLOOD  Result Value Ref Range Status   Specimen Description   Final    BLOOD RIGHT ANTECUBITAL Performed at [facility] [address], [place], [place] [address]    Special Requests   Final    BOTTLES DRAWN AEROBIC AND ANAEROBIC Blood Culture results may not be optimal due to an excessive volume of blood received in culture bottles Performed at [facility] [address], [place], [place] [address]    Culture   Final    NO GROWTH 3 DAYS Performed at [facility], [place] [address] [place], [place] [address]    Report Status PENDING  Incomplete  Urine culture     Status: Abnormal   Collection Time: 09/16/19  8:59 AM   Specimen: In/Out Cath Urine  Result Value Ref Range Status   Specimen Description   Final    IN/OUT CATH URINE Performed at [facility] [address], [place], [place] [address]    Special Requests   Final    NONE Performed at [facility], [place] [address] [place], [place] [address]    Culture (A)  Final    >=100,000 COLONIES/mL PROTEUS MIRABILIS 50,000 COLONIES/mL KLEBSIELLA PNEUMONIAE    Report Status 09/20/2019 FINAL  Final   Organism ID, Bacteria PROTEUS MIRABILIS (A)  Final   Organism ID, Bacteria KLEBSIELLA PNEUMONIAE (A)  Final      Susceptibility   Klebsiella pneumoniae - MIC*    AMPICILLIN >=32 RESISTANT Resistant     CEFAZOLIN <=4 SENSITIVE Sensitive     CEFTRIAXONE <=1 SENSITIVE Sensitive     CIPROFLOXACIN <=0.25 SENSITIVE Sensitive     GENTAMICIN <=1 SENSITIVE Sensitive     IMIPENEM <=0.25 SENSITIVE Sensitive     NITROFURANTOIN 64 INTERMEDIATE Intermediate     TRIMETH/SULFA >=320 RESISTANT Resistant     AMPICILLIN/SULBACTAM 4 SENSITIVE Sensitive     PIP/TAZO <=4 SENSITIVE Sensitive     * 50,000 COLONIES/mL KLEBSIELLA PNEUMONIAE   Proteus mirabilis - MIC*    AMPICILLIN <=2 SENSITIVE Sensitive     CEFAZOLIN <=4 SENSITIVE Sensitive     CEFTRIAXONE <=1 SENSITIVE Sensitive     CIPROFLOXACIN >=4 RESISTANT Resistant     GENTAMICIN <=1 SENSITIVE Sensitive     IMIPENEM 2 SENSITIVE Sensitive     NITROFURANTOIN 128 RESISTANT Resistant     TRIMETH/SULFA >=320 RESISTANT Resistant     AMPICILLIN/SULBACTAM <=2 SENSITIVE Sensitive     PIP/TAZO <=4 SENSITIVE Sensitive     * >=100,000 COLONIES/mL  PROTEUS MIRABILIS  Respiratory Panel by RT PCR (Flu A&B,  Covid) - Nasopharyngeal Swab     Status: None   Collection Time: 09/16/19  9:25 AM   Specimen: Nasopharyngeal Swab  Result Value Ref Range Status   SARS Coronavirus 2 by RT PCR NEGATIVE NEGATIVE Final    Comment: (NOTE) SARS-CoV-2 target nucleic acids are NOT DETECTED. The SARS-CoV-2 RNA is generally detectable in upper respiratoy specimens during the acute phase of infection. The lowest concentration of SARS-CoV-2 viral copies this assay can detect is 131 copies/mL. A negative result does not preclude SARS-Cov-2 infection and should not be used as the sole basis for treatment or other patient management decisions. A negative result may occur with  improper specimen collection/handling, submission of specimen other than nasopharyngeal swab, presence of viral mutation(s) within the areas targeted by this assay, and inadequate number of viral copies (<131 copies/mL). A negative result must be combined with clinical observations, patient history, and epidemiological information. The expected result is Negative. Fact Sheet for Patients:  https://www.moore.com/https://www.fda.gov/media/142436/download Fact Sheet for Healthcare Providers:  https://www.young.biz/https://www.fda.gov/media/142435/download This test is not yet ap proved or cleared by the [place] FDA and  has been authorized for detection and/or diagnosis of SARS-CoV-2 by FDA under an Emergency Use Authorization (EUA). This EUA will remain  in effect (meaning this test can be used) for the duration of the COVID-19 declaration under Section 564(b)(1) of the Act, 21 U.S.C. section 360bbb-3(b)(1), unless the authorization is terminated or revoked sooner.    Influenza A by PCR NEGATIVE NEGATIVE Final   Influenza B by PCR NEGATIVE NEGATIVE Final    Comment: (NOTE) The Xpert Xpress SARS-CoV-2/FLU/RSV assay is intended as an aid in  the diagnosis of influenza from Nasopharyngeal swab specimens and  should not be used as a sole basis for treatment. Nasal washings and    aspirates are unacceptable for Xpert Xpress SARS-CoV-2/FLU/RSV  testing. Fact Sheet for Patients: https://www.moore.com/https://www.fda.gov/media/142436/download Fact Sheet for Healthcare Providers: https://www.young.biz/https://www.fda.gov/media/142435/download This test is not yet approved or cleared by the [place] FDA and  has been authorized for detection and/or diagnosis of SARS-CoV-2 by  FDA under an Emergency Use Authorization (EUA). This EUA will remain  in effect (meaning this test can be used) for the duration of the  Covid-19 declaration under Section 564(b)(1) of the Act, 21  U.S.C. section 360bbb-3(b)(1), unless the authorization is  terminated or revoked. Performed at [facility], [address] [place], [place] [address]   MRSA PCR Screening     Status: None   Collection Time: 09/16/19  6:59 PM   Specimen: Nasal Mucosa; Nasopharyngeal  Result Value Ref Range Status   MRSA by PCR NEGATIVE NEGATIVE Final    Comment:        The GeneXpert MRSA Assay (FDA approved for NASAL specimens only), is one component of a comprehensive MRSA colonization surveillance program. It is not intended to diagnose MRSA infection nor to guide or monitor treatment for MRSA infections. Performed at [facility], [address] [place], [place] [address]          Radiology Studies: CUP PACEART REMOTE DEVICE CHECK  Result Date: 09/19/2019 [facility] summary report received. Battery status OK. Normal device function. No new symptom episodes, tachy episodes, brady, or pause episodes. No new AF episodes. Monthly summary reports and ROV/PRN Hassell HalimMary Jo Lemke, RN, CCDS, [employer]    Scheduled Meds: . amLODipine  5 mg Oral Daily  . Chlorhexidine Gluconate Cloth  6  each Topical Daily  . famotidine  20 mg Oral Daily  . hydrALAZINE  50 mg Oral Q8H  . insulin aspart  0-15 Units Subcutaneous TID WC  . insulin aspart  0-5 Units Subcutaneous QHS   Continuous Infusions: .  cefTRIAXone (ROCEPHIN)  IV Stopped (09/19/19 2201)  . lactated ringers 100 mL/hr at 09/20/19 1245     LOS: 4 days    Time spent: Spent more than 30 minutes in coordinating care for this patient including bedside patient care.   Liborio NixonJanki Y Harish Bram, MD [facility] If 7PM-7AM, please contact night-coverage 09/20/2019, 3:02 PM

## 2019-09-21 LAB — SARS CORONAVIRUS 2 (TAT 6-24 HRS): SARS Coronavirus 2: NEGATIVE

## 2019-09-21 LAB — CULTURE, BLOOD (ROUTINE X 2)
Culture: NO GROWTH
Culture: NO GROWTH
Special Requests: ADEQUATE

## 2019-09-21 LAB — GLUCOSE, CAPILLARY
Glucose-Capillary: 110 mg/dL — ABNORMAL HIGH (ref 70–99)
Glucose-Capillary: 103 mg/dL — ABNORMAL HIGH (ref 70–99)
Glucose-Capillary: 124 mg/dL — ABNORMAL HIGH (ref 70–99)

## 2019-09-21 LAB — HEMOGLOBIN AND HEMATOCRIT, BLOOD
HCT: 31.1 % — ABNORMAL LOW (ref 36.0–46.0)
Hemoglobin: 9.7 g/dL — ABNORMAL LOW (ref 12.0–15.0)

## 2019-09-21 MED ORDER — ENOXAPARIN SODIUM 40 MG/0.4ML ~~LOC~~ SOLN
40.0000 mg | SUBCUTANEOUS | Status: DC
  Administered 2019-09-21 – 2019-09-22 (×2): 40 mg via SUBCUTANEOUS
  Filled 2019-09-21 (×4): qty 0.4

## 2019-09-21 MED ORDER — HYDRALAZINE HCL 50 MG PO TABS
75.0000 mg | ORAL_TABLET | Freq: Three times a day (TID) | ORAL | Status: DC
  Administered 2019-09-21 – 2019-09-24 (×10): 75 mg via ORAL
  Filled 2019-09-21 (×10): qty 1

## 2019-09-21 MED ORDER — LISINOPRIL 20 MG PO TABS
20.0000 mg | ORAL_TABLET | Freq: Every day | ORAL | Status: DC
  Administered 2019-09-22 – 2019-09-24 (×3): 20 mg via ORAL
  Filled 2019-09-21 (×3): qty 1

## 2019-09-21 NOTE — Progress Notes (Signed)
Occupational Therapy Evaluation Patient Details Name: Janice Little MRN: [id number] DOB: May 29, 1946 Today's Date: 09/21/2019    History of Present Illness 73 yo female admitted to ED on 12/14 from [facility] with UTI sepsis. 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   Clinical Impression   Pt from [facility], she reports she has been requiring totalA for all ADL and has been primarily bed bound. Pt is unclear if she has been working with OT/PT while at [employer] [facility] [employer]. Pt currently demonstrates R gaze preference, but will attend to L side of environment with prompting. Pt currently requires maxA for rolling in bed. She was limited to bed level session this date secondary to pain level. Pt requires maxA for feeding and grooming due to upper extremity limitations. Recommend d.c. to SNF with continued OT services. Pt will continue to benefit from skilled OT services to maximize safety and independence with ADL/IADL and functional mobility. Will continue to follow acutely and progress as tolerated.       Follow Up Recommendations  SNF;Supervision/Assistance - 24 hour    Equipment Recommendations  3 in 1 bedside commode;Hospital bed    Recommendations for Other Services       Precautions / Restrictions Precautions Precautions: Fall Restrictions Weight Bearing Restrictions: No      Mobility Bed Mobility Overal bed mobility: Needs Assistance Bed Mobility: Rolling Rolling: Max assist         General bed mobility comments: maxA to roll R<>L;pt with increased pain with rolling;limited to bed level  Transfers                 General transfer comment: deferred    Balance       Sitting balance - Comments: deferred                                   ADL either performed or assessed with clinical judgement   ADL Overall ADL's : Needs assistance/impaired Eating/Feeding: Maximal assistance;Sitting   Grooming: Maximal  assistance;Sitting   Upper Body Bathing: Maximal assistance   Lower Body Bathing: Total assistance   Upper Body Dressing : Maximal assistance   Lower Body Dressing: Total assistance   Toilet Transfer: Total assistance   Toileting- Clothing Manipulation and Hygiene: Total assistance       Functional mobility during ADLs: Maximal assistance;Total assistance General ADL Comments: pt limited to bed level session this date for pt and therapist safety;pt significantly limited to pain, agreeable to bed level exercises;pt requires maxA-totalA for all ADL due to decreased use of BUE     Vision Baseline Vision/History: Wears glasses Patient Visual Report: No change from baseline Vision Assessment?: Yes Eye Alignment: Within Functional Limits Ocular Range of Motion: Restricted on the left Alignment/Gaze Preference: Head turned;Gaze right Tracking/Visual Pursuits: Unable to hold eye position out of midline;Requires cues, head turns, or add eye shifts to track Additional Comments: decreased visual attention, continue to assess;pt demonstrates right gaze preference, able to gaze left when prompted     Perception     Praxis      Pertinent Vitals/Pain Pain Assessment: 0-10 Pain Score: 10-Worst pain ever Pain Location: generalized, with mobility of LEs Pain Descriptors / Indicators: Discomfort;Moaning;Grimacing;Guarding Pain Intervention(s): Limited activity within patient's tolerance;Monitored during session;Repositioned     Hand Dominance Right   Extremity/Trunk Assessment Upper Extremity Assessment Upper Extremity Assessment: LUE deficits/detail;RUE deficits/detail RUE Deficits / Details:  limited shoulder ROM due to pain;decreased fine motor strength and coordination;demonstrated difficulty pressing buttons on remote control, demonstrates difficulty maintaining grasp on objects  RUE: Unable to fully assess due to pain RUE Sensation: WNL RUE Coordination: decreased gross  motor;WNL LUE Deficits / Details: no active movement LUE this session, limited by pt pain;noted increased edema in hand, able to wiggle fingers, unable to make fist LUE: Unable to fully assess due to pain LUE Sensation: WNL LUE Coordination: decreased gross motor;decreased fine motor   Lower Extremity Assessment Lower Extremity Assessment: Defer to PT evaluation       Communication Communication Communication: Expressive difficulties   Cognition Arousal/Alertness: Awake/alert Behavior During Therapy: Flat affect Overall Cognitive Status: No family/caregiver present to determine baseline cognitive functioning                                 General Comments: Pt A&O x4 this afternoon, unsure of accuracy of pt report regarding care and therapies she was was receiving at SNF, pt was motivated to work with therapy this session   General Comments  educated pt on importance of elevating LUE and importance of movement of LUE    Exercises     Shoulder Instructions      Home Living Family/patient expects to be discharged to:: Skilled nursing facility                                        Prior Functioning/Environment Level of Independence: Needs assistance  Gait / Transfers Assistance Needed: prior to admission, pt states she was receiving total care and did not leave the bed "for a very long time". unclear if pt was receiving OT/PT ADL's / Homemaking Assistance Needed: total care   Comments: pt reports she has "not been out of bed in a long time"..."they don't let me out of bed because they think I am going to fall"        OT Problem List: Decreased strength;Decreased range of motion;Decreased activity tolerance;Impaired vision/perception;Decreased cognition;Decreased safety awareness;Decreased knowledge of precautions;Impaired tone;Impaired UE functional use;Pain      OT Treatment/Interventions: Self-care/ADL training;Therapeutic  exercise;Neuromuscular education;DME and/or AE instruction;Therapeutic activities;Visual/perceptual remediation/compensation;Patient/family education;Balance training    OT Goals(Current goals can be found in the care plan section) Acute Rehab OT Goals Patient Stated Goal: to not be in pain OT Goal Formulation: With patient Time For Goal Achievement: 10/05/19 Potential to Achieve Goals: Good ADL Goals Pt Will Perform Eating: with set-up;with adaptive utensils Pt Will Perform Grooming: with set-up;with adaptive equipment Pt Will Transfer to Toilet: with mod assist;stand pivot transfer Additional ADL Goal #1: Pt will progress to EOB with modA in preparation for ADL. Additional ADL Goal #2: Pt will independently attend to left side of environment throughout ADL.  OT Frequency: Min 2X/week   Barriers to D/C: Decreased caregiver support          Co-evaluation              AM-PAC OT "6 Clicks" Daily Activity     Outcome Measure Help from another person eating meals?: A Lot Help from another person taking care of personal grooming?: A Lot Help from another person toileting, which includes using toliet, bedpan, or urinal?: Total Help from another person bathing (including washing, rinsing, drying)?: Total Help from another person to put on and taking off regular  upper body clothing?: Total Help from another person to put on and taking off regular lower body clothing?: Total 6 Click Score: 8   End of Session Nurse Communication: Mobility status(bed alarm malfunction)  Activity Tolerance: Patient limited by pain Patient left: in bed;with call bell/phone within reach(bed alarm broken, RN notified)  OT Visit Diagnosis: Unsteadiness on feet (R26.81);Other abnormalities of gait and mobility (R26.89);Muscle weakness (generalized) (M62.81);Low vision, both eyes (H54.2);Feeding difficulties (R63.3);Other symptoms and signs involving cognitive function;Pain Pain - Right/Left:  (generalized) Pain - part of body: (generalized with movement)                Time: 1694-5038 OT Time Calculation (min): 24 min Charges:  OT General Charges $OT Visit: 1 Visit OT Evaluation $OT Eval Moderate Complexity: 1 Mod OT Treatments $Self Care/Home Management : 8-22 mins  Diona Browner OTR/L Acute Rehabilitation Services Office: [phone]   Rebeca Alert 09/21/2019, 11:37 AM

## 2019-09-21 NOTE — Progress Notes (Addendum)
PROGRESS NOTE    Janice Little  BDZ:[id number] DOB: Oct 24, 1945 DOA: 09/16/2019 PCP: Juluis Rainier, MD    Brief Narrative:  73 year old nursing home resident with PMH of CKD stage II, type 2 diabetes, chronic pain, GERD, hypertension, multiple sclerosis, prior CVA admitted with abdominal pain, nausea, diarrhea   Assessment & Plan:   Active Problems:   Diabetes mellitus with diabetic neuropathy (HCC)   Essential hypertension   Acute sepsis (HCC)   Sepsis secondary to UTI (HCC)   Diarrhea   Gross hematuria  Sepsis secondary to UTI Questionable infectious diarrhea which has now resolved CT abdomen pelvis showed bilateral ureteral and right renal pelvic distention with perinephric stranding likely representing cystitis and pyelonephritis. No renal stones. Questionable neurogenic bladder per imaging. Lactic acidosis resolved Urine culture growing Proteus mirabilis. Blood cultures pending Continue ceftriaxone day 5/14   Gross hematuria Resolved Hemoglobin dropped from 13.8 on admission to ~9 (though some of it is hemodilution with IV fluids) Hemodynamically stable Likely in setting of urinary infection Will restart chemical DVT prophylaxis Urology on board: appreciate assistance   Somnolence  Resolved. She is awake and alert today. Suspect depressed mood is playing a significant role in somnolence.   AKI With history of CKD stage II Baseline creatinine appears to be 1.1 Creatinine elevated to 1.6 on admission Likely in setting of sepsis/prerenal Now resolved  Incidental pulmonary nodule On CT A/P.  Follow-up recommended at 6 to 12 months as outpatient.  Multiple sclerosis Not on any medications at home  Diabetes mellitus Hold Metformin, sitagliptin  Hypertension BP elevated.  Restart home amlodipine and hydralazine  Increased hydralazine dose to 75 mg TID, add lisinopril 20 mg qd today    DVT prophylaxis: SCD/Compression stockings (restart chemical  anticoagulation tomorrow if no bleeding)  Code Status: Full code  Family Communication: Discussed care with patient, again attempted to call son with no response  Disposition Plan: PT/OT recommends SNF, covid screening test ordered. Plan for discharge tomorrow, if SNF bed available at [facility] (per case management, family did not hold her bed but she is a long term care resident)  Consultants:   Patient initially seen by critical care, then a stepdown   Procedures:  None  Antimicrobials:   ceftriaxone   Subjective: No acute overnight events.  Patient seen and examined.  Reports doing better. Denies abdominal pain, nausea, diarrhea. She is okay going back to the nursing home.   Objective: Vitals:   09/20/19 2201 09/21/19 0200 09/21/19 0533 09/21/19 1243  BP: (!) 173/71 (!) 163/74 (!) 168/71 (!) 166/69  Pulse: 93  95 (!) 101  Resp: 16  18 14   Temp: 97.8 F (36.6 C)  98 F (36.7 C) 98.4 F (36.9 C)  TempSrc: Oral  Oral Oral  SpO2: 97%   99%  Weight:   64.2 kg   Height:        Intake/Output Summary (Last 24 hours) at 09/21/2019 1818 Last data filed at 09/21/2019 1400 Gross per 24 hour  Intake 2625.87 ml  Output 2400 ml  Net 225.87 ml   Filed Weights   09/19/19 0500 09/20/19 0500 09/21/19 0533  Weight: 63.2 kg 64.4 kg 64.2 kg    Examination:  General exam: Appears calm and comfortable, dry oral mucosa, pale appearing Respiratory system: Clear to auscultation. Respiratory effort normal. Cardiovascular system: S1 & S2 heard, RRR. No JVD. No pedal edema. Gastrointestinal system: Abdomen is nondistended, soft, non tender. Normal bowel sounds heard. Central nervous system:Awake, alert- oriented to self  and place.  Strength decreased in bilateral lower extremities likely due to poor effort and prolonged immobility Extremities: Slightly weaker on left upper extremity than right (per and this is baseline for years) Skin: No obvious rashes Psychiatry: Judgement and  insight appear poor.  Flat affect   Data Reviewed: I have personally reviewed following labs and imaging studies  CBC: Recent Labs  Lab 09/16/19 0825 09/16/19 1201 09/17/19 0146 09/18/19 0203 09/18/19 1524 09/19/19 0811 09/20/19 0831 09/21/19 0445  WBC 26.8* 23.8* 14.2* 11.9*  --   --   --   --   NEUTROABS 22.5*  --   --   --   --   --   --   --   HGB 13.8 11.9* 12.7 10.5* 9.0* 9.0* 9.1* 9.7*  HCT 47.1* 38.6 40.2 33.9* 27.6* 28.0* 28.9* 31.1*  MCV 99.2 94.8 95.0 95.2  --   --   --   --   PLT 348 325 268 231  --   --   --   --    Basic Metabolic Panel: Recent Labs  Lab 09/16/19 0825 09/16/19 1201 09/16/19 1409 09/17/19 0146 09/18/19 0203 09/19/19 0811  NA 136  --  142 140 139 138  K 5.6*  --  5.1 5.2* 4.1 3.9  CL 103  --  111 111 111 108  CO2 15*  --  20* 17* 17* 22  GLUCOSE 249*  --  167* 109* 140* 129*  BUN 75*  --  72* 72* 47* 21  CREATININE 1.60* 1.45* 1.50* 1.46* 1.03* 0.59  CALCIUM 9.4  --  8.3* 8.4* 8.4* 8.1*  MG  --   --   --  1.9  --   --   PHOS  --   --   --  4.3  --   --    GFR: Estimated Creatinine Clearance: 54.7 mL/min (by C-G formula based on SCr of 0.59 mg/dL). Liver Function Tests: Recent Labs  Lab 09/16/19 0825 09/19/19 0811  AST 48* 24  ALT 39 30  ALKPHOS 111 109  BILITOT 1.0 0.5  PROT 5.8* 4.7*  ALBUMIN 3.1* 2.4*   Recent Labs  Lab 09/16/19 0825  LIPASE 23   No results for input(s): AMMONIA in the last 168 hours. Coagulation Profile: Recent Labs  Lab 09/16/19 0859  INR 1.0   Cardiac Enzymes: No results for input(s): CKTOTAL, CKMB, CKMBINDEX, TROPONINI in the last 168 hours. BNP (last 3 results) No results for input(s): PROBNP in the last 8760 hours. HbA1C: No results for input(s): HGBA1C in the last 72 hours. CBG: Recent Labs  Lab 09/20/19 0746 09/20/19 1139 09/20/19 1729 09/20/19 2245 09/21/19 0726  GLUCAP 85 131* 129* 110* 103*   Lipid Profile: No results for input(s): CHOL, HDL, LDLCALC, TRIG, CHOLHDL,  LDLDIRECT in the last 72 hours. Thyroid Function Tests: No results for input(s): TSH, T4TOTAL, FREET4, T3FREE, THYROIDAB in the last 72 hours. Anemia Panel: No results for input(s): VITAMINB12, FOLATE, FERRITIN, TIBC, IRON, RETICCTPCT in the last 72 hours. Sepsis Labs: Recent Labs  Lab 09/16/19 0859 09/16/19 1102 09/16/19 1201 09/16/19 1409  PROCALCITON  --   --  3.14  --   LATICACIDVEN THIS TEST WAS ORDERED IN ERROR AND HAS BEEN CREDITED. 3.5* 2.7* 2.0*    Recent Results (from the past 240 hour(s))  Blood Culture (routine x 2)     Status: None   Collection Time: 09/16/19  8:59 AM   Specimen: BLOOD  Result Value Ref Range Status  Specimen Description   Final    BLOOD BLOOD RIGHT FOREARM Performed at [facility], [address] [place], [place] [address]    Special Requests   Final    BOTTLES DRAWN AEROBIC AND ANAEROBIC Blood Culture adequate volume Performed at [facility], [address], [place], [place] [address]    Culture   Final    NO GROWTH 5 DAYS Performed at [facility], [address] [place], [place] [address]    Report Status 09/21/2019 FINAL  Final  Blood Culture (routine x 2)     Status: None   Collection Time: 09/16/19  8:59 AM   Specimen: BLOOD  Result Value Ref Range Status   Specimen Description   Final    BLOOD RIGHT ANTECUBITAL Performed at [facility], [address] [place], [place] [address]    Special Requests   Final    BOTTLES DRAWN AEROBIC AND ANAEROBIC Blood Culture results may not be optimal due to an excessive volume of blood received in culture bottles Performed at [facility], [address] [place], [place] [address]    Culture   Final    NO GROWTH 5 DAYS Performed at [facility], [address], [place], [place] [address]    Report Status 09/21/2019 FINAL  Final  Urine culture     Status: Abnormal   Collection Time: 09/16/19  8:59 AM     Specimen: In/Out Cath Urine  Result Value Ref Range Status   Specimen Description   Final    IN/OUT CATH URINE Performed at [facility], [address] [place], [place] [address]    Special Requests   Final    NONE Performed at [facility], [address], [place], [place] [address]    Culture (A)  Final    >=100,000 COLONIES/mL PROTEUS MIRABILIS 50,000 COLONIES/mL KLEBSIELLA PNEUMONIAE    Report Status 09/20/2019 FINAL  Final   Organism ID, Bacteria PROTEUS MIRABILIS (A)  Final   Organism ID, Bacteria KLEBSIELLA PNEUMONIAE (A)  Final      Susceptibility   Klebsiella pneumoniae - MIC*    AMPICILLIN >=32 RESISTANT Resistant     CEFAZOLIN <=4 SENSITIVE Sensitive     CEFTRIAXONE <=1 SENSITIVE Sensitive     CIPROFLOXACIN <=0.25 SENSITIVE Sensitive     GENTAMICIN <=1 SENSITIVE Sensitive     IMIPENEM <=0.25 SENSITIVE Sensitive     NITROFURANTOIN 64 INTERMEDIATE Intermediate     TRIMETH/SULFA >=320 RESISTANT Resistant     AMPICILLIN/SULBACTAM 4 SENSITIVE Sensitive     PIP/TAZO <=4 SENSITIVE Sensitive     * 50,000 COLONIES/mL KLEBSIELLA PNEUMONIAE   Proteus mirabilis - MIC*    AMPICILLIN <=2 SENSITIVE Sensitive     CEFAZOLIN <=4 SENSITIVE Sensitive     CEFTRIAXONE <=1 SENSITIVE Sensitive     CIPROFLOXACIN >=4 RESISTANT Resistant     GENTAMICIN <=1 SENSITIVE Sensitive     IMIPENEM 2 SENSITIVE Sensitive     NITROFURANTOIN 128 RESISTANT Resistant     TRIMETH/SULFA >=320 RESISTANT Resistant     AMPICILLIN/SULBACTAM <=2 SENSITIVE Sensitive     PIP/TAZO <=4 SENSITIVE Sensitive     * >=100,000 COLONIES/mL PROTEUS MIRABILIS  Respiratory Panel by RT PCR (Flu A&B, Covid) - Nasopharyngeal Swab     Status: None   Collection Time: 09/16/19  9:25 AM   Specimen: Nasopharyngeal Swab  Result Value Ref Range Status   SARS Coronavirus 2 by RT  PCR NEGATIVE NEGATIVE Final    Comment: (NOTE) SARS-CoV-2 target nucleic acids are NOT DETECTED. The  SARS-CoV-2 RNA is generally detectable in upper respiratoy specimens during the acute phase of infection. The lowest concentration of SARS-CoV-2 viral copies this assay can detect is 131 copies/mL. A negative result does not preclude SARS-Cov-2 infection and should not be used as the sole basis for treatment or other patient management decisions. A negative result may occur with  improper specimen collection/handling, submission of specimen other than nasopharyngeal swab, presence of viral mutation(s) within the areas targeted by this assay, and inadequate number of viral copies (<131 copies/mL). A negative result must be combined with clinical observations, patient history, and epidemiological information. The expected result is Negative. Fact Sheet for Patients:  https://www.moore.com/https://www.fda.gov/media/142436/download Fact Sheet for Healthcare Providers:  https://www.young.biz/https://www.fda.gov/media/142435/download This test is not yet ap proved or cleared by the [place] FDA and  has been authorized for detection and/or diagnosis of SARS-CoV-2 by FDA under an Emergency Use Authorization (EUA). This EUA will remain  in effect (meaning this test can be used) for the duration of the COVID-19 declaration under Section 564(b)(1) of the Act, 21 U.S.C. section 360bbb-3(b)(1), unless the authorization is terminated or revoked sooner.    Influenza A by PCR NEGATIVE NEGATIVE Final   Influenza B by PCR NEGATIVE NEGATIVE Final    Comment: (NOTE) The Xpert Xpress SARS-CoV-2/FLU/RSV assay is intended as an aid in  the diagnosis of influenza from Nasopharyngeal swab specimens and  should not be used as a sole basis for treatment. Nasal washings and  aspirates are unacceptable for Xpert Xpress SARS-CoV-2/FLU/RSV  testing. Fact Sheet for Patients: https://www.moore.com/https://www.fda.gov/media/142436/download Fact Sheet for Healthcare Providers: https://www.young.biz/https://www.fda.gov/media/142435/download This test is not yet approved or cleared by the [place] FDA and  has been authorized for detection and/or diagnosis of SARS-CoV-2 by  FDA under an Emergency Use Authorization (EUA). This EUA will remain  in effect (meaning this test can be used) for the duration of the  Covid-19 declaration under Section 564(b)(1) of the Act, 21  U.S.C. section 360bbb-3(b)(1), unless the authorization is  terminated or revoked. Performed at [facility], [address] [place], [place] [address]   MRSA PCR Screening     Status: None   Collection Time: 09/16/19  6:59 PM   Specimen: Nasal Mucosa; Nasopharyngeal  Result Value Ref Range Status   MRSA by PCR NEGATIVE NEGATIVE Final    Comment:        The GeneXpert MRSA Assay (FDA approved for NASAL specimens only), is one component of a comprehensive MRSA colonization surveillance program. It is not intended to diagnose MRSA infection nor to guide or monitor treatment for MRSA infections. Performed at [facility], [address] [place], [place] [address]       Radiology Studies: No results found.   Scheduled Meds: . amLODipine  5 mg Oral Daily  . enoxaparin (LOVENOX) injection  40 mg Subcutaneous Q24H  . famotidine  20 mg Oral Daily  . hydrALAZINE  50 mg Oral Q8H   Continuous Infusions: . cefTRIAXone (ROCEPHIN)  IV 1 g (09/20/19 2156)  . lactated ringers 100 mL/hr at 09/20/19 2227     LOS: 5 days    Time spent: Spent more than 30 minutes in coordinating care for this patient including bedside patient care.   Liborio NixonJanki Y Reggie Bise, MD [facility] If 7PM-7AM, please contact night-coverage 09/21/2019, 6:18 PM

## 2019-09-22 DIAGNOSIS — E1140 Type 2 diabetes mellitus with diabetic neuropathy, unspecified: Secondary | ICD-10-CM

## 2019-09-22 LAB — GLUCOSE, CAPILLARY
Glucose-Capillary: 117 mg/dL — ABNORMAL HIGH (ref 70–99)
Glucose-Capillary: 169 mg/dL — ABNORMAL HIGH (ref 70–99)
Glucose-Capillary: 142 mg/dL — ABNORMAL HIGH (ref 70–99)

## 2019-09-22 LAB — HEMOGLOBIN AND HEMATOCRIT, BLOOD
HCT: 30.8 % — ABNORMAL LOW (ref 36.0–46.0)
Hemoglobin: 9.6 g/dL — ABNORMAL LOW (ref 12.0–15.0)

## 2019-09-22 NOTE — Progress Notes (Signed)
PROGRESS NOTE    Janice NimsCarol T Little  YQM:[id number] DOB: 10/15/1945 DOA: 09/16/2019 PCP: Juluis RainierBarnes, Georgi Tuel, MD   Brief Narrative: 73 year old nursing home resident with PMH of CKD stage II, type 2 diabetes, chronic pain, GERD, hypertension, multiple sclerosis, prior CVA admitted with abdominal pain, nausea, diarrhea  Assessment & Plan:   Active Problems:   Diabetes mellitus with diabetic neuropathy (HCC)   Essential hypertension   Acute sepsis (HCC)   Sepsis secondary to UTI (HCC)   Diarrhea   Gross hematuria   #1 sepsis secondary to UTI - Blood culture no growth so far. CT of the abdomen and pelvis shows findings consistent with cystitis and pyelonephritis. Urine culture growing Proteus mirabilis and Klebsiella pneumonia.  Both are sensitive to Rocephin.  #2 status post gross hematuria resolved hemoglobin dropped from 13.8 to 9 part of which was due to hemodilution secondary to IV hydration.  Likely secondary to UTI.  Urology following.  #3 AKI on CKD stage II baseline creatinine 1.1 this is resolved with IV fluids.  Creatinine stable at 0.59.  #4 MS not on any meds as an outpatient  #5 type 2 diabetes was on Metformin and sitagliptin prior to admission.  Blood sugar stable between 10 3-1 24.  #6 incidental pulmonary nodule needs outpatient follow-up.  #7 hypertension Home medications have been restarted already on amlodipine and hydralazine.  Blood pressure remained elevated so lisinopril was added.  Continue all for now.  Blood pressure today 167/81.  Pressure Injury 02/20/19 Other (Comment) Upper Stage II -  Partial thickness loss of dermis presenting as a shallow open ulcer with a red, pink wound bed without slough. (Active)  02/20/19 2030  Location: Other (Comment)  Location Orientation: Upper  Staging: Stage II -  Partial thickness loss of dermis presenting as a shallow open ulcer with a red, pink wound bed without slough.  Wound Description (Comments):   Present on  Admission: No     Pressure Injury 09/16/19 Sacrum Posterior Stage I -  Intact skin with non-blanchable redness of a localized area usually over a bony prominence. (Active)  09/16/19 1830  Location: Sacrum  Location Orientation: Posterior  Staging: Stage I -  Intact skin with non-blanchable redness of a localized area usually over a bony prominence.  Wound Description (Comments):   Present on Admission:      Pressure Injury 09/16/19 Heel Right;Left Stage I -  Intact skin with non-blanchable redness of a localized area usually over a bony prominence. (Active)  09/16/19 1830  Location: Heel  Location Orientation: Right;Left  Staging: Stage I -  Intact skin with non-blanchable redness of a localized area usually over a bony prominence.  Wound Description (Comments):   Present on Admission: Yes    Estimated body mass index is 23.55 kg/m as calculated from the following:   Height as of this encounter: 5\' 5"  (1.651 m).   Weight as of this encounter: 64.2 kg.  DVT prophylaxis:  Lovenox Code Status: Full code  Family Communication: Attempted to call family no response Disposition Plan: Patient is a long-term care resident [facility] however apparently they have not held her bed.  TOC consult Consultants:   Patient initially seen by critical care  Procedures:  None  Antimicrobials:   ceftriaxone  Subjective: She is resting in bed very sleepy opens eyes makes some eye contact and have some conversations and goes back to sleep  Objective: Vitals:   09/21/19 1900 09/21/19 2049  09/22/19 0522 09/22/19 0523  BP: (!) 156/70 (!) 157/74 (!) 167/81 (!) 167/81  Pulse: (!) 103 99  93  Resp: 13 14  14   Temp: 98.8 F (37.1 C)   98 F (36.7 C)  TempSrc: Oral   Oral  SpO2: 97% 99%  100%  Weight:      Height:        Intake/Output Summary (Last 24 hours) at 09/22/2019 0944 Last data filed at 09/22/2019 0600 Gross per 24 hour  Intake  3259.76 ml  Output 1900 ml  Net 1359.76 ml   Filed Weights   09/19/19 0500 09/20/19 0500 09/21/19 0533  Weight: 63.2 kg 64.4 kg 64.2 kg    Examination:  General exam: Appears calm and comfortable  Respiratory system: Clear to auscultation. Respiratory effort normal. Cardiovascular system: S1 & S2 heard, RRR. No JVD, murmurs, rubs, gallops or clicks. No pedal edema. Gastrointestinal system: Abdomen is nondistended, soft and nontender. No organomegaly or masses felt. Normal bowel sounds heard. Central nervous system:.'s of confusion  \extremities: Symmetric 5 x 5 power. Skin: No rashes, lesions or ulcers Psychiatry: Unable to assess  Data Reviewed: I have personally reviewed following labs and imaging studies  CBC: Recent Labs  Lab 09/16/19 0825 09/16/19 1201 09/17/19 0146 09/18/19 0203 09/18/19 1524 09/19/19 0811 09/20/19 0831 09/21/19 0445 09/22/19 0447  WBC 26.8* 23.8* 14.2* 11.9*  --   --   --   --   --   NEUTROABS 22.5*  --   --   --   --   --   --   --   --   HGB 13.8 11.9* 12.7 10.5* 9.0* 9.0* 9.1* 9.7* 9.6*  HCT 47.1* 38.6 40.2 33.9* 27.6* 28.0* 28.9* 31.1* 30.8*  MCV 99.2 94.8 95.0 95.2  --   --   --   --   --   PLT 348 325 268 231  --   --   --   --   --    Basic Metabolic Panel: Recent Labs  Lab 09/16/19 0825 09/16/19 1201 09/16/19 1409 09/17/19 0146 09/18/19 0203 09/19/19 0811  NA 136  --  142 140 139 138  K 5.6*  --  5.1 5.2* 4.1 3.9  CL 103  --  111 111 111 108  CO2 15*  --  20* 17* 17* 22  GLUCOSE 249*  --  167* 109* 140* 129*  BUN 75*  --  72* 72* 47* 21  CREATININE 1.60* 1.45* 1.50* 1.46* 1.03* 0.59  CALCIUM 9.4  --  8.3* 8.4* 8.4* 8.1*  MG  --   --   --  1.9  --   --   PHOS  --   --   --  4.3  --   --    GFR: Estimated Creatinine Clearance: 54.7 mL/min (by C-G formula based on SCr of 0.59 mg/dL). Liver Function Tests: Recent Labs  Lab 09/16/19 0825 09/19/19 0811  AST 48* 24  ALT 39 30  ALKPHOS 111 109  BILITOT 1.0 0.5  PROT 5.8*  4.7*  ALBUMIN 3.1* 2.4*   Recent Labs  Lab 09/16/19 0825  LIPASE 23   No results for input(s): AMMONIA in the last 168 hours. Coagulation Profile: Recent Labs  Lab 09/16/19 0859  INR 1.0   Cardiac Enzymes: No results for input(s): CKTOTAL, CKMB, CKMBINDEX, TROPONINI in the last 168 hours. BNP (last 3 results) No results for input(s): PROBNP in the last 8760 hours. HbA1C: No results for input(s): HGBA1C  in the last 72 hours. CBG: Recent Labs  Lab 09/20/19 1729 09/20/19 2245 09/21/19 0726 09/21/19 1653 09/22/19 0722  GLUCAP 129* 110* 103* 124* 117*   Lipid Profile: No results for input(s): CHOL, HDL, LDLCALC, TRIG, CHOLHDL, LDLDIRECT in the last 72 hours. Thyroid Function Tests: No results for input(s): TSH, T4TOTAL, FREET4, T3FREE, THYROIDAB in the last 72 hours. Anemia Panel: No results for input(s): VITAMINB12, FOLATE, FERRITIN, TIBC, IRON, RETICCTPCT in the last 72 hours. Sepsis Labs: Recent Labs  Lab 09/16/19 0859 09/16/19 1102 09/16/19 1201 09/16/19 1409  PROCALCITON  --   --  3.14  --   LATICACIDVEN THIS TEST WAS ORDERED IN ERROR AND HAS BEEN CREDITED. 3.5* 2.7* 2.0*    Recent Results (from the past 240 hour(s))  Blood Culture (routine x 2)     Status: None   Collection Time: 09/16/19  8:59 AM   Specimen: BLOOD  Result Value Ref Range Status   Specimen Description   Final    BLOOD BLOOD RIGHT FOREARM Performed at [facility] [address], [place], [place] [address]    Special Requests   Final    BOTTLES DRAWN AEROBIC AND ANAEROBIC Blood Culture adequate volume Performed at [facility] [address] [place], [place] [address]    Culture   Final    NO GROWTH 5 DAYS Performed at [facility], [place] [address] [place], [place] [address]    Report Status 09/21/2019 FINAL  Final  Blood Culture (routine x 2)     Status: None   Collection Time: 09/16/19  8:59 AM   Specimen: BLOOD  Result Value Ref  Range Status   Specimen Description   Final    BLOOD RIGHT ANTECUBITAL Performed at [facility] [address], [place], [place] [address]    Special Requests   Final    BOTTLES DRAWN AEROBIC AND ANAEROBIC Blood Culture results may not be optimal due to an excessive volume of blood received in culture bottles Performed at [facility] [address], [place], [place] [address]    Culture   Final    NO GROWTH 5 DAYS Performed at [facility], [place] [address] [place], [place] [address]    Report Status 09/21/2019 FINAL  Final  Urine culture     Status: Abnormal   Collection Time: 09/16/19  8:59 AM   Specimen: In/Out Cath Urine  Result Value Ref Range Status   Specimen Description   Final    IN/OUT CATH URINE Performed at [facility] [address] [place], [place] [address]    Special Requests   Final    NONE Performed at [facility], [place] [address], [place], [place] [address]    Culture (A)  Final    >=100,000 COLONIES/mL PROTEUS MIRABILIS 50,000 COLONIES/mL KLEBSIELLA PNEUMONIAE    Report Status 09/20/2019 FINAL  Final   Organism ID, Bacteria PROTEUS MIRABILIS (A)  Final   Organism ID, Bacteria KLEBSIELLA PNEUMONIAE (A)  Final      Susceptibility   Klebsiella pneumoniae - MIC*    AMPICILLIN >=32 RESISTANT Resistant     CEFAZOLIN <=4 SENSITIVE Sensitive     CEFTRIAXONE <=1 SENSITIVE Sensitive     CIPROFLOXACIN <=0.25 SENSITIVE Sensitive     GENTAMICIN <=1 SENSITIVE Sensitive     IMIPENEM <=0.25 SENSITIVE Sensitive     NITROFURANTOIN 64 INTERMEDIATE Intermediate     TRIMETH/SULFA >=320 RESISTANT Resistant  AMPICILLIN/SULBACTAM 4 SENSITIVE Sensitive     PIP/TAZO <=4 SENSITIVE Sensitive     * 50,000 COLONIES/mL KLEBSIELLA PNEUMONIAE   Proteus mirabilis - MIC*    AMPICILLIN <=2 SENSITIVE Sensitive     CEFAZOLIN <=4 SENSITIVE Sensitive     CEFTRIAXONE <=1 SENSITIVE Sensitive      CIPROFLOXACIN >=4 RESISTANT Resistant     GENTAMICIN <=1 SENSITIVE Sensitive     IMIPENEM 2 SENSITIVE Sensitive     NITROFURANTOIN 128 RESISTANT Resistant     TRIMETH/SULFA >=320 RESISTANT Resistant     AMPICILLIN/SULBACTAM <=2 SENSITIVE Sensitive     PIP/TAZO <=4 SENSITIVE Sensitive     * >=100,000 COLONIES/mL PROTEUS MIRABILIS  Respiratory Panel by RT PCR (Flu A&B, Covid) - Nasopharyngeal Swab     Status: None   Collection Time: 09/16/19  9:25 AM   Specimen: Nasopharyngeal Swab  Result Value Ref Range Status   SARS Coronavirus 2 by RT PCR NEGATIVE NEGATIVE Final    Comment: (NOTE) SARS-CoV-2 target nucleic acids are NOT DETECTED. The SARS-CoV-2 RNA is generally detectable in upper respiratoy specimens during the acute phase of infection. The lowest concentration of SARS-CoV-2 viral copies this assay can detect is 131 copies/mL. A negative result does not preclude SARS-Cov-2 infection and should not be used as the sole basis for treatment or other patient management decisions. A negative result may occur with  improper specimen collection/handling, submission of specimen other than nasopharyngeal swab, presence of viral mutation(s) within the areas targeted by this assay, and inadequate number of viral copies (<131 copies/mL). A negative result must be combined with clinical observations, patient history, and epidemiological information. The expected result is Negative. Fact Sheet for Patients:  https://www.moore.com/https://www.fda.gov/media/142436/download Fact Sheet for Healthcare Providers:  https://www.young.biz/https://www.fda.gov/media/142435/download This test is not yet ap proved or cleared by the [place] FDA and  has been authorized for detection and/or diagnosis of SARS-CoV-2 by FDA under an Emergency Use Authorization (EUA). This EUA will remain  in effect (meaning this test can be used) for the duration of the COVID-19 declaration under Section 564(b)(1) of the Act, 21 U.S.C. section 360bbb-3(b)(1),  unless the authorization is terminated or revoked sooner.    Influenza A by PCR NEGATIVE NEGATIVE Final   Influenza B by PCR NEGATIVE NEGATIVE Final    Comment: (NOTE) The Xpert Xpress SARS-CoV-2/FLU/RSV assay is intended as an aid in  the diagnosis of influenza from Nasopharyngeal swab specimens and  should not be used as a sole basis for treatment. Nasal washings and  aspirates are unacceptable for Xpert Xpress SARS-CoV-2/FLU/RSV  testing. Fact Sheet for Patients: https://www.moore.com/https://www.fda.gov/media/142436/download Fact Sheet for Healthcare Providers: https://www.young.biz/https://www.fda.gov/media/142435/download This test is not yet approved or cleared by the [place] FDA and  has been authorized for detection and/or diagnosis of SARS-CoV-2 by  FDA under an Emergency Use Authorization (EUA). This EUA will remain  in effect (meaning this test can be used) for the duration of the  Covid-19 declaration under Section 564(b)(1) of the Act, 21  U.S.C. section 360bbb-3(b)(1), unless the authorization is  terminated or revoked. Performed at [facility], [address] [place], [place] [address]   MRSA PCR Screening     Status: None   Collection Time: 09/16/19  6:59 PM   Specimen: Nasal Mucosa; Nasopharyngeal  Result Value Ref Range Status   MRSA by PCR NEGATIVE NEGATIVE Final    Comment:        The GeneXpert MRSA Assay (FDA approved for NASAL specimens only), is one component of  a comprehensive MRSA colonization surveillance program. It is not intended to diagnose MRSA infection nor to guide or monitor treatment for MRSA infections. Performed at [facility], [address], [place], [place] [address]   SARS CORONAVIRUS 2 (TAT 6-24 HRS) Nasopharyngeal Nasopharyngeal Swab     Status: None   Collection Time: 09/21/19 11:20 AM   Specimen: Nasopharyngeal Swab  Result Value Ref Range Status   SARS Coronavirus 2 NEGATIVE NEGATIVE Final    Comment: (NOTE) SARS-CoV-2  target nucleic acids are NOT DETECTED. The SARS-CoV-2 RNA is generally detectable in upper and lower respiratory specimens during the acute phase of infection. Negative results do not preclude SARS-CoV-2 infection, do not rule out co-infections with other pathogens, and should not be used as the sole basis for treatment or other patient management decisions. Negative results must be combined with clinical observations, patient history, and epidemiological information. The expected result is Negative. Fact Sheet for Patients: HairSlick.no Fact Sheet for Healthcare Providers: quierodirigir.com This test is not yet approved or cleared by the [place] FDA and  has been authorized for detection and/or diagnosis of SARS-CoV-2 by FDA under an Emergency Use Authorization (EUA). This EUA will remain  in effect (meaning this test can be used) for the duration of the COVID-19 declaration under Section 56 4(b)(1) of the Act, 21 U.S.C. section 360bbb-3(b)(1), unless the authorization is terminated or revoked sooner. Performed at [facility], [address], [place], [place] [address]          Radiology Studies: No results found.      Scheduled Meds: . amLODipine  5 mg Oral Daily  . enoxaparin (LOVENOX) injection  40 mg Subcutaneous Q24H  . famotidine  20 mg Oral Daily  . hydrALAZINE  75 mg Oral Q8H  . lisinopril  20 mg Oral Daily   Continuous Infusions: . cefTRIAXone (ROCEPHIN)  IV 1 g (09/21/19 2052)  . lactated ringers 100 mL/hr at 09/22/19 0930     LOS: 6 days     Alwyn Ren, MD [facility] If 7PM-7AM, please contact night-coverage [email or web address] Password [email or web address] 09/22/2019, 9:44 AM

## 2019-09-23 ENCOUNTER — Other Ambulatory Visit: Payer: Self-pay

## 2019-09-23 LAB — GLUCOSE, CAPILLARY
Glucose-Capillary: 98 mg/dL (ref 70–99)
Glucose-Capillary: 99 mg/dL (ref 70–99)
Glucose-Capillary: 145 mg/dL — ABNORMAL HIGH (ref 70–99)

## 2019-09-23 LAB — HEMOGLOBIN AND HEMATOCRIT, BLOOD
HCT: 28.4 % — ABNORMAL LOW (ref 36.0–46.0)
Hemoglobin: 8.9 g/dL — ABNORMAL LOW (ref 12.0–15.0)

## 2019-09-23 LAB — SARS CORONAVIRUS 2 (TAT 6-24 HRS): SARS Coronavirus 2: NEGATIVE

## 2019-09-23 MED ORDER — AMLODIPINE BESYLATE 10 MG PO TABS
10.0000 mg | ORAL_TABLET | Freq: Every day | ORAL | Status: DC
  Administered 2019-09-24: 10 mg via ORAL
  Filled 2019-09-23: qty 1

## 2019-09-23 MED ORDER — SODIUM CHLORIDE 0.9% FLUSH: 10.0000 mL | INTRAVENOUS | Status: DC | PRN

## 2019-09-23 NOTE — Care Management Important Message (Signed)
Important Message  Patient Details IM Letter given to Velva Harman RN Case Manager to present to the Patient Name: SKYLEE BAIRD MRN: [id number] Date of Birth: Jan 01, 1946   Medicare Important Message Given:  Yes     Kerin Salen 09/23/2019, 3:18 PM

## 2019-09-23 NOTE — TOC Progression Note (Addendum)
Transition of Care [facility]) - Progression Note    Patient Details  Name: Janice Little MRN: [id number] Date of Birth: September 04, 1946  [facility]) CM/SW Contact  Leeroy Cha, RN Phone Number: 09/23/2019, 12:58 PM  Clinical Narrative:    tcf-Janie at [facility] can come tomorrow will need updated fl2-faxed to facility. Last covid test on 121920  Expected Discharge Plan: [place]    Expected Discharge Plan and Services Expected Discharge Plan: [place] arrangements for the past 2 months: [place]                                       Social Determinants of Health (SDOH) Interventions    Readmission Risk Interventions Readmission Risk Prevention Plan 09/18/2019 12/24/2018  Post Dischage Appt - Complete  Medication Screening - Complete  Transportation Screening Complete Complete  PCP or Specialist Appt within 5-7 Days Complete -  Home Care Screening Complete -  Medication Review (RN CM) Complete -  Some recent data might be hidden

## 2019-09-23 NOTE — NC FL2 (Signed)
MEDICAID FL2 LEVEL OF CARE SCREENING TOOL     IDENTIFICATION  Patient Name: Janice Little Birthdate: 1946-03-17 Sex: female Admission Date (Current Location): 09/16/2019  [facility] and [place] Number:  Producer, television/film/video and Address:  [facility],  [address] [place], [place] [address]      Provider Number: [id number]  Attending Physician Name and Address:  Alwyn Ren, MD  Relative Name and Phone Number:       Current Level of Care: Hospital Recommended Level of Care: Skilled Nursing Facility Prior Approval Number:    Date Approved/Denied:   PASRR Number: [id number]  Discharge Plan: SNF    Current Diagnoses: Patient Active Problem List   Diagnosis Date Noted  . Gross hematuria   . Acute sepsis (HCC)   . Sepsis secondary to UTI (HCC)   . Diarrhea   . Elevated sed rate 08/12/2019  . Elevated C-reactive protein (CRP) 08/12/2019  . Left-sided neglect 08/01/2019  . Myalgia 08/01/2019  . Pressure injury of skin 03/04/2019  . Acute lower UTI   . Stroke (HCC) 03/02/2019  . Acute CVA (cerebrovascular accident) (HCC) 03/01/2019  . Hypertensive urgency 12/19/2018  . History of myelitis 12/26/2017  . History of optic neuritis 04/02/2015  . Diplopia 04/02/2015  . Urinary incontinence 04/02/2015  . Other fatigue 04/02/2015  . 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 Numbness 04/02/2015  . Ataxic gait 04/02/2015  . Essential tremor 04/02/2015  . PRIMARY HYPERPARATHYROIDISM 02/01/2008  . OSTEOPOROSIS 02/01/2008  . Diabetes mellitus with diabetic neuropathy (HCC) 01/31/2008  . Hyperlipidemia 01/31/2008  . Multiple sclerosis (HCC) 01/31/2008  . Optic neuritis 01/31/2008  . Essential hypertension 01/31/2008  . GERD 01/31/2008    Orientation RESPIRATION BLADDER Height & Weight     Self, Time, Situation, Place  Normal Continent Weight: 65.4 kg Height:  5\' 5"  (165.1 cm)  BEHAVIORAL SYMPTOMS/MOOD NEUROLOGICAL  BOWEL NUTRITION STATUS      Continent Diet(regular)  AMBULATORY STATUS COMMUNICATION OF NEEDS Skin   Extensive Assist   Normal                       Personal Care Assistance Level of Assistance  Bathing, Dressing, Feeding Bathing Assistance: Limited assistance Feeding assistance: Limited assistance Dressing Assistance: Limited assistance     Functional Limitations Info  Sight Sight Info: Adequate        SPECIAL CARE FACTORS FREQUENCY  PT (By licensed PT)     PT Frequency: 3-5 times weekly              Contractures Contractures Info: Not present    Additional Factors Info  Code Status Code Status Info: full             Current Medications (09/23/2019):  This is the current hospital active medication list Current Facility-Administered Medications  Medication Dose Route Frequency Provider Last Rate Last Admin  . acetaminophen (TYLENOL) tablet 650 mg  650 mg Oral Q4H PRN Icard, Bradley L, DO   650 mg at 09/22/19 2102  . [START ON 09/24/2019] amLODipine (NORVASC) tablet 10 mg  10 mg Oral Daily 09/26/2019, MD      . enoxaparin (LOVENOX) injection 40 mg  40 mg Subcutaneous Q24H Alwyn Ren, MD   40 mg at 09/22/19 2101  . famotidine (PEPCID) tablet 20 mg  20 mg Oral Daily 2102 T, RPH   20 mg at 09/23/19 09/25/19  . hydrALAZINE (APRESOLINE) injection  10 mg  10 mg Intravenous Q6H PRN Omar Person, NP   10 mg at 09/19/19 1213  . hydrALAZINE (APRESOLINE) tablet 75 mg  75 mg Oral Q8H Lucky Cowboy, MD   75 mg at 09/23/19 0537  . lactated ringers infusion   Intravenous Continuous Lucky Cowboy, MD 100 mL/hr at 09/22/19 2107 New Bag at 09/22/19 2107  . lisinopril (ZESTRIL) tablet 20 mg  20 mg Oral Daily Lucky Cowboy, MD   20 mg at 09/23/19 1443  . ondansetron (ZOFRAN) injection 4 mg  4 mg Intravenous Q6H PRN Icard, Octavio Graves, DO         Discharge Medications: Please see discharge summary for a list of discharge medications.  Relevant  Imaging Results:  Relevant Lab Results:   Additional Information SSI: [id number]  Leeroy Cha, RN

## 2019-09-23 NOTE — Progress Notes (Signed)
Patient has an order for cardiac monitoring on 09/16/2019. MD paged to clarify if patient  still needs tele.

## 2019-09-23 NOTE — Progress Notes (Signed)
Cannot complete Malnutrition Screen secondary to patient being a poor historian.   Will continue to document meals and PO intake

## 2019-09-23 NOTE — Progress Notes (Signed)
PROGRESS NOTE    Janice NimsCarol T Little  ZOX:[id number] DOB: 04/28/1946 DOA: 09/16/2019 PCP: Juluis RainierBarnes, Rayelle Armor, MD    Brief Narrative: 73 year old nursing home resident with PMH of CKD stage II, type 2 diabetes, chronic pain, GERD, hypertension, multiple sclerosis, prior CVA admitted with abdominal pain, nausea, diarrhea  Assessment & Plan:   Active Problems:   Diabetes mellitus with diabetic neuropathy (HCC)   Essential hypertension   Acute sepsis (HCC)   Sepsis secondary to UTI (HCC)   Diarrhea   Gross hematuria  #1 sepsis secondary to UTI - Blood culture no growth so far. CT of the abdomen and pelvis shows findings consistent with cystitis and pyelonephritis. Urine culture growing Proteus mirabilis and Klebsiella pneumonia.  Both are sensitive to Rocephin. She received Rocephin from the 16th to the 21st.  #2 status post gross hematuria resolved hemoglobin dropped from 13.8 to 9 part of which was due to hemodilution secondary to IV hydration.  Likely secondary to UTI.  Urology following.  Hemoglobin is 8.9 today down from 9.6 yesterday.  #3 AKI on CKD stage II baseline creatinine 1.1 this is resolved with IV fluids.  Creatinine stable at 0.59.  #4 MS not on any meds as an outpatient  #5 type 2 diabetes was on Metformin and sitagliptin prior to admission.    Blood glucose last 24 hours 117, 169, 142, 98, 79.  She probably will not need Metformin and sitagliptin restarted as of now.  She is eating 70% of her meals.  #6 incidental pulmonary nodule needs outpatient follow-up.  #7 hypertension Home medications have been restarted already on amlodipine and hydralazine.  Blood pressure remained elevated so lisinopril was added.  Continue all for now.  Blood pressure today 169 x 60 167/81.   Pressure Injury 02/20/19 Other (Comment) Upper Stage II -  Partial thickness loss of dermis presenting as a shallow open ulcer with a red, pink wound bed without slough. (Active)  02/20/19 2030   Location: Other (Comment)  Location Orientation: Upper  Staging: Stage II -  Partial thickness loss of dermis presenting as a shallow open ulcer with a red, pink wound bed without slough.  Wound Description (Comments):   Present on Admission: No     Pressure Injury 09/16/19 Sacrum Posterior Stage I -  Intact skin with non-blanchable redness of a localized area usually over a bony prominence. (Active)  09/16/19 1830  Location: Sacrum  Location Orientation: Posterior  Staging: Stage I -  Intact skin with non-blanchable redness of a localized area usually over a bony prominence.  Wound Description (Comments):   Present on Admission:      Pressure Injury 09/16/19 Heel Right;Left Stage I -  Intact skin with non-blanchable redness of a localized area usually over a bony prominence. (Active)  09/16/19 1830  Location: Heel  Location Orientation: Right;Left  Staging: Stage I -  Intact skin with non-blanchable redness of a localized area usually over a bony prominence.  Wound Description (Comments):   Present on Admission: Yes     Estimated body mass index is 23.99 kg/m as calculated from the following:   Height as of this encounter: 5\' 5"  (1.651 m).   Weight as of this encounter: 65.4 kg.  DVT prophylaxis: Lovenox Code Status:Full code Family Communication:  Attempted to call her son Eston EstersChris Brown with no response. Disposition Plan: Patient is a long-term care resident discharge [facility] however apparently they have not held her bed. Patient to go  back to [facility].  Covid ordered for today.   TOC consult Consultants:  Patient initially seen by critical care  Procedures:  None  Antimicrobials:   ceftriaxone  Subjective:  She does not awake or respond to questions or name being called however staff reports she eats most of her meals. Objective: Vitals:   09/22/19 1424 09/22/19 2016 09/23/19 0500 09/23/19 0541  BP:  (!) 150/68 (!) 152/62  (!) 169/60  Pulse: (!) 103 (!) 106  95  Resp: 16 16    Temp: 98.7 F (37.1 C) 98.5 F (36.9 C)    TempSrc: Oral Oral    SpO2: 97% 94%    Weight:   65.4 kg   Height:        Intake/Output Summary (Last 24 hours) at 09/23/2019 1316 Last data filed at 09/23/2019 0930 Gross per 24 hour  Intake 2745.51 ml  Output 1400 ml  Net 1345.51 ml   Filed Weights   09/20/19 0500 09/21/19 0533 09/23/19 0500  Weight: 64.4 kg 64.2 kg 65.4 kg    Examination:  General exam: Appears calm and comfortable  Respiratory system: Clear to auscultation. Respiratory effort normal. Cardiovascular system: S1 & S2 heard, RRR. No JVD, murmurs, rubs, gallops or clicks. No pedal edema. Gastrointestinal system: Abdomen is nondistended, soft and nontender. No organomegaly or masses felt. Normal bowel sounds heard. Central nervous system: Does not respond to questions extremities: Symmetric 5 x 5 power. Skin: No rashes, lesions or ulcers Psychiatry: Unable to assess    Data Reviewed: I have personally reviewed following labs and imaging studies  CBC: Recent Labs  Lab 09/17/19 0146 09/18/19 0203 09/19/19 0811 09/20/19 0831 09/21/19 0445 09/22/19 0447 09/23/19 0410  WBC 14.2* 11.9*  --   --   --   --   --   HGB 12.7 10.5* 9.0* 9.1* 9.7* 9.6* 8.9*  HCT 40.2 33.9* 28.0* 28.9* 31.1* 30.8* 28.4*  MCV 95.0 95.2  --   --   --   --   --   PLT 268 231  --   --   --   --   --    Basic Metabolic Panel: Recent Labs  Lab 09/16/19 1409 09/17/19 0146 09/18/19 0203 09/19/19 0811  NA 142 140 139 138  K 5.1 5.2* 4.1 3.9  CL 111 111 111 108  CO2 20* 17* 17* 22  GLUCOSE 167* 109* 140* 129*  BUN 72* 72* 47* 21  CREATININE 1.50* 1.46* 1.03* 0.59  CALCIUM 8.3* 8.4* 8.4* 8.1*  MG  --  1.9  --   --   PHOS  --  4.3  --   --    GFR: Estimated Creatinine Clearance: 54.7 mL/min (by C-G formula based on SCr of 0.59 mg/dL). Liver Function Tests: Recent Labs  Lab 09/19/19 0811  AST 24    ALT 30  ALKPHOS 109  BILITOT 0.5  PROT 4.7*  ALBUMIN 2.4*   No results for input(s): LIPASE, AMYLASE in the last 168 hours. No results for input(s): AMMONIA in the last 168 hours. Coagulation Profile: No results for input(s): INR, PROTIME in the last 168 hours. Cardiac Enzymes: No results for input(s): CKTOTAL, CKMB, CKMBINDEX, TROPONINI in the last 168 hours. BNP (last 3 results) No results for input(s): PROBNP in the last 8760 hours. HbA1C: No results for input(s): HGBA1C in the last 72 hours. CBG: Recent Labs  Lab 09/22/19 0722 09/22/19 1610 09/22/19 1943 09/23/19 0410 09/23/19 0746  GLUCAP 117* 169* 142* 98 99  Lipid Profile: No results for input(s): CHOL, HDL, LDLCALC, TRIG, CHOLHDL, LDLDIRECT in the last 72 hours. Thyroid Function Tests: No results for input(s): TSH, T4TOTAL, FREET4, T3FREE, THYROIDAB in the last 72 hours. Anemia Panel: No results for input(s): VITAMINB12, FOLATE, FERRITIN, TIBC, IRON, RETICCTPCT in the last 72 hours. Sepsis Labs: Recent Labs  Lab 09/16/19 1409  LATICACIDVEN 2.0*    Recent Results (from the past 240 hour(s))  Blood Culture (routine x 2)     Status: None   Collection Time: 09/16/19  8:59 AM   Specimen: BLOOD  Result Value Ref Range Status   Specimen Description   Final    BLOOD BLOOD RIGHT FOREARM Performed at [facility], [address] [place], [place] [address]    Special Requests   Final    BOTTLES DRAWN AEROBIC AND ANAEROBIC Blood Culture adequate volume Performed at [facility], [address] [place], [place] [address]    Culture   Final    NO GROWTH 5 DAYS Performed at [facility], [address] [place], [place] [address]    Report Status 09/21/2019 FINAL  Final  Blood Culture (routine x 2)     Status: None   Collection Time: 09/16/19  8:59 AM   Specimen: BLOOD  Result Value Ref Range Status   Specimen Description   Final    BLOOD RIGHT  ANTECUBITAL Performed at [facility], [address] [place], [place] [address]    Special Requests   Final    BOTTLES DRAWN AEROBIC AND ANAEROBIC Blood Culture results may not be optimal due to an excessive volume of blood received in culture bottles Performed at [facility], [address] [place], [place] [address]    Culture   Final    NO GROWTH 5 DAYS Performed at [facility], [address] [place], [place] [address]    Report Status 09/21/2019 FINAL  Final  Urine culture     Status: Abnormal   Collection Time: 09/16/19  8:59 AM   Specimen: In/Out Cath Urine  Result Value Ref Range Status   Specimen Description   Final    IN/OUT CATH URINE Performed at [facility], [address] [place], [place] [address]    Special Requests   Final    NONE Performed at [facility], [address] [place], [place] [address]    Culture (A)  Final    >=100,000 COLONIES/mL PROTEUS MIRABILIS 50,000 COLONIES/mL KLEBSIELLA PNEUMONIAE    Report Status 09/20/2019 FINAL  Final   Organism ID, Bacteria PROTEUS MIRABILIS (A)  Final   Organism ID, Bacteria KLEBSIELLA PNEUMONIAE (A)  Final      Susceptibility   Klebsiella pneumoniae - MIC*    AMPICILLIN >=32 RESISTANT Resistant     CEFAZOLIN <=4 SENSITIVE Sensitive     CEFTRIAXONE <=1 SENSITIVE Sensitive     CIPROFLOXACIN <=0.25 SENSITIVE Sensitive     GENTAMICIN <=1 SENSITIVE Sensitive     IMIPENEM <=0.25 SENSITIVE Sensitive     NITROFURANTOIN 64 INTERMEDIATE Intermediate     TRIMETH/SULFA >=320 RESISTANT Resistant     AMPICILLIN/SULBACTAM 4 SENSITIVE Sensitive     PIP/TAZO <=4 SENSITIVE Sensitive     * 50,000 COLONIES/mL KLEBSIELLA PNEUMONIAE   Proteus mirabilis - MIC*    AMPICILLIN <=2 SENSITIVE Sensitive     CEFAZOLIN <=4 SENSITIVE Sensitive     CEFTRIAXONE <=1 SENSITIVE Sensitive     CIPROFLOXACIN >=4  RESISTANT Resistant     GENTAMICIN <=1  SENSITIVE Sensitive     IMIPENEM 2 SENSITIVE Sensitive     NITROFURANTOIN 128 RESISTANT Resistant     TRIMETH/SULFA >=320 RESISTANT Resistant     AMPICILLIN/SULBACTAM <=2 SENSITIVE Sensitive     PIP/TAZO <=4 SENSITIVE Sensitive     * >=100,000 COLONIES/mL PROTEUS MIRABILIS  Respiratory Panel by RT PCR (Flu A&B, Covid) - Nasopharyngeal Swab     Status: None   Collection Time: 09/16/19  9:25 AM   Specimen: Nasopharyngeal Swab  Result Value Ref Range Status   SARS Coronavirus 2 by RT PCR NEGATIVE NEGATIVE Final    Comment: (NOTE) SARS-CoV-2 target nucleic acids are NOT DETECTED. The SARS-CoV-2 RNA is generally detectable in upper respiratoy specimens during the acute phase of infection. The lowest concentration of SARS-CoV-2 viral copies this assay can detect is 131 copies/mL. A negative result does not preclude SARS-Cov-2 infection and should not be used as the sole basis for treatment or other patient management decisions. A negative result may occur with  improper specimen collection/handling, submission of specimen other than nasopharyngeal swab, presence of viral mutation(s) within the areas targeted by this assay, and inadequate number of viral copies (<131 copies/mL). A negative result must be combined with clinical observations, patient history, and epidemiological information. The expected result is Negative. Fact Sheet for Patients:  PinkCheek.be Fact Sheet for Healthcare Providers:  GravelBags.it This test is not yet ap proved or cleared by the [place] FDA and  has been authorized for detection and/or diagnosis of SARS-CoV-2 by FDA under an Emergency Use Authorization (EUA). This EUA will remain  in effect (meaning this test can be used) for the duration of the COVID-19 declaration under Section 564(b)(1) of the Act, 21 U.S.C. section 360bbb-3(b)(1), unless the authorization is terminated or revoked sooner.     Influenza A by PCR NEGATIVE NEGATIVE Final   Influenza B by PCR NEGATIVE NEGATIVE Final    Comment: (NOTE) The Xpert Xpress SARS-CoV-2/FLU/RSV assay is intended as an aid in  the diagnosis of influenza from Nasopharyngeal swab specimens and  should not be used as a sole basis for treatment. Nasal washings and  aspirates are unacceptable for Xpert Xpress SARS-CoV-2/FLU/RSV  testing. Fact Sheet for Patients: PinkCheek.be Fact Sheet for Healthcare Providers: GravelBags.it This test is not yet approved or cleared by the [place] FDA and  has been authorized for detection and/or diagnosis of SARS-CoV-2 by  FDA under an Emergency Use Authorization (EUA). This EUA will remain  in effect (meaning this test can be used) for the duration of the  Covid-19 declaration under Section 564(b)(1) of the Act, 21  U.S.C. section 360bbb-3(b)(1), unless the authorization is  terminated or revoked. Performed at [facility], [place] [address] [place], [place] [address]   MRSA PCR Screening     Status: None   Collection Time: 09/16/19  6:59 PM   Specimen: Nasal Mucosa; Nasopharyngeal  Result Value Ref Range Status   MRSA by PCR NEGATIVE NEGATIVE Final    Comment:        The GeneXpert MRSA Assay (FDA approved for NASAL specimens only), is one component of a comprehensive MRSA colonization surveillance program. It is not intended to diagnose MRSA infection nor to guide or monitor treatment for MRSA infections. Performed at [facility], [place] [address], [place], [place] [address]   SARS CORONAVIRUS 2 (TAT 6-24 HRS) Nasopharyngeal Nasopharyngeal Swab     Status: None   Collection  Time: 09/21/19 11:20 AM   Specimen: Nasopharyngeal Swab  Result Value Ref Range Status   SARS Coronavirus 2 NEGATIVE NEGATIVE Final    Comment: (NOTE) SARS-CoV-2 target nucleic acids are NOT DETECTED. The SARS-CoV-2 RNA is  generally detectable in upper and lower respiratory specimens during the acute phase of infection. Negative results do not preclude SARS-CoV-2 infection, do not rule out co-infections with other pathogens, and should not be used as the sole basis for treatment or other patient management decisions. Negative results must be combined with clinical observations, patient history, and epidemiological information. The expected result is Negative. Fact Sheet for Patients: HairSlick.no Fact Sheet for Healthcare Providers: quierodirigir.com This test is not yet approved or cleared by the [place] FDA and  has been authorized for detection and/or diagnosis of SARS-CoV-2 by FDA under an Emergency Use Authorization (EUA). This EUA will remain  in effect (meaning this test can be used) for the duration of the COVID-19 declaration under Section 56 4(b)(1) of the Act, 21 U.S.C. section 360bbb-3(b)(1), unless the authorization is terminated or revoked sooner. Performed at [facility], [address] [place], [place] [address]          Radiology Studies: No results found.      Scheduled Meds: . amLODipine  5 mg Oral Daily  . enoxaparin (LOVENOX) injection  40 mg Subcutaneous Q24H  . famotidine  20 mg Oral Daily  . hydrALAZINE  75 mg Oral Q8H  . lisinopril  20 mg Oral Daily   Continuous Infusions: . cefTRIAXone (ROCEPHIN)  IV 1 g (09/22/19 2104)  . lactated ringers 100 mL/hr at 09/22/19 2107     LOS: 7 days     Alwyn Ren, MD [facility]  If 7PM-7AM, please contact night-coverage [email or web address] Password [email or web address] 09/23/2019, 1:16 PM

## 2019-09-23 NOTE — Plan of Care (Signed)

## 2019-09-24 DIAGNOSIS — I10 Essential (primary) hypertension: Secondary | ICD-10-CM

## 2019-09-24 LAB — GLUCOSE, CAPILLARY: Glucose-Capillary: 136 mg/dL — ABNORMAL HIGH (ref 70–99)

## 2019-09-24 MED ORDER — LIP MEDEX EX OINT
TOPICAL_OINTMENT | CUTANEOUS | Status: AC
  Filled 2019-09-24: qty 7

## 2019-09-24 NOTE — Progress Notes (Signed)
Spoke with Quillian Quince from [employer] for report and called [facility] for transport.

## 2019-09-24 NOTE — Plan of Care (Signed)

## 2019-09-24 NOTE — Progress Notes (Signed)
Attempted to call report to [facility], Mariann Laster in reception transferred me to [phone] and left voicemail. I called back 10 minutes later and attempted again, secretary Mariann Laster answered and stated Quillian Quince would give me a return call for report.

## 2019-09-24 NOTE — Discharge Summary (Signed)
Physician Discharge Summary  Janice Janice Little T Janice Little ZOX:[id number] DOB: 08/07/1946 DOA: 09/16/2019  PCP: Janice Janice Little  Admit date: 09/16/2019 Discharge date: 09/24/2019  Admitted From: Nursing home  disposition: Nursing home  Recommendations for Outpatient Follow-up:  1. Follow up with PCP in 1-2 weeks 2. Please obtain BMP/CBC in one week   Home Health none  equipment/Devices none Discharge Condition stable CODE STATUS full code Diet recommendation: Cardiac Brief/Interim Summary:73 year old nursing home resident with PMH of CKD stage II, type 2 diabetes, chronic pain, GERD, hypertension, multiple sclerosis, prior CVA admitted with abdominal pain, nausea, diarrhea   Discharge Diagnoses:  Active Problems:   Diabetes mellitus with diabetic neuropathy (HCC)   Essential hypertension   Acute sepsis (HCC)   Sepsis secondary to UTI (HCC)   Diarrhea   Gross hematuria  #1 sepsis secondary to UTI- Blood culture no growth so far. CT of the abdomen and pelvis shows findings consistent with cystitis and pyelonephritis. Urine culture growing Proteus mirabilis and Klebsiella pneumonia. Both are sensitive to Rocephin. She received Rocephin from the 16th to the 21st.  #2 status post gross hematuria resolved hemoglobin dropped from 13.8 to 9 part of which was due to hemodilution secondary to IV hydration. Hemoglobin 8.9 on the day of discharge.  #3 AKI on CKD stage II baseline creatinine 1.1 this is resolved with IV fluids. Creatinine stable at 0.59.  #4 MS not on any meds as an outpatient  #5 type 2 diabetes restart Metformin and sitagliptin.    #6 incidental pulmonary nodule needs outpatient follow-up.  #7 hypertension Home medications have been restarted already on amlodipine and hydralazine. Blood pressure remained elevated so lisinopril was added. Continue all for now. Blood pressure today 169 x 60 167/81.   Pressure Injury 02/20/19 Other (Comment) Upper Stage  II -  Partial thickness loss of dermis presenting as a shallow open ulcer with a red, pink wound bed without slough. (Active)  02/20/19 2030  Location: Other (Comment)  Location Orientation: Upper  Staging: Stage II -  Partial thickness loss of dermis presenting as a shallow open ulcer with a red, pink wound bed without slough.  Wound Description (Comments):   Present on Admission: No     Pressure Injury 09/16/19 Sacrum Posterior Stage I -  Intact skin with non-blanchable redness of a localized area usually over a bony prominence. (Active)  09/16/19 1830  Location: Sacrum  Location Orientation: Posterior  Staging: Stage I -  Intact skin with non-blanchable redness of a localized area usually over a bony prominence.  Wound Description (Comments):   Present on Admission:      Pressure Injury 09/16/19 Heel Right;Left Stage I -  Intact skin with non-blanchable redness of a localized area usually over a bony prominence. (Active)  09/16/19 1830  Location: Heel  Location Orientation: Right;Left  Staging: Stage I -  Intact skin with non-blanchable redness of a localized area usually over a bony prominence.  Wound Description (Comments):   Present on Admission: Yes    Estimated body mass index is 23.99 kg/m as calculated from the following:   Height as of this encounter: 5\' 5"  (1.651 m).   Weight as of this encounter: 65.4 kg.  Discharge Instructions  Discharge Instructions    Call Little for:  difficulty breathing, headache or visual disturbances   Complete by: As directed    Call Little for:  persistant nausea and vomiting   Complete by: As directed    Call Little for:  severe uncontrolled pain  Complete by: As directed    Call Little for:  temperature >100.4   Complete by: As directed    Diet - low sodium heart healthy   Complete by: As directed    Increase activity slowly   Complete by: As directed      Allergies as of 09/24/2019   No Known Allergies     Medication List    TAKE these  medications   acetaminophen 325 MG tablet Commonly known as: TYLENOL Take 2 tablets (650 mg total) by mouth every 4 (four) hours as needed for mild pain (or temp > 37.5 C (99.5 F)).   amLODipine 5 MG tablet Commonly known as: NORVASC Take 5 mg by mouth daily.   aspirin 325 MG tablet Take 1 tablet (325 mg total) by mouth daily.   Cerovite Senior Tabs Take 1 tablet by mouth daily.   DULoxetine 60 MG capsule Commonly known as: CYMBALTA Take 60 mg by mouth daily.   gabapentin 100 MG capsule Commonly known as: NEURONTIN Take 100 mg by mouth 3 (three) times daily.   hydrALAZINE 25 MG tablet Commonly known as: APRESOLINE Take 3 tablets (75 mg total) by mouth every 8 (eight) hours.   lisinopril 30 MG tablet Commonly known as: ZESTRIL Take 1 tablet (30 mg total) by mouth daily.   Melatonin 3 MG Tabs Take 3 mg by mouth at bedtime.   metFORMIN 500 MG 24 hr tablet Commonly known as: GLUCOPHAGE-XR Take 1 tablet (500 mg total) by mouth 2 (two) times a day.   omega-3 fish oil 1000 MG Caps capsule Commonly known as: MAXEPA Take 1,000 mg by mouth daily.   omeprazole 20 MG tablet Commonly known as: PRILOSEC OTC Take 20 mg by mouth daily.   polyethylene glycol powder 17 GM/SCOOP powder Commonly known as: GLYCOLAX/MIRALAX Take 17 g by mouth daily as needed for mild constipation.   predniSONE 10 MG tablet Commonly known as: DELTASONE Take 3 pills po daily.  Monitor sugars daily What changed:   how much to take  how to take this  when to take this  additional instructions   sitaGLIPtin 50 MG tablet Commonly known as: JANUVIA Take 50 mg by mouth daily.      Follow-up Information    Janice Janice Little Follow up.   Specialty: Family Medicine Contact information: [address] [place] [id number] [phone]          No Known Allergies  Consultations:  none   Procedures/Studies: CT ABDOMEN PELVIS WO CONTRAST  Result Date:  09/16/2019 CLINICAL DATA:  Abdominal distension EXAM: CT ABDOMEN AND PELVIS WITHOUT CONTRAST TECHNIQUE: Multidetector CT imaging of the abdomen and pelvis was performed following the standard protocol without IV contrast. COMPARISON:  MRI evaluation of August of 2015. FINDINGS: Lower chest: Left lower lobe pulmonary nodule (image 25, series 6) 7 mm. No signs of consolidation or evidence of pleural effusion. Hepatobiliary: Moderate to mark distension of the gallbladder with numerous stones and sludge layering dependently. No signs of pericholecystic stranding however. No signs of focal hepatic lesion. Pancreas: Unremarkable. No pancreatic ductal dilatation or surrounding inflammatory changes. Spleen: Normal in size without focal abnormality. Adrenals/Urinary Tract: Normal adrenal glands. Mild cortical loss, symmetric of the kidneys bilaterally. Mild-to-moderate ureteral distension. Distension of the renal pelvis particularly on the right with extrarenal pelves bilaterally. No signs of hydronephrosis. Right-sided perinephric stranding. No visible intrarenal or ureter calculus. Asymmetric thickening of the urinary bladder favors the dome of the urinary bladder, associated with Perivesicle  stranding. Stomach/Bowel: Normal appearance of the appendix. No signs of bowel obstruction or acute bowel process. Signs of colonic diverticulosis. Vascular/Lymphatic: Calcific atherosclerotic changes throughout the abdominal aorta. No signs of adenopathy in the upper abdomen or in the retroperitoneum. Reproductive: Post hysterectomy. No signs of pelvic adenopathy or adnexal mass. Other: No sign of free air. No focal fluid collection. Musculoskeletal: No signs of acute bone finding or destructive bone process. IMPRESSION: 1. Circumferential but asymmetric thickening of the urinary bladder associated with bilateral ureteral and right renal pelvic distension with perinephric stranding likely represents urinary tract infection, perhaps  cystitis and pyelonephritis. Given asymmetric bladder wall thickening is cystoscopic and/or clinical follow-up may be helpful to exclude underlying lesion of the urinary bladder. Appearance of urinary bladder could also be related to some degree to neurogenic bladder which would account for ureteral distension. 2. Moderate distension of the gallbladder without pericholecystic stranding and with signs of numerous gallstones. No signs of biliary ductal dilation. 3. 7 mm left lower lobe pulmonary nodule. Non-contrast chest CT at 6-12 months is recommended. If the nodule is stable at time of repeat CT, then future CT at 18-24 months (from today's scan) is considered optional for low-risk patients, but is recommended for high-risk patients. This recommendation follows the consensus statement: Guidelines for Management of Incidental Pulmonary Nodules Detected on CT Images: From the Fleischner Society 2017; Radiology 2017; 284:228-243. Finding is however suggested on the MRI study of 2015, if there are prior chest CTs comparison with these could also be helpful. 4. Colonic diverticulosis without evidence of acute diverticulitis. Aortic Atherosclerosis (ICD10-I70.0). Electronically Signed   By: Donzetta Kohut M.D.   On: 09/16/2019 10:51   DG Chest Port 1 View  Result Date: 09/16/2019 CLINICAL DATA:  Vomiting. EXAM: PORTABLE CHEST 1 VIEW COMPARISON:  Chest radiograph 02/28/2019 FINDINGS: A loop recorder projects over the left chest. Overlying cardiac monitoring leads. Surgical clips project over the lower neck. Heart size within normal limits. There is no airspace consolidation within the lungs. No evidence of pleural effusion or pneumothorax. No acute bony abnormality. IMPRESSION: No evidence of acute cardiopulmonary abnormality. Electronically Signed   By: Jackey Loge DO   On: 09/16/2019 08:52   CUP PACEART REMOTE DEVICE CHECK  Result Date: 09/19/2019 [facility] summary report received. Battery status OK. Normal  device function. No new symptom episodes, tachy episodes, brady, or pause episodes. No new AF episodes. Monthly summary reports and ROV/PRN Hassell Halim, RN, CCDS, [employer]   (Echo, Carotid, EGD, Colonoscopy, ERCP)    Subjective: She is sleeping staff reports she wakes up for breakfast eat some breakfast and then goes back to sleep  Discharge Exam: Vitals:   09/24/19 0552 09/24/19 0948  BP: (!) 166/62 (!) 165/56  Pulse: (!) 101 92  Resp:    Temp: 98.9 F (37.2 C)   SpO2: 95%    Vitals:   09/23/19 2234 09/24/19 0134 09/24/19 0552 09/24/19 0948  BP: (!) 172/62 (!) 166/55 (!) 166/62 (!) 165/56  Pulse: 98 (!) 102 (!) 101 92  Resp:  16    Temp:  98.7 F (37.1 C) 98.9 F (37.2 C)   TempSrc:  Oral Oral   SpO2:  94% 95%   Weight:      Height:        General: Pt is sleeping not in acute distress Cardiovascular: RRR, S1/S2 +, no rubs, no gallops Respiratory: CTA bilaterally, no wheezing, no rhonchi Abdominal: Soft, NT, ND, bowel sounds + Extremities: no edema,  no cyanosis    The results of significant diagnostics from this hospitalization (including imaging, microbiology, ancillary and laboratory) are listed below for reference.     Microbiology: Recent Results (from the past 240 hour(s))  Blood Culture (routine x 2)     Status: None   Collection Time: 09/16/19  8:59 AM   Specimen: BLOOD  Result Value Ref Range Status   Specimen Description   Final    BLOOD BLOOD RIGHT FOREARM Performed at [facility], [address], [place], [place] [address]    Special Requests   Final    BOTTLES DRAWN AEROBIC AND ANAEROBIC Blood Culture adequate volume Performed at [facility], [address], [place], [place] [address]    Culture   Final    NO GROWTH 5 DAYS Performed at [facility], [address], [place], [place] [address]    Report Status 09/21/2019 FINAL  Final  Blood Culture (routine x 2)     Status: None    Collection Time: 09/16/19  8:59 AM   Specimen: BLOOD  Result Value Ref Range Status   Specimen Description   Final    BLOOD RIGHT ANTECUBITAL Performed at [facility], [address] [place], [place] [address]    Special Requests   Final    BOTTLES DRAWN AEROBIC AND ANAEROBIC Blood Culture results may not be optimal due to an excessive volume of blood received in culture bottles Performed at [facility], [address], [place], [place] [address]    Culture   Final    NO GROWTH 5 DAYS Performed at [facility], [address] [place], [place] [address]    Report Status 09/21/2019 FINAL  Final  Urine culture     Status: Abnormal   Collection Time: 09/16/19  8:59 AM   Specimen: In/Out Cath Urine  Result Value Ref Range Status   Specimen Description   Final    IN/OUT CATH URINE Performed at [facility], [address] [place], [place] [address]    Special Requests   Final    NONE Performed at [facility], [address], [place], [place] [address]    Culture (A)  Final    >=100,000 COLONIES/mL PROTEUS MIRABILIS 50,000 COLONIES/mL KLEBSIELLA PNEUMONIAE    Report Status 09/20/2019 FINAL  Final   Organism ID, Bacteria PROTEUS MIRABILIS (A)  Final   Organism ID, Bacteria KLEBSIELLA PNEUMONIAE (A)  Final      Susceptibility   Klebsiella pneumoniae - MIC*    AMPICILLIN >=32 RESISTANT Resistant     CEFAZOLIN <=4 SENSITIVE Sensitive     CEFTRIAXONE <=1 SENSITIVE Sensitive     CIPROFLOXACIN <=0.25 SENSITIVE Sensitive     GENTAMICIN <=1 SENSITIVE Sensitive     IMIPENEM <=0.25 SENSITIVE Sensitive     NITROFURANTOIN 64 INTERMEDIATE Intermediate     TRIMETH/SULFA >=320 RESISTANT Resistant     AMPICILLIN/SULBACTAM 4 SENSITIVE Sensitive     PIP/TAZO <=4 SENSITIVE Sensitive     * 50,000 COLONIES/mL KLEBSIELLA PNEUMONIAE   Proteus mirabilis - MIC*    AMPICILLIN <=2 SENSITIVE Sensitive     CEFAZOLIN  <=4 SENSITIVE Sensitive     CEFTRIAXONE <=1 SENSITIVE Sensitive     CIPROFLOXACIN >=4 RESISTANT Resistant     GENTAMICIN <=1 SENSITIVE Sensitive     IMIPENEM 2 SENSITIVE Sensitive     NITROFURANTOIN 128 RESISTANT Resistant     TRIMETH/SULFA >=320 RESISTANT Resistant  AMPICILLIN/SULBACTAM <=2 SENSITIVE Sensitive     PIP/TAZO <=4 SENSITIVE Sensitive     * >=100,000 COLONIES/mL PROTEUS MIRABILIS  Respiratory Panel by RT PCR (Flu A&B, Covid) - Nasopharyngeal Swab     Status: None   Collection Time: 09/16/19  9:25 AM   Specimen: Nasopharyngeal Swab  Result Value Ref Range Status   SARS Coronavirus 2 by RT PCR NEGATIVE NEGATIVE Final    Comment: (NOTE) SARS-CoV-2 target nucleic acids are NOT DETECTED. The SARS-CoV-2 RNA is generally detectable in upper respiratoy specimens during the acute phase of infection. The lowest concentration of SARS-CoV-2 viral copies this assay can detect is 131 copies/mL. A negative result does not preclude SARS-Cov-2 infection and should not be used as the sole basis for treatment or other patient management decisions. A negative result may occur with  improper specimen collection/handling, submission of specimen other than nasopharyngeal swab, presence of viral mutation(s) within the areas targeted by this assay, and inadequate number of viral copies (<131 copies/mL). A negative result must be combined with clinical observations, patient history, and epidemiological information. The expected result is Negative. Fact Sheet for Patients:  https://www.moore.com/ Fact Sheet for Healthcare Providers:  https://www.young.biz/ This test is not yet ap proved or cleared by the [place] FDA and  has been authorized for detection and/or diagnosis of SARS-CoV-2 by FDA under an Emergency Use Authorization (EUA). This EUA will remain  in effect (meaning this test can be used) for the duration of the COVID-19 declaration under  Section 564(b)(1) of the Act, 21 U.S.C. section 360bbb-3(b)(1), unless the authorization is terminated or revoked sooner.    Influenza A by PCR NEGATIVE NEGATIVE Final   Influenza B by PCR NEGATIVE NEGATIVE Final    Comment: (NOTE) The Xpert Xpress SARS-CoV-2/FLU/RSV assay is intended as an aid in  the diagnosis of influenza from Nasopharyngeal swab specimens and  should not be used as a sole basis for treatment. Nasal washings and  aspirates are unacceptable for Xpert Xpress SARS-CoV-2/FLU/RSV  testing. Fact Sheet for Patients: https://www.moore.com/ Fact Sheet for Healthcare Providers: https://www.young.biz/ This test is not yet approved or cleared by the [place] FDA and  has been authorized for detection and/or diagnosis of SARS-CoV-2 by  FDA under an Emergency Use Authorization (EUA). This EUA will remain  in effect (meaning this test can be used) for the duration of the  Covid-19 declaration under Section 564(b)(1) of the Act, 21  U.S.C. section 360bbb-3(b)(1), unless the authorization is  terminated or revoked. Performed at [facility], [address] [place], [place] [address]   MRSA PCR Screening     Status: None   Collection Time: 09/16/19  6:59 PM   Specimen: Nasal Mucosa; Nasopharyngeal  Result Value Ref Range Status   MRSA by PCR NEGATIVE NEGATIVE Final    Comment:        The GeneXpert MRSA Assay (FDA approved for NASAL specimens only), is one component of a comprehensive MRSA colonization surveillance program. It is not intended to diagnose MRSA infection nor to guide or monitor treatment for MRSA infections. Performed at [facility], [address] [place], [place] [address]   SARS CORONAVIRUS 2 (TAT 6-24 HRS) Nasopharyngeal Nasopharyngeal Swab     Status: None   Collection Time: 09/21/19 11:20 AM   Specimen: Nasopharyngeal Swab  Result Value Ref Range Status   SARS  Coronavirus 2 NEGATIVE NEGATIVE Final    Comment: (NOTE) SARS-CoV-2 target nucleic acids are NOT DETECTED. The SARS-CoV-2 RNA  is generally detectable in upper and lower respiratory specimens during the acute phase of infection. Negative results do not preclude SARS-CoV-2 infection, do not rule out co-infections with other pathogens, and should not be used as the sole basis for treatment or other patient management decisions. Negative results must be combined with clinical observations, patient history, and epidemiological information. The expected result is Negative. Fact Sheet for Patients: HairSlick.no Fact Sheet for Healthcare Providers: quierodirigir.com This test is not yet approved or cleared by the [place] FDA and  has been authorized for detection and/or diagnosis of SARS-CoV-2 by FDA under an Emergency Use Authorization (EUA). This EUA will remain  in effect (meaning this test can be used) for the duration of the COVID-19 declaration under Section 56 4(b)(1) of the Act, 21 U.S.C. section 360bbb-3(b)(1), unless the authorization is terminated or revoked sooner. Performed at [facility], [address] [place], [place] [address]   SARS CORONAVIRUS 2 (TAT 6-24 HRS) Nasopharyngeal Nasopharyngeal Swab     Status: None   Collection Time: 09/23/19  1:19 PM   Specimen: Nasopharyngeal Swab  Result Value Ref Range Status   SARS Coronavirus 2 NEGATIVE NEGATIVE Final    Comment: (NOTE) SARS-CoV-2 target nucleic acids are NOT DETECTED. The SARS-CoV-2 RNA is generally detectable in upper and lower respiratory specimens during the acute phase of infection. Negative results do not preclude SARS-CoV-2 infection, do not rule out co-infections with other pathogens, and should not be used as the sole basis for treatment or other patient management decisions. Negative results must be combined with clinical  observations, patient history, and epidemiological information. The expected result is Negative. Fact Sheet for Patients: HairSlick.no Fact Sheet for Healthcare Providers: quierodirigir.com This test is not yet approved or cleared by the [place] FDA and  has been authorized for detection and/or diagnosis of SARS-CoV-2 by FDA under an Emergency Use Authorization (EUA). This EUA will remain  in effect (meaning this test can be used) for the duration of the COVID-19 declaration under Section 56 4(b)(1) of the Act, 21 U.S.C. section 360bbb-3(b)(1), unless the authorization is terminated or revoked sooner. Performed at [facility], [address] [place], [place] [address]      Labs: BNP (last 3 results) No results for input(s): BNP in the last 8760 hours. Basic Metabolic Panel: Recent Labs  Lab 09/18/19 0203 09/19/19 0811  NA 139 138  K 4.1 3.9  CL 111 108  CO2 17* 22  GLUCOSE 140* 129*  BUN 47* 21  CREATININE 1.03* 0.59  CALCIUM 8.4* 8.1*   Liver Function Tests: Recent Labs  Lab 09/19/19 0811  AST 24  ALT 30  ALKPHOS 109  BILITOT 0.5  PROT 4.7*  ALBUMIN 2.4*   No results for input(s): LIPASE, AMYLASE in the last 168 hours. No results for input(s): AMMONIA in the last 168 hours. CBC: Recent Labs  Lab 09/18/19 0203 09/19/19 0811 09/20/19 0831 09/21/19 0445 09/22/19 0447 09/23/19 0410  WBC 11.9*  --   --   --   --   --   HGB 10.5* 9.0* 9.1* 9.7* 9.6* 8.9*  HCT 33.9* 28.0* 28.9* 31.1* 30.8* 28.4*  MCV 95.2  --   --   --   --   --   PLT 231  --   --   --   --   --    Cardiac Enzymes: No results for input(s): CKTOTAL, CKMB, CKMBINDEX, TROPONINI in the last 168 hours. BNP: Invalid input(s):  POCBNP CBG: Recent Labs  Lab 09/22/19 1943 09/23/19 0410 09/23/19 0746 09/23/19 1716 09/24/19 0854  GLUCAP 142* 98 99 145* 136*   D-Dimer No results for input(s): DDIMER in the last 72  hours. Hgb A1c No results for input(s): HGBA1C in the last 72 hours. Lipid Profile No results for input(s): CHOL, HDL, LDLCALC, TRIG, CHOLHDL, LDLDIRECT in the last 72 hours. Thyroid function studies No results for input(s): TSH, T4TOTAL, T3FREE, THYROIDAB in the last 72 hours.  Invalid input(s): FREET3 Anemia work up No results for input(s): VITAMINB12, FOLATE, FERRITIN, TIBC, IRON, RETICCTPCT in the last 72 hours. Urinalysis    Component Value Date/Time   COLORURINE YELLOW 09/16/2019 0859   APPEARANCEUR TURBID (A) 09/16/2019 0859   LABSPEC 1.023 09/16/2019 0859   PHURINE 7.0 09/16/2019 0859   GLUCOSEU NEGATIVE 09/16/2019 0859   HGBUR NEGATIVE 09/16/2019 0859   BILIRUBINUR NEGATIVE 09/16/2019 0859   KETONESUR 5 (A) 09/16/2019 0859   PROTEINUR 100 (A) 09/16/2019 0859   UROBILINOGEN 0.2 11/19/2009 1055   NITRITE NEGATIVE 09/16/2019 0859   LEUKOCYTESUR MODERATE (A) 09/16/2019 0859   Sepsis Labs Invalid input(s): PROCALCITONIN,  WBC,  LACTICIDVEN Microbiology Recent Results (from the past 240 hour(s))  Blood Culture (routine x 2)     Status: None   Collection Time: 09/16/19  8:59 AM   Specimen: BLOOD  Result Value Ref Range Status   Specimen Description   Final    BLOOD BLOOD RIGHT FOREARM Performed at [facility], [place] [address] [place], [place] [address]    Special Requests   Final    BOTTLES DRAWN AEROBIC AND ANAEROBIC Blood Culture adequate volume Performed at [facility] [address] [place], [place] [address]    Culture   Final    NO GROWTH 5 DAYS Performed at [facility], [place] [address], [place], [place] [address]    Report Status 09/21/2019 FINAL  Final  Blood Culture (routine x 2)     Status: None   Collection Time: 09/16/19  8:59 AM   Specimen: BLOOD  Result Value Ref Range Status   Specimen Description   Final    BLOOD RIGHT ANTECUBITAL Performed at [place] [address]  [place], [place] [address]    Special Requests   Final    BOTTLES DRAWN AEROBIC AND ANAEROBIC Blood Culture results may not be optimal due to an excessive volume of blood received in culture bottles Performed at [place] [address] [place], [place] [address]    Culture   Final    NO GROWTH 5 DAYS Performed at [facility], [place] [address], [place], [place] [address]    Report Status 09/21/2019 FINAL  Final  Urine culture     Status: Abnormal   Collection Time: 09/16/19  8:59 AM   Specimen: In/Out Cath Urine  Result Value Ref Range Status   Specimen Description   Final    IN/OUT CATH URINE Performed at [place] [address] [place], [place] [address]    Special Requests   Final    NONE Performed at [facility], [place] [address] [place], [place] [address]    Culture (A)  Final    >=100,000 COLONIES/mL PROTEUS MIRABILIS 50,000 COLONIES/mL KLEBSIELLA PNEUMONIAE    Report Status 09/20/2019 FINAL  Final   Organism ID, Bacteria PROTEUS MIRABILIS (A)  Final   Organism ID, Bacteria KLEBSIELLA PNEUMONIAE (A)  Final  Susceptibility   Klebsiella pneumoniae - MIC*    AMPICILLIN >=32 RESISTANT Resistant     CEFAZOLIN <=4 SENSITIVE Sensitive     CEFTRIAXONE <=1 SENSITIVE Sensitive     CIPROFLOXACIN <=0.25 SENSITIVE Sensitive     GENTAMICIN <=1 SENSITIVE Sensitive     IMIPENEM <=0.25 SENSITIVE Sensitive     NITROFURANTOIN 64 INTERMEDIATE Intermediate     TRIMETH/SULFA >=320 RESISTANT Resistant     AMPICILLIN/SULBACTAM 4 SENSITIVE Sensitive     PIP/TAZO <=4 SENSITIVE Sensitive     * 50,000 COLONIES/mL KLEBSIELLA PNEUMONIAE   Proteus mirabilis - MIC*    AMPICILLIN <=2 SENSITIVE Sensitive     CEFAZOLIN <=4 SENSITIVE Sensitive     CEFTRIAXONE <=1 SENSITIVE Sensitive     CIPROFLOXACIN >=4 RESISTANT Resistant     GENTAMICIN <=1 SENSITIVE Sensitive     IMIPENEM 2 SENSITIVE Sensitive     NITROFURANTOIN 128  RESISTANT Resistant     TRIMETH/SULFA >=320 RESISTANT Resistant     AMPICILLIN/SULBACTAM <=2 SENSITIVE Sensitive     PIP/TAZO <=4 SENSITIVE Sensitive     * >=100,000 COLONIES/mL PROTEUS MIRABILIS  Respiratory Panel by RT PCR (Flu A&B, Covid) - Nasopharyngeal Swab     Status: None   Collection Time: 09/16/19  9:25 AM   Specimen: Nasopharyngeal Swab  Result Value Ref Range Status   SARS Coronavirus 2 by RT PCR NEGATIVE NEGATIVE Final    Comment: (NOTE) SARS-CoV-2 target nucleic acids are NOT DETECTED. The SARS-CoV-2 RNA is generally detectable in upper respiratoy specimens during the acute phase of infection. The lowest concentration of SARS-CoV-2 viral copies this assay can detect is 131 copies/mL. A negative result does not preclude SARS-Cov-2 infection and should not be used as the sole basis for treatment or other patient management decisions. A negative result may occur with  improper specimen collection/handling, submission of specimen other than nasopharyngeal swab, presence of viral mutation(s) within the areas targeted by this assay, and inadequate number of viral copies (<131 copies/mL). A negative result must be combined with clinical observations, patient history, and epidemiological information. The expected result is Negative. Fact Sheet for Patients:  https://www.moore.com/ Fact Sheet for Healthcare Providers:  https://www.young.biz/ This test is not yet ap proved or cleared by the [place] FDA and  has been authorized for detection and/or diagnosis of SARS-CoV-2 by FDA under an Emergency Use Authorization (EUA). This EUA will remain  in effect (meaning this test can be used) for the duration of the COVID-19 declaration under Section 564(b)(1) of the Act, 21 U.S.C. section 360bbb-3(b)(1), unless the authorization is terminated or revoked sooner.    Influenza A by PCR NEGATIVE NEGATIVE Final   Influenza B by PCR NEGATIVE  NEGATIVE Final    Comment: (NOTE) The Xpert Xpress SARS-CoV-2/FLU/RSV assay is intended as an aid in  the diagnosis of influenza from Nasopharyngeal swab specimens and  should not be used as a sole basis for treatment. Nasal washings and  aspirates are unacceptable for Xpert Xpress SARS-CoV-2/FLU/RSV  testing. Fact Sheet for Patients: https://www.moore.com/ Fact Sheet for Healthcare Providers: https://www.young.biz/ This test is not yet approved or cleared by the [place] FDA and  has been authorized for detection and/or diagnosis of SARS-CoV-2 by  FDA under an Emergency Use Authorization (EUA). This EUA will remain  in effect (meaning this test can be used) for the duration of the  Covid-19 declaration under Section 564(b)(1) of the Act, 21  U.S.C. section 360bbb-3(b)(1), unless the authorization is  terminated or revoked. Performed at [facility], [address], [place], [place] [address]   MRSA PCR Screening     Status: None   Collection Time: 09/16/19  6:59 PM   Specimen: Nasal Mucosa; Nasopharyngeal  Result Value Ref Range Status   MRSA by PCR NEGATIVE NEGATIVE Final    Comment:        The GeneXpert MRSA Assay (FDA approved for NASAL specimens only), is one component of a comprehensive MRSA colonization surveillance program. It is not intended to diagnose MRSA infection nor to guide or monitor treatment for MRSA infections. Performed at [facility], [address], [place], [place] [address]   SARS CORONAVIRUS 2 (TAT 6-24 HRS) Nasopharyngeal Nasopharyngeal Swab     Status: None   Collection Time: 09/21/19 11:20 AM   Specimen: Nasopharyngeal Swab  Result Value Ref Range Status   SARS Coronavirus 2 NEGATIVE NEGATIVE Final    Comment: (NOTE) SARS-CoV-2 target nucleic acids are NOT DETECTED. The SARS-CoV-2 RNA is generally detectable in upper and lower respiratory specimens during the  acute phase of infection. Negative results do not preclude SARS-CoV-2 infection, do not rule out co-infections with other pathogens, and should not be used as the sole basis for treatment or other patient management decisions. Negative results must be combined with clinical observations, patient history, and epidemiological information. The expected result is Negative. Fact Sheet for Patients: HairSlick.no Fact Sheet for Healthcare Providers: quierodirigir.com This test is not yet approved or cleared by the [place] FDA and  has been authorized for detection and/or diagnosis of SARS-CoV-2 by FDA under an Emergency Use Authorization (EUA). This EUA will remain  in effect (meaning this test can be used) for the duration of the COVID-19 declaration under Section 56 4(b)(1) of the Act, 21 U.S.C. section 360bbb-3(b)(1), unless the authorization is terminated or revoked sooner. Performed at [facility], [address] [place], [place] [address]   SARS CORONAVIRUS 2 (TAT 6-24 HRS) Nasopharyngeal Nasopharyngeal Swab     Status: None   Collection Time: 09/23/19  1:19 PM   Specimen: Nasopharyngeal Swab  Result Value Ref Range Status   SARS Coronavirus 2 NEGATIVE NEGATIVE Final    Comment: (NOTE) SARS-CoV-2 target nucleic acids are NOT DETECTED. The SARS-CoV-2 RNA is generally detectable in upper and lower respiratory specimens during the acute phase of infection. Negative results do not preclude SARS-CoV-2 infection, do not rule out co-infections with other pathogens, and should not be used as the sole basis for treatment or other patient management decisions. Negative results must be combined with clinical observations, patient history, and epidemiological information. The expected result is Negative. Fact Sheet for Patients: HairSlick.no Fact Sheet for Healthcare  Providers: quierodirigir.com This test is not yet approved or cleared by the [place] FDA and  has been authorized for detection and/or diagnosis of SARS-CoV-2 by FDA under an Emergency Use Authorization (EUA). This EUA will remain  in effect (meaning this test can be used) for the duration of the COVID-19 declaration under Section 56 4(b)(1) of the Act, 21 U.S.C. section 360bbb-3(b)(1), unless the authorization is terminated or revoked sooner. Performed at [facility], [address], [place], [place] [address]      Time coordinating discharge:  37 minutes  SIGNED:   Alwyn Ren, [facility]  [facility] 09/24/2019, 10:07 AM Pager   If 7PM-7AM, please contact night-coverage [email or web address] Password [email or web address]

## 2019-09-24 NOTE — Progress Notes (Signed)
I responded to page for Ad and notary signing for Pt. Janice Little was alert and talking. She was met with her sister(Attorney) who is her POA and a witness was present. I was able to get another witness for signing. Notary was present and all documents were signed. I made copies to give to Estephania and her sister. I also made copy to give to her Nurse for filing.  I was present to offer [employer] of presence and words of encouragement. Sister was thankful and appreciative for the Chaplain presence.   Chaplain Resident Fidel Levy  [phone]

## 2019-09-24 NOTE — TOC Progression Note (Addendum)
Transition of Care [facility]) - Progression Note    Patient Details  Name: Janice Little MRN: [id number] Date of Birth: 1946-09-02  Transition of [facility]) CM/SW Contact  Leeroy Cha, RN Phone Number: 09/24/2019, 9:46 AM  Clinical Narrative:    [facility] trying to in touch with the son to sign papers for transfer of patient to [facility]/tct-Chris Owens Shark son message left to please call asap. tcf-Jamie at [facility] is now wanting to be the poa and is coming to the hospital to fill out forms/family is the delay on the transfer.  Expected Discharge Plan: [place]    Expected Discharge Plan and Services Expected Discharge Plan: [place] arrangements for the past 2 months: [place]                                       Social Determinants of Health (SDOH) Interventions    Readmission Risk Interventions Readmission Risk Prevention Plan 09/18/2019 12/24/2018  Post Dischage Appt - Complete  Medication Screening - Complete  Transportation Screening Complete Complete  PCP or Specialist Appt within 5-7 Days Complete -  Home Care Screening Complete -  Medication Review (RN CM) Complete -  Some recent data might be hidden

## 2019-09-24 NOTE — Progress Notes (Signed)
Was notified by Lenore Cordia [facility], that sister was downstairs to arrange POA and transfer of patient to [facility]. I was not aware of situation and called Velva Harman, Case Manager. Raechel Chute approved for sister and chaplain to come up to sign POA papers needed to discharge. Notary notified. Velva Harman aware and coming up to floor.

## 2019-09-25 ENCOUNTER — Encounter: Payer: Medicare Other | Admitting: Vascular Surgery

## 2019-09-25 NOTE — Plan of Care (Signed)
  Problem: Clinical Measurements: Goal: Diagnostic test results will improve Outcome: Adequate for Discharge   Problem: Education: Goal: Knowledge of General Education information will improve Description: Including pain rating scale, medication(s)/side effects and non-pharmacologic comfort measures Outcome: Adequate for Discharge   Problem: Health Behavior/Discharge Planning: Goal: Ability to manage health-related needs will improve Outcome: Adequate for Discharge   Problem: Clinical Measurements: Goal: Ability to maintain clinical measurements within normal limits will improve Outcome: Adequate for Discharge Goal: Will remain free from infection Outcome: Adequate for Discharge Goal: Diagnostic test results will improve Outcome: Adequate for Discharge Goal: Respiratory complications will improve Outcome: Adequate for Discharge Goal: Cardiovascular complication will be avoided Outcome: Adequate for Discharge   Problem: Activity: Goal: Risk for activity intolerance will decrease Outcome: Adequate for Discharge   Problem: Nutrition: Goal: Adequate nutrition will be maintained Outcome: Adequate for Discharge   Problem: Coping: Goal: Level of anxiety will decrease Outcome: Adequate for Discharge   Problem: Elimination: Goal: Will not experience complications related to bowel motility Outcome: Adequate for Discharge Goal: Will not experience complications related to urinary retention Outcome: Adequate for Discharge   Problem: Pain Managment: Goal: General experience of comfort will improve Outcome: Adequate for Discharge   Problem: Safety: Goal: Ability to remain free from injury will improve Outcome: Adequate for Discharge   Problem: Skin Integrity: Goal: Risk for impaired skin integrity will decrease Outcome: Adequate for Discharge

## 2019-09-25 NOTE — Progress Notes (Signed)
Patient was picked up by [facility] for transfer to [facility]. Day shift RN called Quillian Quince and gave handoff report prior to my shift. Patient resting comfortably upon discharge.

## 2019-09-27 LAB — GLUCOSE, CAPILLARY: Glucose-Capillary: 101 mg/dL — ABNORMAL HIGH (ref 70–99)

## 2019-10-10 ENCOUNTER — Ambulatory Visit: Payer: Medicare Other | Admitting: Neurology

## 2019-10-19 NOTE — Progress Notes (Signed)
ILR remote 

## 2019-11-06 ENCOUNTER — Encounter: Payer: Self-pay | Admitting: Surgery

## 2019-11-25 ENCOUNTER — Ambulatory Visit (INDEPENDENT_AMBULATORY_CARE_PROVIDER_SITE_OTHER): Payer: Medicare Other | Admitting: *Deleted

## 2019-11-25 DIAGNOSIS — I639 Cerebral infarction, unspecified: Secondary | ICD-10-CM | POA: Diagnosis not present

## 2019-11-25 LAB — CUP PACEART REMOTE DEVICE CHECK
Date Time Interrogation Session: 20210219173257
Implantable Pulse Generator Implant Date: 20200602

## 2019-11-25 NOTE — Progress Notes (Signed)
ILR Remote 

## 2020-01-11 ENCOUNTER — Emergency Department (HOSPITAL_COMMUNITY): Payer: Medicare Other

## 2020-01-11 ENCOUNTER — Other Ambulatory Visit: Payer: Self-pay

## 2020-01-11 ENCOUNTER — Emergency Department (HOSPITAL_COMMUNITY)
Admission: EM | Admit: 2020-01-11 | Discharge: 2020-01-12 | Disposition: A | Discharge status: Skilled Nursing Facility | Payer: Medicare Other | Attending: Emergency Medicine | Admitting: Emergency Medicine

## 2020-01-11 DIAGNOSIS — Z79899 Other long term (current) drug therapy: Secondary | ICD-10-CM | POA: Insufficient documentation

## 2020-01-11 DIAGNOSIS — W19XXXA Unspecified fall, initial encounter: Secondary | ICD-10-CM | POA: Insufficient documentation

## 2020-01-11 DIAGNOSIS — Y999 Unspecified external cause status: Secondary | ICD-10-CM | POA: Diagnosis not present

## 2020-01-11 DIAGNOSIS — M79662 Pain in left lower leg: Secondary | ICD-10-CM | POA: Diagnosis present

## 2020-01-11 DIAGNOSIS — Y92122 Bedroom in nursing home as the place of occurrence of the external cause: Secondary | ICD-10-CM | POA: Insufficient documentation

## 2020-01-11 DIAGNOSIS — G35 Multiple sclerosis: Secondary | ICD-10-CM | POA: Insufficient documentation

## 2020-01-11 DIAGNOSIS — Z7984 Long term (current) use of oral hypoglycemic drugs: Secondary | ICD-10-CM | POA: Insufficient documentation

## 2020-01-11 DIAGNOSIS — Y9389 Activity, other specified: Secondary | ICD-10-CM | POA: Insufficient documentation

## 2020-01-11 DIAGNOSIS — E1140 Type 2 diabetes mellitus with diabetic neuropathy, unspecified: Secondary | ICD-10-CM | POA: Insufficient documentation

## 2020-01-11 DIAGNOSIS — I69354 Hemiplegia and hemiparesis following cerebral infarction affecting left non-dominant side: Secondary | ICD-10-CM | POA: Insufficient documentation

## 2020-01-11 NOTE — Discharge Instructions (Signed)
As discussed, your evaluation today has been largely reassuring.  But, it is important that you monitor your condition carefully, and do not hesitate to return to the ED if you develop new, or concerning changes in your condition. ? ?Otherwise, please follow-up with your physician for appropriate ongoing care. ? ?

## 2020-01-11 NOTE — ED Triage Notes (Signed)
As per EMS pt had unwitnessed fall. Pt has no visual injuries fall from bed to floor

## 2020-01-11 NOTE — ED Provider Notes (Signed)
[facility] DEPT Provider Note   CSN: [id number] Arrival date & time: 01/11/20  2014     History Chief Complaint  Patient presents with  . Fall    Janice Little is a 74 y.o. female.  HPI Patient presents from nursing facility after a fall.  Patient notes that she was found next to her bed by nursing aides , and sent here for evaluation.  She denies pain, denies new weakness, denies new confusion, denies nausea.  She states that she was in her usual state of health prior to the fall.  She does acknowledge multiple medical issues including prior stroke, and has a history of MS as well. She has baseline hemiplegia, left-sided, this is unchanged.  Past Medical History:  Diagnosis Date  . Chronic kidney disease (CKD), stage II (mild)   . Chronic pain   . DM type 2 (diabetes mellitus, type 2) ([place])   . Fatty liver    per CT  . GERD (gastroesophageal reflux disease)   . History of hepatitis as a child    age 48 (jaundice)  . HTN (hypertension)   . Hyperlipidemia   . MS (multiple sclerosis) ([place])   . Obesity   . Osteopenia   . Primary hyperparathyroidism (HCC)    s/p surgery removal of parathyroid (Dr. Buddy Duty, Dr. Harlow Asa)  . Vitamin D deficiency     Patient Active Problem List   Diagnosis Date Noted  . Gross hematuria   . Acute sepsis ([place])   . Sepsis secondary to UTI ([place])   . Diarrhea   . Elevated sed rate 08/12/2019  . Elevated C-reactive protein (CRP) 08/12/2019  . Left-sided neglect 08/01/2019  . Myalgia 08/01/2019  . Pressure injury of skin 03/04/2019  . Acute lower UTI   . Stroke ([place]) 03/02/2019  . Acute CVA (cerebrovascular accident) ([place]) 03/01/2019  . Hypertensive urgency 12/19/2018  . History of myelitis 12/26/2017  . History of optic neuritis 04/02/2015  . Diplopia 04/02/2015  . Urinary incontinence 04/02/2015  . Other fatigue 04/02/2015  . 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 Numbness  04/02/2015  . Ataxic gait 04/02/2015  . Essential tremor 04/02/2015  . PRIMARY HYPERPARATHYROIDISM 02/01/2008  . OSTEOPOROSIS 02/01/2008  . Diabetes mellitus with diabetic neuropathy ([address]) 01/31/2008  . Hyperlipidemia 01/31/2008  . Multiple sclerosis ([place]) 01/31/2008  . Optic neuritis 01/31/2008  . Essential hypertension 01/31/2008  . GERD 01/31/2008    Past Surgical History:  Procedure Laterality Date  . ANTERIOR CRUCIATE LIGAMENT REPAIR Left   . LOOP RECORDER INSERTION N/A 03/05/2019   Procedure: LOOP RECORDER INSERTION;  Surgeon: Evans Lance, MD;  Location: [facility];  Service: Cardiovascular;  Laterality: N/A;  . VESICOVAGINAL FISTULA CLOSURE W/ TAH       OB History   No obstetric history on file.     Family History  Problem Relation Age of Onset  . Mental illness Mother   . COPD Father   . Lung cancer Father     Social History   Tobacco Use  . Smoking status: Never Smoker  . Smokeless tobacco: Never Used  Substance Use Topics  . Alcohol use: No  . Drug use: No    Home Medications Prior to Admission medications   Medication Sig Start Date End Date Taking? Authorizing Provider  acetaminophen (TYLENOL) 325 MG tablet Take 2 tablets (650 mg total) by mouth every 4 (four) hours as needed for mild pain (or temp >  37.5 C (99.5 F)). 03/05/19   Vassie Loll, MD  amLODipine (NORVASC) 5 MG tablet Take 5 mg by mouth daily. 07/24/19   [provider]  aspirin 325 MG tablet Take 1 tablet (325 mg total) by mouth daily. 03/06/19   Vassie Loll, MD  DULoxetine (CYMBALTA) 60 MG capsule Take 60 mg by mouth daily.    [provider]  gabapentin (NEURONTIN) 100 MG capsule Take 100 mg by mouth 3 (three) times daily.    [provider]  hydrALAZINE (APRESOLINE) 25 MG tablet Take 3 tablets (75 mg total) by mouth every 8 (eight) hours. 03/05/19   Vassie Loll, MD  lisinopril (ZESTRIL) 30 MG tablet Take 1 tablet (30 mg total) by mouth daily. 03/06/19    Vassie Loll, MD  Melatonin 3 MG TABS Take 3 mg by mouth at bedtime.     [provider]  metFORMIN (GLUCOPHAGE-XR) 500 MG 24 hr tablet Take 1 tablet (500 mg total) by mouth 2 (two) times a day. 03/05/19   Vassie Loll, MD  Multiple Vitamins-Minerals (CEROVITE SENIOR) TABS Take 1 tablet by mouth daily.    [provider]  omega-3 fish oil (MAXEPA) 1000 MG CAPS capsule Take 1,000 mg by mouth daily.    [provider]  omeprazole (PRILOSEC OTC) 20 MG tablet Take 20 mg by mouth daily.    [provider]  polyethylene glycol powder (GLYCOLAX/MIRALAX) 17 GM/SCOOP powder Take 17 g by mouth daily as needed for mild constipation.    [provider]  predniSONE (DELTASONE) 10 MG tablet Take 3 pills po daily.  Monitor sugars daily Patient taking differently: Take 30 mg by mouth daily. Monitor sugars daily 08/12/19   Sater, Pearletha Furl, MD  sitaGLIPtin (JANUVIA) 50 MG tablet Take 50 mg by mouth daily.    [provider]    Allergies    Patient has no known allergies.  Review of Systems   Review of Systems  Constitutional:       Per HPI, otherwise negative  HENT:       Per HPI, otherwise negative  Respiratory:       Per HPI, otherwise negative  Cardiovascular:       Per HPI, otherwise negative  Gastrointestinal: Negative for vomiting.  Endocrine:       Negative aside from HPI  Genitourinary:       Neg aside from HPI   Musculoskeletal:       Per HPI, otherwise negative  Skin: Positive for rash.  Neurological: Positive for weakness. Negative for syncope.    Physical Exam Updated Vital Signs BP (!) 170/80 (BP Location: Right Arm)   Pulse (!) 103   Temp 97.9 F (36.6 C) (Oral)   Resp 18   SpO2 97%   Physical Exam Vitals and nursing note reviewed.  Constitutional:      Appearance: She is well-developed. She is ill-appearing.     Comments: Elderly F in cervical collar, awake, alert  HENT:     Head: Normocephalic and atraumatic.    Eyes:     Conjunctiva/sclera: Conjunctivae normal.  Cardiovascular:     Rate and Rhythm: Normal rate and regular rhythm.  Pulmonary:     Effort: Pulmonary effort is normal. No respiratory distress.     Breath sounds: Normal breath sounds. No stridor.  Abdominal:     General: There is no distension.     Tenderness: There is no abdominal tenderness. There is no guarding.  Skin:    General:  Skin is warm and dry.  Neurological:     Mental Status: She is alert and oriented to person, place, and time.     Cranial Nerves: No cranial nerve deficit.     Motor: Weakness and atrophy present.     Comments: Gross atrophy appreciable.  Patient has baseline left-sided hemiplegia, this is seemingly unchanged, with minimal motion of the left arm, left leg.  Right-sided strength 4/5 upper and lower.  No facial asymmetry, speech is clear, brief, appropriate.     ED Results / Procedures / Treatments    Radiology CT Head Wo Contrast  Result Date: 01/11/2020 CLINICAL DATA:  Unwitnessed fall EXAM: CT HEAD WITHOUT CONTRAST CT CERVICAL SPINE WITHOUT CONTRAST TECHNIQUE: Multidetector CT imaging of the head and cervical spine was performed following the standard protocol without intravenous contrast. Multiplanar CT image reconstructions of the cervical spine were also generated. COMPARISON:  CT head dated 03/01/2019 FINDINGS: CT HEAD FINDINGS Brain: No evidence of acute infarction, hemorrhage, hydrocephalus, extra-axial collection or mass lesion/mass effect. Encephalomalacic changes related to prior right parietal temporal infarct. Associated involvement of the right thalamus. Subcortical white matter and periventricular small vessel ischemic changes. Mild cortical atrophy. Vascular: Mild intracranial atherosclerosis. Skull: Normal. Negative for fracture or focal lesion. Sinuses/Orbits: The visualized paranasal sinuses are essentially clear. The mastoid air cells are unopacified. Other: None. CT CERVICAL SPINE  FINDINGS Alignment: Normal cervical lordosis. Skull base and vertebrae: No acute fracture. No primary bone lesion or focal pathologic process. Soft tissues and spinal canal: No prevertebral fluid or swelling. No visible canal hematoma. Disc levels: Mild degenerative changes of the mid/lower cervical spine. Spinal canal is patent. Upper chest: Visualized lung apices are clear. Other: 8 mm right thyroid nodule. Not clinically significant; no follow-up imaging recommended (ref: J Am Coll Radiol. 2015 Feb;12(2): 143-50). IMPRESSION: No evidence of acute intracranial abnormality. Encephalomalacic changes related to prior right MCA distribution infarct. Atrophy with small vessel ischemic changes. No evidence of traumatic injury to the cervical spine. Mild degenerative changes. Electronically Signed   By: Charline Bills M.D.   On: 01/11/2020 22:00   CT Cervical Spine Wo Contrast  Result Date: 01/11/2020 CLINICAL DATA:  Unwitnessed fall EXAM: CT HEAD WITHOUT CONTRAST CT CERVICAL SPINE WITHOUT CONTRAST TECHNIQUE: Multidetector CT imaging of the head and cervical spine was performed following the standard protocol without intravenous contrast. Multiplanar CT image reconstructions of the cervical spine were also generated. COMPARISON:  CT head dated 03/01/2019 FINDINGS: CT HEAD FINDINGS Brain: No evidence of acute infarction, hemorrhage, hydrocephalus, extra-axial collection or mass lesion/mass effect. Encephalomalacic changes related to prior right parietal temporal infarct. Associated involvement of the right thalamus. Subcortical white matter and periventricular small vessel ischemic changes. Mild cortical atrophy. Vascular: Mild intracranial atherosclerosis. Skull: Normal. Negative for fracture or focal lesion. Sinuses/Orbits: The visualized paranasal sinuses are essentially clear. The mastoid air cells are unopacified. Other: None. CT CERVICAL SPINE FINDINGS Alignment: Normal cervical lordosis. Skull base and  vertebrae: No acute fracture. No primary bone lesion or focal pathologic process. Soft tissues and spinal canal: No prevertebral fluid or swelling. No visible canal hematoma. Disc levels: Mild degenerative changes of the mid/lower cervical spine. Spinal canal is patent. Upper chest: Visualized lung apices are clear. Other: 8 mm right thyroid nodule. Not clinically significant; no follow-up imaging recommended (ref: J Am Coll Radiol. 2015 Feb;12(2): 143-50). IMPRESSION: No evidence of acute intracranial abnormality. Encephalomalacic changes related to prior right MCA distribution infarct. Atrophy with small vessel ischemic changes. No evidence of traumatic  injury to the cervical spine. Mild degenerative changes. Electronically Signed   By: Charline Bills M.D.   On: 01/11/2020 22:00   DG Knee Complete 4 Views Left  Result Date: 01/11/2020 CLINICAL DATA:  Unwitnessed fall EXAM: LEFT KNEE - COMPLETE 4+ VIEW COMPARISON:  None. FINDINGS: No fracture or dislocation is seen. The joint spaces are preserved.  Prior ACL repair. Visualized soft tissues are within normal limits. No suprapatellar knee joint effusion. IMPRESSION: Negative. Electronically Signed   By: Charline Bills M.D.   On: 01/11/2020 21:56   DG HIP UNILAT WITH PELVIS 2-3 VIEWS LEFT  Result Date: 01/11/2020 CLINICAL DATA:  Unwitnessed fall EXAM: DG HIP (WITH OR WITHOUT PELVIS) 2-3V LEFT COMPARISON:  None. FINDINGS: No fracture or dislocation is seen. Bilateral hip joint spaces are preserved. Visualized bony pelvis appears intact. Mild rectal stool burden. IMPRESSION: Negative. Electronically Signed   By: Charline Bills M.D.   On: 01/11/2020 21:55    Procedures Procedures (including critical care time)  Medications Ordered in ED Medications - No data to display  ED Course  I have reviewed the triage vital signs and the nursing notes.  Pertinent labs & imaging results that were available during my care of the patient were reviewed by  me and considered in my medical decision making (see chart for details).  Elderly female presents from nursing facility after fall.  Patient has baseline debilitation, and on exam has no pain, but with difficulty fully examining the patient's left lower extremity, x-rays were ordered.  Similarly, given her age, comorbidities, concern for head trauma, neck trauma, the patient CT scan, head, neck performed. Patient has no fever, no cough, no urinary complaints, no abdominal tenderness.  Initial labs considered, not indicated. Final Clinical Impression(s) / ED Diagnoses Final diagnoses:  Fall, initial encounter     Gerhard Munch, MD 01/11/20 2214

## 2020-08-05 ENCOUNTER — Encounter: Payer: Self-pay | Admitting: Neurology

## 2020-08-05 ENCOUNTER — Ambulatory Visit: Payer: Medicare Other | Admitting: Neurology

## 2020-10-16 ENCOUNTER — Encounter (HOSPITAL_COMMUNITY): Payer: Self-pay

## 2020-10-16 ENCOUNTER — Emergency Department (HOSPITAL_COMMUNITY): Payer: Medicare Other

## 2020-10-16 ENCOUNTER — Other Ambulatory Visit: Payer: Self-pay

## 2020-10-16 ENCOUNTER — Inpatient Hospital Stay (HOSPITAL_COMMUNITY)
Admission: EM | Admit: 2020-10-16 | Discharge: 2020-10-23 | DRG: 871 | Disposition: A | Discharge status: Skilled Nursing Facility | Payer: Medicare Other | Source: Ambulatory Visit | Attending: Family Medicine | Admitting: Family Medicine

## 2020-10-16 DIAGNOSIS — G35 Multiple sclerosis: Secondary | ICD-10-CM | POA: Diagnosis present

## 2020-10-16 DIAGNOSIS — I69354 Hemiplegia and hemiparesis following cerebral infarction affecting left non-dominant side: Secondary | ICD-10-CM

## 2020-10-16 DIAGNOSIS — I10 Essential (primary) hypertension: Secondary | ICD-10-CM | POA: Diagnosis present

## 2020-10-16 DIAGNOSIS — R7989 Other specified abnormal findings of blood chemistry: Secondary | ICD-10-CM

## 2020-10-16 DIAGNOSIS — R6520 Severe sepsis without septic shock: Secondary | ICD-10-CM | POA: Diagnosis present

## 2020-10-16 DIAGNOSIS — E11649 Type 2 diabetes mellitus with hypoglycemia without coma: Secondary | ICD-10-CM

## 2020-10-16 DIAGNOSIS — J1282 Pneumonia due to coronavirus disease 2019: Secondary | ICD-10-CM | POA: Diagnosis present

## 2020-10-16 DIAGNOSIS — J9601 Acute respiratory failure with hypoxia: Secondary | ICD-10-CM | POA: Diagnosis present

## 2020-10-16 DIAGNOSIS — K8010 Calculus of gallbladder with chronic cholecystitis without obstruction: Secondary | ICD-10-CM | POA: Diagnosis present

## 2020-10-16 DIAGNOSIS — I129 Hypertensive chronic kidney disease with stage 1 through stage 4 chronic kidney disease, or unspecified chronic kidney disease: Secondary | ICD-10-CM | POA: Diagnosis present

## 2020-10-16 DIAGNOSIS — E1165 Type 2 diabetes mellitus with hyperglycemia: Secondary | ICD-10-CM | POA: Diagnosis not present

## 2020-10-16 DIAGNOSIS — Z7401 Bed confinement status: Secondary | ICD-10-CM | POA: Diagnosis not present

## 2020-10-16 DIAGNOSIS — J9621 Acute and chronic respiratory failure with hypoxia: Secondary | ICD-10-CM | POA: Diagnosis present

## 2020-10-16 DIAGNOSIS — K810 Acute cholecystitis: Secondary | ICD-10-CM | POA: Diagnosis not present

## 2020-10-16 DIAGNOSIS — Z515 Encounter for palliative care: Secondary | ICD-10-CM

## 2020-10-16 DIAGNOSIS — A4189 Other specified sepsis: Secondary | ICD-10-CM | POA: Diagnosis present

## 2020-10-16 DIAGNOSIS — E210 Primary hyperparathyroidism: Secondary | ICD-10-CM | POA: Diagnosis present

## 2020-10-16 DIAGNOSIS — U071 COVID-19: Secondary | ICD-10-CM | POA: Diagnosis present

## 2020-10-16 DIAGNOSIS — J690 Pneumonitis due to inhalation of food and vomit: Secondary | ICD-10-CM | POA: Diagnosis present

## 2020-10-16 DIAGNOSIS — E1122 Type 2 diabetes mellitus with diabetic chronic kidney disease: Secondary | ICD-10-CM | POA: Diagnosis present

## 2020-10-16 DIAGNOSIS — E559 Vitamin D deficiency, unspecified: Secondary | ICD-10-CM | POA: Diagnosis present

## 2020-10-16 DIAGNOSIS — Z7952 Long term (current) use of systemic steroids: Secondary | ICD-10-CM

## 2020-10-16 DIAGNOSIS — E162 Hypoglycemia, unspecified: Secondary | ICD-10-CM

## 2020-10-16 DIAGNOSIS — E870 Hyperosmolality and hypernatremia: Secondary | ICD-10-CM | POA: Diagnosis not present

## 2020-10-16 DIAGNOSIS — N182 Chronic kidney disease, stage 2 (mild): Secondary | ICD-10-CM | POA: Diagnosis present

## 2020-10-16 DIAGNOSIS — Z794 Long term (current) use of insulin: Secondary | ICD-10-CM | POA: Diagnosis not present

## 2020-10-16 DIAGNOSIS — E213 Hyperparathyroidism, unspecified: Secondary | ICD-10-CM | POA: Diagnosis present

## 2020-10-16 DIAGNOSIS — E785 Hyperlipidemia, unspecified: Secondary | ICD-10-CM | POA: Diagnosis present

## 2020-10-16 DIAGNOSIS — R4701 Aphasia: Secondary | ICD-10-CM | POA: Diagnosis not present

## 2020-10-16 DIAGNOSIS — G9341 Metabolic encephalopathy: Secondary | ICD-10-CM | POA: Diagnosis present

## 2020-10-16 DIAGNOSIS — T380X5A Adverse effect of glucocorticoids and synthetic analogues, initial encounter: Secondary | ICD-10-CM | POA: Diagnosis not present

## 2020-10-16 DIAGNOSIS — I69319 Unspecified symptoms and signs involving cognitive functions following cerebral infarction: Secondary | ICD-10-CM

## 2020-10-16 DIAGNOSIS — E876 Hypokalemia: Secondary | ICD-10-CM | POA: Diagnosis not present

## 2020-10-16 DIAGNOSIS — A419 Sepsis, unspecified organism: Secondary | ICD-10-CM | POA: Diagnosis not present

## 2020-10-16 DIAGNOSIS — M8580 Other specified disorders of bone density and structure, unspecified site: Secondary | ICD-10-CM | POA: Diagnosis present

## 2020-10-16 DIAGNOSIS — K219 Gastro-esophageal reflux disease without esophagitis: Secondary | ICD-10-CM | POA: Diagnosis present

## 2020-10-16 DIAGNOSIS — R7982 Elevated C-reactive protein (CRP): Secondary | ICD-10-CM | POA: Diagnosis present

## 2020-10-16 DIAGNOSIS — R945 Abnormal results of liver function studies: Secondary | ICD-10-CM

## 2020-10-16 DIAGNOSIS — Z7984 Long term (current) use of oral hypoglycemic drugs: Secondary | ICD-10-CM

## 2020-10-16 DIAGNOSIS — Z801 Family history of malignant neoplasm of trachea, bronchus and lung: Secondary | ICD-10-CM

## 2020-10-16 DIAGNOSIS — Z7982 Long term (current) use of aspirin: Secondary | ICD-10-CM

## 2020-10-16 DIAGNOSIS — Z22322 Carrier or suspected carrier of Methicillin resistant Staphylococcus aureus: Secondary | ICD-10-CM

## 2020-10-16 DIAGNOSIS — R531 Weakness: Secondary | ICD-10-CM | POA: Diagnosis not present

## 2020-10-16 DIAGNOSIS — Z825 Family history of asthma and other chronic lower respiratory diseases: Secondary | ICD-10-CM

## 2020-10-16 DIAGNOSIS — Z79899 Other long term (current) drug therapy: Secondary | ICD-10-CM

## 2020-10-16 DIAGNOSIS — Z7189 Other specified counseling: Secondary | ICD-10-CM | POA: Diagnosis not present

## 2020-10-16 LAB — CBC WITH DIFFERENTIAL/PLATELET
Abs Immature Granulocytes: 0.51 10*3/uL — ABNORMAL HIGH (ref 0.00–0.07)
Basophils Absolute: 0 10*3/uL (ref 0.0–0.1)
Basophils Relative: 0 %
Eosinophils Absolute: 0 10*3/uL (ref 0.0–0.5)
Eosinophils Relative: 0 %
HCT: 33 % — ABNORMAL LOW (ref 36.0–46.0)
Hemoglobin: 10.3 g/dL — ABNORMAL LOW (ref 12.0–15.0)
Immature Granulocytes: 3 %
Lymphocytes Relative: 5 %
Lymphs Abs: 0.8 10*3/uL (ref 0.7–4.0)
MCH: 27.5 pg (ref 26.0–34.0)
MCHC: 31.2 g/dL (ref 30.0–36.0)
MCV: 88 fL (ref 80.0–100.0)
Monocytes Absolute: 0.1 10*3/uL (ref 0.1–1.0)
Monocytes Relative: 1 %
Neutro Abs: 13.4 10*3/uL — ABNORMAL HIGH (ref 1.7–7.7)
Neutrophils Relative %: 91 %
Platelets: 234 10*3/uL (ref 150–400)
RBC: 3.75 MIL/uL — ABNORMAL LOW (ref 3.87–5.11)
RDW: 16.1 % — ABNORMAL HIGH (ref 11.5–15.5)
WBC: 14.8 10*3/uL — ABNORMAL HIGH (ref 4.0–10.5)
nRBC: 0.2 % (ref 0.0–0.2)

## 2020-10-16 LAB — COMPREHENSIVE METABOLIC PANEL
ALT: 36 U/L (ref 0–44)
AST: 38 U/L (ref 15–41)
Albumin: 2.3 g/dL — ABNORMAL LOW (ref 3.5–5.0)
Alkaline Phosphatase: 107 U/L (ref 38–126)
Anion gap: 12 (ref 5–15)
BUN: 44 mg/dL — ABNORMAL HIGH (ref 8–23)
CO2: 27 mmol/L (ref 22–32)
Calcium: 8.7 mg/dL — ABNORMAL LOW (ref 8.9–10.3)
Chloride: 104 mmol/L (ref 98–111)
Creatinine, Ser: 0.71 mg/dL (ref 0.44–1.00)
GFR, Estimated: >60 mL/min (ref >60)
Glucose, Bld: 53 mg/dL — ABNORMAL LOW (ref 70–99)
Potassium: 3.7 mmol/L (ref 3.5–5.1)
Sodium: 143 mmol/L (ref 135–145)
Total Bilirubin: 0.2 mg/dL — ABNORMAL LOW (ref 0.3–1.2)
Total Protein: 5.7 g/dL — ABNORMAL LOW (ref 6.5–8.1)

## 2020-10-16 LAB — FERRITIN: Ferritin: 346 ng/mL — ABNORMAL HIGH (ref 11–307)

## 2020-10-16 LAB — URINALYSIS, ROUTINE W REFLEX MICROSCOPIC
Bilirubin Urine: NEGATIVE
Glucose, UA: NEGATIVE mg/dL
Hgb urine dipstick: NEGATIVE
Ketones, ur: NEGATIVE mg/dL
Leukocytes,Ua: NEGATIVE
Nitrite: NEGATIVE
Protein, ur: 30 mg/dL — AB
Specific Gravity, Urine: 1.021 (ref 1.005–1.030)
pH: 5 (ref 5.0–8.0)

## 2020-10-16 LAB — MRSA PCR SCREENING: MRSA by PCR: POSITIVE — AB

## 2020-10-16 LAB — TRIGLYCERIDES: Triglycerides: 116 mg/dL (ref <150)

## 2020-10-16 LAB — FIBRINOGEN: Fibrinogen: >800 mg/dL — ABNORMAL HIGH (ref 210–475)

## 2020-10-16 LAB — PROCALCITONIN: Procalcitonin: 0.83 ng/mL

## 2020-10-16 LAB — D-DIMER, QUANTITATIVE: D-Dimer, Quant: 2.62 ug/mL-FEU — ABNORMAL HIGH (ref 0.00–0.50)

## 2020-10-16 LAB — C-REACTIVE PROTEIN: CRP: 34.9 mg/dL — ABNORMAL HIGH (ref <1.0)

## 2020-10-16 LAB — LACTIC ACID, PLASMA: Lactic Acid, Venous: 1.2 mmol/L (ref 0.5–1.9)

## 2020-10-16 LAB — LACTATE DEHYDROGENASE: LDH: 329 U/L — ABNORMAL HIGH (ref 98–192)

## 2020-10-16 MED ORDER — IPRATROPIUM-ALBUTEROL 20-100 MCG/ACT IN AERS
1.0000 | INHALATION_SPRAY | Freq: Four times a day (QID) | RESPIRATORY_TRACT | Status: DC
  Administered 2020-10-16 – 2020-10-22 (×19): 1 via RESPIRATORY_TRACT
  Filled 2020-10-16: qty 4

## 2020-10-16 MED ORDER — ACETAMINOPHEN 325 MG PO TABS: 650.0000 mg | ORAL_TABLET | Freq: Four times a day (QID) | ORAL | Status: DC | PRN

## 2020-10-16 MED ORDER — METOPROLOL TARTRATE 5 MG/5ML IV SOLN: 2.5000 mg | Freq: Four times a day (QID) | INTRAVENOUS | Status: DC | PRN

## 2020-10-16 MED ORDER — DEXAMETHASONE SODIUM PHOSPHATE 10 MG/ML IJ SOLN
6.0000 mg | Freq: Once | INTRAMUSCULAR | Status: AC
  Administered 2020-10-16: 6 mg via INTRAVENOUS
  Filled 2020-10-16: qty 1

## 2020-10-16 MED ORDER — SODIUM CHLORIDE 0.9 % IV SOLN: 250.0000 mL | INTRAVENOUS | Status: DC | PRN

## 2020-10-16 MED ORDER — LIP MEDEX EX OINT
1.0000 "application " | TOPICAL_OINTMENT | CUTANEOUS | Status: DC | PRN
  Administered 2020-10-17: 1 via TOPICAL
  Filled 2020-10-16 (×2): qty 7

## 2020-10-16 MED ORDER — SODIUM CHLORIDE 0.9% FLUSH: 3.0000 mL | INTRAVENOUS | Status: DC | PRN

## 2020-10-16 MED ORDER — GUAIFENESIN-DM 100-10 MG/5ML PO SYRP
10.0000 mL | ORAL_SOLUTION | ORAL | Status: DC | PRN
  Administered 2020-10-18 – 2020-10-19 (×2): 10 mL via ORAL
  Filled 2020-10-16 (×2): qty 10

## 2020-10-16 MED ORDER — SODIUM CHLORIDE 0.9 % IV SOLN
100.0000 mg | Freq: Every day | INTRAVENOUS | Status: AC
  Administered 2020-10-17 – 2020-10-20 (×4): 100 mg via INTRAVENOUS
  Filled 2020-10-16 (×4): qty 20

## 2020-10-16 MED ORDER — PREDNISONE 50 MG PO TABS: 50.0000 mg | ORAL_TABLET | Freq: Every day | ORAL | Status: DC

## 2020-10-16 MED ORDER — SODIUM CHLORIDE 0.9 % IV SOLN
200.0000 mg | Freq: Once | INTRAVENOUS | Status: AC
  Administered 2020-10-16: 200 mg via INTRAVENOUS
  Filled 2020-10-16: qty 200

## 2020-10-16 MED ORDER — METHYLPREDNISOLONE SODIUM SUCC 40 MG IJ SOLR
0.5000 mg/kg | Freq: Two times a day (BID) | INTRAMUSCULAR | Status: DC
  Administered 2020-10-17: 35.2 mg via INTRAVENOUS
  Filled 2020-10-16: qty 1

## 2020-10-16 MED ORDER — SODIUM CHLORIDE 0.9 % IV SOLN: 1.0000 g | INTRAVENOUS | Status: DC

## 2020-10-16 MED ORDER — DULOXETINE HCL 60 MG PO CPEP
60.0000 mg | ORAL_CAPSULE | Freq: Every day | ORAL | Status: DC
  Administered 2020-10-17 – 2020-10-23 (×7): 60 mg via ORAL
  Filled 2020-10-16 (×7): qty 1

## 2020-10-16 MED ORDER — LABETALOL HCL 5 MG/ML IV SOLN
5.0000 mg | INTRAVENOUS | Status: DC | PRN
  Filled 2020-10-16: qty 4

## 2020-10-16 MED ORDER — SODIUM CHLORIDE 0.9 % IV SOLN: 500.0000 mg | INTRAVENOUS | Status: DC

## 2020-10-16 MED ORDER — AMLODIPINE BESYLATE 5 MG PO TABS
5.0000 mg | ORAL_TABLET | Freq: Every day | ORAL | Status: DC
  Administered 2020-10-17 – 2020-10-23 (×7): 5 mg via ORAL
  Filled 2020-10-16 (×7): qty 1

## 2020-10-16 MED ORDER — ENOXAPARIN SODIUM 40 MG/0.4ML ~~LOC~~ SOLN
40.0000 mg | SUBCUTANEOUS | Status: DC
  Administered 2020-10-16 – 2020-10-20 (×5): 40 mg via SUBCUTANEOUS
  Filled 2020-10-16 (×5): qty 0.4

## 2020-10-16 MED ORDER — SODIUM CHLORIDE 0.9% FLUSH
3.0000 mL | Freq: Two times a day (BID) | INTRAVENOUS | Status: DC
  Administered 2020-10-16 – 2020-10-23 (×15): 3 mL via INTRAVENOUS

## 2020-10-16 MED ORDER — MUPIROCIN 2 % EX OINT
1.0000 "application " | TOPICAL_OINTMENT | Freq: Two times a day (BID) | CUTANEOUS | Status: AC
  Administered 2020-10-17 – 2020-10-21 (×10): 1 via NASAL
  Filled 2020-10-16: qty 22

## 2020-10-16 MED ORDER — DEXTROSE 5 % IV SOLN: Freq: Once | INTRAVENOUS | Status: AC

## 2020-10-16 MED ORDER — CHLORHEXIDINE GLUCONATE CLOTH 2 % EX PADS
6.0000 | MEDICATED_PAD | Freq: Every day | CUTANEOUS | Status: AC
  Administered 2020-10-17 – 2020-10-21 (×5): 6 via TOPICAL

## 2020-10-16 MED ORDER — ASPIRIN 325 MG PO TABS
325.0000 mg | ORAL_TABLET | Freq: Every day | ORAL | Status: DC
  Administered 2020-10-17 – 2020-10-23 (×7): 325 mg via ORAL
  Filled 2020-10-16 (×8): qty 1

## 2020-10-16 NOTE — ED Notes (Signed)
Called [facility] and asked them to fax the patient's covid result ASAP.

## 2020-10-16 NOTE — H&P (Signed)
History and Physical        Hospital Admission Note Date: 10/16/2020  Patient name: Janice Little Medical record number: [id number] Date of birth: 1946/05/02 Age: 75 y.o. Gender: female  PCP: Juluis Rainier, MD  Patient coming from: [facility]  At baseline, ambulates: bedbound  Chief Complaint    Chief Complaint  Patient presents with  . Altered Mental Status  . Weakness      HPI:    Patient is a poor historian.  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  Patient was diagnosed with COVID-19 4 days ago and has been having increased weakness and confusion.  Patient is bedbound at baseline.  Patient has been coughing but unsure if any fever.  Patient denies any specific complaints currently.  ED Course: Afebrile, tachycardic, tachypneic,  hemodynamically stable, hypoxic (SpO2 77% on room air) placed on 4-5 L/min. Notable Labs: Sodium 143, K3.7, glucose 53, BUN 44, creatinine 0.71, LDH 329, triglycerides 116, ferritin 346, CRP 35, lactic acid 1.2, procalcitonin 0.83, WBC 14.8, Hb 10.3, platelets 234, D-dimer 2.62, fibrinogen >800. Notable Imaging: CXR-patchy airspace opacity in mid to lower lung fields bilaterally. Patient received dexamethasone, remdesivir, dextrose 5%.    Vitals:   10/16/20 1645 10/16/20 1700  BP: 137/71 139/70  Pulse: (!) 103 (!) 103  Resp: 18 18  Temp:    SpO2: 94% 93%     Review of Systems:  Review of Systems  All other systems reviewed and are negative.   Medical/Social/Family History   Past  Medical History: Past Medical History:  Diagnosis Date  . Chronic kidney disease (CKD), stage II (mild)   . Chronic pain   . DM type 2 (diabetes mellitus, type 2) (HCC)   . Fatty liver    per CT  . GERD (gastroesophageal reflux disease)   . History of hepatitis as a child    age 1 (jaundice)  . HTN (hypertension)   . Hyperlipidemia   . MS (multiple sclerosis) (H[facility])   . Obesity   . Osteopenia   . Primary hyperparathyroidism (HCC)    s/p surgery removal of parathyroid (Dr. Sharl Ma, Dr. Gerrit Friends)  . Vitamin D deficiency     Past Surgical History:  Procedure Laterality Date  . ANTERIOR CRUCIATE LIGAMENT REPAIR Left   . LOOP RECORDER INSERTION N/A 03/05/2019   Procedure: LOOP RECORDER INSERTION;  Surgeon: Marinus Maw, MD;  Location: [facility] INVASIVE [facility] LAB;  Service: Cardiovascular;  Laterality: N/A;  . VESICOVAGINAL FISTULA CLOSURE W/ TAH      Medications: Prior to Admission medications   Medication Sig Start Date End Date Taking? Authorizing Provider  acetaminophen (TYLENOL) 325 MG tablet Take 2 tablets (650 mg total) by mouth every 4 (four) hours as needed for mild pain (or temp > 37.5 C (99.5 F)). 03/05/19  Yes Vassie Loll, MD  amLODipine (NORVASC) 5 MG tablet Take 5 mg by mouth daily. 07/24/19  Yes [provider]  aspirin 325 MG tablet Take 1 tablet (325 mg total) by mouth daily. 03/06/19  Yes Vassie Loll, MD  Cholecalciferol (VITAMIN D) 50 MCG (2000 UT) CAPS Take 2,000 Units by mouth daily.   Yes [provider]  DULoxetine (CYMBALTA) 60 MG capsule Take 60 mg by mouth daily.   Yes [provider]  gabapentin (NEURONTIN) 100 MG capsule Take 100 mg by mouth 3 (three) times daily.   Yes [provider]  hydrALAZINE (APRESOLINE) 25 MG tablet Take 3 tablets (75 mg total) by mouth every 8 (eight) hours. 03/05/19  Yes Vassie Loll, MD  Infant Care Products [facility]) OINT Apply 1 application topically daily. Apply to buttocks.   Yes [provider]  insulin glargine (LANTUS) 100 UNIT/ML injection Inject 34 Units into the skin at bedtime.   Yes [provider]  insulin lispro (HUMALOG) 100 UNIT/ML injection Inject 2-11 Units into the skin 3 (three) times daily before meals. Sliding scale: 101-150=2 U 151-200=3 U 201-250=5 U 251-300=7 U 301-350=9 U >350=11 U  Call MD for BS>400 or <60   Yes [provider]  lisinopril (ZESTRIL) 40 MG tablet Take 40 mg by mouth daily.   Yes [provider]  Melatonin 3 MG TABS Take 3 mg by mouth at bedtime.    Yes [provider]  metFORMIN (GLUCOPHAGE-XR) 500 MG 24 hr tablet Take 1 tablet (500 mg total) by mouth 2 (two) times a day. 03/05/19  Yes Vassie Loll, MD  Multiple Vitamins-Minerals (CEROVITE SENIOR) TABS Take 1 tablet by mouth daily.   Yes [provider]  omeprazole (PRILOSEC OTC) 20 MG tablet Take 20 mg by mouth daily.   Yes [provider]  polyethylene glycol powder (GLYCOLAX/MIRALAX) 17 GM/SCOOP powder Take 17 g by mouth daily as needed for mild constipation.   Yes [provider]  predniSONE (DELTASONE) 10 MG tablet Take 3 pills po daily.  Monitor sugars daily Patient taking differently: Take 30 mg by mouth daily. 08/12/19  Yes Sater, Pearletha Furl, MD  lisinopril (ZESTRIL) 30 MG tablet Take 1 tablet (30 mg total) by mouth daily. Patient not taking: Reported on 10/16/2020 03/06/19   Vassie Loll, MD    Allergies:  No Known Allergies  Social History:  reports that she has never smoked. She has never used smokeless tobacco. She reports that she does not drink alcohol and does not use drugs.  Family History: Family History  Problem Relation Age of Onset  . Mental illness Mother   . COPD Father   . Lung cancer Father      Objective   Physical Exam: Blood pressure 139/70, pulse (!) 103, temperature 98.8 F (37.1 C), temperature source Oral, resp. rate 18, height 5\' 5"  (1.651 m), SpO2 93 %.  Physical  Exam Vitals and nursing note reviewed.  Constitutional:      General: She is not in acute distress. HENT:     Head: Normocephalic.  Eyes:     Conjunctiva/sclera: Conjunctivae normal.  Cardiovascular:     Rate and Rhythm: Regular rhythm. Tachycardia present.  Pulmonary:     Effort: Pulmonary effort is normal.     Breath sounds: No wheezing.     Comments: Audible secretions Abdominal:     General: Abdomen is flat. There is no distension.  Musculoskeletal:        General: No swelling or tenderness.     Comments: Diffuse ecchymoses  Neurological:     Mental Status: She is alert.  Comments: Oriented x1 to place  Psychiatric:        Mood and Affect: Mood normal.     LABS on Admission: I have personally reviewed all the labs and imaging below    Basic Metabolic Panel: Recent Labs  Lab 10/16/20 1525  NA 143  K 3.7  CL 104  CO2 27  GLUCOSE 53*  BUN 44*  CREATININE 0.71  CALCIUM 8.7*   Liver Function Tests: Recent Labs  Lab 10/16/20 1525  AST 38  ALT 36  ALKPHOS 107  BILITOT 0.2*  PROT 5.7*  ALBUMIN 2.3*   No results for input(s): LIPASE, AMYLASE in the last 168 hours. No results for input(s): AMMONIA in the last 168 hours. CBC: Recent Labs  Lab 10/16/20 1525  WBC 14.8*  NEUTROABS 13.4*  HGB 10.3*  HCT 33.0*  MCV 88.0  PLT 234   Cardiac Enzymes: No results for input(s): CKTOTAL, CKMB, CKMBINDEX, TROPONINI in the last 168 hours. BNP: Invalid input(s): POCBNP CBG: No results for input(s): GLUCAP in the last 168 hours.  Radiological Exams on Admission:  DG Chest Port 1 View  Result Date: 10/16/2020 CLINICAL DATA:  Weakness, COVID-19 positive EXAM: PORTABLE CHEST 1 VIEW COMPARISON:  09/06/2019 FINDINGS: Heart size within normal limits. Loop recorder device. Patchy airspace opacities within the mid to lower lung fields bilaterally. No pleural effusion or pneumothorax. IMPRESSION: Patchy airspace opacities within the mid to lower lung fields bilaterally  compatible with multifocal atypical/viral pneumonia. Electronically Signed   By: Duanne Guess D.O.   On: 10/16/2020 15:39      EKG: sinus tachycardia   A & P   Active Problems:   Type 2 diabetes mellitus with hypoglycemia without coma (HCC)   Essential hypertension   Elevated C-reactive protein (CRP)   Acute sepsis (HCC)   Acute hypoxemic respiratory failure due to COVID-19 [facility])   Acute metabolic encephalopathy   1. Sepsis without septic shock   Acute hypoxic respiratory failure secondary to COVID-19 and suspected aspiration pneumonia a. Sepsis criteria: Tachycardia, tachypnea, leukocytosis with suspected infection b. Tolerates room air at baseline c. Procalcitonin 0.8, WBC 15 and audible secretions with history of stroke is concerning for aspiration -> ceftriaxone and azithromycin d. CRP 35, hold off on baricitinib given suspected bacterial infection e. Respiratory Therapy consult f. SLP eval g. Start remdesivir h. Start steroids i. As needed inhalers j. Palliative care consult  2. Hypoglycemia in the setting of type 2 diabetes a. Hold insulin for now, monitor for hyperglycemia after getting dextrose and steroids  3. Hypertension a. SLP eval to evaluate for p.o. intake b. As needed labetalol c. Continue home meds if tolerating p.o. intake  4. Acute metabolic encephalopathy, secondary to hypoxia, hypoglycemia and sepsis a. Unclear what her baseline is b. Treatments as above  5. 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 Bedbound at baseline b. Palliative care consult c. Continue home aspirin when tolerating p.o.    DVT prophylaxis: Lovenox   Code Status: Partial Code  Diet: N.p.o. Family Communication: Admission, patients condition and plan of care including tests being ordered have been discussed with the patient who indicates understanding and agrees with the plan and Code Status. Patient's son and sister was updated   Disposition Plan: The appropriate patient status for this patient is INPATIENT. Inpatient status is judged to be reasonable and necessary in order to provide the required intensity of service to ensure the patient's safety. The patient's presenting symptoms, physical exam findings,  and initial radiographic and laboratory data in the context of their chronic comorbidities is felt to place them at high risk for further clinical deterioration. Furthermore, it is not anticipated that the patient will be medically stable for discharge from the hospital within 2 midnights of admission. The following factors support the patient status of inpatient.   " The patient's presenting symptoms include hypoxia and change in mental status. " The worrisome physical exam findings include hypoxia. " The initial radiographic and laboratory data are worrisome because of creatinine, significantly elevated inflammatory markers. " The chronic co-morbidities include cognitive deficit from stroke and left hemiplegia.   * I certify that at the point of admission it is my clinical judgment that the patient will require inpatient hospital care spanning beyond 2 midnights from the point of admission due to high intensity of service, high risk for further deterioration and high frequency of surveillance required.*   Status is: Inpatient  Remains inpatient appropriate because:Altered mental status, IV treatments appropriate due to intensity of illness or inability to take PO and Inpatient level of care appropriate due to severity of illness   Dispo: The patient is from: SNF              Anticipated d/c is to: TBD              Anticipated d/c date is: > 3 days              Patient currently is not medically stable to d/c.         The medical decision making on this patient was of high complexity and the patient is at high risk for clinical deterioration, therefore this is a level 3 admission.  Consultants   . Palliative care  Procedures  . None  Time Spent on Admission: 72 minutes    Jae Dire, DO [facility]  10/16/2020, 5:57 PM

## 2020-10-16 NOTE — ED Provider Notes (Signed)
[facility]-EMERGENCY DEPT Provider Note   CSN: [id number] Arrival date & time: 10/16/20  1425     History Chief Complaint  Patient presents with  . Altered Mental Status  . Weakness    Janice Little is a 75 y.o. female.  HPI   Patient is a resident of a nursing facility.  According to the nursing home staff the patient was diagnosed with COVID 4 days ago.  She was having increasing weakness and confusion.  At baseline patient is bedbound.  She was found to have an oxygen saturation of 77% today on room air.  Patient admits to having a cough.  She is unsure if she has had any fever.  She is denying any complaints of pain.  Past Medical History:  Diagnosis Date  . Chronic kidney disease (CKD), stage II (mild)   . Chronic pain   . DM type 2 (diabetes mellitus, type 2) (HCC)   . Fatty liver    per CT  . GERD (gastroesophageal reflux disease)   . History of hepatitis as a child    age 89 (jaundice)  . HTN (hypertension)   . Hyperlipidemia   . MS (multiple sclerosis) (HCC)   . Obesity   . Osteopenia   . Primary hyperparathyroidism (HCC)    s/p surgery removal of parathyroid (Dr. Sharl MaKerr, Dr. Gerrit FriendsGerkin)  . Vitamin D deficiency     Patient Active Problem List   Diagnosis Date Noted  . Gross hematuria   . Acute sepsis (HCC)   . Sepsis secondary to UTI (HCC)   . Diarrhea   . Elevated sed rate 08/12/2019  . Elevated C-reactive protein (CRP) 08/12/2019  . Left-sided neglect 08/01/2019  . Myalgia 08/01/2019  . Pressure injury of skin 03/04/2019  . Acute lower UTI   . Stroke (HCC) 03/02/2019  . Acute CVA (cerebrovascular accident) (HCC) 03/01/2019  . Hypertensive urgency 12/19/2018  . History of myelitis 12/26/2017  . History of optic neuritis 04/02/2015  . Diplopia 04/02/2015  . Urinary incontinence 04/02/2015  . Other fatigue 04/02/2015  . 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 Numbness 04/02/2015  . Ataxic gait  04/02/2015  . Essential tremor 04/02/2015  . PRIMARY HYPERPARATHYROIDISM 02/01/2008  . OSTEOPOROSIS 02/01/2008  . Diabetes mellitus with diabetic neuropathy (HCC) 01/31/2008  . Hyperlipidemia 01/31/2008  . Multiple sclerosis (HCC) 01/31/2008  . Optic neuritis 01/31/2008  . Essential hypertension 01/31/2008  . GERD 01/31/2008    Past Surgical History:  Procedure Laterality Date  . ANTERIOR CRUCIATE LIGAMENT REPAIR Left   . LOOP RECORDER INSERTION N/A 03/05/2019   Procedure: LOOP RECORDER INSERTION;  Surgeon: Marinus Mawaylor, Gregg W, MD;  [facility];  Service: Cardiovascular;  Laterality: N/A;  . VESICOVAGINAL FISTULA CLOSURE W/ TAH       OB History   No obstetric history on file.     Family History  Problem Relation Age of Onset  . Mental illness Mother   . COPD Father   . Lung cancer Father     Social History   Tobacco Use  . Smoking status: Never Smoker  . Smokeless tobacco: Never Used  Substance Use Topics  . Alcohol use: No  . Drug use: No    Home Medications Prior to Admission medications   Medication Sig Start Date End Date Taking? Authorizing Provider  acetaminophen (TYLENOL) 325 MG tablet Take 2 tablets (650 mg total) by mouth every 4 (four) hours as needed for mild pain (  or temp > 37.5 C (99.5 F)). 03/05/19  Yes Vassie Loll, MD  amLODipine (NORVASC) 5 MG tablet Take 5 mg by mouth daily. 07/24/19  Yes [provider]  aspirin 325 MG tablet Take 1 tablet (325 mg total) by mouth daily. 03/06/19  Yes Vassie Loll, MD  Cholecalciferol (VITAMIN D) 50 MCG (2000 UT) CAPS Take 2,000 Units by mouth daily.   Yes [provider]  DULoxetine (CYMBALTA) 60 MG capsule Take 60 mg by mouth daily.   Yes [provider]  gabapentin (NEURONTIN) 100 MG capsule Take 100 mg by mouth 3 (three) times daily.   Yes [provider]  hydrALAZINE (APRESOLINE) 25 MG tablet Take 3 tablets (75 mg total) by mouth every 8 (eight) hours. 03/05/19  Yes  Vassie Loll, MD  Infant Care Products [facility]) OINT Apply 1 application topically daily. Apply to buttocks.   Yes [provider]  insulin glargine (LANTUS) 100 UNIT/ML injection Inject 34 Units into the skin at bedtime.   Yes [provider]  insulin lispro (HUMALOG) 100 UNIT/ML injection Inject 2-11 Units into the skin 3 (three) times daily before meals. Sliding scale: 101-150=2 U 151-200=3 U 201-250=5 U 251-300=7 U 301-350=9 U >350=11 U  Call MD for BS>400 or <60   Yes [provider]  lisinopril (ZESTRIL) 40 MG tablet Take 40 mg by mouth daily.   Yes [provider]  Melatonin 3 MG TABS Take 3 mg by mouth at bedtime.    Yes [provider]  metFORMIN (GLUCOPHAGE-XR) 500 MG 24 hr tablet Take 1 tablet (500 mg total) by mouth 2 (two) times a day. 03/05/19  Yes Vassie Loll, MD  Multiple Vitamins-Minerals (CEROVITE SENIOR) TABS Take 1 tablet by mouth daily.   Yes [provider]  omeprazole (PRILOSEC OTC) 20 MG tablet Take 20 mg by mouth daily.   Yes [provider]  polyethylene glycol powder (GLYCOLAX/MIRALAX) 17 GM/SCOOP powder Take 17 g by mouth daily as needed for mild constipation.   Yes [provider]  predniSONE (DELTASONE) 10 MG tablet Take 3 pills po daily.  Monitor sugars daily Patient taking differently: Take 30 mg by mouth daily. 08/12/19  Yes Sater, Pearletha Furl, MD  lisinopril (ZESTRIL) 30 MG tablet Take 1 tablet (30 mg total) by mouth daily. Patient not taking: Reported on 10/16/2020 03/06/19   Vassie Loll, MD    Allergies    Patient has no known allergies.  Review of Systems   Review of Systems  All other systems reviewed and are negative.   Physical Exam Updated Vital Signs BP 139/70   Pulse (!) 103   Temp 98.8 F (37.1 C) (Oral)   Resp 18   Ht 1.651 m (5\' 5" )   SpO2 93%   BMI 23.99 kg/m   Physical Exam Vitals and nursing note reviewed.  Constitutional:      Appearance: She is  well-developed and well-nourished. She is ill-appearing. She is not diaphoretic.  HENT:     Head: Normocephalic and atraumatic.     Right Ear: External ear normal.     Left Ear: External ear normal.  Eyes:     General: No scleral icterus.       Right eye: No discharge.        Left eye: No discharge.     Conjunctiva/sclera: Conjunctivae normal.  Neck:     Trachea: No tracheal deviation.  Cardiovascular:     Rate and Rhythm: Normal rate and regular rhythm.  Pulses: Intact distal pulses.  Pulmonary:     Effort: Pulmonary effort is normal. No respiratory distress.     Breath sounds: No stridor. Rhonchi present. No wheezing or rales.     Comments: Harsh cough Abdominal:     General: Bowel sounds are normal. There is no distension.     Palpations: Abdomen is soft.     Tenderness: There is no abdominal tenderness. There is no guarding or rebound.  Musculoskeletal:        General: No tenderness or edema.     Cervical back: Neck supple.  Skin:    General: Skin is warm and dry.     Findings: No rash.  Neurological:     Mental Status: She is alert.     Cranial Nerves: No cranial nerve deficit (no facial droop, extraocular movements intact, no slurred speech).     Sensory: No sensory deficit.     Motor: Weakness present. No abnormal muscle tone or seizure activity.     Coordination: Coordination normal.     Deep Tendon Reflexes: Strength normal.     Comments: Patient is aware that she is in the emergency room, confused about the day, left-sided hemiparesis, 4-5 strength in the right side  Psychiatric:        Mood and Affect: Mood and affect normal.     ED Results / Procedures / Treatments   Labs (all labs ordered are listed, but only abnormal results are displayed) Labs Reviewed  CBC WITH DIFFERENTIAL/PLATELET - Abnormal; Notable for the following components:      Result Value   WBC 14.8 (*)    RBC 3.75 (*)    Hemoglobin 10.3 (*)    HCT 33.0 (*)    RDW 16.1 (*)    Neutro  Abs 13.4 (*)    Abs Immature Granulocytes 0.51 (*)    All other components within normal limits  COMPREHENSIVE METABOLIC PANEL - Abnormal; Notable for the following components:   Glucose, Bld 53 (*)    BUN 44 (*)    Calcium 8.7 (*)    Total Protein 5.7 (*)    Albumin 2.3 (*)    Total Bilirubin 0.2 (*)    All other components within normal limits  D-DIMER, QUANTITATIVE (NOT AT [facility]) - Abnormal; Notable for the following components:   D-Dimer, Quant 2.62 (*)    All other components within normal limits  LACTATE DEHYDROGENASE - Abnormal; Notable for the following components:   LDH 329 (*)    All other components within normal limits  FERRITIN - Abnormal; Notable for the following components:   Ferritin 346 (*)    All other components within normal limits  FIBRINOGEN - Abnormal; Notable for the following components:   Fibrinogen >800 (*)    All other components within normal limits  C-REACTIVE PROTEIN - Abnormal; Notable for the following components:   CRP 34.9 (*)    All other components within normal limits  CULTURE, BLOOD (ROUTINE X 2)  CULTURE, BLOOD (ROUTINE X 2)  LACTIC ACID, PLASMA  PROCALCITONIN  TRIGLYCERIDES  LACTIC ACID, PLASMA  URINALYSIS, ROUTINE W REFLEX MICROSCOPIC    EKG EKG Interpretation  Date/Time:  Friday October 16 2020 14:44:51 EST Ventricular Rate:  106 PR Interval:    QRS Duration: 83 QT Interval:  321 QTC Calculation: 427 R Axis:   -9 Text Interpretation: Sinus or ectopic atrial tachycardia Inferior infarct, old No significant change since last tracing Artifact Confirmed by Linwood Dibbles [id number]) on 10/16/2020 4:14:56  PM   Radiology DG Chest Port 1 View  Result Date: 10/16/2020 CLINICAL DATA:  Weakness, COVID-19 positive EXAM: PORTABLE CHEST 1 VIEW COMPARISON:  09/06/2019 FINDINGS: Heart size within normal limits. Loop recorder device. Patchy airspace opacities within the mid to lower lung fields bilaterally. No pleural effusion or pneumothorax.  IMPRESSION: Patchy airspace opacities within the mid to lower lung fields bilaterally compatible with multifocal atypical/viral pneumonia. Electronically Signed   By: Duanne Guess D.O.   On: 10/16/2020 15:39    Procedures .Critical Care Performed by: Linwood Dibbles, MD Authorized by: Linwood Dibbles, MD   Critical care provider statement:    Critical care time (minutes):  35   Critical care was time spent personally by me on the following activities:  Discussions with consultants, evaluation of patient's response to treatment, examination of patient, ordering and performing treatments and interventions, ordering and review of laboratory studies, ordering and review of radiographic studies, pulse oximetry, re-evaluation of patient's condition, obtaining history from patient or surrogate and review of old charts   (including critical care time)  Medications Ordered in ED Medications  sodium chloride flush (NS) 0.9 % injection 3 mL (3 mLs Intravenous Given 10/16/20 1526)  sodium chloride flush (NS) 0.9 % injection 3 mL (has no administration in time range)  0.9 %  sodium chloride infusion (has no administration in time range)  dextrose 5 % solution (has no administration in time range)  dexamethasone (DECADRON) injection 6 mg (6 mg Intravenous Given 10/16/20 1659)    ED Course  I have reviewed the triage vital signs and the nursing notes.  Pertinent labs & imaging results that were available during my care of the patient were reviewed by me and considered in my medical decision making (see chart for details).  Clinical Course as of 10/16/20 1706  Fri Oct 16, 2020  1636 Chest x-ray consistent with pneumonia.  White blood cell count elevated.  Patient has elevated BUN.  Glucose also decreased to 53 [JK]  1637 Hemoglobin decreased to 10.3 but elevated compared to previous values. [JK]  1637 Inflammatory markers of D-dimer and fibrinogen LDH elevated consistent with her diagnosis of covid [JK]   1650 Left message on pt's son phone number to call back [JK]  1650 Patient remains on supplemental nasal cannula oxygen.  Remains in the mid 90s 5 L [place] [JK]  1657 Spoke with son.  Updated him on status [JK]    Clinical Course User Index [JK] Linwood Dibbles, MD   MDM Rules/Calculators/A&P                         ZAYLAH BLECHA was evaluated in Emergency Department on 10/16/2020 for the symptoms described in the history of present illness. She was evaluated in the context of the global COVID-19 pandemic, which necessitated consideration that the patient might be at risk for infection with the SARS-CoV-2 virus that causes COVID-19. Institutional protocols and algorithms that pertain to the evaluation of patients at risk for COVID-19 are in a state of rapid change based on information released by regulatory bodies including the CDC and federal and state organizations. These policies and algorithms were followed during the patient's care in the ED.  Patient presented to the ED for evaluation of cough congestion and confusion.  Noted to be hypoxic at the nursing facility.  In the ED the patient does have an oxygen requirement that is new but is holding steady in the mid 90s.  Patient  was diagnosed with COVID according to nursing home report.  In the ED the patient does appear to have a pneumonia.  Her inflammatory markers are elevated consistent with COVID-pneumonia.  We will request a copy of her lab result from the facility.  Plan admission to the hospital for further treatment.  Final Clinical Impression(s) / ED Diagnoses Final diagnoses:  Pneumonia due to COVID-19 virus  Hypoglycemia      Linwood Dibbles, MD 10/16/20 1706

## 2020-10-16 NOTE — ED Triage Notes (Signed)
covid x 4 days pt presented with AMS and weakness per staff. Pt is bedbound, 77% RA applied 4-5L [place] 92%.

## 2020-10-16 NOTE — ED Notes (Signed)
Darlina Guys, Dir. Nursing, [facility] call with questions or problems, [phone]

## 2020-10-17 ENCOUNTER — Encounter (HOSPITAL_COMMUNITY): Payer: Self-pay | Admitting: Internal Medicine

## 2020-10-17 ENCOUNTER — Inpatient Hospital Stay (HOSPITAL_COMMUNITY): Payer: Medicare Other

## 2020-10-17 DIAGNOSIS — J1282 Pneumonia due to coronavirus disease 2019: Secondary | ICD-10-CM

## 2020-10-17 DIAGNOSIS — E162 Hypoglycemia, unspecified: Secondary | ICD-10-CM

## 2020-10-17 LAB — BLOOD CULTURE ID PANEL (REFLEXED) - BCID2

## 2020-10-17 LAB — CBC WITH DIFFERENTIAL/PLATELET
Abs Immature Granulocytes: 0.35 10*3/uL — ABNORMAL HIGH (ref 0.00–0.07)
Basophils Absolute: 0.1 10*3/uL (ref 0.0–0.1)
Basophils Relative: 0 %
Eosinophils Absolute: 0 10*3/uL (ref 0.0–0.5)
Eosinophils Relative: 0 %
HCT: 33.7 % — ABNORMAL LOW (ref 36.0–46.0)
Hemoglobin: 10.2 g/dL — ABNORMAL LOW (ref 12.0–15.0)
Immature Granulocytes: 3 %
Lymphocytes Relative: 4 %
Lymphs Abs: 0.4 10*3/uL — ABNORMAL LOW (ref 0.7–4.0)
MCH: 26.9 pg (ref 26.0–34.0)
MCHC: 30.3 g/dL (ref 30.0–36.0)
MCV: 88.9 fL (ref 80.0–100.0)
Monocytes Absolute: 0.1 10*3/uL (ref 0.1–1.0)
Monocytes Relative: 1 %
Neutro Abs: 10.4 10*3/uL — ABNORMAL HIGH (ref 1.7–7.7)
Neutrophils Relative %: 92 %
Platelets: 228 10*3/uL (ref 150–400)
RBC: 3.79 MIL/uL — ABNORMAL LOW (ref 3.87–5.11)
RDW: 16.1 % — ABNORMAL HIGH (ref 11.5–15.5)
WBC: 11.3 10*3/uL — ABNORMAL HIGH (ref 4.0–10.5)
nRBC: 0.2 % (ref 0.0–0.2)

## 2020-10-17 LAB — C-REACTIVE PROTEIN: CRP: 36.9 mg/dL — ABNORMAL HIGH (ref <1.0)

## 2020-10-17 LAB — COMPREHENSIVE METABOLIC PANEL
ALT: 35 U/L (ref 0–44)
AST: 35 U/L (ref 15–41)
Albumin: 2.4 g/dL — ABNORMAL LOW (ref 3.5–5.0)
Alkaline Phosphatase: 112 U/L (ref 38–126)
Anion gap: 13 (ref 5–15)
BUN: 44 mg/dL — ABNORMAL HIGH (ref 8–23)
CO2: 26 mmol/L (ref 22–32)
Calcium: 8.7 mg/dL — ABNORMAL LOW (ref 8.9–10.3)
Chloride: 107 mmol/L (ref 98–111)
Creatinine, Ser: 0.64 mg/dL (ref 0.44–1.00)
GFR, Estimated: >60 mL/min (ref >60)
Glucose, Bld: 74 mg/dL (ref 70–99)
Potassium: 3.9 mmol/L (ref 3.5–5.1)
Sodium: 146 mmol/L — ABNORMAL HIGH (ref 135–145)
Total Bilirubin: 0.4 mg/dL (ref 0.3–1.2)
Total Protein: 5.7 g/dL — ABNORMAL LOW (ref 6.5–8.1)

## 2020-10-17 LAB — D-DIMER, QUANTITATIVE: D-Dimer, Quant: 3.07 ug/mL-FEU — ABNORMAL HIGH (ref 0.00–0.50)

## 2020-10-17 LAB — FERRITIN: Ferritin: 329 ng/mL — ABNORMAL HIGH (ref 11–307)

## 2020-10-17 MED ORDER — VANCOMYCIN HCL 1500 MG/300ML IV SOLN
1500.0000 mg | INTRAVENOUS | Status: DC
  Administered 2020-10-18 – 2020-10-19 (×2): 1500 mg via INTRAVENOUS
  Filled 2020-10-17 (×2): qty 300

## 2020-10-17 MED ORDER — FUROSEMIDE 10 MG/ML IJ SOLN
20.0000 mg | Freq: Once | INTRAMUSCULAR | Status: AC
  Administered 2020-10-17: 20 mg via INTRAVENOUS
  Filled 2020-10-17: qty 2

## 2020-10-17 MED ORDER — VANCOMYCIN HCL 1750 MG/350ML IV SOLN
1750.0000 mg | Freq: Once | INTRAVENOUS | Status: AC
  Administered 2020-10-17: 1750 mg via INTRAVENOUS
  Filled 2020-10-17: qty 350

## 2020-10-17 MED ORDER — METHYLPREDNISOLONE SODIUM SUCC 125 MG IJ SOLR
1.0000 mg/kg | Freq: Two times a day (BID) | INTRAMUSCULAR | Status: DC
  Administered 2020-10-17 – 2020-10-19 (×4): 70 mg via INTRAVENOUS
  Filled 2020-10-17 (×5): qty 2

## 2020-10-17 MED ORDER — IOHEXOL 350 MG/ML SOLN
80.0000 mL | Freq: Once | INTRAVENOUS | Status: AC | PRN
  Administered 2020-10-17: 80 mL via INTRAVENOUS

## 2020-10-17 MED ORDER — SODIUM CHLORIDE 0.9 % IV SOLN
500.0000 mg | INTRAVENOUS | Status: DC
  Administered 2020-10-17 – 2020-10-19 (×3): 500 mg via INTRAVENOUS
  Filled 2020-10-17 (×4): qty 500

## 2020-10-17 MED ORDER — PREDNISONE 50 MG PO TABS
50.0000 mg | ORAL_TABLET | Freq: Every day | ORAL | Status: DC
Start: 2020-10-20 — End: 2020-10-19

## 2020-10-17 MED ORDER — SODIUM CHLORIDE 0.9 % IV SOLN
1.0000 g | INTRAVENOUS | Status: DC
  Administered 2020-10-17 – 2020-10-19 (×3): 1 g via INTRAVENOUS
  Filled 2020-10-17 (×3): qty 1
  Filled 2020-10-17: qty 10

## 2020-10-17 NOTE — Progress Notes (Addendum)
PROGRESS NOTE    Janice Little    Code Status: Partial Code  YIF:[id number] DOB: 16-Mar-1946 DOA: 10/16/2020 LOS: 1 days  PCP: Juluis Rainier, MD CC:  Chief Complaint  Patient presents with  . Altered Mental Status  . 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  She was diagnosed with COVID-19 on 1/10 and had been having increasing weakness and confusion since.  Found to be afebrile, tachycardic, tachypneic, hypoxic requiring 4 to 5 L/min O2 with presenting labs: Sodium 143, K3.7, glucose 53, BUN 44, creatinine 0.71, LDH 329, triglycerides 116, ferritin 346, CRP 35, lactic acid 1.2, procalcitonin 0.83, WBC 14.8, Hb 10.3, platelets 234, D-dimer 2.62, fibrinogen >800. Notable Imaging: CXR-patchy airspace opacity in mid to lower lung fields bilaterally. Patient received dexamethasone, remdesivir, dextrose 5%.     A & P   Principal Problem:   Acute hypoxemic respiratory failure due to COVID-19 [facility]) Active Problems:   Type 2 diabetes mellitus with hypoglycemia without coma (HCC)   Essential hypertension   Elevated C-reactive protein (CRP)   Acute sepsis (HCC)   Acute metabolic encephalopathy   1. Sepsis without septic shock  Acute hypoxic respiratory failure secondary to COVID-19 and suspected aspiration pneumonia a. Sepsis criteria on admission: Tachycardia, tachypnea, leukocytosis, hypoxia b. Supplemental O2 5-> 4 L/min today c. CRP continues to trend upward, currently 34.9-> 36.9. Given suspected concomitant bacterial pneumonia we will hold off on baricitinib d. D-dimer increased 2.62 -> 3.07.  CTA chest negative for PE but did show multifocal pneumonia compatible with COVID-19 as well as atelectasis and  trace pleural effusions. e. SLP consulted f. Lasix 20 mg IV x1 g. Increase steroids to 1 mg/kg h. Continue remdesivir i. MRSA nares positive-> add on vancomycin.  Continue ceftriaxone and azithromycin j. Continue inhalers k. Palliative care consulted for GOC discussion  2. Acute metabolic encephalopathy, multifactorial: Hypoxia, hypoglycemia, sepsis a. Significantly improved today.  More alert and able to answer questions more appropriately but still not oriented b. Unclear baseline c. Palliative care consulted  3. Hypertension a. Continue amlodipine b. As needed labetalol  4. Hypoglycemia in the setting of type 2 diabetes a. Received dextrose 5% in the ED b. Currently well controlled without insulin c. Continue to hold insulin   5. 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 Bedbound at baseline b. Continue home aspirin   DVT prophylaxis: enoxaparin (LOVENOX) injection 40 mg Start: 10/16/20 2000   Family Communication: [name] has been updated   Disposition Plan:  Status is: Inpatient  Remains inpatient appropriate because:IV treatments appropriate due to intensity of illness or inability to take PO and Inpatient level of care appropriate due to severity of illness   Dispo: The patient is from: SNF              Anticipated d/c is to: SNF              Anticipated d/c date is: > 3 days              Patient currently is not medically stable to d/c.           Pressure injury documentation   Pressure Injury 02/20/19 Other (Comment) Upper Stage II -  Partial  thickness loss of dermis presenting as a shallow open ulcer with a red, pink wound bed without slough. (Active)  02/20/19 2030  Location: Other (Comment)  Location Orientation: Upper  Staging: Stage II -  Partial thickness loss of dermis presenting as a shallow open ulcer with a red, pink wound bed without slough.  Wound Description (Comments):   Present on Admission: No      Pressure Injury 09/16/19 Sacrum Posterior Stage I -  Intact skin with non-blanchable redness of a localized area usually over a bony prominence. (Active)  09/16/19 1830  Location: Sacrum  Location Orientation: Posterior  Staging: Stage I -  Intact skin with non-blanchable redness of a localized area usually over a bony prominence.  Wound Description (Comments):   Present on Admission:      Pressure Injury 09/16/19 Heel Right;Left Stage I -  Intact skin with non-blanchable redness of a localized area usually over a bony prominence. (Active)  09/16/19 1830  Location: Heel  Location Orientation: Right;Left  Staging: Stage I -  Intact skin with non-blanchable redness of a localized area usually over a bony prominence.  Wound Description (Comments):   Present on Admission: Yes   Consultants  palliative  Procedures  None  Antibiotics   Anti-infectives (From admission, onward)   Start     Dose/Rate Route Frequency Ordered Stop   10/18/20 1000  vancomycin (VANCOREADY) IVPB 1500 mg/300 mL        1,500 mg 150 mL/hr over 120 Minutes Intravenous Every 24 hours 10/17/20 0957     10/17/20 1200  cefTRIAXone (ROCEPHIN) 1 g in sodium chloride 0.9 % 100 mL IVPB        1 g 200 mL/hr over 30 Minutes Intravenous Every 24 hours 10/17/20 1030     10/17/20 1130  azithromycin (ZITHROMAX) 500 mg in sodium chloride 0.9 % 250 mL IVPB        500 mg 250 mL/hr over 60 Minutes Intravenous Every 24 hours 10/17/20 1030     10/17/20 1000  remdesivir 100 mg in sodium chloride 0.9 % 100 mL IVPB       "Followed by" Linked Group Details   100 mg 200 mL/hr over 30 Minutes Intravenous Daily 10/16/20 1707 10/21/20 0959   10/17/20 0900  vancomycin (VANCOREADY) IVPB 1750 mg/350 mL        1,750 mg 175 mL/hr over 120 Minutes Intravenous  Once 10/17/20 0759 10/17/20 1338   10/16/20 1800  remdesivir 200 mg in sodium chloride 0.9% 250 mL IVPB       "Followed by" Linked Group Details   200 mg 580 mL/hr over 30  Minutes Intravenous Once 10/16/20 1707 10/16/20 1854   10/16/20 1800  cefTRIAXone (ROCEPHIN) 1 g in sodium chloride 0.9 % 100 mL IVPB  Status:  Discontinued        1 g 200 mL/hr over 30 Minutes Intravenous Every 24 hours 10/16/20 1747 10/17/20 1030   10/16/20 1800  azithromycin (ZITHROMAX) 500 mg in sodium chloride 0.9 % 250 mL IVPB  Status:  Discontinued        500 mg 250 mL/hr over 60 Minutes Intravenous Every 24 hours 10/16/20 1747 10/17/20 1030        Subjective   Patient seen and examined at bedside in no acute distress and resting comfortably.  Bedside nurse was able to titrate down supplemental O2 from 5 to 4 L/min this AM.  Patient is more conversant and denies any complaints currently.  No overnight events  Objective  Vitals:   10/17/20 0230 10/17/20 0454 10/17/20 1000 10/17/20 1457  BP: 128/84 (!) 158/81  (!) 158/84  Pulse: (!) 102 (!) 107  (!) 107  Resp:  14  17  Temp: 98 F (36.7 C) 97.8 F (36.6 C)  97.7 F (36.5 C)  TempSrc: Oral     SpO2: 96% 92% 94% 92%  Weight:      Height:        Intake/Output Summary (Last 24 hours) at 10/17/2020 1534 Last data filed at 10/17/2020 1500 Gross per 24 hour  Intake 120 ml  Output --  Net 120 ml   Filed Weights   10/16/20 1944  Weight: 70.3 kg    Examination:  Physical Exam Vitals and nursing note reviewed. Exam conducted with a chaperone present.  Constitutional:      General: She is not in acute distress. HENT:     Head: Normocephalic and atraumatic.  Eyes:     Conjunctiva/sclera: Conjunctivae normal.  Cardiovascular:     Rate and Rhythm: Normal rate and regular rhythm.  Pulmonary:     Effort: Pulmonary effort is normal. No respiratory distress.  Abdominal:     General: Abdomen is flat. There is no distension.  Musculoskeletal:        General: No swelling or tenderness.  Skin:    General: Skin is warm.     Coloration: Skin is not jaundiced.  Neurological:     Mental Status: She is alert. She is  disoriented.     Comments: Awake and alert, oriented x0  Psychiatric:        Mood and Affect: Mood normal.        Behavior: Behavior normal.     Data Reviewed: I have personally reviewed following labs and imaging studies  CBC: Recent Labs  Lab 10/16/20 1525 10/17/20 0458  WBC 14.8* 11.3*  NEUTROABS 13.4* 10.4*  HGB 10.3* 10.2*  HCT 33.0* 33.7*  MCV 88.0 88.9  PLT 234 228   Basic Metabolic Panel: Recent Labs  Lab 10/16/20 1525 10/17/20 0458  NA 143 146*  K 3.7 3.9  CL 104 107  CO2 27 26  GLUCOSE 53* 74  BUN 44* 44*  CREATININE 0.71 0.64  CALCIUM 8.7* 8.7*   GFR: Estimated Creatinine Clearance: 58.8 mL/min (by C-G formula based on SCr of 0.64 mg/dL). Liver Function Tests: Recent Labs  Lab 10/16/20 1525 10/17/20 0458  AST 38 35  ALT 36 35  ALKPHOS 107 112  BILITOT 0.2* 0.4  PROT 5.7* 5.7*  ALBUMIN 2.3* 2.4*   No results for input(s): LIPASE, AMYLASE in the last 168 hours. No results for input(s): AMMONIA in the last 168 hours. Coagulation Profile: No results for input(s): INR, PROTIME in the last 168 hours. Cardiac Enzymes: No results for input(s): CKTOTAL, CKMB, CKMBINDEX, TROPONINI in the last 168 hours. BNP (last 3 results) No results for input(s): PROBNP in the last 8760 hours. HbA1C: No results for input(s): HGBA1C in the last 72 hours. CBG: No results for input(s): GLUCAP in the last 168 hours. Lipid Profile: Recent Labs    10/16/20 1525  TRIG 116   Thyroid Function Tests: No results for input(s): TSH, T4TOTAL, FREET4, T3FREE, THYROIDAB in the last 72 hours. Anemia Panel: Recent Labs    10/16/20 1525 10/17/20 0458  FERRITIN 346* 329*   Sepsis Labs: Recent Labs  Lab 10/16/20 1525  PROCALCITON 0.83  LATICACIDVEN 1.2    Recent Results (from the past 240 hour(s))  Blood Culture (routine x  2)     Status: None (Preliminary result)   Collection Time: 10/16/20  3:25 PM   Specimen: BLOOD LEFT FOREARM  Result Value Ref Range Status    Specimen Description   Final    BLOOD LEFT FOREARM Performed at [facility], [address], [place], [place] [address]    Special Requests   Final    BOTTLES DRAWN AEROBIC AND ANAEROBIC Blood Culture adequate volume Performed at [facility], [address], [place], [place] [address]    Culture   Final    NO GROWTH < 24 HOURS Performed at [facility], [address] [place], [place] [address]    Report Status PENDING  Incomplete  Blood Culture (routine x 2)     Status: None (Preliminary result)   Collection Time: 10/16/20  3:30 PM   Specimen: BLOOD  Result Value Ref Range Status   Specimen Description   Final    BLOOD LEFT ANTECUBITAL Performed at [facility], [address] [place], [place] [address]    Special Requests   Final    BOTTLES DRAWN AEROBIC AND ANAEROBIC Blood Culture adequate volume Performed at [facility], [address] [place], [place] [address]    Culture  Setup Time   Final    GRAM POSITIVE COCCI IN CLUSTERS ANAEROBIC BOTTLE ONLY CRITICAL RESULT CALLED TO, READ BACK BY AND VERIFIED WITH: PHARMD N GLOGOVAC AT 1510 10/17/2020 BY L BENFIELD Performed at [facility], [address] [place], [place] [address]    Culture GRAM POSITIVE COCCI  Final   Report Status PENDING  Incomplete  Blood Culture ID Panel (Reflexed)     Status: Abnormal   Collection Time: 10/16/20  3:30 PM  Result Value Ref Range Status   Enterococcus faecalis NOT DETECTED NOT DETECTED Final   Enterococcus Faecium NOT DETECTED NOT DETECTED Final   Listeria monocytogenes NOT DETECTED NOT DETECTED Final   Staphylococcus species DETECTED (A) NOT DETECTED Final    Comment: CRITICAL RESULT CALLED TO, READ BACK BY AND VERIFIED WITH: PHARMD N GLOGOVAC AT 1510 10/17/2020 BY L BENFIELD    Staphylococcus aureus (BCID) NOT DETECTED NOT DETECTED Final   Staphylococcus epidermidis NOT DETECTED NOT DETECTED  Final   Staphylococcus lugdunensis NOT DETECTED NOT DETECTED Final   Streptococcus species NOT DETECTED NOT DETECTED Final   Streptococcus agalactiae NOT DETECTED NOT DETECTED Final   Streptococcus pneumoniae NOT DETECTED NOT DETECTED Final   Streptococcus pyogenes NOT DETECTED NOT DETECTED Final   A.calcoaceticus-baumannii NOT DETECTED NOT DETECTED Final   Bacteroides fragilis NOT DETECTED NOT DETECTED Final   Enterobacterales NOT DETECTED NOT DETECTED Final   Enterobacter cloacae complex NOT DETECTED NOT DETECTED Final   Escherichia coli NOT DETECTED NOT DETECTED Final   Klebsiella aerogenes NOT DETECTED NOT DETECTED Final   Klebsiella oxytoca NOT DETECTED NOT DETECTED Final   Klebsiella pneumoniae NOT DETECTED NOT DETECTED Final   Proteus species NOT DETECTED NOT DETECTED Final   Salmonella species NOT DETECTED NOT DETECTED Final   Serratia marcescens NOT DETECTED NOT DETECTED Final   Haemophilus influenzae NOT DETECTED NOT DETECTED Final   Neisseria meningitidis NOT DETECTED NOT DETECTED Final   Pseudomonas aeruginosa NOT DETECTED NOT DETECTED Final   Stenotrophomonas maltophilia NOT DETECTED NOT DETECTED Final   Candida albicans NOT DETECTED NOT DETECTED Final   Candida auris NOT DETECTED NOT DETECTED Final   Candida glabrata NOT DETECTED NOT DETECTED Final   Candida  krusei NOT DETECTED NOT DETECTED Final   Candida parapsilosis NOT DETECTED NOT DETECTED Final   Candida tropicalis NOT DETECTED NOT DETECTED Final   Cryptococcus neoformans/gattii NOT DETECTED NOT DETECTED Final    Comment: Performed at [facility], [address] [place], [place] [address]  MRSA PCR Screening     Status: Abnormal   Collection Time: 10/16/20  9:08 PM   Specimen: Nasopharyngeal  Result Value Ref Range Status   MRSA by PCR POSITIVE (A) NEGATIVE Final    Comment:        The GeneXpert MRSA Assay (FDA approved for NASAL specimens only), is one component of a comprehensive MRSA  colonization surveillance program. It is not intended to diagnose MRSA infection nor to guide or monitor treatment for MRSA infections. CRITICAL RESULT CALLED TO, READ BACK BY AND VERIFIED WITH: PAM HAMILTON RN 10/16/2020 @2249  BY P.HENDERSON Performed at [facility], [address] [place], [place] [address]          Radiology Studies: CT ANGIO CHEST PE W OR WO CONTRAST  Result Date: 10/17/2020 CLINICAL DATA:  Positive D-dimer. Clinical suspicion for pulmonary embolus. EXAM: CT ANGIOGRAPHY CHEST WITH CONTRAST TECHNIQUE: Multidetector CT imaging of the chest was performed using the standard protocol during bolus administration of intravenous contrast. Multiplanar CT image reconstructions and MIPs were obtained to evaluate the vascular anatomy. CONTRAST:  80mL OMNIPAQUE IOHEXOL 350 MG/ML SOLN COMPARISON:  Chest radiograph, 10/16/2020 and earlier exams. FINDINGS: Cardiovascular: Pulmonary arteries are well opacified. There is no evidence of a pulmonary embolism. Heart is normal in size and configuration. No pericardial effusion. No coronary artery calcifications. Great vessels are normal in caliber. Mild aortic atherosclerosis. No dissection. Mediastinum/Nodes: Unremarkable thyroid. No neck base or axillary masses or enlarged lymph nodes. There are subcentimeter mediastinal lymph nodes and hilar lymph nodes. No largest node is a right subcarinal node measuring 12 mm in short axis. No mediastinal or hilar masses. Trachea and esophagus are unremarkable. Lungs/Pleura: Trace pleural effusions. Bilateral patchy areas ground-glass and more confluent airspace opacity involving both lungs and all lobes. Additional opacity is noted in dependent aspects of the lower lobes consistent with atelectasis. No evidence of pulmonary edema. No pneumothorax. Upper Abdomen: No acute findings. Intermediate attenuation material noted in the gallbladder which may reflect stones or sludge.  Musculoskeletal: No fracture or acute finding.  No bone lesion. Review of the MIP images confirms the above findings. IMPRESSION: 1. No evidence of a pulmonary embolism. 2. Bilateral airspace lung opacities consistent with multifocal pneumonia, compatible with COVID-19 infection. Additional dependent lower lobe atelectasis with trace pleural effusions. Aortic Atherosclerosis (ICD10-I70.0). Electronically Signed   By: Amie Portlandavid  Ormond M.D.   On: 10/17/2020 11:42   DG Chest Port 1 View  Result Date: 10/16/2020 CLINICAL DATA:  Weakness, COVID-19 positive EXAM: PORTABLE CHEST 1 VIEW COMPARISON:  09/06/2019 FINDINGS: Heart size within normal limits. Loop recorder device. Patchy airspace opacities within the mid to lower lung fields bilaterally. No pleural effusion or pneumothorax. IMPRESSION: Patchy airspace opacities within the mid to lower lung fields bilaterally compatible with multifocal atypical/viral pneumonia. Electronically Signed   By: Duanne GuessNicholas  Plundo D.O.   On: 10/16/2020 15:39        Scheduled Meds: . amLODipine  5 mg Oral Daily  . aspirin  325 mg Oral Daily  . Chlorhexidine Gluconate Cloth  6 each Topical Q0600  . DULoxetine  60 mg Oral Daily  . enoxaparin (LOVENOX) injection  40 mg Subcutaneous Q24H  .  furosemide  20 mg Intravenous Once  . Ipratropium-Albuterol  1 puff Inhalation Q6H  . methylPREDNISolone (SOLU-MEDROL) injection  1 mg/kg Intravenous Q12H   Followed by  . [START ON 10/20/2020] predniSONE  50 mg Oral Daily  . mupirocin ointment  1 application Nasal BID  . sodium chloride flush  3 mL Intravenous Q12H   Continuous Infusions: . sodium chloride    . azithromycin 500 mg (10/17/20 1239)  . cefTRIAXone (ROCEPHIN)  IV 1 g (10/17/20 1425)  . remdesivir 100 mg in NS 100 mL 100 mg (10/17/20 0838)  . [START ON 10/18/2020] vancomycin       Time spent: 26 minutes with over 50% of the time coordinating the patient's care    Jae Dire, DO [facility]   Call  night coverage person covering after 7pm

## 2020-10-17 NOTE — Progress Notes (Signed)
PHARMACY - PHYSICIAN COMMUNICATION CRITICAL VALUE ALERT - BLOOD CULTURE IDENTIFICATION (BCID)  Janice Little is an 75 y.o. female who presented to [facility] on 10/16/2020 with a chief complaint of  Chief Complaint  Patient presents with  . Altered Mental Status  . Weakness     Assessment:  Sepsis without septic shock  Acute hypoxic respiratory failure secondary to COVID-19 and suspected aspiration pneumonia   Name of physician (or Provider) Contacted: Dr. Dairl Ponder  Current antibiotics: vancomycin, azithromycin, ceftriaxone  Changes to prescribed antibiotics recommended:  Continue current regimen   Results for orders placed or performed during the hospital encounter of 10/16/20  Blood Culture ID Panel (Reflexed) (Collected: 10/16/2020  3:30 PM)  Result Value Ref Range   Enterococcus faecalis NOT DETECTED NOT DETECTED   Enterococcus Faecium NOT DETECTED NOT DETECTED   Listeria monocytogenes NOT DETECTED NOT DETECTED   Staphylococcus species DETECTED (A) NOT DETECTED   Staphylococcus aureus (BCID) NOT DETECTED NOT DETECTED   Staphylococcus epidermidis NOT DETECTED NOT DETECTED   Staphylococcus lugdunensis NOT DETECTED NOT DETECTED   Streptococcus species NOT DETECTED NOT DETECTED   Streptococcus agalactiae NOT DETECTED NOT DETECTED   Streptococcus pneumoniae NOT DETECTED NOT DETECTED   Streptococcus pyogenes NOT DETECTED NOT DETECTED   A.calcoaceticus-baumannii NOT DETECTED NOT DETECTED   Bacteroides fragilis NOT DETECTED NOT DETECTED   Enterobacterales NOT DETECTED NOT DETECTED   Enterobacter cloacae complex NOT DETECTED NOT DETECTED   Escherichia coli NOT DETECTED NOT DETECTED   Klebsiella aerogenes NOT DETECTED NOT DETECTED   Klebsiella oxytoca NOT DETECTED NOT DETECTED   Klebsiella pneumoniae NOT DETECTED NOT DETECTED   Proteus species NOT DETECTED NOT DETECTED   Salmonella species NOT DETECTED NOT DETECTED   Serratia marcescens NOT DETECTED NOT DETECTED    Haemophilus influenzae NOT DETECTED NOT DETECTED   Neisseria meningitidis NOT DETECTED NOT DETECTED   Pseudomonas aeruginosa NOT DETECTED NOT DETECTED   Stenotrophomonas maltophilia NOT DETECTED NOT DETECTED   Candida albicans NOT DETECTED NOT DETECTED   Candida auris NOT DETECTED NOT DETECTED   Candida glabrata NOT DETECTED NOT DETECTED   Candida krusei NOT DETECTED NOT DETECTED   Candida parapsilosis NOT DETECTED NOT DETECTED   Candida tropicalis NOT DETECTED NOT DETECTED   Cryptococcus neoformans/gattii NOT DETECTED NOT DETECTED     Adalberto Cole, PharmD, BCPS 10/17/2020 4:33 PM

## 2020-10-17 NOTE — Evaluation (Signed)
Clinical/Bedside Swallow Evaluation Patient Details  Name: Janice Little MRN: [id number] Date of Birth: September 13, 1946  Today's Date: 10/17/2020 Time: SLP Start Time (ACUTE ONLY): 1313 SLP Stop Time (ACUTE ONLY): 1333 SLP Time Calculation (min) (ACUTE ONLY): 20 min  Past Medical History:  Past Medical History:  Diagnosis Date  . Chronic kidney disease (CKD), stage II (mild)   . Chronic pain   . DM type 2 (diabetes mellitus, type 2) (HCC)   . Fatty liver    per CT  . GERD (gastroesophageal reflux disease)   . History of hepatitis as a child    age 75 (jaundice)  . HTN (hypertension)   . Hyperlipidemia   . MS (multiple sclerosis) (HCC)   . Obesity   . Osteopenia   . Primary hyperparathyroidism (HCC)    s/p surgery removal of parathyroid (Dr. Sharl Ma, Dr. Gerrit Friends)  . Vitamin D deficiency    Past Surgical History:  Past Surgical History:  Procedure Laterality Date  . ANTERIOR CRUCIATE LIGAMENT REPAIR Left   . LOOP RECORDER INSERTION N/A 03/05/2019   Procedure: LOOP RECORDER INSERTION;  Surgeon: Marinus Maw, MD;  Location: [facility] INVASIVE CV LAB;  Service: Cardiovascular;  Laterality: N/A;  . 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  Patient was diagnosed with COVID-19 4 days ago and has been having increased weakness and confusion.  Patient is bedbound at baseline.  Dx sepsis, acute hypoxic resp failure secondary to COVID 19, suspected asp pna.   Assessment / Plan / Recommendation Clinical Impression  Pt presents with an acute dysphagia likely related to change in mental status.  She verbalized being hungry; stated DOB/name but otherwise  disoriented. She had no difficulty drinking thin liquids, with good oral attention and swift swallow response, consuming 6 oz with no s/s of aspiration. She required assist with feeding with RUE due to tremor.  She eagerly consumed a container of applesauce without difficulty; crackers were associated with frequent coughing and prolonged mastication.  For now, recommend a dysphagia 1 diet with thin liquids; give meds whole in puree. Pt will need assistance with feeding while admitted.  SLP will follow for safety/diet advancement. 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  Patient was diagnosed with COVID-19 4 days ago and has been having increased weakness and confusion.  Patient is bedbound at baseline.  Dx sepsis, acute hypoxic resp failure secondary to COVID 19, suspected asp pna. Type of Study: Bedside Swallow Evaluation Previous Swallow Assessment: none per  records Diet Prior to this Study: NPO Temperature Spikes Noted: No Respiratory Status: Nasal cannula History of Recent Intubation: No Behavior/Cognition: Alert;Confused Oral Cavity Assessment: Dry Oral Care Completed by SLP: Yes Oral Cavity - Dentition: Adequate natural  dentition Vision: Functional for self-feeding Self-Feeding Abilities: Needs assist Patient Positioning: Upright in bed Baseline Vocal Quality: Normal Volitional Cough: Cognitively unable to elicit    Oral/Motor/Sensory Function     Ice Chips Ice chips: Within functional limits   Thin Liquid Thin Liquid: Within functional limits    Nectar Thick Nectar Thick Liquid: Not tested   Honey Thick Honey Thick Liquid: Not tested   Puree Puree: Within functional limits   Solid     Solid: Impaired Oral Phase Functional Implications: Prolonged oral transit;Impaired mastication Pharyngeal Phase Impairments: Cough - Delayed      Blenda Mounts Laurice 10/17/2020,1:37 PM  Kryssa Risenhoover L. Samson Frederic, MA CCC/SLP Acute Rehabilitation Services Office number [phone] Pager [phone]

## 2020-10-17 NOTE — Plan of Care (Signed)

## 2020-10-17 NOTE — Progress Notes (Signed)
Pharmacy Antibiotic Note  Janice Little is a 75 y.o. female admitted on 10/16/2020 with suspected bacterial pneumonia.  Of, note patient was diagnosed with COVID-19 4 days ago and has been having increased weakness and confusion.  Pharmacy has been consulted for vancomycin dosing.  Plan: Vancomycin 1750 mg IV x 1 then vancomycin 1500 mg IV every 24 hours (Scr 0.8, calculated AUC 498, TBW, Vd coefficient 0.72) Monitor clinical picture, renal function, vancomycin levels if indicated F/U C&S, abx deescalation / LOT   Height: 5\' 5"  (165.1 cm) Weight: 70.3 kg (154 lb 15.7 oz) IBW/kg (Calculated) : 57  Temp (24hrs), Avg:98.2 F (36.8 C), Min:97 F (36.1 C), Max:99.3 F (37.4 C)  Recent Labs  Lab 10/16/20 1525 10/17/20 0458  WBC 14.8* 11.3*  CREATININE 0.71 0.64  LATICACIDVEN 1.2  --     Estimated Creatinine Clearance: 58.8 mL/min (by C-G formula based on SCr of 0.64 mg/dL).    No Known Allergies   Microbiology results: 10/16/20 BCx: sent 10/16/20 MRSA PCR: positive  Thank you for allowing pharmacy to be a part of this patient's care.  10/18/20 10/17/2020 10:00 AM

## 2020-10-18 DIAGNOSIS — Z7189 Other specified counseling: Secondary | ICD-10-CM

## 2020-10-18 DIAGNOSIS — Z515 Encounter for palliative care: Secondary | ICD-10-CM

## 2020-10-18 LAB — CBC WITH DIFFERENTIAL/PLATELET
Abs Immature Granulocytes: 0.35 10*3/uL — ABNORMAL HIGH (ref 0.00–0.07)
Basophils Absolute: 0 10*3/uL (ref 0.0–0.1)
Basophils Relative: 1 %
Eosinophils Absolute: 0.2 10*3/uL (ref 0.0–0.5)
Eosinophils Relative: 3 %
HCT: 30.4 % — ABNORMAL LOW (ref 36.0–46.0)
Hemoglobin: 9.4 g/dL — ABNORMAL LOW (ref 12.0–15.0)
Immature Granulocytes: 5 %
Lymphocytes Relative: 7 %
Lymphs Abs: 0.5 10*3/uL — ABNORMAL LOW (ref 0.7–4.0)
MCH: 27.3 pg (ref 26.0–34.0)
MCHC: 30.9 g/dL (ref 30.0–36.0)
MCV: 88.4 fL (ref 80.0–100.0)
Monocytes Absolute: 0.2 10*3/uL (ref 0.1–1.0)
Monocytes Relative: 3 %
Neutro Abs: 5.7 10*3/uL (ref 1.7–7.7)
Neutrophils Relative %: 81 %
Platelets: 244 10*3/uL (ref 150–400)
RBC: 3.44 MIL/uL — ABNORMAL LOW (ref 3.87–5.11)
RDW: 16.3 % — ABNORMAL HIGH (ref 11.5–15.5)
WBC: 7 10*3/uL (ref 4.0–10.5)
nRBC: 0.4 % — ABNORMAL HIGH (ref 0.0–0.2)

## 2020-10-18 LAB — COMPREHENSIVE METABOLIC PANEL
ALT: 38 U/L (ref 0–44)
AST: 31 U/L (ref 15–41)
Albumin: 2.3 g/dL — ABNORMAL LOW (ref 3.5–5.0)
Alkaline Phosphatase: 117 U/L (ref 38–126)
Anion gap: 15 (ref 5–15)
BUN: 48 mg/dL — ABNORMAL HIGH (ref 8–23)
CO2: 25 mmol/L (ref 22–32)
Calcium: 8.5 mg/dL — ABNORMAL LOW (ref 8.9–10.3)
Chloride: 107 mmol/L (ref 98–111)
Creatinine, Ser: 0.76 mg/dL (ref 0.44–1.00)
GFR, Estimated: >60 mL/min (ref >60)
Glucose, Bld: 303 mg/dL — ABNORMAL HIGH (ref 70–99)
Potassium: 3.1 mmol/L — ABNORMAL LOW (ref 3.5–5.1)
Sodium: 147 mmol/L — ABNORMAL HIGH (ref 135–145)
Total Bilirubin: 0.5 mg/dL (ref 0.3–1.2)
Total Protein: 5.3 g/dL — ABNORMAL LOW (ref 6.5–8.1)

## 2020-10-18 LAB — D-DIMER, QUANTITATIVE: D-Dimer, Quant: 2.13 ug/mL-FEU — ABNORMAL HIGH (ref 0.00–0.50)

## 2020-10-18 LAB — C-REACTIVE PROTEIN: CRP: 23.9 mg/dL — ABNORMAL HIGH (ref <1.0)

## 2020-10-18 LAB — FERRITIN: Ferritin: 331 ng/mL — ABNORMAL HIGH (ref 11–307)

## 2020-10-18 LAB — GLUCOSE, CAPILLARY
Glucose-Capillary: 591 mg/dL (ref 70–99)
Glucose-Capillary: 455 mg/dL — ABNORMAL HIGH (ref 70–99)

## 2020-10-18 MED ORDER — INSULIN ASPART 100 UNIT/ML ~~LOC~~ SOLN
0.0000 [IU] | Freq: Three times a day (TID) | SUBCUTANEOUS | Status: DC
  Administered 2020-10-18: 9 [IU] via SUBCUTANEOUS

## 2020-10-18 MED ORDER — INSULIN DETEMIR 100 UNIT/ML ~~LOC~~ SOLN
0.1500 [IU]/kg | Freq: Two times a day (BID) | SUBCUTANEOUS | Status: DC
  Administered 2020-10-18 – 2020-10-21 (×7): 11 [IU] via SUBCUTANEOUS
  Filled 2020-10-18 (×8): qty 0.11

## 2020-10-18 MED ORDER — INSULIN ASPART 100 UNIT/ML ~~LOC~~ SOLN: 5.0000 [IU] | Freq: Three times a day (TID) | SUBCUTANEOUS | Status: DC

## 2020-10-18 MED ORDER — INSULIN ASPART 100 UNIT/ML ~~LOC~~ SOLN
5.0000 [IU] | Freq: Once | SUBCUTANEOUS | Status: AC
  Administered 2020-10-18: 5 [IU] via SUBCUTANEOUS

## 2020-10-18 MED ORDER — INSULIN ASPART 100 UNIT/ML ~~LOC~~ SOLN
0.0000 [IU] | Freq: Three times a day (TID) | SUBCUTANEOUS | Status: DC
  Administered 2020-10-19 (×3): 20 [IU] via SUBCUTANEOUS
  Administered 2020-10-20: 4 [IU] via SUBCUTANEOUS
  Administered 2020-10-20: 3 [IU] via SUBCUTANEOUS
  Administered 2020-10-20: 17 [IU] via SUBCUTANEOUS
  Administered 2020-10-21 (×2): 7 [IU] via SUBCUTANEOUS
  Administered 2020-10-22: 15 [IU] via SUBCUTANEOUS
  Administered 2020-10-22 – 2020-10-23 (×2): 7 [IU] via SUBCUTANEOUS
  Administered 2020-10-23: 15 [IU] via SUBCUTANEOUS
  Administered 2020-10-23: 7 [IU] via SUBCUTANEOUS

## 2020-10-18 MED ORDER — INSULIN GLARGINE 100 UNIT/ML ~~LOC~~ SOLN
28.0000 [IU] | Freq: Every day | SUBCUTANEOUS | Status: DC
  Administered 2020-10-18: 28 [IU] via SUBCUTANEOUS
  Filled 2020-10-18: qty 0.28

## 2020-10-18 MED ORDER — ENSURE ENLIVE PO LIQD
237.0000 mL | Freq: Two times a day (BID) | ORAL | Status: DC
  Administered 2020-10-18 – 2020-10-23 (×10): 237 mL via ORAL

## 2020-10-18 MED ORDER — ADULT MULTIVITAMIN W/MINERALS CH
1.0000 | ORAL_TABLET | Freq: Every day | ORAL | Status: DC
  Administered 2020-10-18 – 2020-10-23 (×6): 1 via ORAL
  Filled 2020-10-18 (×6): qty 1

## 2020-10-18 MED ORDER — INSULIN ASPART 100 UNIT/ML ~~LOC~~ SOLN: 9.0000 [IU] | Freq: Once | SUBCUTANEOUS | Status: DC

## 2020-10-18 MED ORDER — LINAGLIPTIN 5 MG PO TABS
5.0000 mg | ORAL_TABLET | Freq: Every day | ORAL | Status: DC
  Administered 2020-10-18 – 2020-10-21 (×4): 5 mg via ORAL
  Filled 2020-10-18 (×4): qty 1

## 2020-10-18 MED ORDER — DEXTROSE 5 % IV SOLN: INTRAVENOUS | Status: DC

## 2020-10-18 MED ORDER — POTASSIUM CHLORIDE CRYS ER 20 MEQ PO TBCR
40.0000 meq | EXTENDED_RELEASE_TABLET | Freq: Once | ORAL | Status: AC
  Administered 2020-10-18: 40 meq via ORAL
  Filled 2020-10-18: qty 2

## 2020-10-18 NOTE — Progress Notes (Signed)
  Speech Language Pathology Treatment: Dysphagia  Patient Details Name: Janice Little MRN: [id number] DOB: 1946/01/11 Today's Date: 10/18/2020 Time: 1500-1520 SLP Time Calculation (min) (ACUTE ONLY): 20 min  Assessment / Plan / Recommendation Clinical Impression  Patient seen for dysphagia tx to assess for toleration of recommended diet (Dys 1, thin liquids) and trials of upgrade solids. Patient was lethargic but able to maintain alertness adequately for PO intake. She was very slow to respond but would eventually make a one-word response ("juice", "ice", etc). She tolerated straw sips of thin liquids (juice, water) without coughing, throat clearing or changes in vitals. Oxygen saturation remained at 100% and RR was WNL. Voice was weak but clear. Patient declined gelatin and appeared to mainly want liquids. She started to close her eyes and SLP asked her if she wanted more or if she wanted to rest and she replied "both". SLP is recommending to keep patient on current diet of Dys 1, thin liquids but will continue to follow for potential readiness to upgrade solids.    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  Patient was diagnosed with COVID-19 4 days ago and has been having increased weakness and confusion.  Patient is bedbound at baseline.  Dx sepsis, acute hypoxic resp failure secondary to COVID 19, suspected asp pna.      SLP Plan  Continue with current plan of care       Recommendations  Diet recommendations: Dysphagia 1 (puree);Thin liquid Liquids provided via: Cup;Straw Medication Administration: Whole meds with puree Supervision: Full supervision/cueing for compensatory  strategies;Staff to assist with self feeding Compensations: Minimize environmental distractions;Slow rate;Small sips/bites Postural Changes and/or Swallow Maneuvers: Seated upright 90 degrees                Oral Care Recommendations: Oral care BID Follow up Recommendations: Other (comment);24 hour supervision/assistance (TBD, likely SNF) SLP Visit Diagnosis: Dysphagia, unspecified (R13.10) Plan: Continue with current plan of care       GO               Angela Nevin, MA, CCC-SLP Speech Therapy

## 2020-10-18 NOTE — Progress Notes (Signed)
Initial Nutrition Assessment  DOCUMENTATION CODES:   Not applicable  INTERVENTION:   -Ensure Enlive po BID, each supplement provides 350 kcal and 20 grams of protein -MVI with minerals daily -Magic cup TID with meals, each supplement provides 290 kcal and 9 grams of protein -Feeding assistance with meals  NUTRITION DIAGNOSIS:   Increased nutrient needs related to acute illness (COVID-19) as evidenced by estimated needs.  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  Patient was diagnosed with COVID-19 4 days ago and has been having increased weakness and confusion.  Patient is bedbound at baseline.  Patient has been coughing but unsure if any fever.  Patient denies any specific complaints currently.  Pt admitted with sepsis and acute respiratory failure secondary to COVID-19 and suspected aspiration pneumonia.   1/15- s/p BSE- advanced to dysphagia 1 diet with thin liquids  Reviewed I/O's: -660 ml x 24 hours  UOP: 900 ml x 24 hours  Pt unavailable at time of attempted contact.   Per SLP notes, pt with acute dysphagia likely secondary to mental status. Pt is tolerating liquids without difficulty. Noted meal completion 40%. Per SLP notes, pt will require feeding assistance.   Reviewed wt hx; pt has experienced a 5.5% wt loss over the past 7 months, which is not significant for time frame.   Pt with poor oral intake and would benefit from nutrient  dense supplement. One Ensure Enlive supplement provides 350 kcals, 20 grams protein, and 44-45 grams of carbohydrate vs one Glucerna shake supplement, which provides 220 kcals, 10 grams of protein, and 26 grams of carbohydrate. Given pt's hx of DM, RD will reassess adequacy of PO intake, CBGS, and adjust supplement regimen as appropriate at follow-up.   Medications reviewed and include solu-medrol, prednisone, and remdesivir.   Lab Results  Component Value Date   HGBA1C 5.6 03/01/2019   PTA DM medications are 500 mg metformin BID, 34 units insulin glargine daily at bedtime, and 2-11 units insulin lispro TID before meals.   Labs reviewed:Na: 147, K: 3.1. Inpatient orders for glycemic control are none.  Diet Order:   Diet Order            DIET - DYS 1 Room service appropriate? Yes; Fluid consistency: Thin  Diet effective now                 EDUCATION NEEDS:   No education needs have been identified at this time  Skin:  Skin Assessment: Reviewed RN Assessment  Last BM:  10/18/20  Height:   Ht Readings from Last 1 Encounters:  10/16/20 5\' 5"  (1.651 m)    Weight:   Wt Readings from Last 1 Encounters:  10/16/20 70.3 kg    Ideal Body Weight:  56.8 kg  BMI:  Body mass index is 25.79 kg/m.  Estimated Nutritional Needs:   Kcal:  2000-2200  Protein:  110-125 grams  Fluid:  > 2 L    10/18/20, RD, LDN, CDCES Registered Dietitian II Certified Diabetes Care and Education Specialist Please refer to  AMION for RD and/or RD on-call/weekend/after hours pager

## 2020-10-18 NOTE — Plan of Care (Signed)

## 2020-10-18 NOTE — Progress Notes (Signed)
PROGRESS NOTE    Janice Little    Code Status: Partial Code  VQX:[id number] DOB: [birth year] DOA: 10/16/2020 LOS: 2 days  PCP: Juluis Rainier, MD CC:  Chief Complaint  Patient presents with  . Altered Mental Status  . 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  She was diagnosed with COVID-19 on 1/10 and had been having increasing weakness and confusion since.  Found to be afebrile, tachycardic, tachypneic, hypoxic requiring 4 to 5 L/min O2 with presenting labs: Sodium 143, K3.7, glucose 53, BUN 44, creatinine 0.71, LDH 329, triglycerides 116, ferritin 346, CRP 35, lactic acid 1.2, procalcitonin 0.83, WBC 14.8, Hb 10.3, platelets 234, D-dimer 2.62, fibrinogen >800. Notable Imaging: CXR-patchy airspace opacity in mid to lower lung fields bilaterally. Patient received dexamethasone, remdesivir, dextrose 5%.     A & P   Principal Problem:   Acute hypoxemic respiratory failure due to COVID-19 [facility]) Active Problems:   Type 2 diabetes mellitus with hypoglycemia without coma (HCC)   Essential hypertension   Elevated C-reactive protein (CRP)   Acute sepsis (HCC)   Acute metabolic encephalopathy   1. Sepsis without septic shock  Acute hypoxic respiratory failure secondary to COVID-19 and suspected aspiration pneumonia a. Sepsis criteria on admission: Tachycardia, tachypnea, leukocytosis, hypoxia b. Supplemental O2 5-> 4 L/min -> 2 L/min today after receiving Lasix yesterday c. CRP finally trending downward, currently 34.9-> 36.9-> 24. Given suspected concomitant bacterial pneumonia we will hold off on baricitinib d. D-dimer improving e. CTA chest negative for PE but did show multifocal pneumonia compatible with  COVID-19 as well as atelectasis and trace pleural effusions. f. SLP consulted g. Appears dry today, hold off further Lasix h. Continue increased steroids to 1 mg/kg i. Continue remdesivir j. MRSA nares positive-> added on vancomycin.  Continue ceftriaxone and azithromycin for total 5 days antibiotics k. Continue inhalers l. Palliative care consulted for GOC discussion  2. Acute metabolic encephalopathy, multifactorial: Hypoxia, hypoglycemia, sepsis a. Hypoxia and sepsis have all significantly improved and hypoglycemia is now hyperglycemia b. Was more conversant yesterday, possibly due to new hypernatremia  c. Palliative care consulted  3. Hypernatremia a. 1.6 L free water deficit b. Start D5W  4. Hypertension a. Continue amlodipine b. As needed labetalol  5. Hypoglycemia, now hyperglycemia in the setting of type 2 diabetes a. Received dextrose 5% in the ED for hypoglycemia b. Glucose currently in 300s c. Start Lantus and sensitive sliding scale, especially while getting D5W  6. 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 Bedbound at baseline b. Continue home aspirin   DVT prophylaxis: enoxaparin (LOVENOX) injection 40 mg Start: 10/16/20 2000   Family Communication: [name] has been updated today  Disposition Plan:  Status is: Inpatient  Remains inpatient appropriate because:IV treatments appropriate due to intensity of illness or inability to take PO and Inpatient level of care appropriate due to severity of illness   Dispo: The patient is from: SNF              Anticipated d/c is to: SNF              Anticipated d/c date is: > 3 days  Patient currently is not medically stable to d/c.           Pressure injury documentation   Pressure Injury 02/20/19 Other (Comment) Upper Stage II -  Partial thickness loss of dermis presenting as a shallow open ulcer with a red, pink wound bed without slough. (Active)  02/20/19  2030  Location: Other (Comment)  Location Orientation: Upper  Staging: Stage II -  Partial thickness loss of dermis presenting as a shallow open ulcer with a red, pink wound bed without slough.  Wound Description (Comments):   Present on Admission: No     Pressure Injury 09/16/19 Sacrum Posterior Stage I -  Intact skin with non-blanchable redness of a localized area usually over a bony prominence. (Active)  09/16/19 1830  Location: Sacrum  Location Orientation: Posterior  Staging: Stage I -  Intact skin with non-blanchable redness of a localized area usually over a bony prominence.  Wound Description (Comments):   Present on Admission:      Pressure Injury 09/16/19 Heel Right;Left Stage I -  Intact skin with non-blanchable redness of a localized area usually over a bony prominence. (Active)  09/16/19 1830  Location: Heel  Location Orientation: Right;Left  Staging: Stage I -  Intact skin with non-blanchable redness of a localized area usually over a bony prominence.  Wound Description (Comments):   Present on Admission: Yes   Consultants  palliative  Procedures  None  Antibiotics   Anti-infectives (From admission, onward)   Start     Dose/Rate Route Frequency Ordered Stop   10/18/20 1000  vancomycin (VANCOREADY) IVPB 1500 mg/300 mL        1,500 mg 150 mL/hr over 120 Minutes Intravenous Every 24 hours 10/17/20 0957     10/17/20 1200  cefTRIAXone (ROCEPHIN) 1 g in sodium chloride 0.9 % 100 mL IVPB        1 g 200 mL/hr over 30 Minutes Intravenous Every 24 hours 10/17/20 1030     10/17/20 1130  azithromycin (ZITHROMAX) 500 mg in sodium chloride 0.9 % 250 mL IVPB        500 mg 250 mL/hr over 60 Minutes Intravenous Every 24 hours 10/17/20 1030     10/17/20 1000  remdesivir 100 mg in sodium chloride 0.9 % 100 mL IVPB       "Followed by" Linked Group Details   100 mg 200 mL/hr over 30 Minutes Intravenous Daily 10/16/20 1707 10/21/20 0959   10/17/20 0900  vancomycin (VANCOREADY)  IVPB 1750 mg/350 mL        1,750 mg 175 mL/hr over 120 Minutes Intravenous  Once 10/17/20 0759 10/17/20 1338   10/16/20 1800  remdesivir 200 mg in sodium chloride 0.9% 250 mL IVPB       "Followed by" Linked Group Details   200 mg 580 mL/hr over 30 Minutes Intravenous Once 10/16/20 1707 10/16/20 1854   10/16/20 1800  cefTRIAXone (ROCEPHIN) 1 g in sodium chloride 0.9 % 100 mL IVPB  Status:  Discontinued        1 g 200 mL/hr over 30 Minutes Intravenous Every 24 hours 10/16/20 1747 10/17/20 1030   10/16/20 1800  azithromycin (ZITHROMAX) 500 mg in sodium chloride 0.9 % 250 mL IVPB  Status:  Discontinued        500 mg 250 mL/hr over 60 Minutes Intravenous Every 24 hours 10/16/20 1747 10/17/20 1030        Subjective   Seen and examined at bedside no acute distress and resting comfortably.  Less conversant today and not providing much history but is awake and alert.  Objective   Vitals:   10/17/20 2229 10/18/20 0632 10/18/20 1020 10/18/20 1307  BP: (!) 134/103 (!) 187/92  (!) 166/81  Pulse: (!) 110 (!) 110  (!) 116  Resp: 18 18    Temp: 97.6 F (36.4 C) 97.8 F (36.6 C)  99.4 F (37.4 C)  TempSrc:      SpO2: 96% 95% 96% 93%  Weight:      Height:        Intake/Output Summary (Last 24 hours) at 10/18/2020 1405 Last data filed at 10/18/2020 0644 Gross per 24 hour  Intake 240 ml  Output 900 ml  Net -660 ml   Filed Weights   10/16/20 1944  Weight: 70.3 kg    Examination:  Physical Exam Vitals and nursing note reviewed.  Constitutional:      Appearance: She is not ill-appearing.  HENT:     Head: Normocephalic.  Eyes:     Conjunctiva/sclera: Conjunctivae normal.  Cardiovascular:     Rate and Rhythm: Normal rate and regular rhythm.  Pulmonary:     Effort: Pulmonary effort is normal. No respiratory distress.  Abdominal:     General: Abdomen is flat. There is no distension.  Musculoskeletal:        General: No swelling or tenderness.  Neurological:     Mental Status:  She is alert.     Comments: Not conversant today but did just wake up     Data Reviewed: I have personally reviewed following labs and imaging studies  CBC: Recent Labs  Lab 10/16/20 1525 10/17/20 0458 10/18/20 0357  WBC 14.8* 11.3* 7.0  NEUTROABS 13.4* 10.4* 5.7  HGB 10.3* 10.2* 9.4*  HCT 33.0* 33.7* 30.4*  MCV 88.0 88.9 88.4  PLT 234 228 244   Basic Metabolic Panel: Recent Labs  Lab 10/16/20 1525 10/17/20 0458 10/18/20 0357  NA 143 146* 147*  K 3.7 3.9 3.1*  CL 104 107 107  CO2 27 26 25   GLUCOSE 53* 74 303*  BUN 44* 44* 48*  CREATININE 0.71 0.64 0.76  CALCIUM 8.7* 8.7* 8.5*   GFR: Estimated Creatinine Clearance: 58.8 mL/min (by C-G formula based on SCr of 0.76 mg/dL). Liver Function Tests: Recent Labs  Lab 10/16/20 1525 10/17/20 0458 10/18/20 0357  AST 38 35 31  ALT 36 35 38  ALKPHOS 107 112 117  BILITOT 0.2* 0.4 0.5  PROT 5.7* 5.7* 5.3*  ALBUMIN 2.3* 2.4* 2.3*   No results for input(s): LIPASE, AMYLASE in the last 168 hours. No results for input(s): AMMONIA in the last 168 hours. Coagulation Profile: No results for input(s): INR, PROTIME in the last 168 hours. Cardiac Enzymes: No results for input(s): CKTOTAL, CKMB, CKMBINDEX, TROPONINI in the last 168 hours. BNP (last 3 results) No results for input(s): PROBNP in the last 8760 hours. HbA1C: No results for input(s): HGBA1C in the last 72 hours. CBG: No results for input(s): GLUCAP in the last 168 hours. Lipid Profile: Recent Labs    10/16/20 1525  TRIG 116   Thyroid Function Tests: No results for input(s): TSH, T4TOTAL, FREET4, T3FREE, THYROIDAB in the last 72 hours. Anemia Panel: Recent Labs    10/17/20 0458 10/18/20 0357  FERRITIN 329* 331*   Sepsis Labs: Recent Labs  Lab 10/16/20 1525  PROCALCITON 0.83  LATICACIDVEN 1.2    Recent Results (from the past 240 hour(s))  Blood Culture (routine x 2)     Status:  None (Preliminary result)   Collection Time: 10/16/20  3:25 PM    Specimen: BLOOD LEFT FOREARM  Result Value Ref Range Status   Specimen Description   Final    BLOOD LEFT FOREARM Performed at [facility], [address] [place], [place] [address]    Special Requests   Final    BOTTLES DRAWN AEROBIC AND ANAEROBIC Blood Culture adequate volume Performed at [facility], [address] [place], [place] [address]    Culture   Final    NO GROWTH 2 DAYS Performed at [facility], [address] [place], [place] [address]    Report Status PENDING  Incomplete  Blood Culture (routine x 2)     Status: None (Preliminary result)   Collection Time: 10/16/20  3:30 PM   Specimen: BLOOD  Result Value Ref Range Status   Specimen Description   Final    BLOOD LEFT ANTECUBITAL Performed at [facility], [address] [place], [place] [address]    Special Requests   Final    BOTTLES DRAWN AEROBIC AND ANAEROBIC Blood Culture adequate volume Performed at [facility], [address] [place], [place] [address]    Culture  Setup Time   Final    GRAM POSITIVE COCCI IN CLUSTERS ANAEROBIC BOTTLE ONLY CRITICAL RESULT CALLED TO, READ BACK BY AND VERIFIED WITH: PHARMD N GLOGOVAC AT 1510 10/17/2020 BY L BENFIELD Performed at [facility], [address] [place], [place] [address]    Culture GRAM POSITIVE COCCI  Final   Report Status PENDING  Incomplete  Blood Culture ID Panel (Reflexed)     Status: Abnormal   Collection Time: 10/16/20  3:30 PM  Result Value Ref Range Status   Enterococcus faecalis NOT DETECTED NOT DETECTED Final   Enterococcus Faecium NOT DETECTED NOT DETECTED Final   Listeria monocytogenes NOT DETECTED NOT DETECTED Final   Staphylococcus species DETECTED (A) NOT DETECTED Final    Comment: CRITICAL RESULT CALLED TO, READ BACK BY AND VERIFIED WITH: PHARMD N GLOGOVAC AT 1510 10/17/2020 BY L BENFIELD    Staphylococcus aureus (BCID) NOT DETECTED NOT DETECTED  Final   Staphylococcus epidermidis NOT DETECTED NOT DETECTED Final   Staphylococcus lugdunensis NOT DETECTED NOT DETECTED Final   Streptococcus species NOT DETECTED NOT DETECTED Final   Streptococcus agalactiae NOT DETECTED NOT DETECTED Final   Streptococcus pneumoniae NOT DETECTED NOT DETECTED Final   Streptococcus pyogenes NOT DETECTED NOT DETECTED Final   A.calcoaceticus-baumannii NOT DETECTED NOT DETECTED Final   Bacteroides fragilis NOT DETECTED NOT DETECTED Final   Enterobacterales NOT DETECTED NOT DETECTED Final   Enterobacter cloacae complex NOT DETECTED NOT DETECTED Final   Escherichia coli NOT DETECTED NOT DETECTED Final   Klebsiella aerogenes NOT DETECTED NOT DETECTED Final   Klebsiella oxytoca NOT DETECTED NOT DETECTED Final   Klebsiella pneumoniae NOT DETECTED NOT DETECTED Final   Proteus species NOT DETECTED NOT DETECTED Final   Salmonella species NOT DETECTED NOT DETECTED Final   Serratia marcescens NOT DETECTED NOT DETECTED Final   Haemophilus influenzae NOT DETECTED NOT DETECTED Final   Neisseria meningitidis NOT DETECTED NOT DETECTED Final   Pseudomonas aeruginosa NOT DETECTED NOT DETECTED Final   Stenotrophomonas maltophilia NOT DETECTED NOT DETECTED Final   Candida albicans NOT DETECTED NOT DETECTED Final   Candida auris NOT DETECTED NOT DETECTED Final   Candida glabrata NOT DETECTED NOT DETECTED Final   Candida krusei NOT DETECTED NOT DETECTED Final  Candida parapsilosis NOT DETECTED NOT DETECTED Final   Candida tropicalis NOT DETECTED NOT DETECTED Final   Cryptococcus neoformans/gattii NOT DETECTED NOT DETECTED Final    Comment: Performed at [facility], [address] [place], [place] [address]  MRSA PCR Screening     Status: Abnormal   Collection Time: 10/16/20  9:08 PM   Specimen: Nasopharyngeal  Result Value Ref Range Status   MRSA by PCR POSITIVE (A) NEGATIVE Final    Comment:        The GeneXpert MRSA Assay (FDA approved for NASAL  specimens only), is one component of a comprehensive MRSA colonization surveillance program. It is not intended to diagnose MRSA infection nor to guide or monitor treatment for MRSA infections. CRITICAL RESULT CALLED TO, READ BACK BY AND VERIFIED WITH: PAM HAMILTON RN 10/16/2020 @2249  BY P.HENDERSON Performed at [facility], [address] [place], [place]          Radiology Studies: CT ANGIO CHEST PE W OR WO CONTRAST  Result Date: 10/17/2020 CLINICAL DATA:  Positive D-dimer. Clinical suspicion for pulmonary embolus. EXAM: CT ANGIOGRAPHY CHEST WITH CONTRAST TECHNIQUE: Multidetector CT imaging of the chest was performed using the standard protocol during bolus administration of intravenous contrast. Multiplanar CT image reconstructions and MIPs were obtained to evaluate the vascular anatomy. CONTRAST:  36mL OMNIPAQUE IOHEXOL 350 MG/ML SOLN COMPARISON:  Chest radiograph, 10/16/2020 and earlier exams. FINDINGS: Cardiovascular: Pulmonary arteries are well opacified. There is no evidence of a pulmonary embolism. Heart is normal in size and configuration. No pericardial effusion. No coronary artery calcifications. Great vessels are normal in caliber. Mild aortic atherosclerosis. No dissection. Mediastinum/Nodes: Unremarkable thyroid. No neck base or axillary masses or enlarged lymph nodes. There are subcentimeter mediastinal lymph nodes and hilar lymph nodes. No largest node is a right subcarinal node measuring 12 mm in short axis. No mediastinal or hilar masses. Trachea and esophagus are unremarkable. Lungs/Pleura: Trace pleural effusions. Bilateral patchy areas ground-glass and more confluent airspace opacity involving both lungs and all lobes. Additional opacity is noted in dependent aspects of the lower lobes consistent with atelectasis. No evidence of pulmonary edema. No pneumothorax. Upper Abdomen: No acute findings. Intermediate attenuation material noted in the  gallbladder which may reflect stones or sludge. Musculoskeletal: No fracture or acute finding.  No bone lesion. Review of the MIP images confirms the above findings. IMPRESSION: 1. No evidence of a pulmonary embolism. 2. Bilateral airspace lung opacities consistent with multifocal pneumonia, compatible with COVID-19 infection. Additional dependent lower lobe atelectasis with trace pleural effusions. Aortic Atherosclerosis (ICD10-I70.0). Electronically Signed   By: 10/18/2020 M.D.   On: 10/17/2020 11:42   DG Chest Port 1 View  Result Date: 10/16/2020 CLINICAL DATA:  Weakness, COVID-19 positive EXAM: PORTABLE CHEST 1 VIEW COMPARISON:  09/06/2019 FINDINGS: Heart size within normal limits. Loop recorder device. Patchy airspace opacities within the mid to lower lung fields bilaterally. No pleural effusion or pneumothorax. IMPRESSION: Patchy airspace opacities within the mid to lower lung fields bilaterally compatible with multifocal atypical/viral pneumonia. Electronically Signed   By: 14/01/2019 D.O.   On: 10/16/2020 15:39        Scheduled Meds: . amLODipine  5 mg Oral Daily  . aspirin  325 mg Oral Daily  . Chlorhexidine Gluconate Cloth  6 each Topical Q0600  . DULoxetine  60 mg Oral Daily  . enoxaparin (LOVENOX) injection  40 mg Subcutaneous Q24H  . feeding supplement  237 mL Oral BID BM  .  insulin aspart  0-9 Units Subcutaneous TID WC  . insulin glargine  28 Units Subcutaneous QHS  . Ipratropium-Albuterol  1 puff Inhalation Q6H  . methylPREDNISolone (SOLU-MEDROL) injection  1 mg/kg Intravenous Q12H   Followed by  . [START ON 10/20/2020] predniSONE  50 mg Oral Daily  . multivitamin with minerals  1 tablet Oral Daily  . mupirocin ointment  1 application Nasal BID  . sodium chloride flush  3 mL Intravenous Q12H   Continuous Infusions: . sodium chloride    . azithromycin 500 mg (10/18/20 1147)  . cefTRIAXone (ROCEPHIN)  IV 1 g (10/18/20 1355)  . dextrose    . remdesivir 100 mg in  NS 100 mL 100 mg (10/18/20 0820)  . vancomycin 1,500 mg (10/18/20 0902)     Time spent: 25 minutes with over 50% of the time coordinating the patient's care    Jae Dire, DO [facility]   Call night coverage person covering after 7pm

## 2020-10-19 LAB — COMPREHENSIVE METABOLIC PANEL
ALT: 41 U/L (ref 0–44)
AST: 29 U/L (ref 15–41)
Albumin: 2.4 g/dL — ABNORMAL LOW (ref 3.5–5.0)
Alkaline Phosphatase: 157 U/L — ABNORMAL HIGH (ref 38–126)
Anion gap: 14 (ref 5–15)
BUN: 42 mg/dL — ABNORMAL HIGH (ref 8–23)
CO2: 24 mmol/L (ref 22–32)
Calcium: 8.9 mg/dL (ref 8.9–10.3)
Chloride: 110 mmol/L (ref 98–111)
Creatinine, Ser: 0.7 mg/dL (ref 0.44–1.00)
GFR, Estimated: >60 mL/min (ref >60)
Glucose, Bld: 447 mg/dL — ABNORMAL HIGH (ref 70–99)
Potassium: 3.3 mmol/L — ABNORMAL LOW (ref 3.5–5.1)
Sodium: 148 mmol/L — ABNORMAL HIGH (ref 135–145)
Total Bilirubin: 0.3 mg/dL (ref 0.3–1.2)
Total Protein: 5.6 g/dL — ABNORMAL LOW (ref 6.5–8.1)

## 2020-10-19 LAB — CBC WITH DIFFERENTIAL/PLATELET
Abs Immature Granulocytes: 0.73 10*3/uL — ABNORMAL HIGH (ref 0.00–0.07)
Basophils Absolute: 0.1 10*3/uL (ref 0.0–0.1)
Basophils Relative: 1 %
Eosinophils Absolute: 0.1 10*3/uL (ref 0.0–0.5)
Eosinophils Relative: 1 %
HCT: 31.7 % — ABNORMAL LOW (ref 36.0–46.0)
Hemoglobin: 9.6 g/dL — ABNORMAL LOW (ref 12.0–15.0)
Immature Granulocytes: 10 %
Lymphocytes Relative: 5 %
Lymphs Abs: 0.3 10*3/uL — ABNORMAL LOW (ref 0.7–4.0)
MCH: 26.7 pg (ref 26.0–34.0)
MCHC: 30.3 g/dL (ref 30.0–36.0)
MCV: 88.1 fL (ref 80.0–100.0)
Monocytes Absolute: 0.3 10*3/uL (ref 0.1–1.0)
Monocytes Relative: 3 %
Neutro Abs: 6 10*3/uL (ref 1.7–7.7)
Neutrophils Relative %: 80 %
Platelets: 270 10*3/uL (ref 150–400)
RBC: 3.6 MIL/uL — ABNORMAL LOW (ref 3.87–5.11)
RDW: 16.1 % — ABNORMAL HIGH (ref 11.5–15.5)
WBC: 7.5 10*3/uL (ref 4.0–10.5)
nRBC: 0.8 % — ABNORMAL HIGH (ref 0.0–0.2)

## 2020-10-19 LAB — GLUCOSE, CAPILLARY
Glucose-Capillary: 356 mg/dL — ABNORMAL HIGH (ref 70–99)
Glucose-Capillary: 554 mg/dL (ref 70–99)
Glucose-Capillary: 351 mg/dL — ABNORMAL HIGH (ref 70–99)
Glucose-Capillary: 199 mg/dL — ABNORMAL HIGH (ref 70–99)

## 2020-10-19 LAB — C-REACTIVE PROTEIN: CRP: 14.4 mg/dL — ABNORMAL HIGH (ref <1.0)

## 2020-10-19 LAB — CULTURE, BLOOD (ROUTINE X 2): Special Requests: ADEQUATE

## 2020-10-19 LAB — D-DIMER, QUANTITATIVE: D-Dimer, Quant: 2.45 ug/mL-FEU — ABNORMAL HIGH (ref 0.00–0.50)

## 2020-10-19 LAB — FERRITIN: Ferritin: 218 ng/mL (ref 11–307)

## 2020-10-19 MED ORDER — DEXTROSE 5 % IV SOLN: INTRAVENOUS | Status: DC

## 2020-10-19 MED ORDER — INSULIN ASPART 100 UNIT/ML ~~LOC~~ SOLN
10.0000 [IU] | Freq: Three times a day (TID) | SUBCUTANEOUS | Status: DC
  Administered 2020-10-19 – 2020-10-21 (×8): 10 [IU] via SUBCUTANEOUS

## 2020-10-19 MED ORDER — POTASSIUM CHLORIDE CRYS ER 20 MEQ PO TBCR
20.0000 meq | EXTENDED_RELEASE_TABLET | Freq: Once | ORAL | Status: AC
  Administered 2020-10-19: 20 meq via ORAL
  Filled 2020-10-19: qty 1

## 2020-10-19 MED ORDER — INSULIN ASPART 100 UNIT/ML ~~LOC~~ SOLN
10.0000 [IU] | Freq: Once | SUBCUTANEOUS | Status: AC
  Administered 2020-10-19: 10 [IU] via SUBCUTANEOUS

## 2020-10-19 MED ORDER — PREDNISONE 50 MG PO TABS
50.0000 mg | ORAL_TABLET | Freq: Every day | ORAL | Status: DC
  Administered 2020-10-20 – 2020-10-22 (×3): 50 mg via ORAL
  Filled 2020-10-19 (×3): qty 1

## 2020-10-19 MED ORDER — METHYLPREDNISOLONE SODIUM SUCC 40 MG IJ SOLR
0.5000 mg/kg | Freq: Two times a day (BID) | INTRAMUSCULAR | Status: AC
  Administered 2020-10-19: 35.2 mg via INTRAVENOUS
  Filled 2020-10-19: qty 1

## 2020-10-19 NOTE — Progress Notes (Signed)
PROGRESS NOTE    Janice Little    Code Status: Partial Code  QQP:[id number] DOB: 12-17-1945 DOA: 10/16/2020 LOS: 3 days  PCP: Juluis Rainier, MD CC:  Chief Complaint  Patient presents with  . Altered Mental Status  . 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  She was diagnosed with COVID-19 on 1/10 and had been having increasing weakness and confusion since.  Found to be afebrile, tachycardic, tachypneic, hypoxic requiring 4 to 5 L/min O2 with presenting labs: Sodium 143, K3.7, glucose 53, BUN 44, creatinine 0.71, LDH 329, triglycerides 116, ferritin 346, CRP 35, lactic acid 1.2, procalcitonin 0.83, WBC 14.8, Hb 10.3, platelets 234, D-dimer 2.62, fibrinogen >800. Notable Imaging: CXR-patchy airspace opacity in mid to lower lung fields bilaterally. Patient received dexamethasone, remdesivir, dextrose 5%.     A & P   Principal Problem:   Acute hypoxemic respiratory failure due to COVID-19 [facility]) Active Problems:   Type 2 diabetes mellitus with hypoglycemia without coma (HCC)   Essential hypertension   Elevated C-reactive protein (CRP)   Acute sepsis (HCC)   Acute metabolic encephalopathy   1. Sepsis without septic shock  Acute hypoxic respiratory failure secondary to COVID-19 and suspected aspiration pneumonia a. Sepsis criteria on admission: Tachycardia, tachypnea, leukocytosis, hypoxia. Sepsis has resolved b. On 1 L/min today c. CRP finally trending downward, currently 34.9-> 36.9-> 24->14. Baricitinib held due to bacterial infection d. D-dimer stable at 2.4, CTA chest negative for PE  e. SLP consulted f. Day 4/10 steroids. Decrease solumedrol to 0.5 mg/kg today given hyperglycemia and improving  inflammatory markers and O2  g. Day 4/5 remdesivir h. Day 3/5 antibiotics. Continue Ceftriaxone and Azithromycin, stop Vancomycin i. Continue inhalers j. Palliative care consulted for GOC discussion  2. Acute metabolic encephalopathy, multifactorial: Hypoxia, hypoglycemia, sepsis a. Palliative care consulted  3. Hypernatremia a. 1.8 L free water deficit b. Hyperglycemia with D5W - hold further fluids and follow up c. Encourage free water intake  4. Hypertension a. Continue amlodipine b. As needed labetalol  5. Hypoglycemia, now hyperglycemia in the setting of type 2 diabetes a. Increase novolog to 10 units TID with meals b. Continue Levemir, linagliptin and sliding scale  6. 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 Bedbound at baseline b. Continue home aspirin    DVT prophylaxis: enoxaparin (LOVENOX) injection 40 mg Start: 10/16/20 2000   Family Communication: [name] has been updated today  Disposition Plan:  Status is: Inpatient  Remains inpatient appropriate because:IV treatments appropriate due to intensity of illness or inability to take PO and Inpatient level of care appropriate due to severity of illness   Dispo: The patient is from: SNF              Anticipated d/c is to: SNF              Anticipated d/c date is: 2 days              Patient currently is not medically stable to d/c.           Pressure injury documentation   Pressure Injury 02/20/19 Other (Comment) Upper Stage II -  Partial thickness loss of dermis presenting as a shallow open ulcer with a  red, pink wound bed without slough. (Active)  02/20/19 2030  Location: Other (Comment)  Location Orientation: Upper  Staging: Stage II -  Partial thickness loss of dermis presenting as a shallow open ulcer with a red, pink wound bed without slough.  Wound Description (Comments):   Present on Admission: No     Pressure Injury 09/16/19 Sacrum Posterior Stage I  -  Intact skin with non-blanchable redness of a localized area usually over a bony prominence. (Active)  09/16/19 1830  Location: Sacrum  Location Orientation: Posterior  Staging: Stage I -  Intact skin with non-blanchable redness of a localized area usually over a bony prominence.  Wound Description (Comments):   Present on Admission:      Pressure Injury 09/16/19 Heel Right;Left Stage I -  Intact skin with non-blanchable redness of a localized area usually over a bony prominence. (Active)  09/16/19 1830  Location: Heel  Location Orientation: Right;Left  Staging: Stage I -  Intact skin with non-blanchable redness of a localized area usually over a bony prominence.  Wound Description (Comments):   Present on Admission: Yes   Consultants  palliative  Procedures  None  Antibiotics   Anti-infectives (From admission, onward)   Start     Dose/Rate Route Frequency Ordered Stop   10/18/20 1000  vancomycin (VANCOREADY) IVPB 1500 mg/300 mL        1,500 mg 150 mL/hr over 120 Minutes Intravenous Every 24 hours 10/17/20 0957     10/17/20 1200  cefTRIAXone (ROCEPHIN) 1 g in sodium chloride 0.9 % 100 mL IVPB        1 g 200 mL/hr over 30 Minutes Intravenous Every 24 hours 10/17/20 1030     10/17/20 1130  azithromycin (ZITHROMAX) 500 mg in sodium chloride 0.9 % 250 mL IVPB        500 mg 250 mL/hr over 60 Minutes Intravenous Every 24 hours 10/17/20 1030     10/17/20 1000  remdesivir 100 mg in sodium chloride 0.9 % 100 mL IVPB       "Followed by" Linked Group Details   100 mg 200 mL/hr over 30 Minutes Intravenous Daily 10/16/20 1707 10/21/20 0959   10/17/20 0900  vancomycin (VANCOREADY) IVPB 1750 mg/350 mL        1,750 mg 175 mL/hr over 120 Minutes Intravenous  Once 10/17/20 0759 10/17/20 1338   10/16/20 1800  remdesivir 200 mg in sodium chloride 0.9% 250 mL IVPB       "Followed by" Linked Group Details   200 mg 580 mL/hr over 30 Minutes Intravenous Once 10/16/20 1707 10/16/20 1854    10/16/20 1800  cefTRIAXone (ROCEPHIN) 1 g in sodium chloride 0.9 % 100 mL IVPB  Status:  Discontinued        1 g 200 mL/hr over 30 Minutes Intravenous Every 24 hours 10/16/20 1747 10/17/20 1030   10/16/20 1800  azithromycin (ZITHROMAX) 500 mg in sodium chloride 0.9 % 250 mL IVPB  Status:  Discontinued        500 mg 250 mL/hr over 60 Minutes Intravenous Every 24 hours 10/16/20 1747 10/17/20 1030        Subjective    Seen and examined at bedside no acute distress and resting comfortably.  Patient was not speaking much today but did not have any complaints.  Objective   Vitals:   10/18/20 1020 10/18/20 1307 10/18/20 2045 10/19/20 0517  BP:  (!) 166/81 (!) 165/87 (!) 150/86  Pulse:  (!) 116 (!) 120 (!) 122  Resp:   20 20  Temp:  99.4 F (37.4 C) 99 F (37.2 C) 99.6 F (37.6 C)  TempSrc:      SpO2: 96% 93% 93% 93%  Weight:      Height:        Intake/Output Summary (Last 24 hours) at 10/19/2020 0745 Last data filed at 10/19/2020 0530 Gross per 24 hour  Intake --  Output 1250 ml  Net -1250 ml   Filed Weights   10/16/20 1944  Weight: 70.3 kg    Examination:  Physical Exam Vitals and nursing note reviewed.  Constitutional:      General: She is not in acute distress. HENT:     Head: Normocephalic.  Eyes:     Conjunctiva/sclera: Conjunctivae normal.  Cardiovascular:     Rate and Rhythm: Normal rate and regular rhythm.  Pulmonary:     Effort: Pulmonary effort is normal. No respiratory distress.     Comments: 1 L/min O2 Musculoskeletal:        General: No swelling or tenderness.  Neurological:     Mental Status: She is alert. Mental status is at baseline.  Psychiatric:        Mood and Affect: Mood normal.     Data Reviewed: I have personally reviewed following labs and imaging studies  CBC: Recent Labs  Lab 10/16/20 1525 10/17/20 0458 10/18/20 0357 10/19/20 0353  WBC 14.8* 11.3* 7.0 7.5  NEUTROABS 13.4* 10.4* 5.7 6.0  HGB 10.3* 10.2* 9.4* 9.6*  HCT  33.0* 33.7* 30.4* 31.7*  MCV 88.0 88.9 88.4 88.1  PLT 234 228 244 270   Basic Metabolic Panel: Recent Labs  Lab 10/16/20 1525 10/17/20 0458 10/18/20 0357 10/19/20 0353  NA 143 146* 147* 148*  K 3.7 3.9 3.1* 3.3*  CL 104 107 107 110  CO2 27 26 25 24   GLUCOSE 53* 74 303* 447*  BUN 44* 44* 48* 42*  CREATININE 0.71 0.64 0.76 0.70  CALCIUM 8.7* 8.7* 8.5* 8.9   GFR: Estimated Creatinine Clearance: 58.8 mL/min (by C-G formula based on SCr of 0.7 mg/dL). Liver Function Tests: Recent Labs  Lab 10/16/20 1525 10/17/20 0458 10/18/20 0357 10/19/20 0353  AST 38 35 31 29  ALT 36 35 38 41  ALKPHOS 107 112 117 157*  BILITOT 0.2* 0.4 0.5 0.3  PROT 5.7* 5.7* 5.3* 5.6*  ALBUMIN 2.3* 2.4* 2.3* 2.4*   No results for input(s): LIPASE, AMYLASE in the last 168 hours. No results for input(s): AMMONIA in the last 168 hours. Coagulation Profile: No results for input(s): INR, PROTIME in the last 168 hours. Cardiac Enzymes: No results for input(s): CKTOTAL, CKMB, CKMBINDEX, TROPONINI in the last 168 hours. BNP (last 3 results) No results for input(s): PROBNP in the last 8760 hours. HbA1C: No results for input(s): HGBA1C in the last 72 hours. CBG: Recent Labs  Lab 10/18/20 1646 10/18/20 1647 10/18/20 2042 10/19/20 0740  GLUCAP >600* 591* 455* 356*   Lipid Profile: Recent Labs    10/16/20 1525  TRIG 116   Thyroid Function Tests: No results for input(s): TSH, T4TOTAL, FREET4, T3FREE, THYROIDAB in the last 72 hours. Anemia Panel: Recent Labs    10/18/20 0357 10/19/20 0353  FERRITIN 331* 218   Sepsis Labs: Recent Labs  Lab 10/16/20 1525  PROCALCITON 0.83  LATICACIDVEN 1.2    Recent Results (from the past 240 hour(s))  Blood Culture (routine x 2)     Status: None (Preliminary result)   Collection Time: 10/16/20  3:25 PM  Specimen: BLOOD LEFT FOREARM  Result Value Ref Range Status   Specimen Description   Final    BLOOD LEFT FOREARM Performed at [facility], [address] [place], [place] [address]    Special Requests   Final    BOTTLES DRAWN AEROBIC AND ANAEROBIC Blood Culture adequate volume Performed at [facility], [address] [place], [place] [address]    Culture   Final    NO GROWTH 2 DAYS Performed at [facility], [address] [place], [place] [address]    Report Status PENDING  Incomplete  Blood Culture (routine x 2)     Status: Abnormal (Preliminary result)   Collection Time: 10/16/20  3:30 PM   Specimen: BLOOD  Result Value Ref Range Status   Specimen Description   Final    BLOOD LEFT ANTECUBITAL Performed at [facility], [address] [place], [place] [address]    Special Requests   Final    BOTTLES DRAWN AEROBIC AND ANAEROBIC Blood Culture adequate volume Performed at [facility], [address] [place], [place] [address]    Culture  Setup Time   Final    GRAM POSITIVE COCCI IN CLUSTERS ANAEROBIC BOTTLE ONLY CRITICAL RESULT CALLED TO, READ BACK BY AND VERIFIED WITH: PHARMD N GLOGOVAC AT 1510 10/17/2020 BY L BENFIELD    Culture (A)  Final    STAPHYLOCOCCUS HOMINIS THE SIGNIFICANCE OF ISOLATING THIS ORGANISM FROM A SINGLE SET OF BLOOD CULTURES WHEN MULTIPLE SETS ARE DRAWN IS UNCERTAIN. PLEASE NOTIFY THE MICROBIOLOGY DEPARTMENT WITHIN ONE WEEK IF SPECIATION AND SENSITIVITIES ARE REQUIRED. Performed at [facility], [address] [place], [place] [address]    Report Status PENDING  Incomplete  Blood Culture ID Panel (Reflexed)     Status: Abnormal   Collection Time: 10/16/20  3:30 PM  Result Value Ref Range Status   Enterococcus faecalis NOT DETECTED NOT DETECTED Final   Enterococcus Faecium NOT DETECTED NOT DETECTED Final   Listeria monocytogenes NOT DETECTED NOT DETECTED Final   Staphylococcus species DETECTED (A) NOT DETECTED Final    Comment: CRITICAL RESULT CALLED TO, READ BACK BY AND VERIFIED WITH: PHARMD N GLOGOVAC AT  1510 10/17/2020 BY L BENFIELD    Staphylococcus aureus (BCID) NOT DETECTED NOT DETECTED Final   Staphylococcus epidermidis NOT DETECTED NOT DETECTED Final   Staphylococcus lugdunensis NOT DETECTED NOT DETECTED Final   Streptococcus species NOT DETECTED NOT DETECTED Final   Streptococcus agalactiae NOT DETECTED NOT DETECTED Final   Streptococcus pneumoniae NOT DETECTED NOT DETECTED Final   Streptococcus pyogenes NOT DETECTED NOT DETECTED Final   A.calcoaceticus-baumannii NOT DETECTED NOT DETECTED Final   Bacteroides fragilis NOT DETECTED NOT DETECTED Final   Enterobacterales NOT DETECTED NOT DETECTED Final   Enterobacter cloacae complex NOT DETECTED NOT DETECTED Final   Escherichia coli NOT DETECTED NOT DETECTED Final   Klebsiella aerogenes NOT DETECTED NOT DETECTED Final   Klebsiella oxytoca NOT DETECTED NOT DETECTED Final   Klebsiella pneumoniae NOT DETECTED NOT DETECTED Final   Proteus species NOT DETECTED NOT DETECTED Final   Salmonella species NOT DETECTED NOT DETECTED Final   Serratia marcescens NOT DETECTED NOT DETECTED Final   Haemophilus influenzae NOT DETECTED NOT DETECTED Final   Neisseria meningitidis NOT DETECTED NOT DETECTED Final   Pseudomonas aeruginosa NOT DETECTED NOT DETECTED Final   Stenotrophomonas maltophilia NOT DETECTED NOT DETECTED Final   Candida albicans NOT DETECTED NOT DETECTED Final   Candida  auris NOT DETECTED NOT DETECTED Final   Candida glabrata NOT DETECTED NOT DETECTED Final   Candida krusei NOT DETECTED NOT DETECTED Final   Candida parapsilosis NOT DETECTED NOT DETECTED Final   Candida tropicalis NOT DETECTED NOT DETECTED Final   Cryptococcus neoformans/gattii NOT DETECTED NOT DETECTED Final    Comment: Performed at [facility], [address], [place], [place] [address]  MRSA PCR Screening     Status: Abnormal   Collection Time: 10/16/20  9:08 PM   Specimen: Nasopharyngeal  Result Value Ref Range Status   MRSA by PCR POSITIVE (A) NEGATIVE  Final    Comment:        The GeneXpert MRSA Assay (FDA approved for NASAL specimens only), is one component of a comprehensive MRSA colonization surveillance program. It is not intended to diagnose MRSA infection nor to guide or monitor treatment for MRSA infections. CRITICAL RESULT CALLED TO, READ BACK BY AND VERIFIED WITH: PAM HAMILTON RN 10/16/2020 @2249  BY P.HENDERSON Performed at [facility], [address], [place], [place]          Radiology Studies: CT ANGIO CHEST PE W OR WO CONTRAST  Result Date: 10/17/2020 CLINICAL DATA:  Positive D-dimer. Clinical suspicion for pulmonary embolus. EXAM: CT ANGIOGRAPHY CHEST WITH CONTRAST TECHNIQUE: Multidetector CT imaging of the chest was performed using the standard protocol during bolus administration of intravenous contrast. Multiplanar CT image reconstructions and MIPs were obtained to evaluate the vascular anatomy. CONTRAST:  19mL OMNIPAQUE IOHEXOL 350 MG/ML SOLN COMPARISON:  Chest radiograph, 10/16/2020 and earlier exams. FINDINGS: Cardiovascular: Pulmonary arteries are well opacified. There is no evidence of a pulmonary embolism. Heart is normal in size and configuration. No pericardial effusion. No coronary artery calcifications. Great vessels are normal in caliber. Mild aortic atherosclerosis. No dissection. Mediastinum/Nodes: Unremarkable thyroid. No neck base or axillary masses or enlarged lymph nodes. There are subcentimeter mediastinal lymph nodes and hilar lymph nodes. No largest node is a right subcarinal node measuring 12 mm in short axis. No mediastinal or hilar masses. Trachea and esophagus are unremarkable. Lungs/Pleura: Trace pleural effusions. Bilateral patchy areas ground-glass and more confluent airspace opacity involving both lungs and all lobes. Additional opacity is noted in dependent aspects of the lower lobes consistent with atelectasis. No evidence of pulmonary edema. No pneumothorax. Upper  Abdomen: No acute findings. Intermediate attenuation material noted in the gallbladder which may reflect stones or sludge. Musculoskeletal: No fracture or acute finding.  No bone lesion. Review of the MIP images confirms the above findings. IMPRESSION: 1. No evidence of a pulmonary embolism. 2. Bilateral airspace lung opacities consistent with multifocal pneumonia, compatible with COVID-19 infection. Additional dependent lower lobe atelectasis with trace pleural effusions. Aortic Atherosclerosis (ICD10-I70.0). Electronically Signed   By: 10/18/2020 M.D.   On: 10/17/2020 11:42        Scheduled Meds: . amLODipine  5 mg Oral Daily  . aspirin  325 mg Oral Daily  . Chlorhexidine Gluconate Cloth  6 each Topical Q0600  . DULoxetine  60 mg Oral Daily  . enoxaparin (LOVENOX) injection  40 mg Subcutaneous Q24H  . feeding supplement  237 mL Oral BID BM  . insulin aspart  0-20 Units Subcutaneous TID WC  . insulin aspart  10 Units Subcutaneous TID WC  . insulin detemir  0.15 Units/kg Subcutaneous BID  . Ipratropium-Albuterol  1 puff Inhalation Q6H  . linagliptin  5 mg Oral Daily  . methylPREDNISolone (SOLU-MEDROL) injection  1 mg/kg Intravenous Q12H  Followed by  . [START ON 10/20/2020] predniSONE  50 mg Oral Daily  . multivitamin with minerals  1 tablet Oral Daily  . mupirocin ointment  1 application Nasal BID  . sodium chloride flush  3 mL Intravenous Q12H   Continuous Infusions: . sodium chloride    . azithromycin 500 mg (10/18/20 1147)  . cefTRIAXone (ROCEPHIN)  IV 1 g (10/18/20 1355)  . remdesivir 100 mg in NS 100 mL 100 mg (10/18/20 0820)  . vancomycin 1,500 mg (10/18/20 0902)     Time spent: 26 minutes with over 50% of the time coordinating the patient's care    Jae Dire, DO [facility]   Call night coverage person covering after 7pm

## 2020-10-19 NOTE — Plan of Care (Signed)

## 2020-10-19 NOTE — [place] FL2 (Signed)
[place] MEDICAID FL2 LEVEL OF CARE SCREENING TOOL     IDENTIFICATION  Patient Name: Janice Little Birthdate: 1946-06-24 Sex: female Admission Date (Current Location): 10/16/2020  [facility] and [place] Number:  Producer, television/film/video and Address:  [facility],  [address] [place], [place] [address]      Provider Number: [id number]  Attending Physician Name and Address:  Jae Dire, MD  Relative Name and Phone Number:  Jerrye Bushy [facility] sister [phone], Eston Esters son [phone]    Current Level of Care: Hospital Recommended Level of Care: Skilled Nursing Facility Prior Approval Number:    Date Approved/Denied:   PASRR Number: [id number]  Discharge Plan: SNF    Current Diagnoses: Patient Active Problem List   Diagnosis Date Noted  . Acute hypoxemic respiratory failure due to COVID-19 (HCC) 10/16/2020  . Acute metabolic encephalopathy 10/16/2020  . Gross hematuria   . Acute sepsis (HCC)   . Sepsis secondary to UTI (HCC)   . Diarrhea   . Elevated sed rate 08/12/2019  . Elevated C-reactive protein (CRP) 08/12/2019  . Left-sided neglect 08/01/2019  . Myalgia 08/01/2019  . Pressure injury of skin 03/04/2019  . Acute lower UTI   . Stroke (HCC) 03/02/2019  . Acute CVA (cerebrovascular accident) (HCC) 03/01/2019  . Hypertensive urgency 12/19/2018  . History of myelitis 12/26/2017  . History of optic neuritis 04/02/2015  . Diplopia 04/02/2015  . Urinary incontinence 04/02/2015  . Other fatigue 04/02/2015  . 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 Numbness 04/02/2015  . Ataxic gait 04/02/2015  . Essential tremor 04/02/2015  . PRIMARY HYPERPARATHYROIDISM 02/01/2008  . OSTEOPOROSIS 02/01/2008  . Type 2 diabetes mellitus with hypoglycemia without coma (HCC) 01/31/2008  . Hyperlipidemia 01/31/2008  . Multiple sclerosis (HCC) 01/31/2008  . Optic neuritis 01/31/2008  . Essential hypertension 01/31/2008  . GERD  01/31/2008    Orientation RESPIRATION BLADDER Height & Weight     Self  O2 (2L O2 ) Incontinent Weight: 70.3 kg Height:  5\' 5"  (165.1 cm)  BEHAVIORAL SYMPTOMS/MOOD NEUROLOGICAL BOWEL NUTRITION STATUS      Incontinent Diet (Puree, thin liquids)  AMBULATORY STATUS COMMUNICATION OF NEEDS Skin   Total Care   Normal                       Personal Care Assistance Level of Assistance  Bathing,Feeding,Dressing,Total care Bathing Assistance: Maximum assistance Feeding assistance: Maximum assistance Dressing Assistance: Maximum assistance     Functional Limitations Info  Sight,Hearing,Speech Sight Info: Adequate Hearing Info: Adequate Speech Info: Adequate    SPECIAL CARE FACTORS FREQUENCY  PT (By licensed PT),OT (By licensed OT)     PT Frequency: Eval and Treat OT Frequency: Eval and Treat            Contractures Contractures Info: Not present    Additional Factors Info  Code Status,Allergies Code Status Info: FULL Allergies Info: No Known Allergies           Current Medications (10/19/2020):  This is the current hospital active medication list Current Facility-Administered Medications  Medication Dose Route Frequency Provider Last Rate Last Admin  . 0.9 %  sodium chloride infusion  250 mL Intravenous PRN 10/21/2020, MD      . acetaminophen (TYLENOL) tablet 650 mg  650 mg Oral Q6H PRN Jae Dire, MD      . amLODipine (NORVASC) tablet 5 mg  5 mg Oral Daily Jae Dire, MD  5 mg at 10/19/20 0919  . aspirin tablet 325 mg  325 mg Oral Daily Jae Dire, MD   325 mg at 10/19/20 0919  . azithromycin (ZITHROMAX) 500 mg in sodium chloride 0.9 % 250 mL IVPB  500 mg Intravenous Q24H Royce Macadamia, RPH 250 mL/hr at 10/19/20 1344 500 mg at 10/19/20 1344  . cefTRIAXone (ROCEPHIN) 1 g in sodium chloride 0.9 % 100 mL IVPB  1 g Intravenous Q24H Royce Macadamia, RPH 200 mL/hr at 10/19/20 1346 1 g at 10/19/20 1346  . Chlorhexidine Gluconate Cloth 2 % PADS 6  each  6 each Topical Q0600 Jae Dire, MD   6 each at 10/19/20 0518  . dextrose 5 % solution   Intravenous Continuous Jae Dire, MD 100 mL/hr at 10/19/20 1047 New Bag at 10/19/20 1047  . DULoxetine (CYMBALTA) DR capsule 60 mg  60 mg Oral Daily Jae Dire, MD   60 mg at 10/19/20 0919  . enoxaparin (LOVENOX) injection 40 mg  40 mg Subcutaneous Q24H Jae Dire, MD   40 mg at 10/18/20 2149  . feeding supplement (ENSURE ENLIVE / ENSURE PLUS) liquid 237 mL  237 mL Oral BID BM Jae Dire, MD   237 mL at 10/19/20 1346  . guaiFENesin-dextromethorphan (ROBITUSSIN DM) 100-10 MG/5ML syrup 10 mL  10 mL Oral Q4H PRN Jae Dire, MD   10 mL at 10/19/20 0919  . insulin aspart (novoLOG) injection 0-20 Units  0-20 Units Subcutaneous TID WC Jae Dire, MD   20 Units at 10/19/20 1208  . insulin aspart (novoLOG) injection 10 Units  10 Units Subcutaneous TID WC Jae Dire, MD   10 Units at 10/19/20 1207  . insulin detemir (LEVEMIR) injection 11 Units  0.15 Units/kg Subcutaneous BID Jae Dire, MD   11 Units at 10/19/20 1051  . Ipratropium-Albuterol (COMBIVENT) respimat 1 puff  1 puff Inhalation Q6H Jae Dire, MD   1 puff at 10/19/20 1346  . labetalol (NORMODYNE) injection 5 mg  5 mg Intravenous Q2H PRN Jae Dire, MD      . linagliptin (TRADJENTA) tablet 5 mg  5 mg Oral Daily Jae Dire, MD   5 mg at 10/19/20 0919  . lip balm (CARMEX) ointment 1 application  1 application Topical PRN Jae Dire, MD   1 application at 10/17/20 0124  . methylPREDNISolone sodium succinate (SOLU-MEDROL) 125 mg/2 mL injection 70 mg  1 mg/kg Intravenous Q12H Jae Dire, MD   70 mg at 10/19/20 0515   Followed by  . [START ON 10/20/2020] predniSONE (DELTASONE) tablet 50 mg  50 mg Oral Daily Jae Dire, MD      . multivitamin with minerals tablet 1 tablet  1 tablet Oral Daily Jae Dire, MD   1 tablet at 10/19/20 0919  . mupirocin ointment (BACTROBAN) 2 % 1 application  1 application  Nasal BID Jae Dire, MD   1 application at 10/19/20 [id number]  . remdesivir 100 mg in sodium chloride 0.9 % 100 mL IVPB  100 mg Intravenous Daily Jae Dire, MD 200 mL/hr at 10/19/20 0917 100 mg at 10/19/20 0917  . sodium chloride flush (NS) 0.9 % injection 3 mL  3 mL Intravenous Q12H Jae Dire, MD   3 mL at 10/19/20 0917  . sodium chloride flush (NS) 0.9 % injection 3 mL  3 mL Intravenous PRN Jae Dire, MD  Discharge Medications: Please see discharge summary for a list of discharge medications.  Relevant Imaging Results:  Relevant Lab Results:   Additional Information SS#[id number]  Geni Bers, RN

## 2020-10-19 NOTE — Consult Note (Signed)
Consultation Note Date: 10/19/2020   Patient Name: Janice Little  DOB: [birth year]  MRN: [id number]  Age / Sex: 75 y.o., female  PCP: Juluis Rainier, MD Referring Physician: Jae Dire, MD  Reason for Consultation: Establishing goals of care  HPI/Patient Profile: 75 y.o. female  with past medical history of type 2 DM, CVA, bedbound, MS, HTN, HLP, hyperparathyroidism, long term resident at [facility] admitted on 10/16/2020 with COVID 19 pneumonia as well as concern for aspiration.  Palliative consulted for goals of care.   Clinical Assessment and Goals of Care: I saw and examined Janice Little this morning.  She is not able to engage in conversation regarding goals of care  I called and was able to reach her sister.    I introduced palliative care as specialized medical care for people living with serious illness. It focuses on providing relief from the symptoms and stress of a serious illness. The goal is to improve quality of life for both the patient and the family.  Her sister relayed story of Janice Little's clinical course over the past few months and the significant changes to her cognition and functional status after having CVAs.  We discussed clinical course as well as wishes moving forward in regard to advanced directives.  Concepts specific to code status and care plan this hospitalization discussed.  Values and goals of care important to patient and family were attempted to be elicited.  Questions and concerns addressed.   PMT will continue to support holistically.  SUMMARY OF RECOMMENDATIONS   - Partial code - HCPOA paperwork reviewed that names her sister as her legal surrogate. - Discussed with patient sister.  She expressed understanding severity of her situation, but she is also hopeful as she has had some improvement in oxygen needs over the past 24 hours.  Sister desires to  continue current care plan.  "Let's just see how things go over the next couple days." - PMT will continue to follow.  Code Status/Advance Care Planning:  Limited code  Prognosis:   Guarded  Discharge Planning: To Be Determined      Primary Diagnoses: Present on Admission: . Acute hypoxemic respiratory failure due to COVID-19 (HCC) . Acute sepsis (HCC) . Elevated C-reactive protein (CRP) . Essential hypertension   I have reviewed the medical record, interviewed the patient and family, and examined the patient. The following aspects are pertinent.  Past Medical History:  Diagnosis Date  . Chronic kidney disease (CKD), stage II (mild)   . Chronic pain   . DM type 2 (diabetes mellitus, type 2) (HCC)   . Fatty liver    per CT  . GERD (gastroesophageal reflux disease)   . History of hepatitis as a child    age 44 (jaundice)  . HTN (hypertension)   . Hyperlipidemia   . MS (multiple sclerosis) (HCC)   . Obesity   . Osteopenia   . Primary hyperparathyroidism (HCC)    s/p surgery removal of parathyroid (Dr. Sharl Ma, Dr. Gerrit Friends)  . Vitamin D  deficiency    Social History   Socioeconomic History  . Marital status: Legally Separated    Spouse name: Not on file  . Number of children: Not on file  . Years of education: Not on file  . Highest education level: Not on file  Occupational History  . Not on file  Tobacco Use  . Smoking status: Never Smoker  . Smokeless tobacco: Never Used  Substance and Sexual Activity  . Alcohol use: No  . Drug use: No  . Sexual activity: Not on file  Other Topics Concern  . Not on file  Social History Narrative  . Not on file   Social Determinants of Health   Financial Resource Strain: Not on file  Food Insecurity: Not on file  Transportation Needs: Not on file  Physical Activity: Not on file  Stress: Not on file  Social Connections: Not on file   Family History  Problem Relation Age of Onset  . Mental illness Mother   . COPD  Father   . Lung cancer Father    Scheduled Meds: . amLODipine  5 mg Oral Daily  . aspirin  325 mg Oral Daily  . Chlorhexidine Gluconate Cloth  6 each Topical Q0600  . DULoxetine  60 mg Oral Daily  . enoxaparin (LOVENOX) injection  40 mg Subcutaneous Q24H  . feeding supplement  237 mL Oral BID BM  . insulin aspart  0-20 Units Subcutaneous TID WC  . insulin aspart  10 Units Subcutaneous TID WC  . insulin detemir  0.15 Units/kg Subcutaneous BID  . Ipratropium-Albuterol  1 puff Inhalation Q6H  . linagliptin  5 mg Oral Daily  . methylPREDNISolone (SOLU-MEDROL) injection  1 mg/kg Intravenous Q12H   Followed by  . [START ON 10/20/2020] predniSONE  50 mg Oral Daily  . multivitamin with minerals  1 tablet Oral Daily  . mupirocin ointment  1 application Nasal BID  . sodium chloride flush  3 mL Intravenous Q12H   Continuous Infusions: . sodium chloride    . azithromycin 500 mg (10/18/20 1147)  . cefTRIAXone (ROCEPHIN)  IV 1 g (10/18/20 1355)  . remdesivir 100 mg in NS 100 mL 100 mg (10/18/20 0820)  . vancomycin 1,500 mg (10/18/20 0902)   PRN Meds:.sodium chloride, acetaminophen, guaiFENesin-dextromethorphan, labetalol, lip balm, sodium chloride flush Medications Prior to Admission:  Prior to Admission medications   Medication Sig Start Date End Date Taking? Authorizing Provider  acetaminophen (TYLENOL) 325 MG tablet Take 2 tablets (650 mg total) by mouth every 4 (four) hours as needed for mild pain (or temp > 37.5 C (99.5 F)). 03/05/19  Yes Vassie Loll, MD  amLODipine (NORVASC) 5 MG tablet Take 5 mg by mouth daily. 07/24/19  Yes [provider]  aspirin 325 MG tablet Take 1 tablet (325 mg total) by mouth daily. 03/06/19  Yes Vassie Loll, MD  Cholecalciferol (VITAMIN D) 50 MCG (2000 UT) CAPS Take 2,000 Units by mouth daily.   Yes [provider]  DULoxetine (CYMBALTA) 60 MG capsule Take 60 mg by mouth daily.   Yes [provider]  gabapentin (NEURONTIN) 100 MG  capsule Take 100 mg by mouth 3 (three) times daily.   Yes [provider]  hydrALAZINE (APRESOLINE) 25 MG tablet Take 3 tablets (75 mg total) by mouth every 8 (eight) hours. 03/05/19  Yes Vassie Loll, MD  Infant [employer] [facility]) OINT Apply 1 application topically daily. Apply to buttocks.   Yes [provider]  insulin glargine (LANTUS)  100 UNIT/ML injection Inject 34 Units into the skin at bedtime.   Yes [provider]  insulin lispro (HUMALOG) 100 UNIT/ML injection Inject 2-11 Units into the skin 3 (three) times daily before meals. Sliding scale: 101-150=2 U 151-200=3 U 201-250=5 U 251-300=7 U 301-350=9 U >350=11 U  Call MD for BS>400 or <60   Yes [provider]  lisinopril (ZESTRIL) 40 MG tablet Take 40 mg by mouth daily.   Yes [provider]  Melatonin 3 MG TABS Take 3 mg by mouth at bedtime.    Yes [provider]  metFORMIN (GLUCOPHAGE-XR) 500 MG 24 hr tablet Take 1 tablet (500 mg total) by mouth 2 (two) times a day. 03/05/19  Yes Vassie Loll, MD  Multiple Vitamins-Minerals (CEROVITE SENIOR) TABS Take 1 tablet by mouth daily.   Yes [provider]  omeprazole (PRILOSEC OTC) 20 MG tablet Take 20 mg by mouth daily.   Yes [provider]  polyethylene glycol powder (GLYCOLAX/MIRALAX) 17 GM/SCOOP powder Take 17 g by mouth daily as needed for mild constipation.   Yes [provider]  predniSONE (DELTASONE) 10 MG tablet Take 3 pills po daily.  Monitor sugars daily Patient taking differently: Take 30 mg by mouth daily. 08/12/19  Yes Sater, Pearletha Furl, MD  lisinopril (ZESTRIL) 30 MG tablet Take 1 tablet (30 mg total) by mouth daily. Patient not taking: Reported on 10/16/2020 03/06/19   Vassie Loll, MD   No Known Allergies Review of Systems  Denies complaints, but unreliable historian  Physical Exam  General: Alert, awake, in no acute distress.  HEENT: No bruits, no goiter, no JVD Heart: Regular  rate and rhythm. No murmur appreciated. Lungs: Fair air movement, clear Abdomen: Soft, nontender, nondistended, positive bowel sounds.  Ext: No significant edema Skin: Warm and dry  Vital Signs: BP (!) 150/86 (BP Location: Right Arm)   Pulse (!) 122   Temp 99.6 F (37.6 C)   Resp 20   Ht 5\' 5"  (1.651 m)   Wt 70.3 kg   SpO2 93%   BMI 25.79 kg/m  Pain Scale: 0-10   Pain Score: 0-No pain   SpO2: SpO2: 93 % O2 Device:SpO2: 93 % O2 Flow Rate: .O2 Flow Rate (L/min): 2 L/min  IO: Intake/output summary:   Intake/Output Summary (Last 24 hours) at 10/19/2020 0747 Last data filed at 10/19/2020 0530 Gross per 24 hour  Intake --  Output 1250 ml  Net -1250 ml    LBM: Last BM Date: 10/17/20 Baseline Weight: Weight: 70.3 kg Most recent weight: Weight: 70.3 kg     Palliative Assessment/Data:     Time In: 1400 Time Out: 1500 Time Total: 60 Greater than 50%  of this time was spent counseling and coordinating care related to the above assessment and plan.  Signed by: 10/19/20, MD   Please contact Palliative Medicine Team phone at [phone] for questions and concerns.  For individual provider: See [phone]

## 2020-10-19 NOTE — Progress Notes (Signed)
Chaplain visited patient.  Patient opened eyes but did not offer eye contact with chaplain.  According to the nurse with whom I spoke after, patient has not been speaking.  Chaplain offered prayer for patient.  The spiritual care team is available if sister comes to visit or needs to speak with someone. Rev. Lynnell Chad Pager [phone]

## 2020-10-19 NOTE — TOC Progression Note (Signed)
Transition of Care [facility]) - Progression Note    Patient Details  Name: Janice Little MRN: [id number] Date of Birth: [birth year]  Transition of [facility]) CM/SW Contact  Geni Bers, RN Phone Number: 10/19/2020, 3:55 PM  Clinical Narrative:     Spoke with pt's sister Valerie Roys [phone] who is HCPOA for pt. Pt's son Eston Esters is an RN unable to answer the telephone. Sister [name] is the contact person. A call was made to [facility] admission coordinator Janie who states pt is Long Term there and can return when stable. Family will need to sign pt in.   Expected Discharge Plan: Skilled Nursing Facility Barriers to Discharge: No Barriers Identified  Expected Discharge Plan and Services Expected Discharge Plan: Skilled Nursing Facility       Living arrangements for the past 2 months: Skilled Nursing Facility                                       Social Determinants of Health (SDOH) Interventions    Readmission Risk Interventions Readmission Risk Prevention Plan 09/18/2019 12/24/2018  Post Dischage Appt - Complete  Medication Screening - Complete  Transportation Screening Complete Complete  PCP or Specialist Appt within 5-7 Days Complete -  Home Care Screening Complete -  Medication Review (RN CM) Complete -  Some recent data might be hidden

## 2020-10-20 ENCOUNTER — Inpatient Hospital Stay (HOSPITAL_COMMUNITY): Payer: Medicare Other

## 2020-10-20 DIAGNOSIS — K810 Acute cholecystitis: Secondary | ICD-10-CM

## 2020-10-20 DIAGNOSIS — R531 Weakness: Secondary | ICD-10-CM

## 2020-10-20 LAB — CBC WITH DIFFERENTIAL/PLATELET
Abs Immature Granulocytes: 1 10*3/uL — ABNORMAL HIGH (ref 0.00–0.07)
Basophils Absolute: 0 10*3/uL (ref 0.0–0.1)
Basophils Relative: 0 %
Eosinophils Absolute: 0.2 10*3/uL (ref 0.0–0.5)
Eosinophils Relative: 1 %
HCT: 34 % — ABNORMAL LOW (ref 36.0–46.0)
Hemoglobin: 10.5 g/dL — ABNORMAL LOW (ref 12.0–15.0)
Immature Granulocytes: 8 %
Lymphocytes Relative: 8 %
Lymphs Abs: 1 10*3/uL (ref 0.7–4.0)
MCH: 27.1 pg (ref 26.0–34.0)
MCHC: 30.9 g/dL (ref 30.0–36.0)
MCV: 87.9 fL (ref 80.0–100.0)
Monocytes Absolute: 0.3 10*3/uL (ref 0.1–1.0)
Monocytes Relative: 3 %
Neutro Abs: 9.4 10*3/uL — ABNORMAL HIGH (ref 1.7–7.7)
Neutrophils Relative %: 80 %
Platelets: 265 10*3/uL (ref 150–400)
RBC: 3.87 MIL/uL (ref 3.87–5.11)
RDW: 16.1 % — ABNORMAL HIGH (ref 11.5–15.5)
WBC: 11.9 10*3/uL — ABNORMAL HIGH (ref 4.0–10.5)
nRBC: 0.5 % — ABNORMAL HIGH (ref 0.0–0.2)

## 2020-10-20 LAB — COMPREHENSIVE METABOLIC PANEL
ALT: 57 U/L — ABNORMAL HIGH (ref 0–44)
AST: 67 U/L — ABNORMAL HIGH (ref 15–41)
Albumin: 2.5 g/dL — ABNORMAL LOW (ref 3.5–5.0)
Alkaline Phosphatase: 178 U/L — ABNORMAL HIGH (ref 38–126)
Anion gap: 11 (ref 5–15)
BUN: 37 mg/dL — ABNORMAL HIGH (ref 8–23)
CO2: 25 mmol/L (ref 22–32)
Calcium: 9 mg/dL (ref 8.9–10.3)
Chloride: 110 mmol/L (ref 98–111)
Creatinine, Ser: 0.57 mg/dL (ref 0.44–1.00)
GFR, Estimated: >60 mL/min (ref >60)
Glucose, Bld: 167 mg/dL — ABNORMAL HIGH (ref 70–99)
Potassium: 3.1 mmol/L — ABNORMAL LOW (ref 3.5–5.1)
Sodium: 146 mmol/L — ABNORMAL HIGH (ref 135–145)
Total Bilirubin: 0.5 mg/dL (ref 0.3–1.2)
Total Protein: 5.5 g/dL — ABNORMAL LOW (ref 6.5–8.1)

## 2020-10-20 LAB — GLUCOSE, CAPILLARY
Glucose-Capillary: 133 mg/dL — ABNORMAL HIGH (ref 70–99)
Glucose-Capillary: 247 mg/dL — ABNORMAL HIGH (ref 70–99)
Glucose-Capillary: 161 mg/dL — ABNORMAL HIGH (ref 70–99)
Glucose-Capillary: 91 mg/dL (ref 70–99)

## 2020-10-20 LAB — D-DIMER, QUANTITATIVE: D-Dimer, Quant: 3.56 ug/mL-FEU — ABNORMAL HIGH (ref 0.00–0.50)

## 2020-10-20 LAB — MAGNESIUM: Magnesium: 1.9 mg/dL (ref 1.7–2.4)

## 2020-10-20 LAB — C-REACTIVE PROTEIN: CRP: 7.1 mg/dL — ABNORMAL HIGH (ref <1.0)

## 2020-10-20 LAB — FERRITIN: Ferritin: 190 ng/mL (ref 11–307)

## 2020-10-20 MED ORDER — MORPHINE SULFATE (PF) 4 MG/ML IV SOLN: 3.0000 mg | Freq: Once | INTRAVENOUS | Status: AC

## 2020-10-20 MED ORDER — MORPHINE SULFATE (PF) 4 MG/ML IV SOLN
INTRAVENOUS | Status: AC
  Administered 2020-10-20: 3 mg via INTRAVENOUS
  Filled 2020-10-20: qty 1

## 2020-10-20 MED ORDER — PIPERACILLIN-TAZOBACTAM 3.375 G IVPB
3.3750 g | Freq: Three times a day (TID) | INTRAVENOUS | Status: DC
  Administered 2020-10-20 – 2020-10-23 (×9): 3.375 g via INTRAVENOUS
  Filled 2020-10-20 (×10): qty 50

## 2020-10-20 MED ORDER — MORPHINE BOLUS VIA INFUSION: 3.0000 mg | Freq: Once | INTRAVENOUS | Status: DC

## 2020-10-20 MED ORDER — TECHNETIUM TC 99M MEBROFENIN IV KIT
5.5000 | PACK | Freq: Once | INTRAVENOUS | Status: AC
  Administered 2020-10-20: 5.5 via INTRAVENOUS

## 2020-10-20 MED ORDER — POTASSIUM CHLORIDE CRYS ER 20 MEQ PO TBCR
40.0000 meq | EXTENDED_RELEASE_TABLET | Freq: Once | ORAL | Status: AC
  Administered 2020-10-20: 40 meq via ORAL
  Filled 2020-10-20: qty 2

## 2020-10-20 MED ORDER — LACTATED RINGERS IV SOLN: INTRAVENOUS | Status: AC

## 2020-10-20 NOTE — Care Management Important Message (Signed)
Important Message  Patient Details IM Letter placed in Patient's door [place]. Name: Janice Little MRN: [id number] Date of Birth: 1946-03-23   Medicare Important Message Given:  Yes     Caren Macadam 10/20/2020, 3:12 PM

## 2020-10-20 NOTE — Consult Note (Addendum)
[facility] Surgery Consult Note  Janice Little Jun 15, 1946  [id number].    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 Per chart review she tested positive for covid 4 days prior to admission. In the ED the patient was hemodynamically stable with sinus tachycardia, tachypnea, and hypoxia (77% ORA that improved with [place]). CT chest revealed bilateral pulmonary opacities consistet with covid PNA and the patient was admitted for further care. She has improved on abx, steroids, and remdesivir but on 1/17 her LFTs increased acutely (AST 67, ALT 57, Alk Phos 178) and RUQ U/S raised a concern for cholecystitis. Surgery has been asked to evaluate.   Today during my exam the patient appears comfortable - history is limited by the patients aphasia but she shakes her head no when I ask her if she has abdominal pain. She denies recent pain or nausea/vomiting after meals. Per RN the patient has been eating her current D1 diet well, without issue. Pt  Takes 325 mg ASA daily due to history of CVA. History of vesicovaginal fistula closure and TAH.    I spoke to her sister and medical POA Janice Little on the phone who denies a family history of gallbladder disease.   ROS: Review of Systems  Constitutional: Negative for chills and fever.  Gastrointestinal: Negative.   Genitourinary: Negative.   All other systems reviewed and are negative.  Family History  Problem Relation Age of Onset  . Mental illness Mother   . COPD Father   . Lung cancer Father     Past Medical History:  Diagnosis Date  . Chronic kidney disease (CKD), stage II (mild)   . Chronic pain    . DM type 2 (diabetes mellitus, type 2) ([place])   . Fatty liver    per CT  . GERD (gastroesophageal reflux disease)   . History of hepatitis as a child    age 23 (jaundice)  . HTN (hypertension)   . Hyperlipidemia   . MS (multiple sclerosis) ([place])   . Obesity   . Osteopenia   . Primary hyperparathyroidism (HCC)    s/p surgery removal of parathyroid (Dr. Buddy Duty, Dr. Harlow Asa)  . Vitamin D deficiency     Past Surgical History:  Procedure Laterality Date  . ANTERIOR CRUCIATE LIGAMENT REPAIR Left   . LOOP RECORDER INSERTION N/A 03/05/2019   Procedure: LOOP RECORDER INSERTION;  Surgeon: Evans Lance, MD;  Location: [place] [facility];  Service: Cardiovascular;  Laterality: N/A;  . VESICOVAGINAL FISTULA CLOSURE W/ TAH      Social History:  reports that she has never smoked. She has never used smokeless tobacco. She reports that she does not drink alcohol and does not use drugs.  Allergies: No Known Allergies  Medications Prior to Admission  Medication Sig Dispense Refill  . acetaminophen (TYLENOL) 325 MG tablet Take 2 tablets (650 mg total) by mouth every 4 (four) hours as needed for mild pain (or temp > 37.5 C (99.5 F)).    Marland Kitchen amLODipine (NORVASC) 5 MG tablet Take 5 mg by mouth daily.    Marland Kitchen aspirin 325 MG tablet Take 1 tablet (325 mg total) by mouth daily.    . Cholecalciferol (VITAMIN  D) 50 MCG (2000 UT) CAPS Take 2,000 Units by mouth daily.    . DULoxetine (CYMBALTA) 60 MG capsule Take 60 mg by mouth daily.    Marland Kitchen gabapentin (NEURONTIN) 100 MG capsule Take 100 mg by mouth 3 (three) times daily.    . hydrALAZINE (APRESOLINE) 25 MG tablet Take 3 tablets (75 mg total) by mouth every 8 (eight) hours.    . Infant Care Products (DERMACLOUD) OINT Apply 1 application topically daily. Apply to buttocks.    . insulin glargine (LANTUS) 100 UNIT/ML injection Inject 34 Units into the skin at bedtime.    . insulin lispro (HUMALOG) 100 UNIT/ML injection Inject 2-11 Units into the skin 3 (three) times  daily before meals. Sliding scale: 101-150=2 U 151-200=3 U 201-250=5 U 251-300=7 U 301-350=9 U >350=11 U  Call MD for BS>400 or <60    . lisinopril (ZESTRIL) 40 MG tablet Take 40 mg by mouth daily.    . Melatonin 3 MG TABS Take 3 mg by mouth at bedtime.     . metFORMIN (GLUCOPHAGE-XR) 500 MG 24 hr tablet Take 1 tablet (500 mg total) by mouth 2 (two) times a day.    . Multiple Vitamins-Minerals (CEROVITE SENIOR) TABS Take 1 tablet by mouth daily.    Marland Kitchen omeprazole (PRILOSEC OTC) 20 MG tablet Take 20 mg by mouth daily.    . polyethylene glycol powder (GLYCOLAX/MIRALAX) 17 GM/SCOOP powder Take 17 g by mouth daily as needed for mild constipation.    . predniSONE (DELTASONE) 10 MG tablet Take 3 pills po daily.  Monitor sugars daily (Patient taking differently: Take 30 mg by mouth daily.) 90 tablet 3  . lisinopril (ZESTRIL) 30 MG tablet Take 1 tablet (30 mg total) by mouth daily. (Patient not taking: Reported on 10/16/2020)      Blood pressure (!) 165/94, pulse (!) 110, temperature 97.8 F (36.6 C), temperature source Oral, resp. rate 20, height $RemoveBe'5\' 5"'[id number]$  (1.651 m), weight 70.3 kg, SpO2 97 %. Physical Exam: Constitutional: NAD; no deformities; laying in bed Eyes: Moist conjunctiva; no lid lag; anicteric; PERRL Neck: Trachea midline; no thyromegaly Lungs: Normal respiratory effort on 1 L [place]; no tactile fremitus CV: RRR; no palpable thrills; no pitting edema GI: Abd soft, obese, non-tender; no palpable hepatosplenomegaly, previous laparotomy scar inferior to umbilicus appears well-healing. MSK: symmetrical; no clubbing/cyanosis Psychiatric flat affect Lymphatic: No palpable cervical or axillary lymphadenopathy Neuro: expressive aphasia - says "hi" and "yes" but otherwise responds with shaking her head yes or no. Alert, FC, grip strength 1/5 LUE, 5/5 RUE, can perform limited flexion of left ankle and has full ROM R ankle.   Results for orders placed or performed during the hospital encounter of  10/16/20 (from the past 48 hour(s))  Glucose, capillary     Status: Abnormal   Collection Time: 10/18/20  4:46 PM  Result Value Ref Range   Glucose-Capillary >600 (HH) 70 - 99 mg/dL    Comment: Glucose reference range applies only to samples taken after fasting for at least 8 hours.   Comment 1 Notify RN    Comment 2 Document in Chart   Glucose, capillary     Status: Abnormal   Collection Time: 10/18/20  4:47 PM  Result Value Ref Range   Glucose-Capillary 591 (HH) 70 - 99 mg/dL    Comment: Glucose reference range applies only to samples taken after fasting for at least 8 hours.   Comment 1 Notify RN    Comment 2 Document in Chart  Glucose, capillary     Status: Abnormal   Collection Time: 10/18/20  8:42 PM  Result Value Ref Range   Glucose-Capillary 455 (H) 70 - 99 mg/dL    Comment: Glucose reference range applies only to samples taken after fasting for at least 8 hours.  CBC with Differential/Platelet     Status: Abnormal   Collection Time: 10/19/20  3:53 AM  Result Value Ref Range   WBC 7.5 4.0 - 10.5 K/uL   RBC 3.60 (L) 3.87 - 5.11 MIL/uL   Hemoglobin 9.6 (L) 12.0 - 15.0 g/dL   HCT 31.7 (L) 36.0 - 46.0 %   MCV 88.1 80.0 - 100.0 fL   MCH 26.7 26.0 - 34.0 pg   MCHC 30.3 30.0 - 36.0 g/dL   RDW 16.1 (H) 11.5 - 15.5 %   Platelets 270 150 - 400 K/uL   nRBC 0.8 (H) 0.0 - 0.2 %   Neutrophils Relative % 80 %   Neutro Abs 6.0 1.7 - 7.7 K/uL   Lymphocytes Relative 5 %   Lymphs Abs 0.3 (L) 0.7 - 4.0 K/uL   Monocytes Relative 3 %   Monocytes Absolute 0.3 0.1 - 1.0 K/uL   Eosinophils Relative 1 %   Eosinophils Absolute 0.1 0.0 - 0.5 K/uL   Basophils Relative 1 %   Basophils Absolute 0.1 0.0 - 0.1 K/uL   WBC Morphology MILD LEFT SHIFT (1-5% METAS, OCC MYELO, OCC BANDS)     Comment: TOXIC GRANULATION   Immature Granulocytes 10 %   Abs Immature Granulocytes 0.73 (H) 0.00 - 0.07 K/uL    Comment: Performed at [facility], [place] [address], [place], [place] [address]   Comprehensive metabolic panel     Status: Abnormal   Collection Time: 10/19/20  3:53 AM  Result Value Ref Range   Sodium 148 (H) 135 - 145 mmol/L   Potassium 3.3 (L) 3.5 - 5.1 mmol/L   Chloride 110 98 - 111 mmol/L   CO2 24 22 - 32 mmol/L   Glucose, Bld 447 (H) 70 - 99 mg/dL    Comment: Glucose reference range applies only to samples taken after fasting for at least 8 hours.   BUN 42 (H) 8 - 23 mg/dL   Creatinine, Ser 0.70 0.44 - 1.00 mg/dL   Calcium 8.9 8.9 - 10.3 mg/dL   Total Protein 5.6 (L) 6.5 - 8.1 g/dL   Albumin 2.4 (L) 3.5 - 5.0 g/dL   AST 29 15 - 41 U/L   ALT 41 0 - 44 U/L   Alkaline Phosphatase 157 (H) 38 - 126 U/L   Total Bilirubin 0.3 0.3 - 1.2 mg/dL   GFR, Estimated >60 >60 mL/min    Comment: (NOTE) Calculated using the CKD-EPI Creatinine Equation (2021)    Anion gap 14 5 - 15    Comment: Performed at [facility], [place] [address] [place], [place] [address]  C-reactive protein     Status: Abnormal   Collection Time: 10/19/20  3:53 AM  Result Value Ref Range   CRP 14.4 (H) <1.0 mg/dL    Comment: Performed at [facility], [place] [address] [place], [place] [address]  D-dimer, quantitative (not at [facility])     Status: Abnormal   Collection Time: 10/19/20  3:53 AM  Result Value Ref Range   D-Dimer, Quant 2.45 (H) 0.00 - 0.50 ug/mL-FEU    Comment: (NOTE) At the manufacturer cut-off value of 0.5 g/mL FEU, this assay has a negative  predictive value of 95-100%.This assay is intended for use in conjunction with a clinical pretest probability (PTP) assessment model to exclude pulmonary embolism (PE) and deep venous thrombosis (DVT) in outpatients suspected of PE or DVT. Results should be correlated with clinical presentation. Performed at [facility], [place] [address] [place], [place] [address]   Ferritin     Status: None   Collection Time: 10/19/20  3:53 AM  Result Value Ref Range   Ferritin 218 11 - 307 ng/mL     Comment: Performed at [facility], [place] [address] [place], [place] [address]  Glucose, capillary     Status: Abnormal   Collection Time: 10/19/20  7:40 AM  Result Value Ref Range   Glucose-Capillary 356 (H) 70 - 99 mg/dL    Comment: Glucose reference range applies only to samples taken after fasting for at least 8 hours.  Glucose, capillary     Status: Abnormal   Collection Time: 10/19/20 11:36 AM  Result Value Ref Range   Glucose-Capillary 554 (HH) 70 - 99 mg/dL    Comment: Glucose reference range applies only to samples taken after fasting for at least 8 hours.   Comment 1 Notify RN   Glucose, capillary     Status: Abnormal   Collection Time: 10/19/20  5:13 PM  Result Value Ref Range   Glucose-Capillary 351 (H) 70 - 99 mg/dL    Comment: Glucose reference range applies only to samples taken after fasting for at least 8 hours.  Glucose, capillary     Status: Abnormal   Collection Time: 10/19/20  9:12 PM  Result Value Ref Range   Glucose-Capillary 199 (H) 70 - 99 mg/dL    Comment: Glucose reference range applies only to samples taken after fasting for at least 8 hours.  Magnesium     Status: None   Collection Time: 10/20/20  4:00 AM  Result Value Ref Range   Magnesium 1.9 1.7 - 2.4 mg/dL    Comment: Performed at [facility], [place] [address], [place], [place] [address]  CBC with Differential/Platelet     Status: Abnormal   Collection Time: 10/20/20  4:07 AM  Result Value Ref Range   WBC 11.9 (H) 4.0 - 10.5 K/uL   RBC 3.87 3.87 - 5.11 MIL/uL   Hemoglobin 10.5 (L) 12.0 - 15.0 g/dL   HCT 34.0 (L) 36.0 - 46.0 %   MCV 87.9 80.0 - 100.0 fL   MCH 27.1 26.0 - 34.0 pg   MCHC 30.9 30.0 - 36.0 g/dL   RDW 16.1 (H) 11.5 - 15.5 %   Platelets 265 150 - 400 K/uL   nRBC 0.5 (H) 0.0 - 0.2 %   Neutrophils Relative % 80 %   Neutro Abs 9.4 (H) 1.7 - 7.7 K/uL   Lymphocytes Relative 8 %   Lymphs Abs 1.0 0.7 - 4.0 K/uL   Monocytes Relative 3 %   Monocytes  Absolute 0.3 0.1 - 1.0 K/uL   Eosinophils Relative 1 %   Eosinophils Absolute 0.2 0.0 - 0.5 K/uL   Basophils Relative 0 %   Basophils Absolute 0.0 0.0 - 0.1 K/uL   WBC Morphology      MODERATE LEFT SHIFT (>5% METAS AND MYELOS,OCC PRO NOTED)   Immature Granulocytes 8 %   Abs Immature Granulocytes 1.00 (H) 0.00 - 0.07 K/uL   Reactive, Benign Lymphocytes PRESENT    Polychromasia PRESENT     Comment: Performed at [facility], [address] [place], [place] [address]  Comprehensive metabolic panel     Status: Abnormal   Collection Time: 10/20/20  4:07 AM  Result Value Ref Range   Sodium 146 (H) 135 - 145 mmol/L   Potassium 3.1 (L) 3.5 - 5.1 mmol/L   Chloride 110 98 - 111 mmol/L   CO2 25 22 - 32 mmol/L   Glucose, Bld 167 (H) 70 - 99 mg/dL    Comment: Glucose reference range applies only to samples taken after fasting for at least 8 hours.   BUN 37 (H) 8 - 23 mg/dL   Creatinine, Ser 0.57 0.44 - 1.00 mg/dL   Calcium 9.0 8.9 - 10.3 mg/dL   Total Protein 5.5 (L) 6.5 - 8.1 g/dL   Albumin 2.5 (L) 3.5 - 5.0 g/dL   AST 67 (H) 15 - 41 U/L   ALT 57 (H) 0 - 44 U/L   Alkaline Phosphatase 178 (H) 38 - 126 U/L   Total Bilirubin 0.5 0.3 - 1.2 mg/dL   GFR, Estimated >60 >60 mL/min    Comment: (NOTE) Calculated using the CKD-EPI Creatinine Equation (2021)    Anion gap 11 5 - 15    Comment: Performed at [facility], [place] [address], [place], [place] [address]  C-reactive protein     Status: Abnormal   Collection Time: 10/20/20  4:07 AM  Result Value Ref Range   CRP 7.1 (H) <1.0 mg/dL    Comment: Performed at [facility], [place] [address], [place], [place] [address]  D-dimer, quantitative (not at [facility])     Status: Abnormal   Collection Time: 10/20/20  4:07 AM  Result Value Ref Range   D-Dimer, Quant 3.56 (H) 0.00 - 0.50 ug/mL-FEU    Comment: (NOTE) At the manufacturer cut-off value of 0.5 g/mL FEU, this assay has a negative predictive  value of 95-100%.This assay is intended for use in conjunction with a clinical pretest probability (PTP) assessment model to exclude pulmonary embolism (PE) and deep venous thrombosis (DVT) in outpatients suspected of PE or DVT. Results should be correlated with clinical presentation. Performed at [facility], [place] [address] [place], [place] [address]   Ferritin     Status: None   Collection Time: 10/20/20  4:07 AM  Result Value Ref Range   Ferritin 190 11 - 307 ng/mL    Comment: Performed at [facility], [place] [address], [place],  [address]  Glucose, capillary     Status: Abnormal   Collection Time: 10/20/20  7:58 AM  Result Value Ref Range   Glucose-Capillary 133 (H) 70 - 99 mg/dL    Comment: Glucose reference range applies only to samples taken after fasting for at least 8 hours.  Glucose, capillary     Status: Abnormal   Collection Time: 10/20/20 11:52 AM  Result Value Ref Range   Glucose-Capillary 247 (H) 70 - 99 mg/dL    Comment: Glucose reference range applies only to samples taken after fasting for at least 8 hours.   US Abdomen Limited RUQ (LIVER/GB)  Result Date: 10/20/2020 CLINICAL DATA:  Elevated LFTs EXAM: ULTRASOUND ABDOMEN LIMITED RIGHT UPPER QUADRANT COMPARISON:  CT of the chest dated October 17, 2020 FINDINGS: Gallbladder: Wall thickening is noted with "wall echo shadow" sign. Tenderness reported over the gallbladder by the sonographer. The lumen of the gallbladder is filled with calculi such that the posterior wall of the gallbladder is not evaluated. Common bile duct: Diameter: Not visualized  due to limitations from shadowing arising from the calculus filled gallbladder and patient body habitus. Liver: Limited assessment of the liver due to patient body habitus. No gross lesion on submitted images. Portal vein is patent on color Doppler imaging with normal direction of blood flow towards the liver. Other: None. IMPRESSION:  Ultrasound findings of acute cholecystitis with reported tenderness over the gallbladder in the setting of cholelithiasis with numerous gallstones filling the lumen of the gallbladder. Common bile duct not evaluated due to abundant shadowing from stone filled gallbladder. Electronically Signed   By: Zetta Bills M.D.   On: 10/20/2020 09:00    Assessment/Plan Sepsis without septic shock - resolved  Acute hypoxic respiratory failure ( due to below) COVID 19 PNA Acute metabolic encephalopathy  PMH CVA w/ residual cognitive deficit and left hemiplegia - on 325 mg ASA HTN DM2 Hypernatremia Hypokalemia Leukocytosis - 11.9, in the setting of known COVID, possible cholecystitis  -above per primary team-   Abnormal LFT's Cholelithiasis  - afebrile, HR 104 bpm, WBC 11.9  - clinically the patients exam is benign and not consistent with cholecystitis. Due to patient condition I could not gather a complete history of her abdominal pain but, based on what I could gather, she does not have a history of biliary colic, RUQ pain, or post-prandial N/V.  - LFT's slightly up which is an acute change and RUQ U/S shows cholelithiasis and wall thickening, consistent with possible acute vs chronic calculous cholecystitis.  - will order HIDA to further evaluate - given multiple medical co-morbidities and active COVID PNA, if patient does have cholecystitis I would recommend non-operative management with IV abx and percutaneous cholecystostomy tube, as surgery would be an increased risk of perioperative morbidity to the patient. - agree with [place] discussion and will follow - I discussed this patients plan of care with her sister, Janice Little, and answered her questions.    Jill Alexanders, PA-C [facility] Please see Amion for pager number during day hours 7:00am-4:30pm 10/20/2020, 12:36 PM

## 2020-10-20 NOTE — Progress Notes (Signed)
Pharmacy Antibiotic Note  Janice Little is a 75 y.o. female who was recently diagnosed with COVID-19 presented to the ED from nursing facility on 10/16/2020 with AMS and generalized weakness.  She was started on azithromycin and ceftriaxone on admission for PNA. [place] abd on 1/18 showed acute cholecystitis.  Pharmacy has been consulted to change abx to zosyn for infection.   Today, 10/20/2020: - Tmax 99.8, wbc up 11.9 - scr 0.57 (crcl~58)  Plan: - zosyn 3.375 gm IV q8h (infuse over 4 hrs) - With stable renal function, pharmacy will sign off for abx consult.  Reconsult [place] if need further assistance.  ______________________________________  Height: 5\' 5"  (165.1 cm) Weight: 70.3 kg (154 lb 15.7 oz) IBW/kg (Calculated) : 57  Temp (24hrs), Avg:98.7 F (37.1 C), Min:97.8 F (36.6 C), Max:99.8 F (37.7 C)  Recent Labs  Lab 10/16/20 1525 10/17/20 0458 10/18/20 0357 10/19/20 0353 10/20/20 0407  WBC 14.8* 11.3* 7.0 7.5 11.9*  CREATININE 0.71 0.64 0.76 0.70 0.57  LATICACIDVEN 1.2  --   --   --   --     Estimated Creatinine Clearance: 58.8 mL/min (by C-G formula based on SCr of 0.57 mg/dL).    No Known Allergies  1/15  vanc>> 1/17 1/15 azithro>> 1/18 1/15 CTX>> 1/18 1/14 Remdesivir>>1/18 1/18 zosyn (IAI)>>  1/14 BCx x2:  1/2 staph hominis 1/14 MRSA PCR: positive  Thank you for allowing pharmacy to be a part of this patient's care.  2/14 10/20/2020 11:27 AM

## 2020-10-20 NOTE — Progress Notes (Addendum)
Daily Progress Note   Patient Name: Janice Little       Date: 10/20/2020 DOB: [birth year]  Age: 75 y.o. MRN#: [id number] Attending Physician: Jae Dire, MD Primary Care Physician: Juluis Rainier, MD Admit Date: 10/16/2020  Reason for Consultation/Follow-up: Establishing goals of care  Subjective:  noted to be resting comfortably, no distress, PO intake is good with assistance, discussed with RN.   Length of Stay: 4  Current Medications: Scheduled Meds:  . amLODipine  5 mg Oral Daily  . aspirin  325 mg Oral Daily  . Chlorhexidine Gluconate Cloth  6 each Topical Q0600  . DULoxetine  60 mg Oral Daily  . enoxaparin (LOVENOX) injection  40 mg Subcutaneous Q24H  . feeding supplement  237 mL Oral BID BM  . insulin aspart  0-20 Units Subcutaneous TID WC  . insulin aspart  10 Units Subcutaneous TID WC  . insulin detemir  0.15 Units/kg Subcutaneous BID  . Ipratropium-Albuterol  1 puff Inhalation Q6H  . linagliptin  5 mg Oral Daily  . multivitamin with minerals  1 tablet Oral Daily  . mupirocin ointment  1 application Nasal BID  . predniSONE  50 mg Oral Daily  . sodium chloride flush  3 mL Intravenous Q12H    Continuous Infusions: . sodium chloride    . lactated ringers 100 mL/hr at 10/20/20 1217  . piperacillin-tazobactam (ZOSYN)  IV 3.375 g (10/20/20 1223)    PRN Meds: sodium chloride, acetaminophen, guaiFENesin-dextromethorphan, labetalol, lip balm, sodium chloride flush  Physical Exam         No distress Regular work of breathing No signs of uncontrolled pain  Vital Signs: BP (!) 158/90 (BP Location: Right Arm)   Pulse (!) 104   Temp 97.8 F (36.6 C)   Resp 14   Ht 5\' 5"  (1.651 m)   Wt 70.3 kg   SpO2 98%   BMI 25.79 kg/m  SpO2: SpO2: 98 % O2 Device: O2  Device: Nasal Cannula O2 Flow Rate: O2 Flow Rate (L/min): 2 L/min  Intake/output summary:   Intake/Output Summary (Last 24 hours) at 10/20/2020 1320 Last data filed at 10/20/2020 0458 Gross per 24 hour  Intake --  Output 1200 ml  Net -1200 ml   LBM: Last BM Date: 10/17/20 Baseline Weight: Weight: 70.3 kg Most recent weight: Weight:  70.3 kg       Palliative Assessment/Data:      Patient Active Problem List   Diagnosis Date Noted  . Acute hypoxemic respiratory failure due to COVID-19 (HCC) 10/16/2020  . Acute metabolic encephalopathy 10/16/2020  . Gross hematuria   . Acute sepsis (HCC)   . Sepsis secondary to UTI (HCC)   . Diarrhea   . Elevated sed rate 08/12/2019  . Elevated C-reactive protein (CRP) 08/12/2019  . Left-sided neglect 08/01/2019  . Myalgia 08/01/2019  . Pressure injury of skin 03/04/2019  . Acute lower UTI   . Stroke (HCC) 03/02/2019  . Acute CVA (cerebrovascular accident) (HCC) 03/01/2019  . Hypertensive urgency 12/19/2018  . History of myelitis 12/26/2017  . History of optic neuritis 04/02/2015  . Diplopia 04/02/2015  . Urinary incontinence 04/02/2015  . Other fatigue 04/02/2015  . 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 Numbness 04/02/2015  . Ataxic gait 04/02/2015  . Essential tremor 04/02/2015  . PRIMARY HYPERPARATHYROIDISM 02/01/2008  . OSTEOPOROSIS 02/01/2008  . Type 2 diabetes mellitus with hypoglycemia without coma (HCC) 01/31/2008  . Hyperlipidemia 01/31/2008  . Multiple sclerosis (HCC) 01/31/2008  . Optic neuritis 01/31/2008  . Essential hypertension 01/31/2008  . 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  Patient remains on appropriate treatment regimen protocols with remdesivir, antibiotics and steroids.  Palliative care consulted for goals of care discussions.  Recommendations/Plan:  Chart reviewed, discussed with members of interdisciplinary team.  Recommend continuation of current mode of care.  Patient's antibiotics adjusted today, abdominal imaging showing possible acute cholecystitis.  Recommend discharge to patient's familiar facility along with palliative support after current hospitalization.    Code Status:    Code Status Orders  (From admission, onward)         Start     Ordered   10/16/20 1748  Limited resuscitation (code)  Continuous       Question Answer Comment  In the event of cardiac or respiratory ARREST: Initiate Code Blue, Call Rapid Response Yes   In the event of cardiac or respiratory ARREST: Perform CPR Yes   In the event of cardiac or respiratory ARREST: Perform Intubation/Mechanical Ventilation No   In the event of cardiac or respiratory ARREST: Use NIPPV/BiPAp only if indicated Yes   In the event of cardiac or respiratory ARREST: Administer ACLS medications if indicated Yes   In the event of cardiac or respiratory ARREST: Perform Defibrillation or Cardioversion if indicated Yes      10/16/20 1747        Code Status History    Date Active Date Inactive Code Status Order ID Comments User Context   09/16/2019 1138 09/25/2019 0333 Full Code [id number]  Josephine Igo, DO ED   03/01/2019 0133 03/06/2019 2142 Full Code [id number]  Eduard Clos, MD ED   12/19/2018 1951 12/26/2018 1602 Full Code [id number]  Elgergawy, Leana Roe, MD ED   Advance Care Planning Activity       Prognosis:   guarded.   Discharge Planning:  Recommend d/c back to [facility] with palliative support.   Care  plan was  discussed with IDT.   Thank you for allowing the Palliative Medicine Team to assist in the care of this patient.   Time In: 1300 Time Out: 1325 Total Time 25 Prolonged Time Billed No.       Greater than 50%  of this time was spent counseling and coordinating care related to the above assessment and plan.  Rosalin Hawking, MD  Please contact Palliative Medicine Team phone at [phone] for questions and concerns.

## 2020-10-20 NOTE — Plan of Care (Signed)
?  Problem: Clinical Measurements: ?Goal: Ability to maintain clinical measurements within normal limits will improve ?Outcome: Progressing ?Goal: Will remain free from infection ?Outcome: Progressing ?Goal: Respiratory complications will improve ?Outcome: Progressing ?Goal: Cardiovascular complication will be avoided ?Outcome: Progressing ?  ?Problem: Nutrition: ?Goal: Adequate nutrition will be maintained ?Outcome: Progressing ?  ?Problem: Elimination: ?Goal: Will not experience complications related to bowel motility ?Outcome: Progressing ?  ?

## 2020-10-20 NOTE — Progress Notes (Signed)
SLP Cancellation Note  Patient Details Name: Janice Little MRN: [id number] DOB: Jun 03, 1946   Cancelled treatment:       Reason Eval/Treat Not Completed: Other (comment);Medical issues which prohibited therapy (pt npo, ? for HIDA scan?  will continue efforts)  Rolena Infante, MS [facility] SLP Acute Rehab Services Office [phone] Pager [phone]   Chales Abrahams 10/20/2020, 2:38 PM

## 2020-10-20 NOTE — Progress Notes (Addendum)
PROGRESS NOTE    Janice Little    Code Status: Partial Code  PNP:[id number] DOB: 1945/12/23 DOA: 10/16/2020 LOS: 4 days  PCP: Juluis Rainier, MD CC:  Chief Complaint  Patient presents with  . Altered Mental Status  . 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  She was diagnosed with COVID-19 on 1/10 and had been having increasing weakness and confusion since.  Found to be afebrile, tachycardic, tachypneic, hypoxic requiring 4 to 5 L/min O2 with presenting labs: Sodium 143, K3.7, glucose 53, BUN 44, creatinine 0.71, LDH 329, triglycerides 116, ferritin 346, CRP 35, lactic acid 1.2, procalcitonin 0.83, WBC 14.8, Hb 10.3, platelets 234, D-dimer 2.62, fibrinogen >800. Notable Imaging: CXR-patchy airspace opacity in mid to lower lung fields bilaterally. Patient received dexamethasone, remdesivir, dextrose 5%.     1/18: LFTs trending up.  RUQ US obtained showing acute cholecystitis with cholelithiasis.  General surgery consulted and antibiotics changed to Zosyn monotherapy.   A & P   Principal Problem:   Acute hypoxemic respiratory failure due to COVID-19 [facility]) Active Problems:   Type 2 diabetes mellitus with hypoglycemia without coma (HCC)   Essential hypertension   Elevated C-reactive protein (CRP)   Acute sepsis (HCC)   Acute metabolic encephalopathy   Acute cholecystitis   1. Sepsis without septic shock  Acute hypoxic respiratory failure secondary to COVID-19 and suspected aspiration pneumonia a. Sepsis criteria on admission: Tachycardia, tachypnea, leukocytosis, hypoxia. Sepsis has resolved b. CRP peaked at 37 and finally trending downward.  Baricitinib held due to bacterial infection c. CTA chest  negative for PE  d. SLP consulted e. Day 5/10 steroids.  Continue solumedrol to 0.5 mg/kg today given hyperglycemia and improving inflammatory markers f. Day 5/5 remdesivir g. Day 4/5 ceftriaxone and azithromycin -> changed to Zosyn as below h. Continue inhalers i. Palliative care consulted   2. Acute cholecystitis a. LFTs trending up on 1/18, asymptomatic-> RUQ ultrasound -> acute cholecystitis with cholelithiasis b. Antibiotics changed to Zosyn monotherapy c. General surgery consulted d. IV fluids  3. Acute metabolic encephalopathy, multifactorial: Hypoxia, hypoglycemia, sepsis a. Appears at/near baseline b. Palliative care consulted   4. Hypernatremia, improved a. Encourage free water intake   5. Hypertension a. Continue amlodipine b. As needed labetalol  6. Hypoglycemia, now hyperglycemia in the setting of type 2 diabetes a. Continue novolog to 10 units TID with meals, Levemir, linagliptin and sliding scale  7. 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 Bedbound at baseline b. Continue home aspirin    DVT prophylaxis: enoxaparin (LOVENOX) injection 40 mg Start: 10/16/20 2000   Family Communication: [name] has been updated today  Disposition Plan: pending clinical improvement Status is: Inpatient  Remains inpatient appropriate because:IV treatments appropriate due to intensity of illness or inability to take PO and Inpatient level of care appropriate due to severity of illness   Dispo: The patient is from: SNF              Anticipated d/c is to: SNF              Anticipated d/c date is: > 3 days              Patient currently is not medically  stable to d/c.           Pressure injury documentation   Pressure Injury 02/20/19 Other (Comment) Upper Stage II -  Partial thickness loss of dermis presenting as a shallow open ulcer with a red, pink wound bed without slough. (Active)  02/20/19 2030  Location: Other (Comment)   Location Orientation: Upper  Staging: Stage II -  Partial thickness loss of dermis presenting as a shallow open ulcer with a red, pink wound bed without slough.  Wound Description (Comments):   Present on Admission: No     Pressure Injury 09/16/19 Sacrum Posterior Stage I -  Intact skin with non-blanchable redness of a localized area usually over a bony prominence. (Active)  09/16/19 1830  Location: Sacrum  Location Orientation: Posterior  Staging: Stage I -  Intact skin with non-blanchable redness of a localized area usually over a bony prominence.  Wound Description (Comments):   Present on Admission:      Pressure Injury 09/16/19 Heel Right;Left Stage I -  Intact skin with non-blanchable redness of a localized area usually over a bony prominence. (Active)  09/16/19 1830  Location: Heel  Location Orientation: Right;Left  Staging: Stage I -  Intact skin with non-blanchable redness of a localized area usually over a bony prominence.  Wound Description (Comments):   Present on Admission: Yes   Consultants  Palliative General surgery  Procedures  None  Antibiotics   Anti-infectives (From admission, onward)   Start     Dose/Rate Route Frequency Ordered Stop   10/20/20 1200  piperacillin-tazobactam (ZOSYN) IVPB 3.375 g        3.375 g 12.5 mL/hr over 240 Minutes Intravenous Every 8 hours 10/20/20 1138     10/18/20 1000  vancomycin (VANCOREADY) IVPB 1500 mg/300 mL  Status:  Discontinued        1,500 mg 150 mL/hr over 120 Minutes Intravenous Every 24 hours 10/17/20 0957 10/19/20 1148   10/17/20 1200  cefTRIAXone (ROCEPHIN) 1 g in sodium chloride 0.9 % 100 mL IVPB  Status:  Discontinued        1 g 200 mL/hr over 30 Minutes Intravenous Every 24 hours 10/17/20 1030 10/20/20 1059   10/17/20 1130  azithromycin (ZITHROMAX) 500 mg in sodium chloride 0.9 % 250 mL IVPB  Status:  Discontinued        500 mg 250 mL/hr over 60 Minutes Intravenous Every 24 hours 10/17/20 1030 10/20/20 1059    10/17/20 1000  remdesivir 100 mg in sodium chloride 0.9 % 100 mL IVPB       "Followed by" Linked Group Details   100 mg 200 mL/hr over 30 Minutes Intravenous Daily 10/16/20 1707 10/20/20 0856   10/17/20 0900  vancomycin (VANCOREADY) IVPB 1750 mg/350 mL        1,750 mg 175 mL/hr over 120 Minutes Intravenous  Once 10/17/20 0759 10/17/20 1338   10/16/20 1800  remdesivir 200 mg in sodium chloride 0.9% 250 mL IVPB       "Followed by" Linked Group Details   200 mg 580 mL/hr over 30 Minutes Intravenous Once 10/16/20 1707 10/16/20 1854   10/16/20 1800  cefTRIAXone (ROCEPHIN) 1 g in sodium chloride 0.9 % 100 mL IVPB  Status:  Discontinued        1 g 200 mL/hr over 30 Minutes Intravenous Every 24 hours 10/16/20 1747 10/17/20 1030   10/16/20 1800  azithromycin (ZITHROMAX) 500 mg in sodium chloride 0.9 % 250 mL IVPB  Status:  Discontinued  500 mg 250 mL/hr over 60 Minutes Intravenous Every 24 hours 10/16/20 1747 10/17/20 1030        Subjective   Unable to provide much history due to her baseline mental status. Does not admit to any complaints when asked. No overnight events.   Objective   Vitals:   10/19/20 1500 10/19/20 2116 10/20/20 0450 10/20/20 1312  BP: (!) 179/89 (!) 168/85 (!) 165/94 (!) 158/90  Pulse: (!) 124 (!) 117 (!) 110 (!) 104  Resp: 20  20 14   Temp: 99.8 F (37.7 C) 98.5 F (36.9 C) 97.8 F (36.6 C) 97.8 F (36.6 C)  TempSrc:  Oral Oral   SpO2: 93% 96% 97% 98%  Weight:      Height:        Intake/Output Summary (Last 24 hours) at 10/20/2020 1602 Last data filed at 10/20/2020 0458 Gross per 24 hour  Intake --  Output 500 ml  Net -500 ml   Filed Weights   10/16/20 1944  Weight: 70.3 kg    Examination:  Physical Exam Vitals and nursing note reviewed.  Constitutional:      General: She is not in acute distress. HENT:     Head: Normocephalic.     Mouth/Throat:     Mouth: Mucous membranes are dry.  Eyes:     Conjunctiva/sclera: Conjunctivae normal.   Cardiovascular:     Rate and Rhythm: Normal rate.     Pulses: Normal pulses.  Pulmonary:     Effort: Pulmonary effort is normal. No respiratory distress.  Abdominal:     General: Abdomen is flat. There is no distension.     Tenderness: There is no abdominal tenderness. There is no guarding.  Musculoskeletal:        General: No swelling or tenderness.     Right lower leg: No edema.     Left lower leg: No edema.  Skin:    General: Skin is warm.     Coloration: Skin is not jaundiced.  Neurological:     Mental Status: She is alert. Mental status is at baseline.     Data Reviewed: I have personally reviewed following labs and imaging studies  CBC: Recent Labs  Lab 10/16/20 1525 10/17/20 0458 10/18/20 0357 10/19/20 0353 10/20/20 0407  WBC 14.8* 11.3* 7.0 7.5 11.9*  NEUTROABS 13.4* 10.4* 5.7 6.0 9.4*  HGB 10.3* 10.2* 9.4* 9.6* 10.5*  HCT 33.0* 33.7* 30.4* 31.7* 34.0*  MCV 88.0 88.9 88.4 88.1 87.9  PLT 234 228 244 270 265   Basic Metabolic Panel: Recent Labs  Lab 10/16/20 1525 10/17/20 0458 10/18/20 0357 10/19/20 0353 10/20/20 0400 10/20/20 0407  NA 143 146* 147* 148*  --  146*  K 3.7 3.9 3.1* 3.3*  --  3.1*  CL 104 107 107 110  --  110  CO2 27 26 25 24   --  25  GLUCOSE 53* 74 303* 447*  --  167*  BUN 44* 44* 48* 42*  --  37*  CREATININE 0.71 0.64 0.76 0.70  --  0.57  CALCIUM 8.7* 8.7* 8.5* 8.9  --  9.0  MG  --   --   --   --  1.9  --    GFR: Estimated Creatinine Clearance: 58.8 mL/min (by C-G formula based on SCr of 0.57 mg/dL). Liver Function Tests: Recent Labs  Lab 10/16/20 1525 10/17/20 0458 10/18/20 0357 10/19/20 0353 10/20/20 0407  AST 38 35 31 29 67*  ALT 36 35 38 41 57*  ALKPHOS  107 112 117 157* 178*  BILITOT 0.2* 0.4 0.5 0.3 0.5  PROT 5.7* 5.7* 5.3* 5.6* 5.5*  ALBUMIN 2.3* 2.4* 2.3* 2.4* 2.5*   No results for input(s): LIPASE, AMYLASE in the last 168 hours. No results for input(s): AMMONIA in the last 168 hours. Coagulation Profile: No  results for input(s): INR, PROTIME in the last 168 hours. Cardiac Enzymes: No results for input(s): CKTOTAL, CKMB, CKMBINDEX, TROPONINI in the last 168 hours. BNP (last 3 results) No results for input(s): PROBNP in the last 8760 hours. HbA1C: No results for input(s): HGBA1C in the last 72 hours. CBG: Recent Labs  Lab 10/19/20 1136 10/19/20 1713 10/19/20 2112 10/20/20 0758 10/20/20 1152  GLUCAP 554* 351* 199* 133* 247*   Lipid Profile: No results for input(s): CHOL, HDL, LDLCALC, TRIG, CHOLHDL, LDLDIRECT in the last 72 hours. Thyroid Function Tests: No results for input(s): TSH, T4TOTAL, FREET4, T3FREE, THYROIDAB in the last 72 hours. Anemia Panel: Recent Labs    10/19/20 0353 10/20/20 0407  FERRITIN 218 190   Sepsis Labs: Recent Labs  Lab 10/16/20 1525  PROCALCITON 0.83  LATICACIDVEN 1.2    Recent Results (from the past 240 hour(s))  Blood Culture (routine x 2)     Status: None (Preliminary result)   Collection Time: 10/16/20  3:25 PM   Specimen: BLOOD LEFT FOREARM  Result Value Ref Range Status   Specimen Description   Final    BLOOD LEFT FOREARM Performed at [facility], [address] [place], [place] [address]    Special Requests   Final    BOTTLES DRAWN AEROBIC AND ANAEROBIC Blood Culture adequate volume Performed at [facility], [address] [place], [place] [address]    Culture   Final    NO GROWTH 4 DAYS Performed at [facility], [address], [place], [place] [address]    Report Status PENDING  Incomplete  Blood Culture (routine x 2)     Status: Abnormal   Collection Time: 10/16/20  3:30 PM   Specimen: BLOOD  Result Value Ref Range Status   Specimen Description BLOOD LEFT ANTECUBITAL  Final   Special Requests   Final    BOTTLES DRAWN AEROBIC AND ANAEROBIC Blood Culture adequate volume   Culture  Setup Time   Final    GRAM POSITIVE COCCI IN CLUSTERS ANAEROBIC BOTTLE ONLY CRITICAL RESULT CALLED  TO, READ BACK BY AND VERIFIED WITH: PHARMD N GLOGOVAC AT 1510 10/17/2020 BY L BENFIELD    Culture (A)  Final    STAPHYLOCOCCUS HOMINIS THE SIGNIFICANCE OF ISOLATING THIS ORGANISM FROM A SINGLE SET OF BLOOD CULTURES WHEN MULTIPLE SETS ARE DRAWN IS UNCERTAIN. PLEASE NOTIFY THE MICROBIOLOGY DEPARTMENT WITHIN ONE WEEK IF SPECIATION AND SENSITIVITIES ARE REQUIRED.    Report Status 10/19/2020 FINAL  Final  Blood Culture ID Panel (Reflexed)     Status: Abnormal   Collection Time: 10/16/20  3:30 PM  Result Value Ref Range Status   Enterococcus faecalis NOT DETECTED NOT DETECTED Final   Enterococcus Faecium NOT DETECTED NOT DETECTED Final   Listeria monocytogenes NOT DETECTED NOT DETECTED Final   Staphylococcus species DETECTED (A) NOT DETECTED Final    Comment: CRITICAL RESULT CALLED TO, READ BACK BY AND VERIFIED WITH: PHARMD N GLOGOVAC AT 1510 10/17/2020 BY L BENFIELD    Staphylococcus aureus (BCID) NOT DETECTED NOT DETECTED Final   Staphylococcus epidermidis NOT DETECTED NOT DETECTED Final   Staphylococcus lugdunensis NOT DETECTED NOT DETECTED Final   Streptococcus  species NOT DETECTED NOT DETECTED Final   Streptococcus agalactiae NOT DETECTED NOT DETECTED Final   Streptococcus pneumoniae NOT DETECTED NOT DETECTED Final   Streptococcus pyogenes NOT DETECTED NOT DETECTED Final   A.calcoaceticus-baumannii NOT DETECTED NOT DETECTED Final   Bacteroides fragilis NOT DETECTED NOT DETECTED Final   Enterobacterales NOT DETECTED NOT DETECTED Final   Enterobacter cloacae complex NOT DETECTED NOT DETECTED Final   Escherichia coli NOT DETECTED NOT DETECTED Final   Klebsiella aerogenes NOT DETECTED NOT DETECTED Final   Klebsiella oxytoca NOT DETECTED NOT DETECTED Final   Klebsiella pneumoniae NOT DETECTED NOT DETECTED Final   Proteus species NOT DETECTED NOT DETECTED Final   Salmonella species NOT DETECTED NOT DETECTED Final   Serratia marcescens NOT DETECTED NOT DETECTED Final   Haemophilus influenzae  NOT DETECTED NOT DETECTED Final   Neisseria meningitidis NOT DETECTED NOT DETECTED Final   Pseudomonas aeruginosa NOT DETECTED NOT DETECTED Final   Stenotrophomonas maltophilia NOT DETECTED NOT DETECTED Final   Candida albicans NOT DETECTED NOT DETECTED Final   Candida auris NOT DETECTED NOT DETECTED Final   Candida glabrata NOT DETECTED NOT DETECTED Final   Candida krusei NOT DETECTED NOT DETECTED Final   Candida parapsilosis NOT DETECTED NOT DETECTED Final   Candida tropicalis NOT DETECTED NOT DETECTED Final   Cryptococcus neoformans/gattii NOT DETECTED NOT DETECTED Final    Comment: Performed at [facility], [address], [place], [place] [address]  MRSA PCR Screening     Status: Abnormal   Collection Time: 10/16/20  9:08 PM   Specimen: Nasopharyngeal  Result Value Ref Range Status   MRSA by PCR POSITIVE (A) NEGATIVE Final    Comment:        The GeneXpert MRSA Assay (FDA approved for NASAL specimens only), is one component of a comprehensive MRSA colonization surveillance program. It is not intended to diagnose MRSA infection nor to guide or monitor treatment for MRSA infections. CRITICAL RESULT CALLED TO, READ BACK BY AND VERIFIED WITH: PAM HAMILTON RN 10/16/2020 @2249  BY P.HENDERSON Performed at [facility], [address], [place], [place]          Radiology Studies: [id number] Abdomen Limited RUQ (LIVER/GB)  Result Date: 10/20/2020 CLINICAL DATA:  Elevated LFTs EXAM: ULTRASOUND ABDOMEN LIMITED RIGHT UPPER QUADRANT COMPARISON:  CT of the chest dated October 17, 2020 FINDINGS: Gallbladder: Wall thickening is noted with "wall echo shadow" sign. Tenderness reported over the gallbladder by the sonographer. The lumen of the gallbladder is filled with calculi such that the posterior wall of the gallbladder is not evaluated. Common bile duct: Diameter: Not visualized due to limitations from shadowing arising from the calculus filled gallbladder  and patient body habitus. Liver: Limited assessment of the liver due to patient body habitus. No gross lesion on submitted images. Portal vein is patent on color Doppler imaging with normal direction of blood flow towards the liver. Other: None. IMPRESSION: Ultrasound findings of acute cholecystitis with reported tenderness over the gallbladder in the setting of cholelithiasis with numerous gallstones filling the lumen of the gallbladder. Common bile duct not evaluated due to abundant shadowing from stone filled gallbladder. Electronically Signed   By: October 19, 2020 M.D.   On: 10/20/2020 09:00        Scheduled Meds: . amLODipine  5 mg Oral Daily  . aspirin  325 mg Oral Daily  . Chlorhexidine Gluconate Cloth  6 each Topical Q0600  . DULoxetine  60 mg Oral Daily  . enoxaparin (  LOVENOX) injection  40 mg Subcutaneous Q24H  . feeding supplement  237 mL Oral BID BM  . insulin aspart  0-20 Units Subcutaneous TID WC  . insulin aspart  10 Units Subcutaneous TID WC  . insulin detemir  0.15 Units/kg Subcutaneous BID  . Ipratropium-Albuterol  1 puff Inhalation Q6H  . linagliptin  5 mg Oral Daily  . multivitamin with minerals  1 tablet Oral Daily  . mupirocin ointment  1 application Nasal BID  . predniSONE  50 mg Oral Daily  . sodium chloride flush  3 mL Intravenous Q12H   Continuous Infusions: . sodium chloride    . lactated ringers 100 mL/hr at 10/20/20 1217  . piperacillin-tazobactam (ZOSYN)  IV 3.375 g (10/20/20 1223)     Time spent: 35 minutes with over 50% of the time coordinating the patient's care    Jae DireJared E Eulalia Ellerman, DO [facility]   Call night coverage person covering after 7pm

## 2020-10-21 LAB — CULTURE, BLOOD (ROUTINE X 2)
Culture: NO GROWTH
Special Requests: ADEQUATE

## 2020-10-21 LAB — CBC WITH DIFFERENTIAL/PLATELET
Abs Immature Granulocytes: 0.94 10*3/uL — ABNORMAL HIGH (ref 0.00–0.07)
Basophils Absolute: 0.1 10*3/uL (ref 0.0–0.1)
Basophils Relative: 1 %
Eosinophils Absolute: 0 10*3/uL (ref 0.0–0.5)
Eosinophils Relative: 0 %
HCT: 31.6 % — ABNORMAL LOW (ref 36.0–46.0)
Hemoglobin: 10 g/dL — ABNORMAL LOW (ref 12.0–15.0)
Immature Granulocytes: 8 %
Lymphocytes Relative: 8 %
Lymphs Abs: 1 10*3/uL (ref 0.7–4.0)
MCH: 27 pg (ref 26.0–34.0)
MCHC: 31.6 g/dL (ref 30.0–36.0)
MCV: 85.4 fL (ref 80.0–100.0)
Monocytes Absolute: 0.2 10*3/uL (ref 0.1–1.0)
Monocytes Relative: 2 %
Neutro Abs: 9.4 10*3/uL — ABNORMAL HIGH (ref 1.7–7.7)
Neutrophils Relative %: 81 %
Platelets: 275 10*3/uL (ref 150–400)
RBC: 3.7 MIL/uL — ABNORMAL LOW (ref 3.87–5.11)
RDW: 16 % — ABNORMAL HIGH (ref 11.5–15.5)
WBC: 11.6 10*3/uL — ABNORMAL HIGH (ref 4.0–10.5)
nRBC: 0.4 % — ABNORMAL HIGH (ref 0.0–0.2)

## 2020-10-21 LAB — COMPREHENSIVE METABOLIC PANEL
ALT: 65 U/L — ABNORMAL HIGH (ref 0–44)
AST: 41 U/L (ref 15–41)
Albumin: 2.3 g/dL — ABNORMAL LOW (ref 3.5–5.0)
Alkaline Phosphatase: 186 U/L — ABNORMAL HIGH (ref 38–126)
Anion gap: 12 (ref 5–15)
BUN: 33 mg/dL — ABNORMAL HIGH (ref 8–23)
CO2: 27 mmol/L (ref 22–32)
Calcium: 9.1 mg/dL (ref 8.9–10.3)
Chloride: 106 mmol/L (ref 98–111)
Creatinine, Ser: 0.44 mg/dL (ref 0.44–1.00)
GFR, Estimated: >60 mL/min (ref >60)
Glucose, Bld: 98 mg/dL (ref 70–99)
Potassium: 4.2 mmol/L (ref 3.5–5.1)
Sodium: 145 mmol/L (ref 135–145)
Total Bilirubin: 0.3 mg/dL (ref 0.3–1.2)
Total Protein: 5.3 g/dL — ABNORMAL LOW (ref 6.5–8.1)

## 2020-10-21 LAB — FERRITIN: Ferritin: 124 ng/mL (ref 11–307)

## 2020-10-21 LAB — GLUCOSE, CAPILLARY
Glucose-Capillary: 72 mg/dL (ref 70–99)
Glucose-Capillary: 227 mg/dL — ABNORMAL HIGH (ref 70–99)
Glucose-Capillary: 257 mg/dL — ABNORMAL HIGH (ref 70–99)
Glucose-Capillary: 211 mg/dL — ABNORMAL HIGH (ref 70–99)

## 2020-10-21 LAB — C-REACTIVE PROTEIN: CRP: 3.6 mg/dL — ABNORMAL HIGH (ref <1.0)

## 2020-10-21 LAB — D-DIMER, QUANTITATIVE: D-Dimer, Quant: 3.67 ug/mL-FEU — ABNORMAL HIGH (ref 0.00–0.50)

## 2020-10-21 MED ORDER — ENOXAPARIN SODIUM 40 MG/0.4ML ~~LOC~~ SOLN
40.0000 mg | Freq: Two times a day (BID) | SUBCUTANEOUS | Status: DC
  Administered 2020-10-21 – 2020-10-23 (×4): 40 mg via SUBCUTANEOUS
  Filled 2020-10-21 (×4): qty 0.4

## 2020-10-21 NOTE — Progress Notes (Signed)
Daily Progress Note   Patient Name: Janice Little       Date: 10/21/2020 DOB: 07/22/1946  Age: 75 y.o. MRN#: [id number] Attending Physician: Tyrone Nine, MD Primary Care Physician: Juluis Rainier, MD Admit Date: 10/16/2020  Reason for Consultation/Follow-up: Establishing goals of care  Subjective:  noted to be resting comfortably, no distress  Length of Stay: 5  Current Medications: Scheduled Meds:  . amLODipine  5 mg Oral Daily  . aspirin  325 mg Oral Daily  . DULoxetine  60 mg Oral Daily  . enoxaparin (LOVENOX) injection  40 mg Subcutaneous Q24H  . feeding supplement  237 mL Oral BID BM  . insulin aspart  0-20 Units Subcutaneous TID WC  . insulin aspart  10 Units Subcutaneous TID WC  . insulin detemir  0.15 Units/kg Subcutaneous BID  . Ipratropium-Albuterol  1 puff Inhalation Q6H  . linagliptin  5 mg Oral Daily  . multivitamin with minerals  1 tablet Oral Daily  . mupirocin ointment  1 application Nasal BID  . predniSONE  50 mg Oral Daily  . sodium chloride flush  3 mL Intravenous Q12H    Continuous Infusions: . sodium chloride    . piperacillin-tazobactam (ZOSYN)  IV 3.375 g (10/21/20 0348)    PRN Meds: sodium chloride, acetaminophen, guaiFENesin-dextromethorphan, labetalol, lip balm, sodium chloride flush  Physical Exam         No distress Regular work of breathing No signs of uncontrolled pain  Vital Signs: BP (!) 154/81 (BP Location: Right Arm)   Pulse 100   Temp 99 F (37.2 C) (Axillary)   Resp 20   Ht 5\' 5"  (1.651 m)   Wt 70.3 kg   SpO2 96%   BMI 25.79 kg/m  SpO2: SpO2: 96 % O2 Device: O2 Device: Nasal Cannula O2 Flow Rate: O2 Flow Rate (L/min): 2 L/min  Intake/output summary:   Intake/Output Summary (Last 24 hours) at 10/21/2020 1249 Last  data filed at 10/21/2020 0503 Gross per 24 hour  Intake 150 ml  Output 750 ml  Net -600 ml   LBM: Last BM Date: 10/20/20 Baseline Weight: Weight: 70.3 kg Most recent weight: Weight: 70.3 kg       Palliative Assessment/Data:      Patient Active Problem List   Diagnosis Date Noted  . Acute cholecystitis  10/20/2020  . Acute hypoxemic respiratory failure due to COVID-19 (HCC) 10/16/2020  . Acute metabolic encephalopathy 10/16/2020  . Gross hematuria   . Acute sepsis (HCC)   . Sepsis secondary to UTI (HCC)   . Diarrhea   . Elevated sed rate 08/12/2019  . Elevated C-reactive protein (CRP) 08/12/2019  . Left-sided neglect 08/01/2019  . Myalgia 08/01/2019  . Pressure injury of skin 03/04/2019  . Acute lower UTI   . Stroke (HCC) 03/02/2019  . Acute CVA (cerebrovascular accident) (HCC) 03/01/2019  . Hypertensive urgency 12/19/2018  . History of myelitis 12/26/2017  . History of optic neuritis 04/02/2015  . Diplopia 04/02/2015  . Urinary incontinence 04/02/2015  . Other fatigue 04/02/2015  . 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 Numbness 04/02/2015  . Ataxic gait 04/02/2015  . Essential tremor 04/02/2015  . PRIMARY HYPERPARATHYROIDISM 02/01/2008  . OSTEOPOROSIS 02/01/2008  . Type 2 diabetes mellitus with hypoglycemia without coma (HCC) 01/31/2008  . Hyperlipidemia 01/31/2008  . Multiple sclerosis (HCC) 01/31/2008  . Optic neuritis 01/31/2008  . Essential hypertension 01/31/2008  . 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  Patient remains on appropriate treatment regimen protocols with remdesivir, antibiotics and steroids.  Palliative care consulted for goals of care discussions.  Recommendations/Plan:  Chart reviewed, discussed with members of interdisciplinary team.  Recommend continuation of current mode of care.  Patient's antibiotics were adjusted due to abdominal imaging showing possible acute cholecystitis. Patient has been seen by surgery, underwent HIDA scan, to continue antibiotics and to be considered for percutaneous cholecystostomy tube is worsening of symptoms. Call placed and discussed with sister, she is thankful for updates that are being provided by surgery as well as TRH services. She states that patient was told she has hepatitis, as a child. Sister wishes to continue current mode of care.    Code Status:    Code Status Orders  (From admission, onward)         Start     Ordered   10/16/20 1748  Limited resuscitation (code)  Continuous       Question Answer Comment  In the event of cardiac or respiratory ARREST: Initiate Code Blue, Call Rapid Response Yes   In the event of cardiac or respiratory ARREST: Perform CPR Yes   In the event of cardiac or respiratory ARREST: Perform Intubation/Mechanical Ventilation No   In the event of cardiac or respiratory ARREST: Use NIPPV/BiPAp only if indicated Yes   In the event of cardiac or respiratory ARREST: Administer ACLS medications if indicated Yes   In the event of cardiac or respiratory ARREST: Perform Defibrillation or Cardioversion if indicated Yes      10/16/20 1747        Code Status History    Date Active Date Inactive Code Status Order ID Comments User Context   09/16/2019 1138 09/25/2019 0333 Full Code [id number]  Josephine Igo, DO ED   03/01/2019 0133 03/06/2019 2142 Full Code [id number]  Eduard Clos, MD ED   12/19/2018 1951 12/26/2018 1602 Full Code [id number]  Elgergawy, Leana Roe, MD ED  Advance  Care Planning Activity       Prognosis:   guarded.   Discharge Planning:  Recommend d/c back to [facility] with palliative support.   Care plan was discussed with IDT.   Thank you for allowing the Palliative Medicine Team to assist in the care of this patient.   Time In: 12 Time Out: 12.25 Total Time 25 Prolonged Time Billed No.       Greater than 50%  of this time was spent counseling and coordinating care related to the above assessment and plan.  Rosalin Hawking, MD  Please contact Palliative Medicine Team phone at [phone] for questions and concerns.

## 2020-10-21 NOTE — Progress Notes (Signed)
PROGRESS NOTE  Janice Little  ITJ:[id number] DOB: 1945/12/08 DOA: 10/16/2020 PCP: Juluis Rainier, MD   Brief Narrative: Janice Little is a 75 y.o. 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 Found to be afebrile, tachycardic, tachypneic, hypoxic requiring 5L O2. CXR revealed patchy bilateral airspace opacities. Remdesivir and decadron were given on admission. LFTs trended upward, RUQ U/S suggestive of cholecystitis with subsequent HIDA scan showing delayed filling of the gallbladder more consistent with chronic cholecystitis. LFTs are stabilizing and surgery has recommended a week of antibiotics, conservative management. Palliative discussions are ongoing.  Assessment & Plan: Principal Problem:   Acute hypoxemic respiratory failure due to COVID-19 [facility]) Active Problems:   Type 2 diabetes mellitus with hypoglycemia without coma (HCC)   Essential hypertension   Elevated C-reactive protein (CRP)   Acute sepsis (HCC)   Acute metabolic encephalopathy   Acute cholecystitis  Sepsis due to aspiration pneumonia: POA, resolved.  - Abx changed to zosyn, will continue.  - Cultures with S. hominis in 1 of 4 bottles felt to be consistent with contaminant.  - SLP evaluation performed, continue dysphagia diet  Acute hypoxemic respiratory failure due to covid-19 pneumonia: SARS-CoV-2 testing positive on 1/10.  - Completed remdesivir - Continue steroids, CRP responding. Remains hypoxic. Will check exertional pulse oximetry (pt is bedbound, unable to perform ambulatory check) - Held baricitinib due to concomitant  - Encourage OOB, IS, FV, and awake proning if able - Continue airborne, contact precautions for 21 days from positive  testing. - Monitor CMP and inflammatory markers - Enoxaparin, increase to 40mg  q12h with persistent d-dimer elevation.  Chronic cholecystitis: HIDA with delayed GB filling, not consistent with acute cholecystitis.  - LFTs improving, WBC trending downward. Will continue abx x1 week.  - If worsening symptoms/LFTs, would warrant IR consult for cholecystostomy drain.   Acute metabolic encephalopathy: Resolved, not at suspected baseline.  - Palliative care following  Hypernatremia: Improved.  - Encourage po free water  HTN:  - Continue norvasc.   T2DM with hypoglycemia, now steroid-induced hyperglycemia:  - Continue current management  History of CVA:  - Continue ASA  DVT prophylaxis: Lovenox Code Status: Partial Family Communication: None at bedside Disposition Plan:  Status is: Inpatient  Remains inpatient appropriate because:IV treatments appropriate due to intensity of illness or inability to take PO  Dispo:  Patient From: Skilled Nursing Facility  Planned Disposition: Skilled Nursing Facility  Expected discharge date: 10/21/2020  Medically stable for discharge: No  Consultants:   SLP  Palliative care  Procedures:   None  Antimicrobials:  Ceftriaxone, azithromycin > zosyn  Remdesivir   Subjective: No new complaints, denies dyspnea or pain.   Objective: Vitals:   10/20/20 2022 10/20/20 2100 10/21/20 0503 10/21/20 1355  BP: (!) 153/87 (!) 150/80 (!) 154/81 (!) 157/93  Pulse:  (!) 101 100 94  Resp:    18  Temp:  98 F (36.7 C) 99 F (37.2 C) 98.1 F (36.7 C)  TempSrc:  Axillary Axillary Axillary  SpO2:  100% 96% 99%  Weight:      Height:        Intake/Output Summary (Last 24 hours) at 10/21/2020 1608 Last data filed at 10/21/2020 1519 Gross per 24 hour  Intake 810 ml  Output 1050 ml  Net -240 ml   [employer]  10/16/20 1944  Weight: 70.3 kg    Gen: 75 y.o. female in no distress, confused. Pulm: Non-labored breathing supplemental oxygen.  Clear to auscultation bilaterally.  CV: Regular rate and rhythm. No murmur, rub, or gallop. No JVD, no pitting pedal edema. GI: Abdomen soft, not tender, non-distended, with normoactive bowel sounds. No organomegaly or masses felt. Ext: Warm, no deformities Skin: No rashes, lesions or ulcers on visualized skin Neuro: Alert and incompletely oriented, left hemiparesis noted.  Psych: Judgement and insight appear impaired.  Data Reviewed: I have personally reviewed following labs and imaging studies  CBC: Recent Labs  Lab 10/17/20 0458 10/18/20 0357 10/19/20 0353 10/20/20 0407 10/21/20 0423  WBC 11.3* 7.0 7.5 11.9* 11.6*  NEUTROABS 10.4* 5.7 6.0 9.4* 9.4*  HGB 10.2* 9.4* 9.6* 10.5* 10.0*  HCT 33.7* 30.4* 31.7* 34.0* 31.6*  MCV 88.9 88.4 88.1 87.9 85.4  PLT 228 244 270 265 275   Basic Metabolic Panel: Recent Labs  Lab 10/17/20 0458 10/18/20 0357 10/19/20 0353 10/20/20 0400 10/20/20 0407 10/21/20 0423  NA 146* 147* 148*  --  146* 145  K 3.9 3.1* 3.3*  --  3.1* 4.2  CL 107 107 110  --  110 106  CO2 26 25 24   --  25 27  GLUCOSE 74 303* 447*  --  167* 98  BUN 44* 48* 42*  --  37* 33*  CREATININE 0.64 0.76 0.70  --  0.57 0.44  CALCIUM 8.7* 8.5* 8.9  --  9.0 9.1  MG  --   --   --  1.9  --   --    GFR: Estimated Creatinine Clearance: 58.8 mL/min (by C-G formula based on SCr of 0.44 mg/dL). Liver Function Tests: Recent Labs  Lab 10/17/20 0458 10/18/20 0357 10/19/20 0353 10/20/20 0407 10/21/20 0423  AST 35 31 29 67* 41  ALT 35 38 41 57* 65*  ALKPHOS 112 117 157* 178* 186*  BILITOT 0.4 0.5 0.3 0.5 0.3  PROT 5.7* 5.3* 5.6* 5.5* 5.3*  ALBUMIN 2.4* 2.3* 2.4* 2.5* 2.3*   No results for input(s): LIPASE, AMYLASE in the last 168 hours. No results for input(s): AMMONIA in the last 168 hours. Coagulation Profile: No results for input(s): INR, PROTIME in the last 168 hours. Cardiac Enzymes: No results for input(s): CKTOTAL, CKMB, CKMBINDEX, TROPONINI in the last 168  hours. BNP (last 3 results) No results for input(s): PROBNP in the last 8760 hours. HbA1C: No results for input(s): HGBA1C in the last 72 hours. CBG: Recent Labs  Lab 10/20/20 1152 10/20/20 1708 10/20/20 1954 10/21/20 0753 10/21/20 1135  GLUCAP 247* 161* 91 72 227*   Lipid Profile: No results for input(s): CHOL, HDL, LDLCALC, TRIG, CHOLHDL, LDLDIRECT in the last 72 hours. Thyroid Function Tests: No results for input(s): TSH, T4TOTAL, FREET4, T3FREE, THYROIDAB in the last 72 hours. Anemia Panel: Recent Labs    10/20/20 0407 10/21/20 0423  FERRITIN 190 124   Urine analysis:    Component Value Date/Time   COLORURINE AMBER (A) 10/16/2020 1526   APPEARANCEUR CLOUDY (A) 10/16/2020 1526   LABSPEC 1.021 10/16/2020 1526   PHURINE 5.0 10/16/2020 1526   GLUCOSEU NEGATIVE 10/16/2020 1526   HGBUR NEGATIVE 10/16/2020 1526   BILIRUBINUR NEGATIVE 10/16/2020 1526   KETONESUR NEGATIVE 10/16/2020 1526   PROTEINUR 30 (A) 10/16/2020 1526   UROBILINOGEN 0.2 11/19/2009 1055   NITRITE NEGATIVE 10/16/2020 1526   LEUKOCYTESUR NEGATIVE 10/16/2020 1526   Recent Results (from the past 240 hour(s))  Blood Culture (routine  x 2)     Status: None   Collection Time: 10/16/20  3:25 PM   Specimen: BLOOD LEFT FOREARM  Result Value Ref Range Status   Specimen Description   Final    BLOOD LEFT FOREARM Performed at [facility], [address] [place], [place] [address]    Special Requests   Final    BOTTLES DRAWN AEROBIC AND ANAEROBIC Blood Culture adequate volume Performed at [facility], [address] [place], [place] [address]    Culture   Final    NO GROWTH 5 DAYS Performed at [facility], [address] [place], [place] [address]    Report Status 10/21/2020 FINAL  Final  Blood Culture (routine x 2)     Status: Abnormal   Collection Time: 10/16/20  3:30 PM   Specimen: BLOOD  Result Value Ref Range Status   Specimen Description BLOOD LEFT  ANTECUBITAL  Final   Special Requests   Final    BOTTLES DRAWN AEROBIC AND ANAEROBIC Blood Culture adequate volume   Culture  Setup Time   Final    GRAM POSITIVE COCCI IN CLUSTERS ANAEROBIC BOTTLE ONLY CRITICAL RESULT CALLED TO, READ BACK BY AND VERIFIED WITH: PHARMD N GLOGOVAC AT 1510 10/17/2020 BY L BENFIELD    Culture (A)  Final    STAPHYLOCOCCUS HOMINIS THE SIGNIFICANCE OF ISOLATING THIS ORGANISM FROM A SINGLE SET OF BLOOD CULTURES WHEN MULTIPLE SETS ARE DRAWN IS UNCERTAIN. PLEASE NOTIFY THE MICROBIOLOGY DEPARTMENT WITHIN ONE WEEK IF SPECIATION AND SENSITIVITIES ARE REQUIRED.    Report Status 10/19/2020 FINAL  Final  Blood Culture ID Panel (Reflexed)     Status: Abnormal   Collection Time: 10/16/20  3:30 PM  Result Value Ref Range Status   Enterococcus faecalis NOT DETECTED NOT DETECTED Final   Enterococcus Faecium NOT DETECTED NOT DETECTED Final   Listeria monocytogenes NOT DETECTED NOT DETECTED Final   Staphylococcus species DETECTED (A) NOT DETECTED Final    Comment: CRITICAL RESULT CALLED TO, READ BACK BY AND VERIFIED WITH: PHARMD N GLOGOVAC AT 1510 10/17/2020 BY L BENFIELD    Staphylococcus aureus (BCID) NOT DETECTED NOT DETECTED Final   Staphylococcus epidermidis NOT DETECTED NOT DETECTED Final   Staphylococcus lugdunensis NOT DETECTED NOT DETECTED Final   Streptococcus species NOT DETECTED NOT DETECTED Final   Streptococcus agalactiae NOT DETECTED NOT DETECTED Final   Streptococcus pneumoniae NOT DETECTED NOT DETECTED Final   Streptococcus pyogenes NOT DETECTED NOT DETECTED Final   A.calcoaceticus-baumannii NOT DETECTED NOT DETECTED Final   Bacteroides fragilis NOT DETECTED NOT DETECTED Final   Enterobacterales NOT DETECTED NOT DETECTED Final   Enterobacter cloacae complex NOT DETECTED NOT DETECTED Final   Escherichia coli NOT DETECTED NOT DETECTED Final   Klebsiella aerogenes NOT DETECTED NOT DETECTED Final   Klebsiella oxytoca NOT DETECTED NOT DETECTED Final    Klebsiella pneumoniae NOT DETECTED NOT DETECTED Final   Proteus species NOT DETECTED NOT DETECTED Final   Salmonella species NOT DETECTED NOT DETECTED Final   Serratia marcescens NOT DETECTED NOT DETECTED Final   Haemophilus influenzae NOT DETECTED NOT DETECTED Final   Neisseria meningitidis NOT DETECTED NOT DETECTED Final   Pseudomonas aeruginosa NOT DETECTED NOT DETECTED Final   Stenotrophomonas maltophilia NOT DETECTED NOT DETECTED Final   Candida albicans NOT DETECTED NOT DETECTED Final   Candida auris NOT DETECTED NOT DETECTED Final   Candida glabrata NOT DETECTED NOT DETECTED Final   Candida krusei NOT DETECTED NOT DETECTED Final   Candida  parapsilosis NOT DETECTED NOT DETECTED Final   Candida tropicalis NOT DETECTED NOT DETECTED Final   Cryptococcus neoformans/gattii NOT DETECTED NOT DETECTED Final    Comment: Performed at [facility], [address] [place], [place] [address]  MRSA PCR Screening     Status: Abnormal   Collection Time: 10/16/20  9:08 PM   Specimen: Nasopharyngeal  Result Value Ref Range Status   MRSA by PCR POSITIVE (A) NEGATIVE Final    Comment:        The GeneXpert MRSA Assay (FDA approved for NASAL specimens only), is one component of a comprehensive MRSA colonization surveillance program. It is not intended to diagnose MRSA infection nor to guide or monitor treatment for MRSA infections. CRITICAL RESULT CALLED TO, READ BACK BY AND VERIFIED WITH: PAM HAMILTON RN 10/16/2020 @2249  BY P.HENDERSON Performed at [facility], [address], [place], [place]       Radiology Studies: NM Hepatobiliary Liver Func  Result Date: 10/20/2020 CLINICAL DATA:  Abdominal pain. EXAM: NUCLEAR MEDICINE HEPATOBILIARY IMAGING TECHNIQUE: Sequential images of the abdomen were obtained out to 60 minutes following intravenous administration of radiopharmaceutical. RADIOPHARMACEUTICALS:  5.5 mCi Tc-82m  Choletec IV COMPARISON:  Right upper  quadrant sonogram 10/20/2020 and CTA chest 10/17/2020 FINDINGS: Prompt uptake and biliary excretion of activity by the liver is seen. During the first hour biliary activity passes into small bowel, but no gallbladder activity was visualized. The patient then received 3 mg of morphine IV bolus and subsequent imaging was performed for an additional 30 minutes. Gallbladder activity appears following the administration of morphine. IMPRESSION: 1. Delayed filling of the gallbladder consistent with chronic calculus cholecystitis. 2. Patent common bile duct. Electronically Signed   By: 10/19/2020 M.D.   On: 10/20/2020 16:40   10/22/2020 Abdomen Limited RUQ (LIVER/GB)  Result Date: 10/20/2020 CLINICAL DATA:  Elevated LFTs EXAM: ULTRASOUND ABDOMEN LIMITED RIGHT UPPER QUADRANT COMPARISON:  CT of the chest dated October 17, 2020 FINDINGS: Gallbladder: Wall thickening is noted with "wall echo shadow" sign. Tenderness reported over the gallbladder by the sonographer. The lumen of the gallbladder is filled with calculi such that the posterior wall of the gallbladder is not evaluated. Common bile duct: Diameter: Not visualized due to limitations from shadowing arising from the calculus filled gallbladder and patient body habitus. Liver: Limited assessment of the liver due to patient body habitus. No gross lesion on submitted images. Portal vein is patent on color Doppler imaging with normal direction of blood flow towards the liver. Other: None. IMPRESSION: Ultrasound findings of acute cholecystitis with reported tenderness over the gallbladder in the setting of cholelithiasis with numerous gallstones filling the lumen of the gallbladder. Common bile duct not evaluated due to abundant shadowing from stone filled gallbladder. Electronically Signed   By: October 19, 2020 M.D.   On: 10/20/2020 09:00    Scheduled Meds: . amLODipine  5 mg Oral Daily  . aspirin  325 mg Oral Daily  . DULoxetine  60 mg Oral Daily  . enoxaparin  (LOVENOX) injection  40 mg Subcutaneous Q24H  . feeding supplement  237 mL Oral BID BM  . insulin aspart  0-20 Units Subcutaneous TID WC  . insulin aspart  10 Units Subcutaneous TID WC  . insulin detemir  0.15 Units/kg Subcutaneous BID  . Ipratropium-Albuterol  1 puff Inhalation Q6H  . linagliptin  5 mg Oral Daily  . multivitamin with minerals  1 tablet Oral Daily  . predniSONE  50 mg Oral  Daily  . sodium chloride flush  3 mL Intravenous Q12H   Continuous Infusions: . sodium chloride    . piperacillin-tazobactam (ZOSYN)  IV 3.375 g (10/21/20 1501)     LOS: 5 days   Time spent: 25 minutes.  Tyrone Nine, MD [facility] [email or web address] 10/21/2020, 4:08 PM

## 2020-10-21 NOTE — Progress Notes (Signed)
Subjective: CC: Patient reports no abdominal pain, n/v. She reports she did eat yesterday. She is currently on a dys 1 diet.   Objective: Vital signs in last 24 hours: Temp:  [97.8 F (36.6 C)-99 F (37.2 C)] 99 F (37.2 C) (01/19 0503) Pulse Rate:  [100-104] 100 (01/19 0503) Resp:  [14-20] 20 (01/18 1957) BP: (150-182)/(80-95) 154/81 (01/19 0503) SpO2:  [96 %-100 %] 96 % (01/19 0503) Last BM Date: 10/20/20  Intake/Output from previous day: 01/18 0701 - 01/19 0700 In: 150 [I.V.:50; IV Piggyback:100] Out: 750 [Urine:750] Intake/Output this shift: No intake/output data recorded.  PE: Gen:  NAD Card:  Reg Pulm:  Normal rate and effort  Abd: Soft, mild distension, no rigidity or guarding. She reports generalized abdominal tenderness, +BS  Lab Results:  Recent Labs    10/20/20 0407 10/21/20 0423  WBC 11.9* 11.6*  HGB 10.5* 10.0*  HCT 34.0* 31.6*  PLT 265 275   BMET Recent Labs    10/20/20 0407 10/21/20 0423  NA 146* 145  K 3.1* 4.2  CL 110 106  CO2 25 27  GLUCOSE 167* 98  BUN 37* 33*  CREATININE 0.57 0.44  CALCIUM 9.0 9.1   PT/INR No results for input(s): LABPROT, INR in the last 72 hours. CMP     Component Value Date/Time   NA 145 10/21/2020 0423   K 4.2 10/21/2020 0423   CL 106 10/21/2020 0423   CO2 27 10/21/2020 0423   GLUCOSE 98 10/21/2020 0423   BUN 33 (H) 10/21/2020 0423   CREATININE 0.44 10/21/2020 0423   CALCIUM 9.1 10/21/2020 0423   PROT 5.3 (L) 10/21/2020 0423   ALBUMIN 2.3 (L) 10/21/2020 0423   AST 41 10/21/2020 0423   ALT 65 (H) 10/21/2020 0423   ALKPHOS 186 (H) 10/21/2020 0423   BILITOT 0.3 10/21/2020 0423   GFRNONAA >60 10/21/2020 0423   GFRAA >60 09/19/2019 0811   Lipase     Component Value Date/Time   LIPASE 23 09/16/2019 0825       Studies/Results: NM Hepatobiliary Liver Func  Result Date: 10/20/2020 CLINICAL DATA:  Abdominal pain. EXAM: NUCLEAR MEDICINE HEPATOBILIARY IMAGING TECHNIQUE: Sequential images of  the abdomen were obtained out to 60 minutes following intravenous administration of radiopharmaceutical. RADIOPHARMACEUTICALS:  5.5 mCi Tc-29m  Choletec IV COMPARISON:  Right upper quadrant sonogram 10/20/2020 and CTA chest 10/17/2020 FINDINGS: Prompt uptake and biliary excretion of activity by the liver is seen. During the first hour biliary activity passes into small bowel, but no gallbladder activity was visualized. The patient then received 3 mg of morphine IV bolus and subsequent imaging was performed for an additional 30 minutes. Gallbladder activity appears following the administration of morphine. IMPRESSION: 1. Delayed filling of the gallbladder consistent with chronic calculus cholecystitis. 2. Patent common bile duct. Electronically Signed   By: Signa Kell M.D.   On: 10/20/2020 16:40   US Abdomen Limited RUQ (LIVER/GB)  Result Date: 10/20/2020 CLINICAL DATA:  Elevated LFTs EXAM: ULTRASOUND ABDOMEN LIMITED RIGHT UPPER QUADRANT COMPARISON:  CT of the chest dated October 17, 2020 FINDINGS: Gallbladder: Wall thickening is noted with "wall echo shadow" sign. Tenderness reported over the gallbladder by the sonographer. The lumen of the gallbladder is filled with calculi such that the posterior wall of the gallbladder is not evaluated. Common bile duct: Diameter: Not visualized due to limitations from shadowing arising from the calculus filled gallbladder and patient body habitus. Liver: Limited assessment of the liver due to patient  body habitus. No gross lesion on submitted images. Portal vein is patent on color Doppler imaging with normal direction of blood flow towards the liver. Other: None. IMPRESSION: Ultrasound findings of acute cholecystitis with reported tenderness over the gallbladder in the setting of cholelithiasis with numerous gallstones filling the lumen of the gallbladder. Common bile duct not evaluated due to abundant shadowing from stone filled gallbladder. Electronically Signed   By:  Donzetta Kohut M.D.   On: 10/20/2020 09:00    Anti-infectives: Anti-infectives (From admission, onward)   Start     Dose/Rate Route Frequency Ordered Stop   10/20/20 1200  piperacillin-tazobactam (ZOSYN) IVPB 3.375 g        3.375 g 12.5 mL/hr over 240 Minutes Intravenous Every 8 hours 10/20/20 1138     10/18/20 1000  vancomycin (VANCOREADY) IVPB 1500 mg/300 mL  Status:  Discontinued        1,500 mg 150 mL/hr over 120 Minutes Intravenous Every 24 hours 10/17/20 0957 10/19/20 1148   10/17/20 1200  cefTRIAXone (ROCEPHIN) 1 g in sodium chloride 0.9 % 100 mL IVPB  Status:  Discontinued        1 g 200 mL/hr over 30 Minutes Intravenous Every 24 hours 10/17/20 1030 10/20/20 1059   10/17/20 1130  azithromycin (ZITHROMAX) 500 mg in sodium chloride 0.9 % 250 mL IVPB  Status:  Discontinued        500 mg 250 mL/hr over 60 Minutes Intravenous Every 24 hours 10/17/20 1030 10/20/20 1059   10/17/20 1000  remdesivir 100 mg in sodium chloride 0.9 % 100 mL IVPB       "Followed by" Linked Group Details   100 mg 200 mL/hr over 30 Minutes Intravenous Daily 10/16/20 1707 10/20/20 0856   10/17/20 0900  vancomycin (VANCOREADY) IVPB 1750 mg/350 mL        1,750 mg 175 mL/hr over 120 Minutes Intravenous  Once 10/17/20 0759 10/17/20 1338   10/16/20 1800  remdesivir 200 mg in sodium chloride 0.9% 250 mL IVPB       "Followed by" Linked Group Details   200 mg 580 mL/hr over 30 Minutes Intravenous Once 10/16/20 1707 10/16/20 1854   10/16/20 1800  cefTRIAXone (ROCEPHIN) 1 g in sodium chloride 0.9 % 100 mL IVPB  Status:  Discontinued        1 g 200 mL/hr over 30 Minutes Intravenous Every 24 hours 10/16/20 1747 10/17/20 1030   10/16/20 1800  azithromycin (ZITHROMAX) 500 mg in sodium chloride 0.9 % 250 mL IVPB  Status:  Discontinued        500 mg 250 mL/hr over 60 Minutes Intravenous Every 24 hours 10/16/20 1747 10/17/20 1030       Assessment/Plan Sepsis without septic shock - resolved  COVID 19 PNA 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 Given the delayed filling of the gallbladder, patient may have chronic cholecystitis. She is currently afebrile and WBC is 11.6 (down from 11.9) and LFT's are downtrending. After discussion with attending, would recommend covering with 1 week of abx.  - Given multiple medical co-morbidities and active COVID, if patient was to worsen would recommend percutaneous cholecystostomy tube, as surgery would be an increased risk of perioperative morbidity to the patient. - Agree with continued GOC  discussion  FEN - Can adv diet per speech recs VTE - SCDs, Loevonx, ASA ID - Zosyn   LOS: 5 days    Jacinto Halim , [facility] 10/21/2020, 9:45 AM Please see Amion for pager number during day hours 7:00am-4:30pm

## 2020-10-21 NOTE — Plan of Care (Signed)
  Problem: Clinical Measurements: Goal: Respiratory complications will improve Outcome: Progressing   Problem: Nutrition: Goal: Adequate nutrition will be maintained Outcome: Progressing   Problem: Pain Managment: Goal: General experience of comfort will improve Outcome: Progressing   Problem: Safety: Goal: Ability to remain free from injury will improve Outcome: Progressing   Problem: Skin Integrity: Goal: Risk for impaired skin integrity will decrease Outcome: Progressing   

## 2020-10-22 LAB — CBC WITH DIFFERENTIAL/PLATELET
Abs Immature Granulocytes: 0.89 10*3/uL — ABNORMAL HIGH (ref 0.00–0.07)
Basophils Absolute: 0.1 10*3/uL (ref 0.0–0.1)
Basophils Relative: 0 %
Eosinophils Absolute: 0 10*3/uL (ref 0.0–0.5)
Eosinophils Relative: 0 %
HCT: 32.3 % — ABNORMAL LOW (ref 36.0–46.0)
Hemoglobin: 10 g/dL — ABNORMAL LOW (ref 12.0–15.0)
Immature Granulocytes: 7 %
Lymphocytes Relative: 7 %
Lymphs Abs: 0.9 10*3/uL (ref 0.7–4.0)
MCH: 27.1 pg (ref 26.0–34.0)
MCHC: 31 g/dL (ref 30.0–36.0)
MCV: 87.5 fL (ref 80.0–100.0)
Monocytes Absolute: 0.2 10*3/uL (ref 0.1–1.0)
Monocytes Relative: 2 %
Neutro Abs: 10.1 10*3/uL — ABNORMAL HIGH (ref 1.7–7.7)
Neutrophils Relative %: 84 %
Platelets: 271 10*3/uL (ref 150–400)
RBC: 3.69 MIL/uL — ABNORMAL LOW (ref 3.87–5.11)
RDW: 16 % — ABNORMAL HIGH (ref 11.5–15.5)
WBC: 12.2 10*3/uL — ABNORMAL HIGH (ref 4.0–10.5)
nRBC: 0.4 % — ABNORMAL HIGH (ref 0.0–0.2)

## 2020-10-22 LAB — GLUCOSE, CAPILLARY
Glucose-Capillary: >600 mg/dL (ref 70–99)
Glucose-Capillary: 46 mg/dL — ABNORMAL LOW (ref 70–99)
Glucose-Capillary: 97 mg/dL (ref 70–99)
Glucose-Capillary: 222 mg/dL — ABNORMAL HIGH (ref 70–99)
Glucose-Capillary: 347 mg/dL — ABNORMAL HIGH (ref 70–99)
Glucose-Capillary: 381 mg/dL — ABNORMAL HIGH (ref 70–99)
Glucose-Capillary: 293 mg/dL — ABNORMAL HIGH (ref 70–99)

## 2020-10-22 LAB — COMPREHENSIVE METABOLIC PANEL
ALT: 100 U/L — ABNORMAL HIGH (ref 0–44)
AST: 74 U/L — ABNORMAL HIGH (ref 15–41)
Albumin: 2.2 g/dL — ABNORMAL LOW (ref 3.5–5.0)
Alkaline Phosphatase: 167 U/L — ABNORMAL HIGH (ref 38–126)
Anion gap: 9 (ref 5–15)
BUN: 31 mg/dL — ABNORMAL HIGH (ref 8–23)
CO2: 28 mmol/L (ref 22–32)
Calcium: 9.1 mg/dL (ref 8.9–10.3)
Chloride: 106 mmol/L (ref 98–111)
Creatinine, Ser: 0.53 mg/dL (ref 0.44–1.00)
GFR, Estimated: >60 mL/min (ref >60)
Glucose, Bld: 82 mg/dL (ref 70–99)
Potassium: 4.2 mmol/L (ref 3.5–5.1)
Sodium: 143 mmol/L (ref 135–145)
Total Bilirubin: 0.4 mg/dL (ref 0.3–1.2)
Total Protein: 4.7 g/dL — ABNORMAL LOW (ref 6.5–8.1)

## 2020-10-22 LAB — C-REACTIVE PROTEIN: CRP: 1.5 mg/dL — ABNORMAL HIGH (ref <1.0)

## 2020-10-22 LAB — PROCALCITONIN: Procalcitonin: <0.1 ng/mL

## 2020-10-22 LAB — D-DIMER, QUANTITATIVE: D-Dimer, Quant: 2.95 ug/mL-FEU — ABNORMAL HIGH (ref 0.00–0.50)

## 2020-10-22 MED ORDER — IPRATROPIUM-ALBUTEROL 20-100 MCG/ACT IN AERS: 1.0000 | INHALATION_SPRAY | Freq: Four times a day (QID) | RESPIRATORY_TRACT | Status: DC | PRN

## 2020-10-22 MED ORDER — DEXAMETHASONE 4 MG PO TABS
6.0000 mg | ORAL_TABLET | Freq: Every day | ORAL | Status: DC
  Administered 2020-10-23: 6 mg via ORAL
  Filled 2020-10-22: qty 1

## 2020-10-22 MED ORDER — INSULIN ASPART 100 UNIT/ML ~~LOC~~ SOLN
6.0000 [IU] | Freq: Three times a day (TID) | SUBCUTANEOUS | Status: DC
  Administered 2020-10-22 – 2020-10-23 (×5): 6 [IU] via SUBCUTANEOUS

## 2020-10-22 NOTE — Plan of Care (Signed)
  Problem: Clinical Measurements: Goal: Respiratory complications will improve Outcome: Progressing Goal: Cardiovascular complication will be avoided Outcome: Progressing   Problem: Nutrition: Goal: Adequate nutrition will be maintained Outcome: Progressing   Problem: Elimination: Goal: Will not experience complications related to bowel motility Outcome: Progressing Goal: Will not experience complications related to urinary retention Outcome: Progressing   

## 2020-10-22 NOTE — Progress Notes (Signed)
Chart reviewed  Not an appropriate surgical candidate and HIDA   Shows delayed filing of GB  Cont ABX  We will follow from periphery for now  If she worsens, recommend perc cholecystomy if she worsens

## 2020-10-22 NOTE — Progress Notes (Addendum)
Nutrition Follow-up  RD working remotely.  DOCUMENTATION CODES:   Not applicable  INTERVENTION:  - continue Ensure Enlive BID and Magic Cup TID.  - complete NFPE when feasible.  - floor staff to continue to provide feeding assistance, if warranted.   NUTRITION DIAGNOSIS:   Increased nutrient needs related to acute illness (COVID-19) as evidenced by estimated needs. 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  Patient was diagnosed with COVID-19 4 days ago and has been having increased weakness and confusion.  Patient is bedbound at baseline.  Patient has been coughing but unsure if any fever.  Patient denies any specific complaints currently.  Limited meal completions documented: 40% of dinner on 1/15; 50% of breakfast, 20% of lunch, and 30% of dinner on 1/19. She has been accepting Ensure supplements 90% of the time offered.   She has not been weighed since admission on 1/14.   Per notes: - COVID-19 PNA - sepsis--resolved - chronic cholecystitis - acute metabolic encephalopathy--resolved    Labs reviewed; CBG: 46 mg/dl, BUN: 31 mg/dl, Alk Phos elevated, LFTs elevated.  Medications reviewed; sliding scale novolog, 10 units novolog TID, 11 units levemir BID, 5 mg tradjenta/day, 1 tablet multivitamin with minerals/day, 50 mg deltasone/day.     NUTRITION - FOCUSED PHYSICAL EXAM:  unable to complete at this time.   Diet Order:   Diet Order            DIET - DYS 1 Room service appropriate? No;  Fluid consistency: Thin  Diet effective now                 EDUCATION NEEDS:   No education needs have been identified at this time  Skin:  Skin Assessment: Reviewed RN Assessment  Last BM:  1/18 (type 6 x1)  Height:   Ht Readings from Last 1 Encounters:  10/16/20 _0  (1.651 m)    Weight:   Wt Readings from Last 1 Encounters:  10/16/20 70.3 kg    Estimated Nutritional Needs:  Kcal:  2000-2200 Protein:  110-125 grams Fluid:  > 2 L      Jarome Matin, MS, RD, LDN, CNSC Inpatient Clinical Dietitian RD pager # available in AMION  After hours/weekend pager # available in [facility]

## 2020-10-22 NOTE — Plan of Care (Signed)
Pt BG was 46 this am, appeared asymptomatic & was able to safely take PO. Assisted pt in drinking 2 juices. BG up to 97. Will continue to monitor.

## 2020-10-22 NOTE — Progress Notes (Signed)
PROGRESS NOTE  Janice Little  MWU:[id number] DOB: 13-May-1946 DOA: 10/16/2020 PCP: Juluis Rainier, MD   Brief Narrative: Janice Little is a 75 y.o. 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 Found to be afebrile, tachycardic, tachypneic, hypoxic requiring 5L O2. CXR revealed patchy bilateral airspace opacities. Remdesivir and decadron were given on admission. LFTs trended upward, RUQ U/S suggestive of cholecystitis with subsequent HIDA scan showing delayed filling of the gallbladder more consistent with chronic cholecystitis. LFTs are stabilizing and surgery has recommended a week of antibiotics, conservative management. Palliative discussions are ongoing.  Assessment & Plan: Principal Problem:   Acute hypoxemic respiratory failure due to COVID-19 [facility]) Active Problems:   Type 2 diabetes mellitus with hypoglycemia without coma (HCC)   Essential hypertension   Elevated C-reactive protein (CRP)   Acute sepsis (HCC)   Acute metabolic encephalopathy   Acute cholecystitis  Sepsis due to aspiration pneumonia: POA, resolved.  - Abx changed to zosyn. PCT is negative now. - Cultures with S. hominis in 1 of 4 bottles felt to be consistent with contaminant.  - SLP evaluation performed, continue dysphagia diet  Acute hypoxemic respiratory failure due to covid-19 pneumonia: SARS-CoV-2 testing positive on 1/10.  - Completed remdesivir - Continue steroids, CRP responding from 36.1 >> 1.5. Remains hypoxic, will attempt to wean, but if remains stable may discharge with supplemental oxygen back to facility. Will wean to decadron beginning tomorrow - Held baricitinib due to concomitant  - Encourage OOB, IS, FV, and awake proning if able -  Continue airborne, contact precautions for 21 days from positive testing. - Monitor CMP and inflammatory markers - Enoxaparin, increase to 40mg  q12h with persistent d-dimer elevation. D-dimer responding downward.  Chronic cholecystitis: HIDA with delayed GB filling, not consistent with acute cholecystitis.  - LFTs improving overall, WBC improved. Will continue abx x1 week per surgery recommendations.  - If worsening symptoms/LFTs, would warrant IR consult for cholecystostomy drain.   Acute metabolic encephalopathy: Resolved, now at suspected baseline.  - Palliative care following  Hypernatremia: Resolved  - Encourage po free water  HTN:  - Continue norvasc.   T2DM with hypoglycemia, now steroid-induced hyperglycemia: Brittle CBGs.  - DC linagliptin and basal insulin with fasting hypoglycemia. Continue mealtime and resistant SSI due to elevated postprandials and continued steroid administration.   History of CVA:  - Continue ASA  DVT prophylaxis: Lovenox Code Status: Partial Family Communication: None at bedside Disposition Plan:  Status is: Inpatient  Remains inpatient appropriate because:IV treatments appropriate due to intensity of illness or inability to take PO  Dispo:  Patient From: Skilled Nursing Facility  Planned Disposition: Skilled Nursing Facility  Expected discharge date: 10/23/2020  Medically stable for discharge: No  Consultants:   SLP  Palliative care  Procedures:   None  Antimicrobials:  Ceftriaxone, azithromycin > zosyn  Remdesivir   Subjective: No chest pain or other pain, taking po when fed. No dyspnea or other complaints. Had no symptoms with hypoglycemia this morning.  Objective: Vitals:   10/21/20 1959 10/21/20 2037 10/22/20 0628 10/22/20 1256  BP: (!) 159/93  (!) 150/90 (!) 157/74  Pulse: 99  100 98  Resp: 20  16 18   Temp: 98.3 F (36.8 C)  98.1 F (36.7 C) 98.2 F (36.8 C)  TempSrc:      SpO2: 98%  96% 96% 99%  Weight:       Height:        Intake/Output Summary (Last 24 hours) at 10/22/2020 1438 Last data filed at 10/22/2020 1000 Gross per 24 hour  Intake 851.9 ml  Output 525 ml  Net 326.9 ml   Filed Weights   10/16/20 1944  Weight: 70.3 kg   Gen: 75 y.o. female in no distress Pulm: Nonlabored breathing 2L O2. CV: Regular rate and rhythm. No murmur, rub, or gallop. No JVD, no dependent edema. GI: Abdomen soft, non-tender, non-distended, with normoactive bowel sounds.  Ext: Warm, no deformities Skin: No rashes, lesions or ulcers on visualized skin. Neuro: Alert, cognitive deficit, sparse speech. No new focal neurological deficits. Psych: Calm  Data Reviewed: I have personally reviewed following labs and imaging studies  CBC: Recent Labs  Lab 10/18/20 0357 10/19/20 0353 10/20/20 0407 10/21/20 0423 10/22/20 0441  WBC 7.0 7.5 11.9* 11.6* 12.2*  NEUTROABS 5.7 6.0 9.4* 9.4* 10.1*  HGB 9.4* 9.6* 10.5* 10.0* 10.0*  HCT 30.4* 31.7* 34.0* 31.6* 32.3*  MCV 88.4 88.1 87.9 85.4 87.5  PLT 244 270 265 275 271   Basic Metabolic Panel: Recent Labs  Lab 10/18/20 0357 10/19/20 0353 10/20/20 0400 10/20/20 0407 10/21/20 0423 10/22/20 0441  NA 147* 148*  --  146* 145 143  K 3.1* 3.3*  --  3.1* 4.2 4.2  CL 107 110  --  110 106 106  CO2 25 24  --  25 27 28   GLUCOSE 303* 447*  --  167* 98 82  BUN 48* 42*  --  37* 33* 31*  CREATININE 0.76 0.70  --  0.57 0.44 0.53  CALCIUM 8.5* 8.9  --  9.0 9.1 9.1  MG  --   --  1.9  --   --   --    GFR: Estimated Creatinine Clearance: 58.8 mL/min (by C-G formula based on SCr of 0.53 mg/dL). Liver Function Tests: Recent Labs  Lab 10/18/20 0357 10/19/20 0353 10/20/20 0407 10/21/20 0423 10/22/20 0441  AST 31 29 67* 41 74*  ALT 38 41 57* 65* 100*  ALKPHOS 117 157* 178* 186* 167*  BILITOT 0.5 0.3 0.5 0.3 0.4  PROT 5.3* 5.6* 5.5* 5.3* 4.7*  ALBUMIN 2.3* 2.4* 2.5* 2.3* 2.2*   No results for input(s): LIPASE, AMYLASE in the last 168 hours. No results for  input(s): AMMONIA in the last 168 hours. Coagulation Profile: No results for input(s): INR, PROTIME in the last 168 hours. Cardiac Enzymes: No results for input(s): CKTOTAL, CKMB, CKMBINDEX, TROPONINI in the last 168 hours. BNP (last 3 results) No results for input(s): PROBNP in the last 8760 hours. HbA1C: No results for input(s): HGBA1C in the last 72 hours. CBG: Recent Labs  Lab 10/21/20 1637 10/21/20 1956 10/22/20 0734 10/22/20 0818 10/22/20 1128  GLUCAP 257* 211* 46* 97 222*   Lipid Profile: No results for input(s): CHOL, HDL, LDLCALC, TRIG, CHOLHDL, LDLDIRECT in the last 72 hours. Thyroid Function Tests: No results for input(s): TSH, T4TOTAL, FREET4, T3FREE, THYROIDAB in the last 72 hours. Anemia Panel: Recent Labs    10/20/20 0407 10/21/20 0423  FERRITIN 190 124   Urine analysis:    Component Value Date/Time   COLORURINE AMBER (A) 10/16/2020 1526   APPEARANCEUR CLOUDY (A) 10/16/2020 1526   LABSPEC 1.021 10/16/2020 1526   PHURINE 5.0 10/16/2020 1526   GLUCOSEU NEGATIVE 10/16/2020 1526   HGBUR NEGATIVE 10/16/2020 1526   BILIRUBINUR NEGATIVE 10/16/2020 1526   KETONESUR NEGATIVE 10/16/2020  1526   PROTEINUR 30 (A) 10/16/2020 1526   UROBILINOGEN 0.2 11/19/2009 1055   NITRITE NEGATIVE 10/16/2020 1526   LEUKOCYTESUR NEGATIVE 10/16/2020 1526   Recent Results (from the past 240 hour(s))  Blood Culture (routine x 2)     Status: None   Collection Time: 10/16/20  3:25 PM   Specimen: BLOOD LEFT FOREARM  Result Value Ref Range Status   Specimen Description   Final    BLOOD LEFT FOREARM Performed at [facility] [address] [place], [place] [address]    Special Requests   Final    BOTTLES DRAWN AEROBIC AND ANAEROBIC Blood Culture adequate volume Performed at [facility], [address] [place], [place] [address]    Culture   Final    NO GROWTH 5 DAYS Performed at [facility], [address] [place], [place]  [address]    Report Status 10/21/2020 FINAL  Final  Blood Culture (routine x 2)     Status: Abnormal   Collection Time: 10/16/20  3:30 PM   Specimen: BLOOD  Result Value Ref Range Status   Specimen Description BLOOD LEFT ANTECUBITAL  Final   Special Requests   Final    BOTTLES DRAWN AEROBIC AND ANAEROBIC Blood Culture adequate volume   Culture  Setup Time   Final    GRAM POSITIVE COCCI IN CLUSTERS ANAEROBIC BOTTLE ONLY CRITICAL RESULT CALLED TO, READ BACK BY AND VERIFIED WITH: PHARMD N GLOGOVAC AT 1510 10/17/2020 BY L BENFIELD    Culture (A)  Final    STAPHYLOCOCCUS HOMINIS THE SIGNIFICANCE OF ISOLATING THIS ORGANISM FROM A SINGLE SET OF BLOOD CULTURES WHEN MULTIPLE SETS ARE DRAWN IS UNCERTAIN. PLEASE NOTIFY THE MICROBIOLOGY DEPARTMENT WITHIN ONE WEEK IF SPECIATION AND SENSITIVITIES ARE REQUIRED.    Report Status 10/19/2020 FINAL  Final  Blood Culture ID Panel (Reflexed)     Status: Abnormal   Collection Time: 10/16/20  3:30 PM  Result Value Ref Range Status   Enterococcus faecalis NOT DETECTED NOT DETECTED Final   Enterococcus Faecium NOT DETECTED NOT DETECTED Final   Listeria monocytogenes NOT DETECTED NOT DETECTED Final   Staphylococcus species DETECTED (A) NOT DETECTED Final    Comment: CRITICAL RESULT CALLED TO, READ BACK BY AND VERIFIED WITH: PHARMD N GLOGOVAC AT 1510 10/17/2020 BY L BENFIELD    Staphylococcus aureus (BCID) NOT DETECTED NOT DETECTED Final   Staphylococcus epidermidis NOT DETECTED NOT DETECTED Final   Staphylococcus lugdunensis NOT DETECTED NOT DETECTED Final   Streptococcus species NOT DETECTED NOT DETECTED Final   Streptococcus agalactiae NOT DETECTED NOT DETECTED Final   Streptococcus pneumoniae NOT DETECTED NOT DETECTED Final   Streptococcus pyogenes NOT DETECTED NOT DETECTED Final   A.calcoaceticus-baumannii NOT DETECTED NOT DETECTED Final   Bacteroides fragilis NOT DETECTED NOT DETECTED Final   Enterobacterales NOT DETECTED NOT DETECTED Final    Enterobacter cloacae complex NOT DETECTED NOT DETECTED Final   Escherichia coli NOT DETECTED NOT DETECTED Final   Klebsiella aerogenes NOT DETECTED NOT DETECTED Final   Klebsiella oxytoca NOT DETECTED NOT DETECTED Final   Klebsiella pneumoniae NOT DETECTED NOT DETECTED Final   Proteus species NOT DETECTED NOT DETECTED Final   Salmonella species NOT DETECTED NOT DETECTED Final   Serratia marcescens NOT DETECTED NOT DETECTED Final   Haemophilus influenzae NOT DETECTED NOT DETECTED Final   Neisseria meningitidis NOT DETECTED NOT DETECTED Final   Pseudomonas aeruginosa NOT DETECTED NOT DETECTED Final   Stenotrophomonas maltophilia NOT DETECTED NOT DETECTED Final  Candida albicans NOT DETECTED NOT DETECTED Final   Candida auris NOT DETECTED NOT DETECTED Final   Candida glabrata NOT DETECTED NOT DETECTED Final   Candida krusei NOT DETECTED NOT DETECTED Final   Candida parapsilosis NOT DETECTED NOT DETECTED Final   Candida tropicalis NOT DETECTED NOT DETECTED Final   Cryptococcus neoformans/gattii NOT DETECTED NOT DETECTED Final    Comment: Performed at [facility], [address] [place], [place] [address]  MRSA PCR Screening     Status: Abnormal   Collection Time: 10/16/20  9:08 PM   Specimen: Nasopharyngeal  Result Value Ref Range Status   MRSA by PCR POSITIVE (A) NEGATIVE Final    Comment:        The GeneXpert MRSA Assay (FDA approved for NASAL specimens only), is one component of a comprehensive MRSA colonization surveillance program. It is not intended to diagnose MRSA infection nor to guide or monitor treatment for MRSA infections. CRITICAL RESULT CALLED TO, READ BACK BY AND VERIFIED WITH: PAM HAMILTON RN 10/16/2020 @2249  BY P.HENDERSON Performed at [facility], [address], [place], [place]       Radiology Studies: NM Hepatobiliary Liver Func  Result Date: 10/20/2020 CLINICAL DATA:  Abdominal pain. EXAM: NUCLEAR MEDICINE  HEPATOBILIARY IMAGING TECHNIQUE: Sequential images of the abdomen were obtained out to 60 minutes following intravenous administration of radiopharmaceutical. RADIOPHARMACEUTICALS:  5.5 mCi Tc-86m  Choletec IV COMPARISON:  Right upper quadrant sonogram 10/20/2020 and CTA chest 10/17/2020 FINDINGS: Prompt uptake and biliary excretion of activity by the liver is seen. During the first hour biliary activity passes into small bowel, but no gallbladder activity was visualized. The patient then received 3 mg of morphine IV bolus and subsequent imaging was performed for an additional 30 minutes. Gallbladder activity appears following the administration of morphine. IMPRESSION: 1. Delayed filling of the gallbladder consistent with chronic calculus cholecystitis. 2. Patent common bile duct. Electronically Signed   By: 10/19/2020 M.D.   On: 10/20/2020 16:40    Scheduled Meds: . amLODipine  5 mg Oral Daily  . aspirin  325 mg Oral Daily  . DULoxetine  60 mg Oral Daily  . enoxaparin (LOVENOX) injection  40 mg Subcutaneous Q12H  . feeding supplement  237 mL Oral BID BM  . insulin aspart  0-20 Units Subcutaneous TID WC  . insulin aspart  6 Units Subcutaneous TID WC  . multivitamin with minerals  1 tablet Oral Daily  . predniSONE  50 mg Oral Daily  . sodium chloride flush  3 mL Intravenous Q12H   Continuous Infusions: . sodium chloride    . piperacillin-tazobactam (ZOSYN)  IV 3.375 g (10/22/20 1217)     LOS: 6 days   Time spent: 25 minutes.  10/24/20, MD [facility] [email or web address] 10/22/2020, 2:38 PM

## 2020-10-23 LAB — GLUCOSE, CAPILLARY
Glucose-Capillary: 231 mg/dL — ABNORMAL HIGH (ref 70–99)
Glucose-Capillary: 245 mg/dL — ABNORMAL HIGH (ref 70–99)
Glucose-Capillary: 311 mg/dL — ABNORMAL HIGH (ref 70–99)
Glucose-Capillary: 372 mg/dL — ABNORMAL HIGH (ref 70–99)

## 2020-10-23 MED ORDER — AMOXICILLIN-POT CLAVULANATE 875-125 MG PO TABS
1.0000 | ORAL_TABLET | Freq: Two times a day (BID) | ORAL | Status: DC
  Administered 2020-10-23: 1 via ORAL
  Filled 2020-10-23: qty 1

## 2020-10-23 MED ORDER — AMOXICILLIN-POT CLAVULANATE 875-125 MG PO TABS: 1.0000 | ORAL_TABLET | Freq: Two times a day (BID) | ORAL | 0 refills | Status: AC

## 2020-10-23 NOTE — TOC Transition Note (Addendum)
Transition of Care [facility]) - CM/SW Discharge Note   Patient Details  Name: Janice Little MRN: [id number] Date of Birth: Nov 09, 1945  Transition of Care [facility]) CM/SW Contact:  Darleene Cleaver, LCSW Phone Number: 10/23/2020, 11:43 AM   Clinical Narrative:     Patient to be d/c'ed today to [facility] room 706A around 2pm per SNF.  Patient and family agreeable to plans will transport via ems RN to call report to [phone].  CSW notified patient's POA which is her sister that she is discharging today.  Palliative to follow patient at SNF, CSW made referral to [facility] who will accept patient and follow at SNF.  Final next level of care: Skilled Nursing Facility Barriers to Discharge: Barriers Resolved   Patient Goals and CMS Choice Patient states their goals for this hospitalization and ongoing recovery are:: To return back to SNF. CMS Medicare.gov Compare Post Acute Care list provided to:: Patient Represenative (must comment) Choice offered to / list presented to : [facility] POA / Guardian  Discharge Placement   Existing PASRR number confirmed : 10/19/20          Patient chooses bed at: [facility] Patient to be transferred to facility by: [facility] Name of family member notified: [name] [name] [phone] Patient and family notified of of transfer: 10/23/20  Discharge Plan and Services  Patient to go to SNF at [facility] which she is a long term care resident.                                   Social Determinants of Health (SDOH) Interventions     Readmission Risk Interventions Readmission Risk Prevention Plan 09/18/2019 12/24/2018  Post Dischage Appt - Complete  Medication Screening - Complete  Transportation Screening Complete Complete  PCP or Specialist Appt within 5-7 Days Complete -  Home Care Screening Complete -  Medication Review (RN CM) Complete -  Some recent data might be hidden

## 2020-10-23 NOTE — Care Management Important Message (Signed)
Important Message  Patient Details IM Letter given to the Patient. Name: Janice Little MRN: [id number] Date of Birth: 1946/07/01   Medicare Important Message Given:  Yes     Caren Macadam 10/23/2020, 11:00 AM

## 2020-10-23 NOTE — Progress Notes (Signed)
Pt discharging to [facility]. AVS completed and placed in packet for transfer. Report called to [facility] RN. Awaiting arrival of [facility]. Patient in NAD at this time.

## 2020-10-23 NOTE — Progress Notes (Signed)
[facility]  Is here now to take pt to NH.  They have packet in hand.

## 2020-10-23 NOTE — Progress Notes (Signed)
  Speech Language Pathology Treatment: Dysphagia  Patient Details Name: Janice Little MRN: [id number] DOB: 09/29/1946 Today's Date: 10/23/2020 Time: 4332-9518 SLP Time Calculation (min) (ACUTE ONLY): 16 min  Assessment / Plan / Recommendation Clinical Impression  Pt tolerating current diet, but RN reports some difficulty with oral transit of capsule.  May crush meds if permissible to ease in medication administration.  Pt tolerated all consistencies trialed today without any clinical s/s of aspiration.  Pt exhibited prolonged oral phase with all consistencies, including puree, but oral phase length increased as solids advance.  Although pt eventually achieved adequate oral clearance of simulated ground consistency, soft solid, and regular solid textures, there was increased work with these consistencies.  Puree texture was most efficiently cleared.  Pt was awake, but minimally responsive to SLP.  Pt may be more appropriate for diet advancement as level of arousal continues to improve.  Recommend continuing puree solid with thin liquids at present.    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  Patient was diagnosed with COVID-19 4 days ago and has been having increased weakness and confusion.  Patient is bedbound at baseline.  Dx sepsis, acute hypoxic resp failure secondary to COVID 19, suspected asp pna.      SLP Plan  Continue with current plan of care       Recommendations  Diet recommendations: Dysphagia 1 (puree);Thin liquid Liquids provided via: Cup;Straw Medication Administration: Whole meds with puree Supervision: Full supervision/cueing for compensatory strategies;Staff to  assist with self feeding Compensations: Minimize environmental distractions;Slow rate;Small sips/bites Postural Changes and/or Swallow Maneuvers: Seated upright 90 degrees                Oral Care Recommendations: Oral care BID Follow up Recommendations: Other (comment);24 hour supervision/assistance SLP Visit Diagnosis: Dysphagia, unspecified (R13.10) Plan: Continue with current plan of care       GO                Kerrie Pleasure, MA, CCC-SLP Acute Rehabilitation Services Office: [phone] 10/23/2020, 11:50 AM

## 2020-10-23 NOTE — Discharge Summary (Signed)
Physician Discharge Summary  Janice Little:[id number] DOB: 02-Feb-1946 DOA: 10/16/2020  PCP: Juluis Rainier, MD  Admit date: 10/16/2020 Discharge date: 10/23/2020  Admitted From: SNF Disposition: SNF   Recommendations for Outpatient Follow-up:  1. Follow up with PCP in 1-2 weeks 2. Continue palliative follow up at facility. 3. Repeat CMP to monitor LFTs.   Home Health: N/A Equipment/Devices: None new Discharge Condition: Stable CODE STATUS: Partial, DNI Diet recommendation: Dysphagia 1, thin liquids, carb-modified, needs assistance with feedings  Brief/Interim Summary: Janice Little is a 75 y.o. 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 Found to be afebrile, tachycardic, tachypneic, hypoxic requiring 5L O2. CXR revealed patchy bilateral airspace opacities. Remdesivir and decadron were given on admission. LFTs trended upward, RUQ U/S suggestive of cholecystitis with subsequent HIDA scan showing filling, but delayed, of the gallbladder more consistent with chronic cholecystitis. LFTs are stabilizing and surgery has recommended a week of antibiotics, conservative management. She has no abdominal pain or tenderness. Palliative discussions are ongoing, though she has improved sufficiently to return to facility having weaned from supplemental oxygen.  Discharge Diagnoses:  Principal Problem:   Acute hypoxemic respiratory failure due to COVID-19 [facility]) Active Problems:   Type 2 diabetes mellitus with hypoglycemia without coma (HCC)   Essential hypertension   Elevated C-reactive protein (CRP)   Acute sepsis (HCC)   Acute metabolic encephalopathy   Acute cholecystitis  Sepsis due to aspiration pneumonia: POA, resolved.  - Abx changed  to zosyn > augmentin. PCT is negative - Cultures with S. hominis in 1 of 4 bottles felt to be consistent with contaminant.  - SLP evaluation performed, continue dysphagia diet  Acute hypoxemic respiratory failure due to covid-19 pneumonia: SARS-CoV-2 testing positive on 1/10.  - Completed remdesivir - Will stop steroids due to brittle diabetic control and adequate response clinically and by inflammatory markers.  - Continue airborne, contact precautions for 21 days from positive testing.  Chronic cholecystitis: HIDA with delayed GB filling, not consistent with acute cholecystitis.  - LFTs improving overall, WBC improved. Will continue abx x1 week per surgery recommendations.  - If worsening symptoms/LFTs, would warrant IR consult for cholecystostomy drain.   Acute metabolic encephalopathy: Resolved, now at suspected baseline.  - Palliative care should continue following at facility  Hypernatremia: Resolved  - Encourage po free water  HTN:  - Continue home medications  T2DM with hypoglycemia, now steroid-induced hyperglycemia: Brittle CBGs.  - Based on normal HbA1c on last check, we will not continue steroids and resume PTA medications including metformin, insulins.  History of CVA, MS, bedbound:  - Continue ASA - Repositioning - Gabapentin  Discharge Instructions Discharge Instructions    MyChart COVID-19 home monitoring program   Complete by: Oct 23, 2020    Is the patient willing to use the MyChart Mobile App for home monitoring?: Yes     Allergies as of 10/23/2020   No Known Allergies     Medication List    STOP taking these medications   predniSONE 10 MG tablet Commonly known as: DELTASONE     TAKE these medications   acetaminophen 325 MG tablet Commonly known as: TYLENOL Take 2 tablets (650 mg total) by mouth every 4 (four) hours as needed for mild pain (or temp > 37.5 C (99.5 F)).   amLODipine 5 MG tablet Commonly  known as: NORVASC Take 5 mg by mouth  daily.   amoxicillin-clavulanate 875-125 MG tablet Commonly known as: AUGMENTIN Take 1 tablet by mouth every 12 (twelve) hours.   aspirin 325 MG tablet Take 1 tablet (325 mg total) by mouth daily.   Cerovite Senior Tabs Take 1 tablet by mouth daily.   Dermacloud Oint Apply 1 application topically daily. Apply to buttocks.   DULoxetine 60 MG capsule Commonly known as: CYMBALTA Take 60 mg by mouth daily.   gabapentin 100 MG capsule Commonly known as: NEURONTIN Take 100 mg by mouth 3 (three) times daily.   hydrALAZINE 25 MG tablet Commonly known as: APRESOLINE Take 3 tablets (75 mg total) by mouth every 8 (eight) hours.   insulin glargine 100 UNIT/ML injection Commonly known as: LANTUS Inject 34 Units into the skin at bedtime.   insulin lispro 100 UNIT/ML injection Commonly known as: HUMALOG Inject 2-11 Units into the skin 3 (three) times daily before meals. Sliding scale: 101-150=2 U 151-200=3 U 201-250=5 U 251-300=7 U 301-350=9 U >350=11 U  Call MD for BS>400 or <60   lisinopril 40 MG tablet Commonly known as: ZESTRIL Take 40 mg by mouth daily. What changed: Another medication with the same name was removed. Continue taking this medication, and follow the directions you see here.   melatonin 3 MG Tabs tablet Take 3 mg by mouth at bedtime.   metFORMIN 500 MG 24 hr tablet Commonly known as: GLUCOPHAGE-XR Take 1 tablet (500 mg total) by mouth 2 (two) times a day.   omeprazole 20 MG tablet Commonly known as: PRILOSEC OTC Take 20 mg by mouth daily.   polyethylene glycol powder 17 GM/SCOOP powder Commonly known as: GLYCOLAX/MIRALAX Take 17 g by mouth daily as needed for mild constipation.   Vitamin D 50 MCG (2000 UT) Caps Take 2,000 Units by mouth daily.       Follow-up Information    Juluis Rainier, MD Follow up.   Specialty: Family Medicine Contact information: [address] [place] [address] [phone]              No Known  Allergies  Consultations:  None  Procedures/Studies: CT ANGIO CHEST PE W OR WO CONTRAST  Result Date: 10/17/2020 CLINICAL DATA:  Positive D-dimer. Clinical suspicion for pulmonary embolus. EXAM: CT ANGIOGRAPHY CHEST WITH CONTRAST TECHNIQUE: Multidetector CT imaging of the chest was performed using the standard protocol during bolus administration of intravenous contrast. Multiplanar CT image reconstructions and MIPs were obtained to evaluate the vascular anatomy. CONTRAST:  56mL OMNIPAQUE IOHEXOL 350 MG/ML SOLN COMPARISON:  Chest radiograph, 10/16/2020 and earlier exams. FINDINGS: Cardiovascular: Pulmonary arteries are well opacified. There is no evidence of a pulmonary embolism. Heart is normal in size and configuration. No pericardial effusion. No coronary artery calcifications. Great vessels are normal in caliber. Mild aortic atherosclerosis. No dissection. Mediastinum/Nodes: Unremarkable thyroid. No neck base or axillary masses or enlarged lymph nodes. There are subcentimeter mediastinal lymph nodes and hilar lymph nodes. No largest node is a right subcarinal node measuring 12 mm in short axis. No mediastinal or hilar masses. Trachea and esophagus are unremarkable. Lungs/Pleura: Trace pleural effusions. Bilateral patchy areas ground-glass and more confluent airspace opacity involving both lungs and all lobes. Additional opacity is noted in dependent aspects of the lower lobes consistent with atelectasis. No evidence of pulmonary edema. No pneumothorax. Upper Abdomen: No acute findings. Intermediate attenuation material noted in the gallbladder which may reflect stones or sludge. Musculoskeletal: No fracture or acute finding.  No bone lesion. Review of the MIP images confirms the above findings. IMPRESSION: 1. No evidence of a pulmonary embolism. 2. Bilateral airspace lung opacities consistent with multifocal pneumonia, compatible with COVID-19 infection. Additional dependent lower lobe atelectasis with  trace pleural effusions. Aortic Atherosclerosis (ICD10-I70.0). Electronically Signed   By: Amie Portland M.D.   On: 10/17/2020 11:42   NM Hepatobiliary Liver Func  Result Date: 10/20/2020 CLINICAL DATA:  Abdominal pain. EXAM: NUCLEAR MEDICINE HEPATOBILIARY IMAGING TECHNIQUE: Sequential images of the abdomen were obtained out to 60 minutes following intravenous administration of radiopharmaceutical. RADIOPHARMACEUTICALS:  5.5 mCi Tc-68m  Choletec IV COMPARISON:  Right upper quadrant sonogram 10/20/2020 and CTA chest 10/17/2020 FINDINGS: Prompt uptake and biliary excretion of activity by the liver is seen. During the first hour biliary activity passes into small bowel, but no gallbladder activity was visualized. The patient then received 3 mg of morphine IV bolus and subsequent imaging was performed for an additional 30 minutes. Gallbladder activity appears following the administration of morphine. IMPRESSION: 1. Delayed filling of the gallbladder consistent with chronic calculus cholecystitis. 2. Patent common bile duct. Electronically Signed   By: Signa Kell M.D.   On: 10/20/2020 16:40   DG Chest Port 1 View  Result Date: 10/16/2020 CLINICAL DATA:  Weakness, COVID-19 positive EXAM: PORTABLE CHEST 1 VIEW COMPARISON:  09/06/2019 FINDINGS: Heart size within normal limits. Loop recorder device. Patchy airspace opacities within the mid to lower lung fields bilaterally. No pleural effusion or pneumothorax. IMPRESSION: Patchy airspace opacities within the mid to lower lung fields bilaterally compatible with multifocal atypical/viral pneumonia. Electronically Signed   By: Duanne Guess D.O.   On: 10/16/2020 15:39   US Abdomen Limited RUQ (LIVER/GB)  Result Date: 10/20/2020 CLINICAL DATA:  Elevated LFTs EXAM: ULTRASOUND ABDOMEN LIMITED RIGHT UPPER QUADRANT COMPARISON:  CT of the chest dated October 17, 2020 FINDINGS: Gallbladder: Wall thickening is noted with "wall echo shadow" sign. Tenderness reported  over the gallbladder by the sonographer. The lumen of the gallbladder is filled with calculi such that the posterior wall of the gallbladder is not evaluated. Common bile duct: Diameter: Not visualized due to limitations from shadowing arising from the calculus filled gallbladder and patient body habitus. Liver: Limited assessment of the liver due to patient body habitus. No gross lesion on submitted images. Portal vein is patent on color Doppler imaging with normal direction of blood flow towards the liver. Other: None. IMPRESSION: Ultrasound findings of acute cholecystitis with reported tenderness over the gallbladder in the setting of cholelithiasis with numerous gallstones filling the lumen of the gallbladder. Common bile duct not evaluated due to abundant shadowing from stone filled gallbladder. Electronically Signed   By: Donzetta Kohut M.D.   On: 10/20/2020 09:00    Subjective: No overnight events. Taking good po. Denies abdominal pain, N/V/D. Weaning from oxygen without issue. Blood sugars low yesterday AM then high despite bolus insulins.  Discharge Exam: Vitals:   10/23/20 0532 10/23/20 0911  BP: (!) 176/88 (!) 173/81  Pulse: 90 82  Resp: 20 19  Temp: 98.4 F (36.9 C) 98.2 F (36.8 C)  SpO2: 100% 100%   General: No distress Cardiovascular: RRR, S1/S2 +, no rubs, no gallops Respiratory: Nonlabored and clear Abdominal: Soft without any tenderness with attention to RUQ, no masses, +BS. Neuro: Alert, follows commands, hemiparesis is stable with no new focal deficits.  Labs: BNP (last 3 results) No results for input(s): BNP in the last 8760 hours. Basic Metabolic Panel: Recent Labs  Lab 10/18/20  [id number] 10/19/20 0353 10/20/20 0400 10/20/20 0407 10/21/20 0423 10/22/20 0441  NA 147* 148*  --  146* 145 143  K 3.1* 3.3*  --  3.1* 4.2 4.2  CL 107 110  --  110 106 106  CO2 25 24  --  25 27 28   GLUCOSE 303* 447*  --  167* 98 82  BUN 48* 42*  --  37* 33* 31*  CREATININE 0.76 0.70   --  0.57 0.44 0.53  CALCIUM 8.5* 8.9  --  9.0 9.1 9.1  MG  --   --  1.9  --   --   --    Liver Function Tests: Recent Labs  Lab 10/18/20 0357 10/19/20 0353 10/20/20 0407 10/21/20 0423 10/22/20 0441  AST 31 29 67* 41 74*  ALT 38 41 57* 65* 100*  ALKPHOS 117 157* 178* 186* 167*  BILITOT 0.5 0.3 0.5 0.3 0.4  PROT 5.3* 5.6* 5.5* 5.3* 4.7*  ALBUMIN 2.3* 2.4* 2.5* 2.3* 2.2*   No results for input(s): LIPASE, AMYLASE in the last 168 hours. No results for input(s): AMMONIA in the last 168 hours. CBC: Recent Labs  Lab 10/18/20 0357 10/19/20 0353 10/20/20 0407 10/21/20 0423 10/22/20 0441  WBC 7.0 7.5 11.9* 11.6* 12.2*  NEUTROABS 5.7 6.0 9.4* 9.4* 10.1*  HGB 9.4* 9.6* 10.5* 10.0* 10.0*  HCT 30.4* 31.7* 34.0* 31.6* 32.3*  MCV 88.4 88.1 87.9 85.4 87.5  PLT 244 270 265 275 271   Cardiac Enzymes: No results for input(s): CKTOTAL, CKMB, CKMBINDEX, TROPONINI in the last 168 hours. BNP: Invalid input(s): POCBNP CBG: Recent Labs  Lab 10/22/20 1128 10/22/20 1626 10/22/20 2018 10/22/20 2248 10/23/20 0815  GLUCAP 222* 347* 381* 293* 231*   D-Dimer Recent Labs    10/21/20 0423 10/22/20 0441  DDIMER 3.67* 2.95*   Hgb A1c No results for input(s): HGBA1C in the last 72 hours. Lipid Profile No results for input(s): CHOL, HDL, LDLCALC, TRIG, CHOLHDL, LDLDIRECT in the last 72 hours. Thyroid function studies No results for input(s): TSH, T4TOTAL, T3FREE, THYROIDAB in the last 72 hours.  Invalid input(s): FREET3 Anemia work up Recent Labs    10/21/20 0423  FERRITIN 124   Urinalysis    Component Value Date/Time   COLORURINE AMBER (A) 10/16/2020 1526   APPEARANCEUR CLOUDY (A) 10/16/2020 1526   LABSPEC 1.021 10/16/2020 1526   PHURINE 5.0 10/16/2020 1526   GLUCOSEU NEGATIVE 10/16/2020 1526   HGBUR NEGATIVE 10/16/2020 1526   BILIRUBINUR NEGATIVE 10/16/2020 1526   KETONESUR NEGATIVE 10/16/2020 1526   PROTEINUR 30 (A) 10/16/2020 1526   UROBILINOGEN 0.2 11/19/2009 1055    NITRITE NEGATIVE 10/16/2020 1526   LEUKOCYTESUR NEGATIVE 10/16/2020 1526    Microbiology Recent Results (from the past 240 hour(s))  Blood Culture (routine x 2)     Status: None   Collection Time: 10/16/20  3:25 PM   Specimen: BLOOD LEFT FOREARM  Result Value Ref Range Status   Specimen Description   Final    BLOOD LEFT FOREARM Performed at [facility], [address] [place], [place] [address]    Special Requests   Final    BOTTLES DRAWN AEROBIC AND ANAEROBIC Blood Culture adequate volume Performed at [facility], [address] [place], [place] [address]    Culture   Final    NO GROWTH 5 DAYS Performed at [facility], [address] [place], [place] [address]    Report Status 10/21/2020 FINAL  Final  Blood Culture (  routine x 2)     Status: Abnormal   Collection Time: 10/16/20  3:30 PM   Specimen: BLOOD  Result Value Ref Range Status   Specimen Description BLOOD LEFT ANTECUBITAL  Final   Special Requests   Final    BOTTLES DRAWN AEROBIC AND ANAEROBIC Blood Culture adequate volume   Culture  Setup Time   Final    GRAM POSITIVE COCCI IN CLUSTERS ANAEROBIC BOTTLE ONLY CRITICAL RESULT CALLED TO, READ BACK BY AND VERIFIED WITH: PHARMD N GLOGOVAC AT 1510 10/17/2020 BY L BENFIELD    Culture (A)  Final    STAPHYLOCOCCUS HOMINIS THE SIGNIFICANCE OF ISOLATING THIS ORGANISM FROM A SINGLE SET OF BLOOD CULTURES WHEN MULTIPLE SETS ARE DRAWN IS UNCERTAIN. PLEASE NOTIFY THE MICROBIOLOGY DEPARTMENT WITHIN ONE WEEK IF SPECIATION AND SENSITIVITIES ARE REQUIRED.    Report Status 10/19/2020 FINAL  Final  Blood Culture ID Panel (Reflexed)     Status: Abnormal   Collection Time: 10/16/20  3:30 PM  Result Value Ref Range Status   Enterococcus faecalis NOT DETECTED NOT DETECTED Final   Enterococcus Faecium NOT DETECTED NOT DETECTED Final   Listeria monocytogenes NOT DETECTED NOT DETECTED Final   Staphylococcus species DETECTED (A) NOT DETECTED  Final    Comment: CRITICAL RESULT CALLED TO, READ BACK BY AND VERIFIED WITH: PHARMD N GLOGOVAC AT 1510 10/17/2020 BY L BENFIELD    Staphylococcus aureus (BCID) NOT DETECTED NOT DETECTED Final   Staphylococcus epidermidis NOT DETECTED NOT DETECTED Final   Staphylococcus lugdunensis NOT DETECTED NOT DETECTED Final   Streptococcus species NOT DETECTED NOT DETECTED Final   Streptococcus agalactiae NOT DETECTED NOT DETECTED Final   Streptococcus pneumoniae NOT DETECTED NOT DETECTED Final   Streptococcus pyogenes NOT DETECTED NOT DETECTED Final   A.calcoaceticus-baumannii NOT DETECTED NOT DETECTED Final   Bacteroides fragilis NOT DETECTED NOT DETECTED Final   Enterobacterales NOT DETECTED NOT DETECTED Final   Enterobacter cloacae complex NOT DETECTED NOT DETECTED Final   Escherichia coli NOT DETECTED NOT DETECTED Final   Klebsiella aerogenes NOT DETECTED NOT DETECTED Final   Klebsiella oxytoca NOT DETECTED NOT DETECTED Final   Klebsiella pneumoniae NOT DETECTED NOT DETECTED Final   Proteus species NOT DETECTED NOT DETECTED Final   Salmonella species NOT DETECTED NOT DETECTED Final   Serratia marcescens NOT DETECTED NOT DETECTED Final   Haemophilus influenzae NOT DETECTED NOT DETECTED Final   Neisseria meningitidis NOT DETECTED NOT DETECTED Final   Pseudomonas aeruginosa NOT DETECTED NOT DETECTED Final   Stenotrophomonas maltophilia NOT DETECTED NOT DETECTED Final   Candida albicans NOT DETECTED NOT DETECTED Final   Candida auris NOT DETECTED NOT DETECTED Final   Candida glabrata NOT DETECTED NOT DETECTED Final   Candida krusei NOT DETECTED NOT DETECTED Final   Candida parapsilosis NOT DETECTED NOT DETECTED Final   Candida tropicalis NOT DETECTED NOT DETECTED Final   Cryptococcus neoformans/gattii NOT DETECTED NOT DETECTED Final    Comment: Performed at [facility], [address], [place], [place] [address]  MRSA PCR Screening     Status: Abnormal   Collection Time: 10/16/20  9:08  PM   Specimen: Nasopharyngeal  Result Value Ref Range Status   MRSA by PCR POSITIVE (A) NEGATIVE Final    Comment:        The GeneXpert MRSA Assay (FDA approved for NASAL specimens only), is one component of a comprehensive MRSA colonization surveillance program. It is not intended to diagnose MRSA infection nor to guide or monitor treatment for MRSA infections.  CRITICAL RESULT CALLED TO, READ BACK BY AND VERIFIED WITH: PAM HAMILTON RN 10/16/2020 @2249  BY P.HENDERSON Performed at [facility], [address] [place], [place]     Time coordinating discharge: Approximately 40 minutes  [id number], MD  [facility] 10/23/2020, 11:14 AM

## 2020-10-23 NOTE — Plan of Care (Signed)
  Problem: Health Behavior/Discharge Planning: Goal: Ability to manage health-related needs will improve Outcome: Progressing   Problem: Clinical Measurements: Goal: Will remain free from infection Outcome: Progressing Goal: Diagnostic test results will improve Outcome: Progressing Goal: Respiratory complications will improve Outcome: Progressing   

## 2020-11-05 ENCOUNTER — Non-Acute Institutional Stay: Payer: Medicare Other | Admitting: Nurse Practitioner

## 2020-11-05 ENCOUNTER — Other Ambulatory Visit: Payer: Self-pay

## 2020-11-05 DIAGNOSIS — Z515 Encounter for palliative care: Secondary | ICD-10-CM

## 2020-11-05 DIAGNOSIS — R531 Weakness: Secondary | ICD-10-CM

## 2020-11-05 NOTE — Progress Notes (Addendum)
[facility] Consult Note Telephone: [phone]  Fax: [phone]  PATIENT NAME: Janice Little Janice Little [id number] [phone] (home)  DOB: 1945/12/05 MRN: [id number]  PRIMARY CARE PROVIDER:    Leighton Ruff, MD,  [place] [address] [phone]  REFERRING PROVIDER:   Leighton Ruff, [address],  [place] [address] [phone]  RESPONSIBLE PARTY:   Extended Emergency Contact Information Primary Emergency Contact: Brown,Chris Address: [address]          [place] [address] [place] of [place] Phone: [phone] Relation: Son Secondary Emergency Contact: Arvil Chaco Mobile Phone: [phone] Relation: Sister  I met face to face with patient in facility.  ASSESSMENT AND RECOMMENDATIONS:   Advance Care Planning: Patient unable to substantially participate in discussion due to poor cognition. Pending ACP discussion with her sister. ACP discussion scheduled for 11/09/2020. Addendum 11/16/2020 Made 2 attempts (11/13/2020 and 11/16/2020) to reach [facility] as requested, to review patient's goal of care and have advance care discussion. Unable to leave message on number provided (voice mail not set up). Called son Martyn Ehrich, unable to reach left voice message to call me, for update on Palliative care visit. Cognitive / Functional decline/ Symptom Management:  Patient awake and alert, she has limited speech. She is totally dependent on staff for all of her ADLs including feeding. Patent is bed bound. Weakness: Staff report patient with progressive decline in cognition and function since hospitalization for Covid-19 infection and acute cholecystitis. No report of uncontrolled pain, no report of nausea or vomiting. Continue current plan of care. Maintain safety, fall precaution per facility protocol. Maintain skin integrity, assist with routine turns.  Continue to assist during meals. Diabetes: No report of Hypoglycemia. Continue current regimen of Metformin ER $RemoveBefo'500mg'[id number]$  twice daily, Lantus 34 units at bed time and sliding scale insulin. Palliative care will continue to provide support to patient, family and the medical team.    Follow up Palliative Care Visit: Palliative care will continue to follow for goals of care clarification and symptom management. Return in about 4-6 weeks or prn.  Family /Caregiver/Community Supports: Patient is a resident at [facility].   I spent 30 minutes providing this consultation. More than 50% of the time in this consultation was spent counseling and coordinating communication.   CHIEF COMPLAINT: Weakness  History obtained from review of EMR and discussion with facility staff. Records reviewed and summarized bellow.  HISTORY OF PRESENT ILLNESS:  Janice Little is a 75 y.o. year old female with multiple medical problems including type 2 DM, CVA with residual cognitive deficit, MS, HTN, HLD, pry hyperthyroidism s/p parathyroidectomy Patient was recently hospitalized from 10/17/2019 to 10/23/2020 for COVID-19 infection and acute cholecystitis. Patient reported to have progressive decline in function and cognition since hospital discharge. Palliative Care was asked to help address advance care planning and goals of care. This is an initial visit.  CODE STATUS: Full Code  PPS: 30%  HOSPICE ELIGIBILITY/DIAGNOSIS: TBD  PHYSICAL EXAM / ROS:   Current and past weights: 149.80lbs General: NAD,chronically ill and frail appearing,  Cardiovascular: no chest pain reported Pulmonary: no cough, no increased SOB, on supplemental oxygen at 2L GI: appetite fair, no repot of constipation, incontinent of bowel GU: no report of dysuria, incontinent of urine MSK:  Non-ambulatory Skin: ecchymotic areas with blisters noted to left fore arm Neurological: Weakness, confused Psych: non-anxious  affect  PAST MEDICAL HISTORY:  Past Medical History:  Diagnosis Date  . Chronic kidney disease (CKD), stage II (mild)   . Chronic pain   . DM type 2 (diabetes mellitus, type 2) ([place])   . Fatty liver    per CT  . GERD (gastroesophageal reflux disease)   . History of hepatitis as a child    age 27 (jaundice)  . HTN (hypertension)   . Hyperlipidemia   . MS (multiple sclerosis) ([place])   . Obesity   . Osteopenia   . Primary hyperparathyroidism (HCC)    s/p surgery removal of parathyroid (Dr. Buddy Duty, Dr. Harlow Asa)  . Vitamin D deficiency     SOCIAL HX:  Social History   Tobacco Use  . Smoking status: Never Smoker  . Smokeless tobacco: Never Used  Substance Use Topics  . Alcohol use: No   FAMILY HX:  Family History  Problem Relation Age of Onset  . Mental illness Mother   . COPD Father   . Lung cancer Father     ALLERGIES: No Known Allergies   PERTINENT MEDICATIONS:  Outpatient Encounter Medications as of 11/05/2020  Medication Sig  . acetaminophen (TYLENOL) 325 MG tablet Take 2 tablets (650 mg total) by mouth every 4 (four) hours as needed for mild pain (or temp > 37.5 C (99.5 F)).  Marland Kitchen amLODipine (NORVASC) 5 MG tablet Take 5 mg by mouth daily.  Marland Kitchen amoxicillin-clavulanate (AUGMENTIN) 875-125 MG tablet Take 1 tablet by mouth every 12 (twelve) hours.  Marland Kitchen aspirin 325 MG tablet Take 1 tablet (325 mg total) by mouth daily.  . Cholecalciferol (VITAMIN D) 50 MCG (2000 UT) CAPS Take 2,000 Units by mouth daily.  . DULoxetine (CYMBALTA) 60 MG capsule Take 60 mg by mouth daily.  Marland Kitchen gabapentin (NEURONTIN) 100 MG capsule Take 100 mg by mouth 3 (three) times daily.  . hydrALAZINE (APRESOLINE) 25 MG tablet Take 3 tablets (75 mg total) by mouth every 8 (eight) hours.  . Infant Care Products (DERMACLOUD) OINT Apply 1 application topically daily. Apply to buttocks.  . insulin glargine (LANTUS) 100 UNIT/ML injection Inject 34 Units into the skin at bedtime.  . insulin lispro (HUMALOG) 100 UNIT/ML  injection Inject 2-11 Units into the skin 3 (three) times daily before meals. Sliding scale: 101-150=2 U 151-200=3 U 201-250=5 U 251-300=7 U 301-350=9 U >350=11 U  Call MD for BS>400 or <60  . lisinopril (ZESTRIL) 40 MG tablet Take 40 mg by mouth daily.  . Melatonin 3 MG TABS Take 3 mg by mouth at bedtime.   . metFORMIN (GLUCOPHAGE-XR) 500 MG 24 hr tablet Take 1 tablet (500 mg total) by mouth 2 (two) times a day.  . Multiple Vitamins-Minerals (CEROVITE SENIOR) TABS Take 1 tablet by mouth daily.  Marland Kitchen omeprazole (PRILOSEC OTC) 20 MG tablet Take 20 mg by mouth daily.  . polyethylene glycol powder (GLYCOLAX/MIRALAX) 17 GM/SCOOP powder Take 17 g by mouth daily as needed for mild constipation.   No facility-administered encounter medications on file as of 11/05/2020.    Thank you for the opportunity to participate in the care of Ms. Valentino Saxon. The palliative care team will continue to follow. Please call our office at [phone] if we can be of additional assistance.  Jari Favre, DNP, AGPCNP-BC

## 2020-11-13 ENCOUNTER — Telehealth: Payer: Self-pay | Admitting: Nurse Practitioner

## 2020-12-12 IMAGING — CR PELVIS - 1-2 VIEW
1 series · 1 of 1 positions shown · non-contrast
Comparison: None

CLINICAL DATA: Recent fall. Unable to inverted left foot. Question
stroke.

EXAM:
PELVIS - 1-2 VIEW

[pelvis ap]
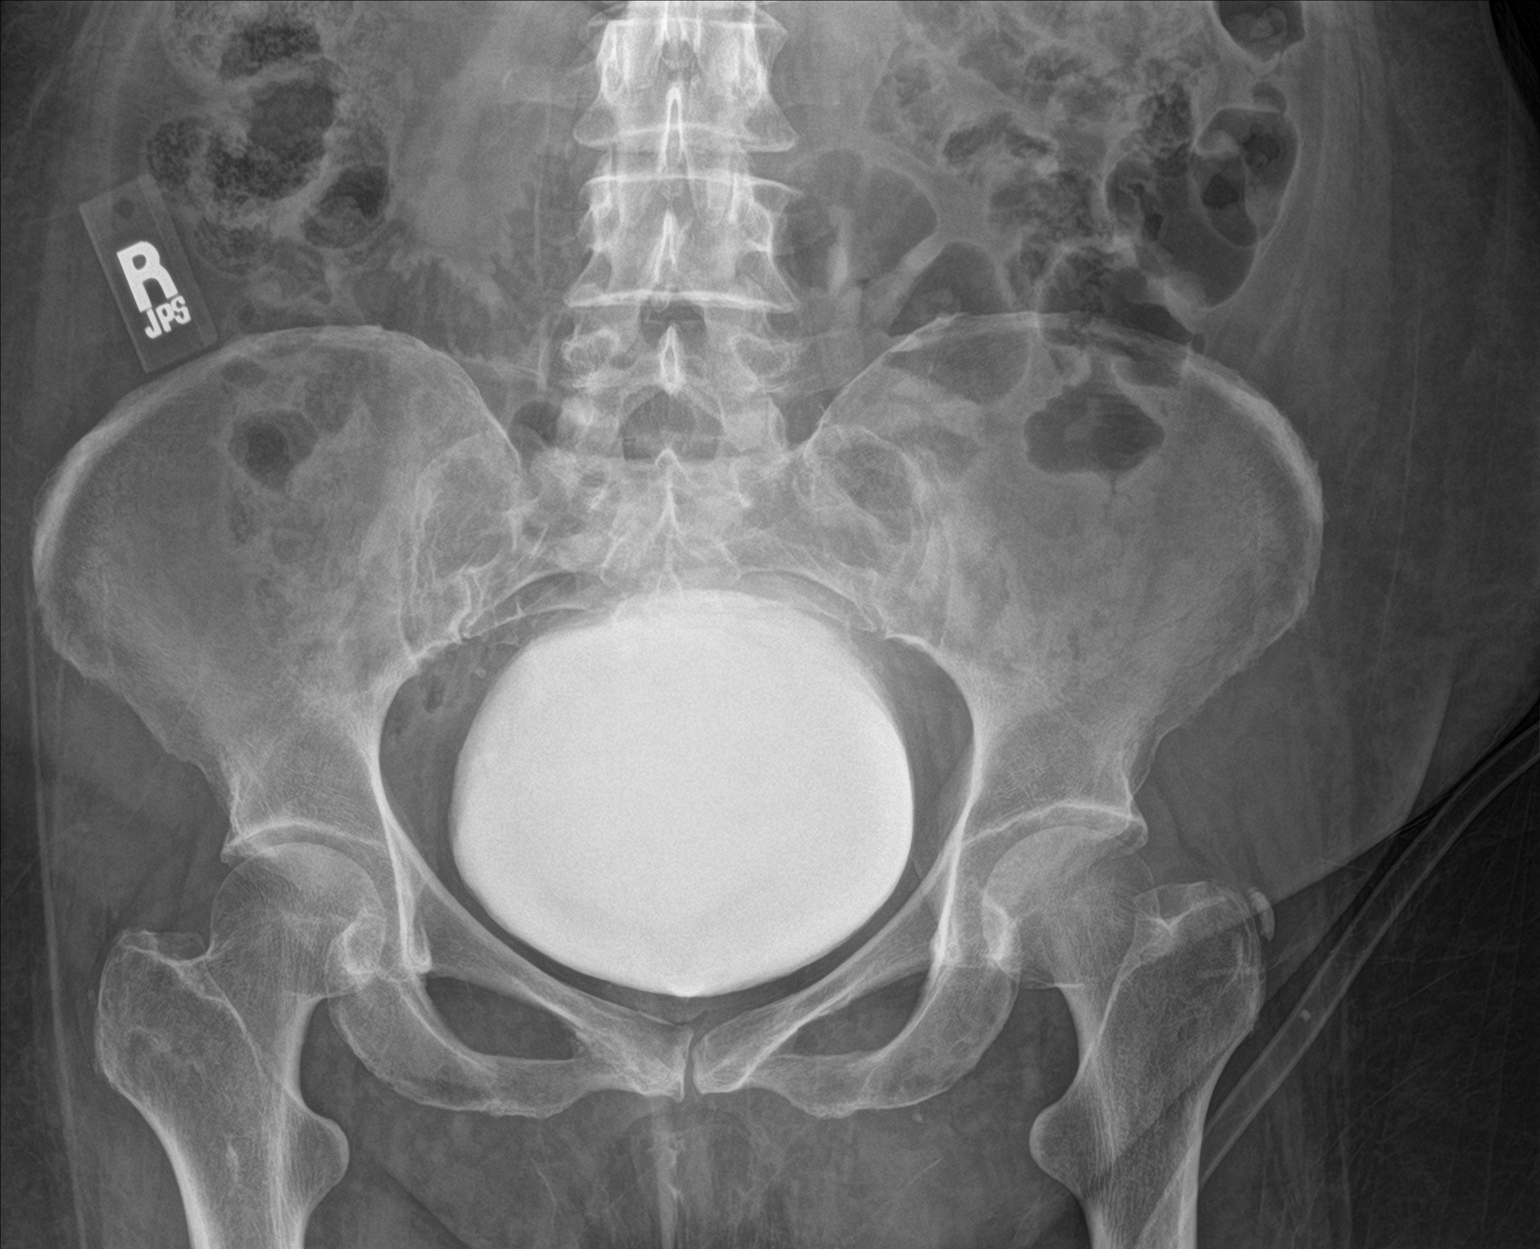

[1 of 1 positions shown; findings below may reference images not displayed]

FINDINGS: Contrast fills the urinary bladder.  Bladder is unremarkable.

Pelvis is intact.  Lower lumbar spine is within.

Lucency is noted along the greater trochanter. This may represent an
avulsion fracture.
IMPRESSION: 1. Possible avulsion fracture along the greater trochanter.
Recommend dedicated radiographs of the left hip further evaluation.
2. The pelvis is otherwise unremarkable.

## 2020-12-12 IMAGING — CT CT HEAD WITHOUT CONTRAST
4 series · 16 of 47 positions shown, 18 images · non-contrast
Comparison: 03/01/2019

CLINICAL DATA: Stroke follow-up.  Change in mental status.

EXAM:
CT HEAD WITHOUT CONTRAST
TECHNIQUE: Contiguous axial images were obtained from the base of the skull
through the vertex without intravenous contrast.

[Series 3: head without · axial · non-contrast · 0.42mm/px · z∈[-189,-69]mm · 7 of 33 slices shown, 9 images]
[im 5/33  brain]
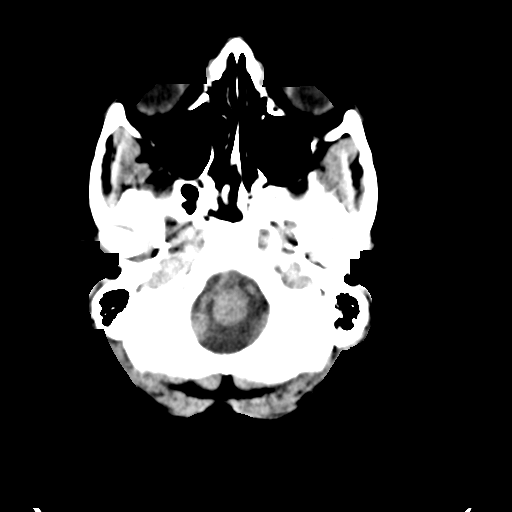
[im 5/33  bone]
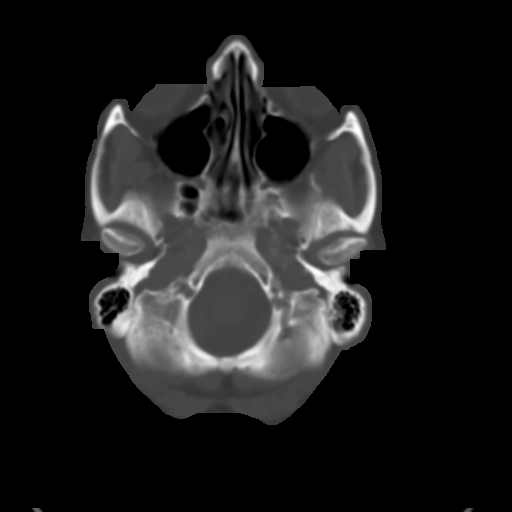
[im 9/33  brain]
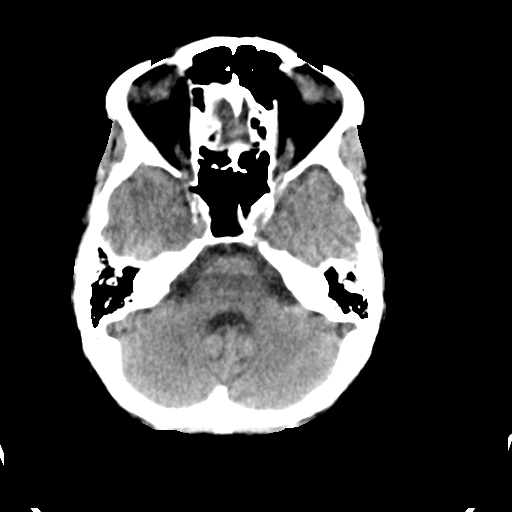
[im 13/33  brain]
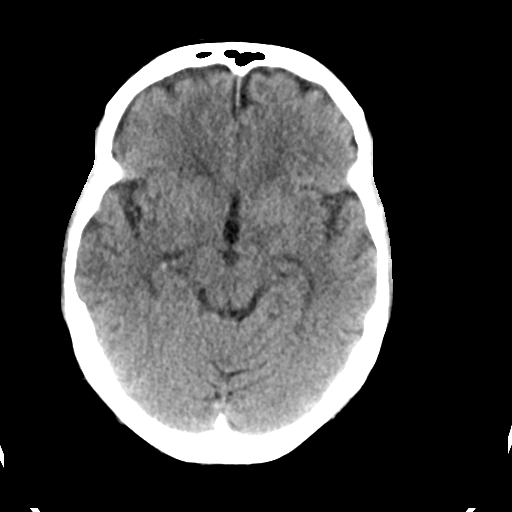
[im 17/33  brain]
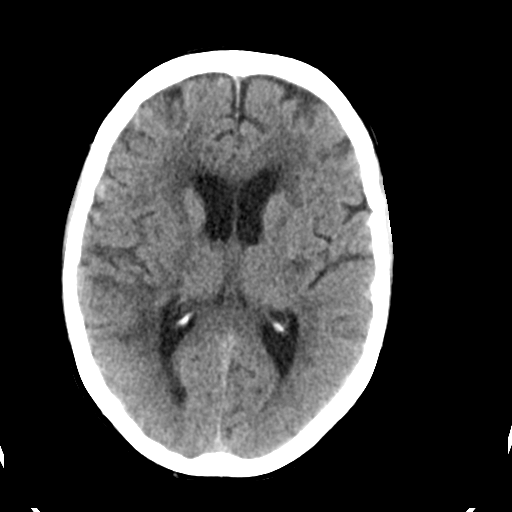
[im 21/33  brain]
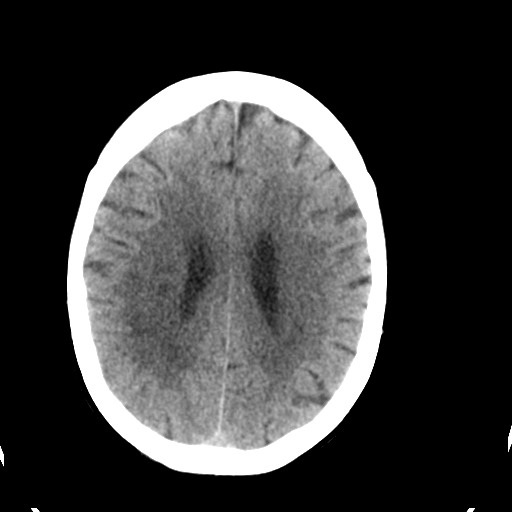
[im 21/33  bone]
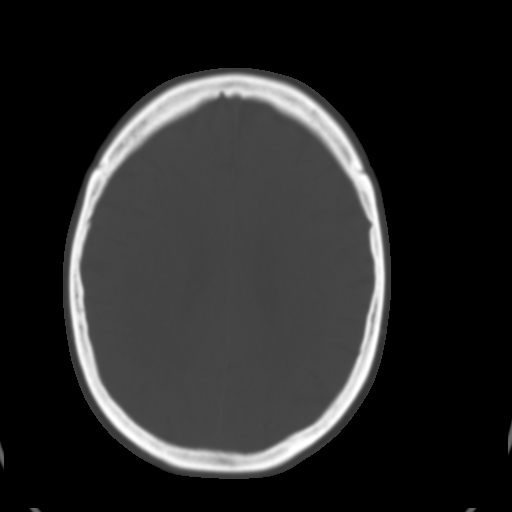
[im 25/33  brain]
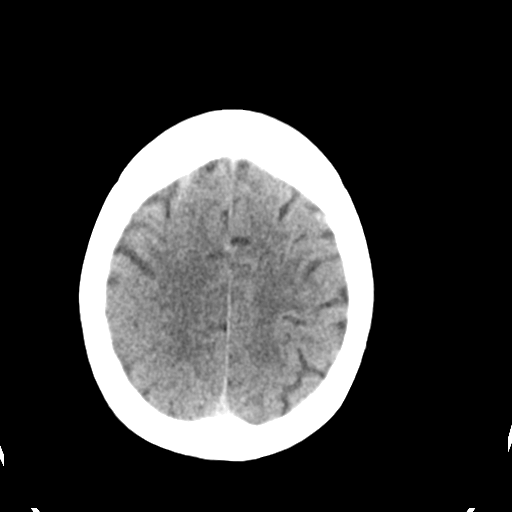
[im 29/33  brain]
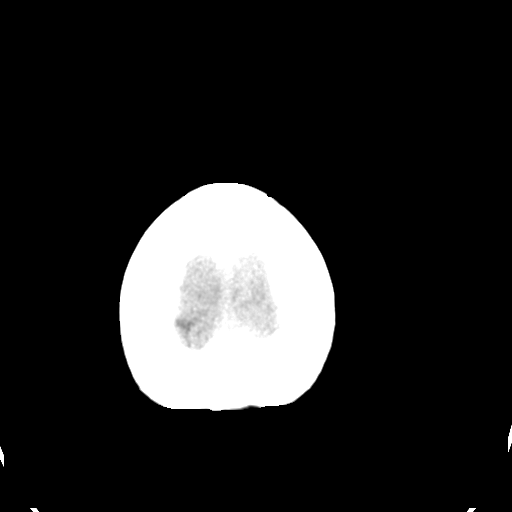

[Series 4: head bone · axial · 0.42mm/px · z∈[-193,-161]mm · 3 of 81 slices shown]
[im 9/81  bone]
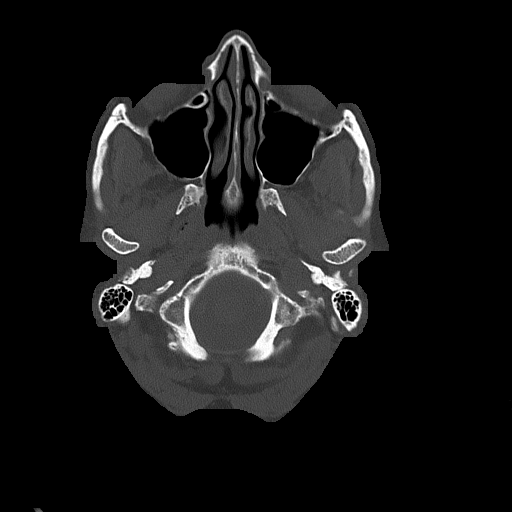
[im 17/81  bone]
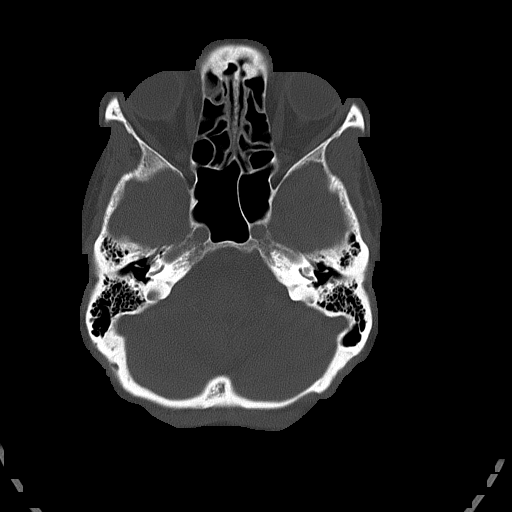
[im 25/81  bone]
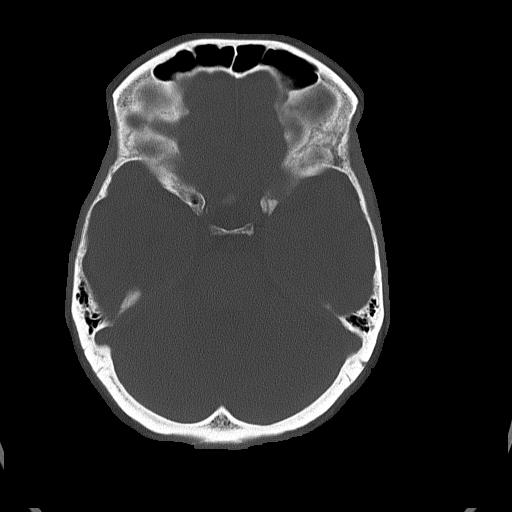

[Series 5: head without cor · coronal · non-contrast · 0.31mm/px · 3 of 67 slices shown]
[im 23/67  brain]
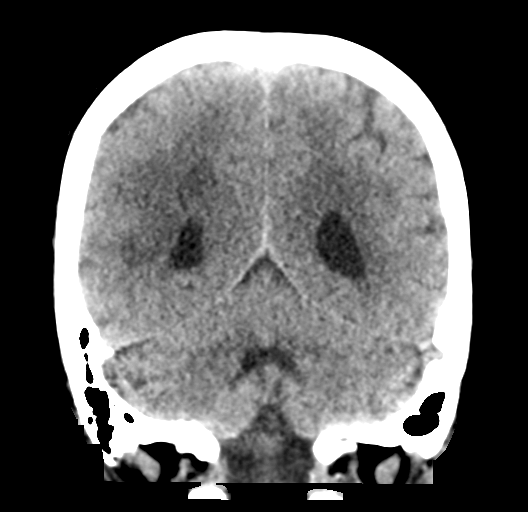
[im 30/67  brain]
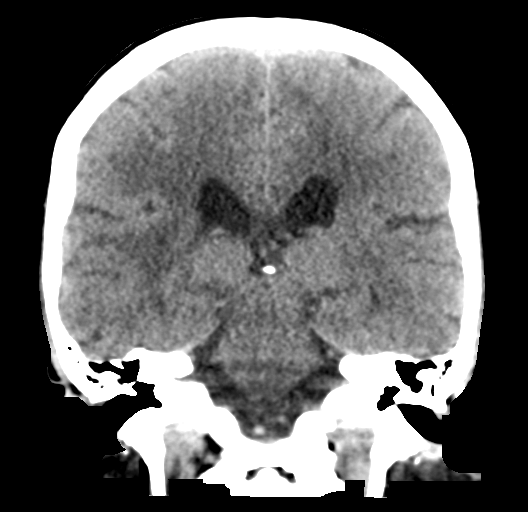
[im 37/67  brain]
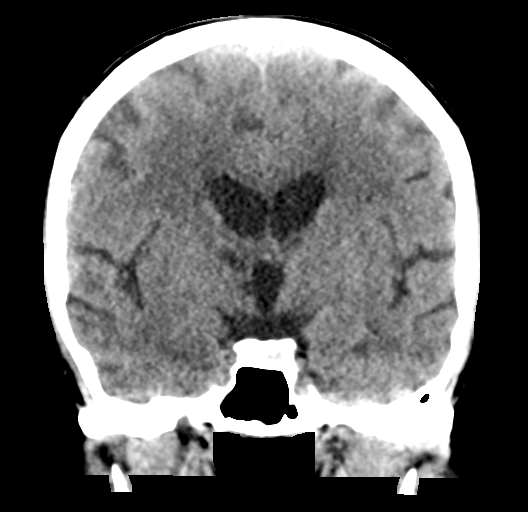

[Series 6: head without sag · sagittal · non-contrast · 0.31mm/px · 3 of 54 slices shown]
[im 18/54  brain]
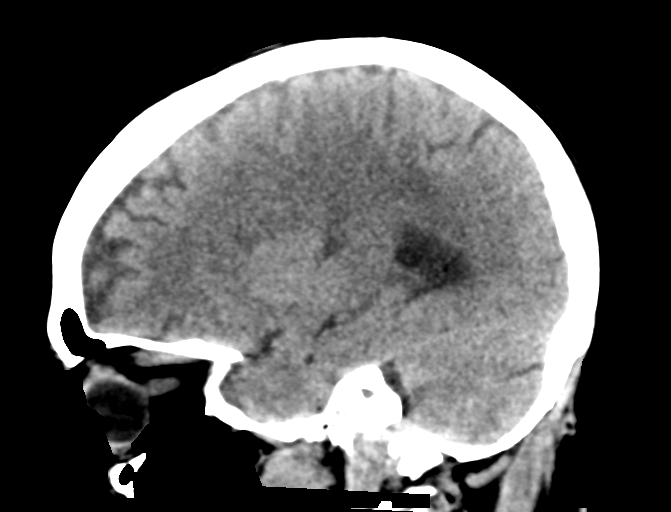
[im 27/54  brain]
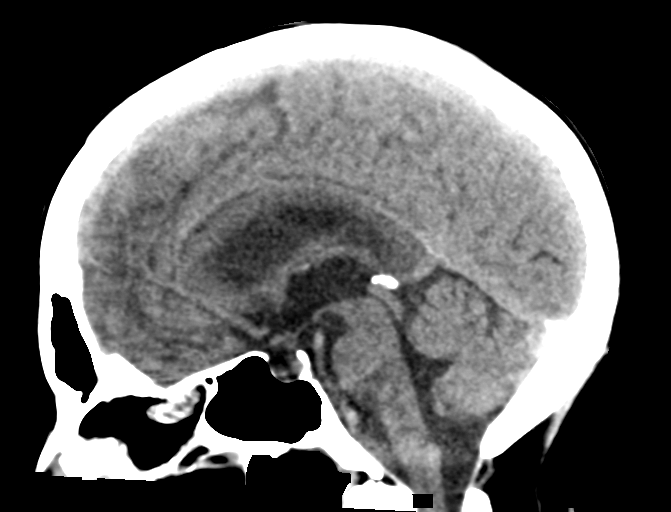
[im 36/54  brain]
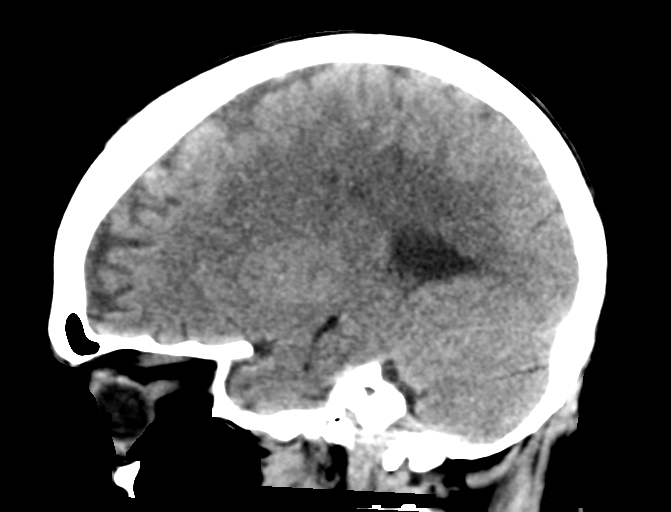

[16 of 47 positions shown; findings below may reference images not displayed]

FINDINGS: Brain: Unchanged does appearance of multifocal chronic ischemic
change, predominantly within the right hemisphere. Unchanged old
right caudothalamic small vessel infarct. No intracranial
hemorrhage. No midline shift or other mass effect. Unchanged size
and configuration of ventricles.

Vascular: No abnormal hyperdensity of the major intracranial
arteries or dural venous sinuses. No intracranial atherosclerosis.

Skull: The visualized skull base, calvarium and extracranial soft
tissues are normal.

Sinuses/Orbits: No fluid levels or advanced mucosal thickening of
the visualized paranasal sinuses. No mastoid or middle ear effusion.
The orbits are normal.
IMPRESSION: Unchanged examination with findings of chronic small vessel
ischemia. No acute abnormality.

## 2021-03-22 ENCOUNTER — Emergency Department (HOSPITAL_COMMUNITY): Payer: Medicare Other

## 2021-03-22 ENCOUNTER — Inpatient Hospital Stay (HOSPITAL_COMMUNITY)
Admission: EM | Admit: 2021-03-22 | Discharge: 2021-05-03 | DRG: 871 | Disposition: E | Payer: Medicare Other | Attending: Family Medicine | Admitting: Family Medicine

## 2021-03-22 ENCOUNTER — Encounter (HOSPITAL_COMMUNITY): Payer: Self-pay

## 2021-03-22 DIAGNOSIS — Z66 Do not resuscitate: Secondary | ICD-10-CM | POA: Diagnosis not present

## 2021-03-22 DIAGNOSIS — E11649 Type 2 diabetes mellitus with hypoglycemia without coma: Secondary | ICD-10-CM | POA: Diagnosis present

## 2021-03-22 DIAGNOSIS — E861 Hypovolemia: Secondary | ICD-10-CM | POA: Diagnosis present

## 2021-03-22 DIAGNOSIS — Z8616 Personal history of COVID-19: Secondary | ICD-10-CM | POA: Diagnosis not present

## 2021-03-22 DIAGNOSIS — Z7189 Other specified counseling: Secondary | ICD-10-CM

## 2021-03-22 DIAGNOSIS — Z7982 Long term (current) use of aspirin: Secondary | ICD-10-CM

## 2021-03-22 DIAGNOSIS — J9611 Chronic respiratory failure with hypoxia: Secondary | ICD-10-CM | POA: Diagnosis present

## 2021-03-22 DIAGNOSIS — N170 Acute kidney failure with tubular necrosis: Secondary | ICD-10-CM | POA: Diagnosis present

## 2021-03-22 DIAGNOSIS — E871 Hypo-osmolality and hyponatremia: Secondary | ICD-10-CM | POA: Diagnosis present

## 2021-03-22 DIAGNOSIS — I5031 Acute diastolic (congestive) heart failure: Secondary | ICD-10-CM | POA: Diagnosis not present

## 2021-03-22 DIAGNOSIS — G9341 Metabolic encephalopathy: Secondary | ICD-10-CM | POA: Diagnosis present

## 2021-03-22 DIAGNOSIS — K760 Fatty (change of) liver, not elsewhere classified: Secondary | ICD-10-CM | POA: Diagnosis present

## 2021-03-22 DIAGNOSIS — Z515 Encounter for palliative care: Secondary | ICD-10-CM

## 2021-03-22 DIAGNOSIS — Z794 Long term (current) use of insulin: Secondary | ICD-10-CM

## 2021-03-22 DIAGNOSIS — E559 Vitamin D deficiency, unspecified: Secondary | ICD-10-CM | POA: Diagnosis present

## 2021-03-22 DIAGNOSIS — I129 Hypertensive chronic kidney disease with stage 1 through stage 4 chronic kidney disease, or unspecified chronic kidney disease: Secondary | ICD-10-CM | POA: Diagnosis present

## 2021-03-22 DIAGNOSIS — E46 Unspecified protein-calorie malnutrition: Secondary | ICD-10-CM | POA: Diagnosis present

## 2021-03-22 DIAGNOSIS — N390 Urinary tract infection, site not specified: Secondary | ICD-10-CM | POA: Diagnosis present

## 2021-03-22 DIAGNOSIS — J189 Pneumonia, unspecified organism: Secondary | ICD-10-CM

## 2021-03-22 DIAGNOSIS — E875 Hyperkalemia: Secondary | ICD-10-CM | POA: Diagnosis present

## 2021-03-22 DIAGNOSIS — N179 Acute kidney failure, unspecified: Secondary | ICD-10-CM | POA: Diagnosis present

## 2021-03-22 DIAGNOSIS — E860 Dehydration: Secondary | ICD-10-CM | POA: Diagnosis present

## 2021-03-22 DIAGNOSIS — G894 Chronic pain syndrome: Secondary | ICD-10-CM

## 2021-03-22 DIAGNOSIS — E785 Hyperlipidemia, unspecified: Secondary | ICD-10-CM | POA: Diagnosis present

## 2021-03-22 DIAGNOSIS — A419 Sepsis, unspecified organism: Principal | ICD-10-CM | POA: Diagnosis present

## 2021-03-22 DIAGNOSIS — Z801 Family history of malignant neoplasm of trachea, bronchus and lung: Secondary | ICD-10-CM

## 2021-03-22 DIAGNOSIS — E210 Primary hyperparathyroidism: Secondary | ICD-10-CM | POA: Diagnosis present

## 2021-03-22 DIAGNOSIS — Z9981 Dependence on supplemental oxygen: Secondary | ICD-10-CM

## 2021-03-22 DIAGNOSIS — D509 Iron deficiency anemia, unspecified: Secondary | ICD-10-CM

## 2021-03-22 DIAGNOSIS — E1165 Type 2 diabetes mellitus with hyperglycemia: Secondary | ICD-10-CM | POA: Diagnosis not present

## 2021-03-22 DIAGNOSIS — E1122 Type 2 diabetes mellitus with diabetic chronic kidney disease: Secondary | ICD-10-CM | POA: Diagnosis present

## 2021-03-22 DIAGNOSIS — K219 Gastro-esophageal reflux disease without esophagitis: Secondary | ICD-10-CM | POA: Diagnosis present

## 2021-03-22 DIAGNOSIS — E162 Hypoglycemia, unspecified: Secondary | ICD-10-CM | POA: Diagnosis present

## 2021-03-22 DIAGNOSIS — Z818 Family history of other mental and behavioral disorders: Secondary | ICD-10-CM

## 2021-03-22 DIAGNOSIS — Z7984 Long term (current) use of oral hypoglycemic drugs: Secondary | ICD-10-CM

## 2021-03-22 DIAGNOSIS — G928 Other toxic encephalopathy: Secondary | ICD-10-CM | POA: Diagnosis present

## 2021-03-22 DIAGNOSIS — Z8673 Personal history of transient ischemic attack (TIA), and cerebral infarction without residual deficits: Secondary | ICD-10-CM

## 2021-03-22 DIAGNOSIS — N183 Chronic kidney disease, stage 3 unspecified: Secondary | ICD-10-CM | POA: Diagnosis present

## 2021-03-22 DIAGNOSIS — E872 Acidosis: Secondary | ICD-10-CM | POA: Diagnosis present

## 2021-03-22 DIAGNOSIS — F0390 Unspecified dementia without behavioral disturbance: Secondary | ICD-10-CM | POA: Diagnosis present

## 2021-03-22 DIAGNOSIS — J449 Chronic obstructive pulmonary disease, unspecified: Secondary | ICD-10-CM | POA: Diagnosis present

## 2021-03-22 DIAGNOSIS — G35 Multiple sclerosis: Secondary | ICD-10-CM | POA: Diagnosis present

## 2021-03-22 DIAGNOSIS — T17908A Unspecified foreign body in respiratory tract, part unspecified causing other injury, initial encounter: Secondary | ICD-10-CM

## 2021-03-22 DIAGNOSIS — Z825 Family history of asthma and other chronic lower respiratory diseases: Secondary | ICD-10-CM

## 2021-03-22 DIAGNOSIS — K5289 Other specified noninfective gastroenteritis and colitis: Secondary | ICD-10-CM

## 2021-03-22 DIAGNOSIS — L89899 Pressure ulcer of other site, unspecified stage: Secondary | ICD-10-CM

## 2021-03-22 DIAGNOSIS — Z0189 Encounter for other specified special examinations: Secondary | ICD-10-CM

## 2021-03-22 DIAGNOSIS — R531 Weakness: Secondary | ICD-10-CM | POA: Diagnosis not present

## 2021-03-22 DIAGNOSIS — I10 Essential (primary) hypertension: Secondary | ICD-10-CM | POA: Diagnosis present

## 2021-03-22 DIAGNOSIS — Z79899 Other long term (current) drug therapy: Secondary | ICD-10-CM

## 2021-03-22 DIAGNOSIS — R6520 Severe sepsis without septic shock: Secondary | ICD-10-CM | POA: Diagnosis present

## 2021-03-22 HISTORY — DX: Dysphagia, oropharyngeal phase: R13.12

## 2021-03-22 HISTORY — DX: Chronic respiratory failure, unspecified whether with hypoxia or hypercapnia: J96.10

## 2021-03-22 HISTORY — DX: Monoplegia of upper limb affecting unspecified side: G83.20

## 2021-03-22 LAB — CBC WITH DIFFERENTIAL/PLATELET
Abs Immature Granulocytes: 0.17 10*3/uL — ABNORMAL HIGH (ref 0.00–0.07)
Basophils Absolute: 0.1 10*3/uL (ref 0.0–0.1)
Basophils Relative: 0 %
Eosinophils Absolute: 0 10*3/uL (ref 0.0–0.5)
Eosinophils Relative: 0 %
HCT: 26.3 % — ABNORMAL LOW (ref 36.0–46.0)
Hemoglobin: 7.8 g/dL — ABNORMAL LOW (ref 12.0–15.0)
Immature Granulocytes: 1 %
Lymphocytes Relative: 8 %
Lymphs Abs: 1.8 10*3/uL (ref 0.7–4.0)
MCH: 23.4 pg — ABNORMAL LOW (ref 26.0–34.0)
MCHC: 29.7 g/dL — ABNORMAL LOW (ref 30.0–36.0)
MCV: 79 fL — ABNORMAL LOW (ref 80.0–100.0)
Monocytes Absolute: 0.8 10*3/uL (ref 0.1–1.0)
Monocytes Relative: 3 %
Neutro Abs: 20.5 10*3/uL — ABNORMAL HIGH (ref 1.7–7.7)
Neutrophils Relative %: 88 %
Platelets: 473 10*3/uL — ABNORMAL HIGH (ref 150–400)
RBC: 3.33 MIL/uL — ABNORMAL LOW (ref 3.87–5.11)
RDW: 19.8 % — ABNORMAL HIGH (ref 11.5–15.5)
WBC: 23.3 10*3/uL — ABNORMAL HIGH (ref 4.0–10.5)
nRBC: 0 % (ref 0.0–0.2)

## 2021-03-22 LAB — URINALYSIS, ROUTINE W REFLEX MICROSCOPIC
Bilirubin Urine: NEGATIVE
Glucose, UA: NEGATIVE mg/dL
Hgb urine dipstick: NEGATIVE
Ketones, ur: NEGATIVE mg/dL
Leukocytes,Ua: NEGATIVE
Nitrite: NEGATIVE
Protein, ur: 30 mg/dL — AB
Specific Gravity, Urine: 1.013 (ref 1.005–1.030)
pH: 5 (ref 5.0–8.0)

## 2021-03-22 LAB — CBG MONITORING, ED
Glucose-Capillary: 78 mg/dL (ref 70–99)
Glucose-Capillary: 26 mg/dL — CL (ref 70–99)
Glucose-Capillary: 107 mg/dL — ABNORMAL HIGH (ref 70–99)
Glucose-Capillary: 92 mg/dL (ref 70–99)
Glucose-Capillary: 134 mg/dL — ABNORMAL HIGH (ref 70–99)

## 2021-03-22 LAB — BRAIN NATRIURETIC PEPTIDE: B Natriuretic Peptide: 210.6 pg/mL — ABNORMAL HIGH (ref 0.0–100.0)

## 2021-03-22 LAB — COMPREHENSIVE METABOLIC PANEL
ALT: 16 U/L (ref 0–44)
AST: 21 U/L (ref 15–41)
Albumin: 2.9 g/dL — ABNORMAL LOW (ref 3.5–5.0)
Alkaline Phosphatase: 56 U/L (ref 38–126)
Anion gap: 20 — ABNORMAL HIGH (ref 5–15)
BUN: 83 mg/dL — ABNORMAL HIGH (ref 8–23)
CO2: 15 mmol/L — ABNORMAL LOW (ref 22–32)
Calcium: 9.2 mg/dL (ref 8.9–10.3)
Chloride: 98 mmol/L (ref 98–111)
Creatinine, Ser: 4.32 mg/dL — ABNORMAL HIGH (ref 0.44–1.00)
GFR, Estimated: 10 mL/min — ABNORMAL LOW (ref >60)
Glucose, Bld: 39 mg/dL — CL (ref 70–99)
Potassium: 7.2 mmol/L (ref 3.5–5.1)
Sodium: 133 mmol/L — ABNORMAL LOW (ref 135–145)
Total Bilirubin: 0.3 mg/dL (ref 0.3–1.2)
Total Protein: 6.1 g/dL — ABNORMAL LOW (ref 6.5–8.1)

## 2021-03-22 LAB — LACTIC ACID, PLASMA: Lactic Acid, Venous: 7.9 mmol/L (ref 0.5–1.9)

## 2021-03-22 LAB — RESP PANEL BY RT-PCR (FLU A&B, COVID) ARPGX2
Influenza A by PCR: NEGATIVE
Influenza B by PCR: NEGATIVE
SARS Coronavirus 2 by RT PCR: NEGATIVE

## 2021-03-22 LAB — POC OCCULT BLOOD, ED: Fecal Occult Bld: NEGATIVE

## 2021-03-22 MED ORDER — DEXTROSE 50 % IV SOLN
INTRAVENOUS | Status: AC
  Administered 2021-03-22: 20:00:00 50 mL via INTRAVENOUS
  Filled 2021-03-22: qty 50

## 2021-03-22 MED ORDER — LACTATED RINGERS IV BOLUS (SEPSIS)
250.0000 mL | Freq: Once | INTRAVENOUS | Status: AC
  Administered 2021-03-22: 23:00:00 250 mL via INTRAVENOUS

## 2021-03-22 MED ORDER — LACTATED RINGERS IV SOLN: INTRAVENOUS | Status: DC

## 2021-03-22 MED ORDER — INSULIN ASPART 100 UNIT/ML IV SOLN
5.0000 [IU] | Freq: Once | INTRAVENOUS | Status: AC
  Administered 2021-03-22: 5 [IU] via INTRAVENOUS
  Filled 2021-03-22: qty 0.05

## 2021-03-22 MED ORDER — VANCOMYCIN HCL IN DEXTROSE 1-5 GM/200ML-% IV SOLN
1000.0000 mg | Freq: Once | INTRAVENOUS | Status: AC
  Administered 2021-03-23: 1000 mg via INTRAVENOUS
  Filled 2021-03-22: qty 200

## 2021-03-22 MED ORDER — SODIUM CHLORIDE 0.9 % IV SOLN: INTRAVENOUS | Status: DC

## 2021-03-22 MED ORDER — DEXTROSE 50 % IV SOLN: 1.0000 | Freq: Once | INTRAVENOUS | Status: AC

## 2021-03-22 MED ORDER — SODIUM CHLORIDE 0.9 % IV SOLN
2.0000 g | Freq: Once | INTRAVENOUS | Status: AC
  Administered 2021-03-22: 2 g via INTRAVENOUS
  Filled 2021-03-22: qty 2

## 2021-03-22 MED ORDER — LACTATED RINGERS IV BOLUS (SEPSIS)
1000.0000 mL | Freq: Once | INTRAVENOUS | Status: AC
  Administered 2021-03-22: 22:00:00 1000 mL via INTRAVENOUS

## 2021-03-22 MED ORDER — DEXTROSE 5 % IV SOLN: Freq: Once | INTRAVENOUS | Status: AC

## 2021-03-22 MED ORDER — SODIUM CHLORIDE 0.9 % IV SOLN: 1.0000 g | Freq: Once | INTRAVENOUS | Status: DC

## 2021-03-22 MED ORDER — DEXTROSE 50 % IV SOLN
1.0000 | Freq: Once | INTRAVENOUS | Status: AC
  Administered 2021-03-22: 50 mL via INTRAVENOUS
  Filled 2021-03-22: qty 50

## 2021-03-22 MED ORDER — METRONIDAZOLE 500 MG/100ML IV SOLN
500.0000 mg | Freq: Once | INTRAVENOUS | Status: AC
  Administered 2021-03-23: 500 mg via INTRAVENOUS
  Filled 2021-03-22: qty 100

## 2021-03-22 MED ORDER — CALCIUM GLUCONATE-NACL 1-0.675 GM/50ML-% IV SOLN
1.0000 g | Freq: Once | INTRAVENOUS | Status: AC
  Administered 2021-03-22: 1000 mg via INTRAVENOUS
  Filled 2021-03-22: qty 50

## 2021-03-22 NOTE — ED Provider Notes (Signed)
[facility]-EMERGENCY DEPT Provider Note   CSN: [id number] Arrival date & time: 03/06/2021  1816     History Chief Complaint  Patient presents with   Hypoglycemia        Altered Mental Status    Janice Little is a 75 y.o. female.  75 year old female presents from nursing home with low blood sugar.  Patient has been running low blood sugars all day.  She has been treated with glucagon at the facility where she is.  Despite that blood sugar was 46 when EMS arrived.  Patient given D10 and blood sugar improved to 130.  No recent history of fever or vomiting.  Has a history of chronic respiratory failure.  Facility did tell EMS that patient has been more lethargic recently.  No reported history of fevers. was sent here for further evaluation      Past Medical History:  Diagnosis Date   Chronic kidney disease (CKD), stage II (mild)    Chronic pain    Chronic respiratory failure (HCC)    DM type 2 (diabetes mellitus, type 2) (HCC)    Dysphagia, oropharyngeal phase    Fatty liver    per CT   GERD (gastroesophageal reflux disease)    History of hepatitis as a child    age 21 (jaundice)   HTN (hypertension)    Hyperlipidemia    Monoplegia of upper limb (HCC)    MS (multiple sclerosis) (HCC)    Obesity    Osteopenia    Primary hyperparathyroidism (HCC)    s/p surgery removal of parathyroid (Dr. Sharl Ma, Dr. Gerrit Friends)   Vitamin D deficiency     Patient Active Problem List   Diagnosis Date Noted   Acute cholecystitis 10/20/2020   Acute hypoxemic respiratory failure due to COVID-19 (HCC) 10/16/2020   Acute metabolic encephalopathy 10/16/2020   Gross hematuria    Acute sepsis (HCC)    Sepsis secondary to UTI (HCC)    Diarrhea    Elevated sed rate 08/12/2019   Elevated C-reactive protein (CRP) 08/12/2019   Left-sided neglect 08/01/2019   Myalgia 08/01/2019   Pressure injury of skin 03/04/2019   Acute lower UTI    Stroke (HCC) 03/02/2019   Acute CVA  (cerebrovascular accident) (HCC) 03/01/2019   Hypertensive urgency 12/19/2018   History of myelitis 12/26/2017   History of optic neuritis 04/02/2015   Diplopia 04/02/2015   Urinary incontinence 04/02/2015   Other fatigue 04/02/2015   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   Numbness 04/02/2015   Ataxic gait 04/02/2015   Essential tremor 04/02/2015   PRIMARY HYPERPARATHYROIDISM 02/01/2008   OSTEOPOROSIS 02/01/2008   Type 2 diabetes mellitus with hypoglycemia without coma (HCC) 01/31/2008   Hyperlipidemia 01/31/2008   Multiple sclerosis (HCC) 01/31/2008   Optic neuritis 01/31/2008   Essential hypertension 01/31/2008   GERD 01/31/2008    Past Surgical History:  Procedure Laterality Date   ANTERIOR CRUCIATE LIGAMENT REPAIR Left    LOOP RECORDER INSERTION N/A 03/05/2019   Procedure: LOOP RECORDER INSERTION;  Surgeon: Marinus Maw, MD;  Location: [facility] INVASIVE CV LAB;  Service: Cardiovascular;  Laterality: N/A;   VESICOVAGINAL FISTULA CLOSURE W/ TAH       OB History   No obstetric history on file.     Family History  Problem Relation Age of Onset   Mental illness Mother    COPD Father    Lung cancer Father     Social History   Tobacco Use  Smoking status: Never   Smokeless tobacco: Never  Substance Use Topics   Alcohol use: No   Drug use: No    Home Medications Prior to Admission medications   Medication Sig Start Date End Date Taking? Authorizing Provider  acetaminophen (TYLENOL) 325 MG tablet Take 2 tablets (650 mg total) by mouth every 4 (four) hours as needed for mild pain (or temp > 37.5 C (99.5 F)). 03/05/19   Vassie Loll, MD  amLODipine (NORVASC) 5 MG tablet Take 5 mg by mouth daily. 07/24/19   [provider]  amoxicillin-clavulanate (AUGMENTIN) 875-125 MG tablet Take 1 tablet by mouth every 12 (twelve) hours. 10/23/20   Tyrone Nine, MD  aspirin 325 MG tablet Take 1 tablet (325 mg total) by mouth daily. 03/06/19   Vassie Loll, MD  Cholecalciferol (VITAMIN D) 50 MCG (2000 UT) CAPS Take 2,000 Units by mouth daily.    [provider]  DULoxetine (CYMBALTA) 60 MG capsule Take 60 mg by mouth daily.    [provider]  gabapentin (NEURONTIN) 100 MG capsule Take 100 mg by mouth 3 (three) times daily.    [provider]  hydrALAZINE (APRESOLINE) 25 MG tablet Take 3 tablets (75 mg total) by mouth every 8 (eight) hours. 03/05/19   Vassie Loll, MD  Infant Care Products [facility]) OINT Apply 1 application topically daily. Apply to buttocks.    [provider]  insulin glargine (LANTUS) 100 UNIT/ML injection Inject 34 Units into the skin at bedtime.    [provider]  insulin lispro (HUMALOG) 100 UNIT/ML injection Inject 2-11 Units into the skin 3 (three) times daily before meals. Sliding scale: 101-150=2 U 151-200=3 U 201-250=5 U 251-300=7 U 301-350=9 U >350=11 U  Call MD for BS>400 or <60    [provider]  lisinopril (ZESTRIL) 40 MG tablet Take 40 mg by mouth daily.    [provider]  Melatonin 3 MG TABS Take 3 mg by mouth at bedtime.     [provider]  metFORMIN (GLUCOPHAGE-XR) 500 MG 24 hr tablet Take 1 tablet (500 mg total) by mouth 2 (two) times a day. 03/05/19   Vassie Loll, MD  Multiple Vitamins-Minerals (CEROVITE SENIOR) TABS Take 1 tablet by mouth daily.    [provider]  omeprazole (PRILOSEC OTC) 20 MG tablet Take 20 mg by mouth daily.    [provider]  polyethylene glycol powder (GLYCOLAX/MIRALAX) 17 GM/SCOOP powder Take 17 g by mouth daily as needed for mild constipation.    [provider]    Allergies    Patient has no known allergies.  Review of Systems   Review of Systems  Unable to perform ROS: Acuity of condition   Physical Exam Updated Vital Signs SpO2 94%   Physical Exam Vitals and nursing note reviewed.  Constitutional:      General: She is not in acute distress.     Appearance: Normal appearance. She is well-developed. She is not toxic-appearing.  HENT:     Head: Normocephalic and atraumatic.  Eyes:     General: Lids are normal.     Conjunctiva/sclera: Conjunctivae normal.     Pupils: Pupils are equal, round, and reactive to light.  Neck:     Thyroid: No thyroid mass.     Trachea: No tracheal deviation.  Cardiovascular:     Rate and Rhythm: Normal rate and regular rhythm.     Heart sounds: Normal heart sounds. No murmur heard.   No gallop.  Pulmonary:     Effort: Pulmonary effort is normal. No respiratory distress.     Breath sounds: Normal breath sounds. No stridor. No decreased breath sounds, wheezing, rhonchi or rales.  Abdominal:     General: There is no distension.     Palpations: Abdomen is soft.     Tenderness: There is no abdominal tenderness. There is no rebound.  Musculoskeletal:        General: No tenderness. Normal range of motion.     Cervical back: Normal range of motion and neck supple.  Skin:    General: Skin is warm and dry.     Findings: No abrasion or rash.  Neurological:     Mental Status: She is lethargic and disoriented.     GCS: GCS eye subscore is 4. GCS verbal subscore is 5. GCS motor subscore is 6.     Cranial Nerves: Cranial nerves are intact. No cranial nerve deficit.     Sensory: No sensory deficit.     Motor: Weakness present. No tremor.  Psychiatric:        Attention and Perception: She is inattentive.        Mood and Affect: Affect is flat.        Speech: Speech is delayed.        Behavior: Behavior is slowed.    ED Results / Procedures / Treatments   Labs (all labs ordered are listed, but only abnormal results are displayed) Labs Reviewed  URINE CULTURE  CULTURE, BLOOD (ROUTINE X 2)  CULTURE, BLOOD (ROUTINE X 2)  CBC WITH DIFFERENTIAL/PLATELET  COMPREHENSIVE METABOLIC PANEL  URINALYSIS, ROUTINE W REFLEX MICROSCOPIC  LACTIC ACID, PLASMA  CBG MONITORING, ED    EKG None  Radiology No results  found.  Procedures Procedures   Medications Ordered in ED Medications  0.9 %  sodium chloride infusion (has no administration in time range)    ED Course  I have reviewed the triage vital signs and the nursing notes.  Pertinent labs & imaging results that were available during my care of the patient were reviewed by me and considered in my medical decision making (see chart for details).    MDM Rules/Calculators/A&P                          Patient hypoglycemic here and given amp of D50 and subsequently placed on D5 drip due to persistent hypoglycemia.  Patient also hypothermic and placed on Bair hugger.  Patient has evidence of sepsis likely urinary source.  Started on broad-spectrum coverage and given 30/kg of saline.  Attempted to get hold of patient's power of attorney which was unsuccessful.  Patient's blood pressure has remained stable here.  Discussed with ICU physician who feels that patient can be managed in stepdown unit and patient to be admitted to the hospitalist service  CRITICAL CARE Performed by: Toy Baker Total critical care time: 75 minutes Critical care time was exclusive of separately billable procedures and treating other patients. Critical care was necessary to treat or prevent imminent or life-threatening deterioration. Critical care was time spent personally by me on the following activities: development of treatment plan with patient and/or surrogate as well as nursing, discussions with consultants, evaluation of patient's response to treatment, examination of patient, obtaining history from patient or surrogate, ordering and performing treatments and interventions, ordering and review of laboratory studies, ordering and review of radiographic studies, pulse oximetry and re-evaluation of patient's condition.  Final  Clinical Impression(s) / ED Diagnoses Final diagnoses:  None    Rx / DC Orders ED Discharge Orders     None        Lorre Nick,  MD 03/12/2021 2214

## 2021-03-22 NOTE — Progress Notes (Signed)
A consult was received from an ED physician for Vancomycin and Cefepime per pharmacy dosing.  The patient's profile has been reviewed for ht/wt/allergies/indication/available labs.    A one time order has been placed for Vancomycin 1gm and Cefepime 2gm IV.    Further antibiotics/pharmacy consults should be ordered by admitting physician if indicated.                       Thank you, Maryellen Pile, PharmD 29-Mar-2021  9:43 PM

## 2021-03-22 NOTE — H&P (Signed)
History and Physical   Janice Little ZOX:[id number] DOB: May 19, 1946 DOA: April 15, 2021  Referring MD/NP/PA: Dr. Bruce Donath   PCP: Juluis Rainier, MD   Outpatient Specialists: None  Patient coming from: [facility] skilled nursing facility  Chief Complaint: Low blood sugar  HPI: Janice Little is a 75 y.o. female with medical history significant of diabetes, chronic kidney disease stage III, chronic pain syndrome, chronic respiratory failure secondary to COPD, multiple sclerosis, GERD, essential hypertension and primary hyperparathyroidism s/p surgery who presents to the ER with hypoglycemia by EMS.  Patient apparently started having diarrhea today at [facility].  Her blood glucose was noted to be 34 then 7077 and 25.  She was given glucagon x4.  She has not received any insulin today.  When patient arrived the ER she appeared drowsy unable to give any history.  Her initial blood sugar was 46.  Was given D10 and blood sugar went to 130.  She is so lethargic this not much history was obtained.  Patient was noted to be in acute kidney failure with creatinine more than 4.  It was previously normal.  Also hyperkalemia potassium more than 7 as well as lactic acidosis.  Suspected pyuria and severe sepsis.  Her blood pressure is however stable.  She is hypoxic but has chronically been on 2 L of oxygen.  At this point she is being admitted as a case of severe sepsis with multiple electrolyte abnormalities.  Critical care consulted and will follow with [place].  ED Course: Temperature is 95.4 initial blood pressure 102/51 with pulse 102 respiratory 59 oxygen sat 94% on 2 L.  Her white count is 23.3 hemoglobin 7.8 and platelets 473.  Sodium 133 potassium 7.2 chloride 98 CO2 15 glucose 39 BUN 83 creatinine 4.32 calcium 9.2 with a gap of 20 alkaline phosphatase 56 albumin 2.9 and BNP of 210.  Lactic acid 7.9 initially rose to 8.7.  Initial glucose of 39.  COVID-19 screen is negative.  Urinalysis showed  clear urine.  Many bacteria.  WBC 11-20.  Nitrite is negative and leukocyte negative.  Chest x-ray showed pulmonary vascular congestion.  Head CT without contrast was negative.  Patient being admitted with case of severe sepsis, hyperkalemia, hypoglycemia and lactic acidosis.  Review of Systems: As per HPI otherwise 10 point review of systems negative.    Past Medical History:  Diagnosis Date   Chronic kidney disease (CKD), stage II (mild)    Chronic pain    Chronic respiratory failure (HCC)    DM type 2 (diabetes mellitus, type 2) (HCC)    Dysphagia, oropharyngeal phase    Fatty liver    per CT   GERD (gastroesophageal reflux disease)    History of hepatitis as a child    age 29 (jaundice)   HTN (hypertension)    Hyperlipidemia    Monoplegia of upper limb (HCC)    MS (multiple sclerosis) (HCC)    Obesity    Osteopenia    Primary hyperparathyroidism (HCC)    s/p surgery removal of parathyroid (Dr. Sharl Ma, Dr. Gerrit Friends)   Vitamin D deficiency     Past Surgical History:  Procedure Laterality Date   ANTERIOR CRUCIATE LIGAMENT REPAIR Left    LOOP RECORDER INSERTION N/A 03/05/2019   Procedure: LOOP RECORDER INSERTION;  Surgeon: Marinus Maw, MD;  Location: [facility]AS[facility];  Service: Cardiovascular;  Laterality: N/A;   VESICOVAGINAL FISTULA CLOSURE W/ TAH       reports that she has never smoked. She  has never used smokeless tobacco. She reports that she does not drink alcohol and does not use drugs.  No Known Allergies  Family History  Problem Relation Age of Onset   Mental illness Mother    COPD Father    Lung cancer Father      Prior to Admission medications   Medication Sig Start Date End Date Taking? Authorizing Provider  acetaminophen (TYLENOL) 325 MG tablet Take 2 tablets (650 mg total) by mouth every 4 (four) hours as needed for mild pain (or temp > 37.5 C (99.5 F)). 03/05/19   Vassie Loll, MD  amLODipine (NORVASC) 5 MG tablet Take 5 mg by mouth daily. 07/24/19    [provider]  amoxicillin-clavulanate (AUGMENTIN) 875-125 MG tablet Take 1 tablet by mouth every 12 (twelve) hours. 10/23/20   Tyrone Nine, MD  aspirin 325 MG tablet Take 1 tablet (325 mg total) by mouth daily. 03/06/19   Vassie Loll, MD  Cholecalciferol (VITAMIN D) 50 MCG (2000 UT) CAPS Take 2,000 Units by mouth daily.    [provider]  DULoxetine (CYMBALTA) 60 MG capsule Take 60 mg by mouth daily.    [provider]  gabapentin (NEURONTIN) 100 MG capsule Take 100 mg by mouth 3 (three) times daily.    [provider]  hydrALAZINE (APRESOLINE) 25 MG tablet Take 3 tablets (75 mg total) by mouth every 8 (eight) hours. 03/05/19   Vassie Loll, MD  Inf[employer] [facility]) OINT Apply 1 application topically daily. Apply to buttocks.    [provider]  insulin glargine (LANTUS) 100 UNIT/ML injection Inject 34 Units into the skin at bedtime.    [provider]  insulin lispro (HUMALOG) 100 UNIT/ML injection Inject 2-11 Units into the skin 3 (three) times daily before meals. Sliding scale: 101-150=2 U 151-200=3 U 201-250=5 U 251-300=7 U 301-350=9 U >350=11 U  Call MD for BS>400 or <60    [provider]  lisinopril (ZESTRIL) 40 MG tablet Take 40 mg by mouth daily.    [provider]  Melatonin 3 MG TABS Take 3 mg by mouth at bedtime.     [provider]  metFORMIN (GLUCOPHAGE-XR) 500 MG 24 hr tablet Take 1 tablet (500 mg total) by mouth 2 (two) times a day. 03/05/19   Vassie Loll, MD  Multiple Vitamins-Minerals (CEROVITE SENIOR) TABS Take 1 tablet by mouth daily.    [provider]  omeprazole (PRILOSEC OTC) 20 MG tablet Take 20 mg by mouth daily.    [provider]  polyethylene glycol powder (GLYCOLAX/MIRALAX) 17 GM/SCOOP powder Take 17 g by mouth daily as needed for mild constipation.    [provider]    Physical Exam: Vitals:   04-14-21 1844 04-14-2021 1930 04/14/21 2037  April 14, 2021 2131  BP: (!) 102/51 113/83    Pulse: 86 86    Resp: (!) 24 (!) 22    Temp:   (!) 96 F (35.6 C) (!) 95.4 F (35.2 C)  TempSrc: Oral  Rectal Rectal  SpO2: 94% 100%        Constitutional: Confused, not agitated, hypothermic Vitals:   04/14/2021 1844 14-Apr-2021 1930 April 14, 2021 2037 04/14/2021 2131  BP: (!) 102/51 113/83    Pulse: 86 86    Resp: (!) 24 (!) 22    Temp:   (!) 96 F (35.6 C) (!) 95.4 F (35.2 C)  TempSrc: Oral  Rectal Rectal  SpO2: 94% 100%     Eyes: PERRL, lids and conjunctivae  normal ENMT: Mucous membranes are dry. Posterior pharynx clear of any exudate or lesions.Normal dentition.  Neck: normal, supple, no masses, no thyromegaly Respiratory: Decreased air entry bilaterally, no wheezing, no crackles. Normal respiratory effort. No accessory muscle use.  Cardiovascular: Sinus tachycardia, no murmurs / rubs / gallops. No extremity edema. 2+ pedal pulses. No carotid bruits.  Abdomen: no tenderness, no masses palpated. No hepatosplenomegaly. Bowel sounds positive.  Musculoskeletal: no clubbing / cyanosis. No joint deformity upper and lower extremities. Good ROM, no contractures. Normal muscle tone.  Skin: no rashes, lesions, ulcers. No induration Neurologic: CN 2-12 grossly intact. Sensation intact, DTR normal. Strength 5/5 in all 4.  Psychiatric: Confused, not communicating, in obvious distress    Labs on Admission: I have personally reviewed following labs and imaging studies  CBC: Recent Labs  Lab 03/18/2021 1848  WBC 23.3*  NEUTROABS 20.5*  HGB 7.8*  HCT 26.3*  MCV 79.0*  PLT 473*   Basic Metabolic Panel: Recent Labs  Lab 03/14/2021 1848  NA 133*  K 7.2*  CL 98  CO2 15*  GLUCOSE 39*  BUN 83*  CREATININE 4.32*  CALCIUM 9.2   GFR: CrCl cannot be calculated (Unknown ideal weight.). Liver Function Tests: Recent Labs  Lab 03/09/2021 1848  AST 21  ALT 16  ALKPHOS 56  BILITOT 0.3  PROT 6.1*  ALBUMIN 2.9*   No results for input(s): LIPASE,  AMYLASE in the last 168 hours. No results for input(s): AMMONIA in the last 168 hours. Coagulation Profile: No results for input(s): INR, PROTIME in the last 168 hours. Cardiac Enzymes: No results for input(s): CKTOTAL, CKMB, CKMBINDEX, TROPONINI in the last 168 hours. BNP (last 3 results) No results for input(s): PROBNP in the last 8760 hours. HbA1C: No results for input(s): HGBA1C in the last 72 hours. CBG: Recent Labs  Lab 03/17/2021 1851 03/10/2021 2005 03/08/2021 2057 03/10/2021 2122  GLUCAP 78 26* 107* 92   Lipid Profile: No results for input(s): CHOL, HDL, LDLCALC, TRIG, CHOLHDL, LDLDIRECT in the last 72 hours. Thyroid Function Tests: No results for input(s): TSH, T4TOTAL, FREET4, T3FREE, THYROIDAB in the last 72 hours. Anemia Panel: No results for input(s): VITAMINB12, FOLATE, FERRITIN, TIBC, IRON, RETICCTPCT in the last 72 hours. Urine analysis:    Component Value Date/Time   COLORURINE YELLOW 03/13/2021 2030   APPEARANCEUR CLEAR 03/20/2021 2030   LABSPEC 1.013 03/03/2021 2030   PHURINE 5.0 03/28/2021 2030   GLUCOSEU NEGATIVE 03/10/2021 2030   HGBUR NEGATIVE 03/06/2021 2030   BILIRUBINUR NEGATIVE 03/28/2021 2030   KETONESUR NEGATIVE 03/08/2021 2030   PROTEINUR 30 (A) 03/06/2021 2030   UROBILINOGEN 0.2 11/19/2009 1055   NITRITE NEGATIVE 03/21/2021 2030   LEUKOCYTESUR NEGATIVE 03/23/2021 2030   Sepsis Labs: @LABRCNTIP (procalcitonin:4,lacticidven:4) )No results found for this or any previous visit (from the past 240 hour(s)).   Radiological Exams on Admission: CT Head Wo Contrast  Result Date: 03/31/2021 CLINICAL DATA:  Altered mental status. EXAM: CT HEAD WITHOUT CONTRAST TECHNIQUE: Contiguous axial images were obtained from the base of the skull through the vertex without intravenous contrast. COMPARISON:  01/11/2020 FINDINGS: Brain: No evidence of acute infarction, hemorrhage, hydrocephalus, extra-axial collection or mass lesion/mass effect. Ventricular and sulcal  enlargement consistent with mild diffuse atrophy. 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 Patchy areas of white matter hypoattenuation are also noted consistent with moderate to advanced chronic microvascular ischemic change. Old right thalamic and left basal ganglia lacunar infarcts. Vascular: No hyperdense vessel or unexpected calcification. Skull: Normal. Negative for fracture or  focal lesion. Sinuses/Orbits: Globes and orbits are unremarkable. Sinuses are clear. Other: None. IMPRESSION: 1. No acute intracranial abnormalities. 2. Old infarcts, moderate to advanced chronic microvascular ischemic change and mild atrophy, stable compared to the prior CT. Electronically Signed   By: Amie Portland M.D.   On: 03/11/2021 20:20   DG Chest Port 1 View  Result Date: 03/21/2021 CLINICAL DATA:  Cough. EXAM: PORTABLE CHEST 1 VIEW COMPARISON:  10/16/2020. FINDINGS: Normal heart size. Loop recorder is identified in the projection of the left heart. Bony structures appear intact. Pulmonary vascular congestion. No airspace opacities. No pleural effusion or frank pulmonary edema. IMPRESSION: Pulmonary vascular congestion. Electronically Signed   By: Signa Kell M.D.   On: 03/13/2021 19:38    EKG: Independently reviewed.  Pending  Assessment/Plan Principal Problem:   Severe sepsis (HCC) Active Problems:   Type 2 diabetes mellitus with hypoglycemia without coma (HCC)   Hyperlipidemia   Multiple sclerosis (HCC)   Essential hypertension   GERD   Acute lower UTI   Acute metabolic encephalopathy   AKI (acute kidney injury) (HCC)   Hyperkalemia   Hyponatremia   Hypoglycemia     #1 severe sepsis: Urinalysis not impressive but suspected due to UTI.  Patient will be admitted.  Initiate sepsis protocol with fluid boluses using LR.  Also initiated vancomycin and cefepime for broad coverage.  She is from the skilled facility.  Follow lactic acid level.  PCCM consulted and appreciate input.  #2  hyperkalemia: Initial treatment with D50 and insulin in the ER.  Initiated Lokelma.  Follow potassium level.  #3 hypoglycemia: Probably worsened by sepsis in the setting of diabetes with insulin use.  She did have her insulin yesterday but not today.  Currently on glucose drip initially on D10 and blood sugar looking better.  Switch to D5 LR if tolerated.  #4 hyponatremia: Probably also related to significant dehydration.  Continue to monitor.  #5 suspected UTI: We will obtain urine culture and blood cultures per sepsis protocol also.  #6 history of multiple sclerosis: No treatment at this point.  Patient is septic.  Confirm home regimen when she is stable.  #7 acute metabolic encephalopathy: Secondary to acute illness.  #8 insulin-dependent diabetes: Hold all insulin and her metformin due to hypoglycemia.  Monitor capillary blood glucose closely.  #9 hyperlipidemia: Not on statin from the skilled facility.  Continue monitoring  #10 GERD: Has been on PPIs.  May resume that.   DVT prophylaxis: Lovenox Code Status: Full code Family Communication: No family at bedside.  Attempted to call daughter no answer Disposition Plan: Back to [facility] Consults called: Critical care Dr. Judeth Horn Admission status: Inpatient to stepdown unit  Severity of Illness: The appropriate patient status for this patient is INPATIENT. Inpatient status is judged to be reasonable and necessary in order to provide the required intensity of service to ensure the patient's safety. The patient's presenting symptoms, physical exam findings, and initial radiographic and laboratory data in the context of their chronic comorbidities is felt to place them at high risk for further clinical deterioration. Furthermore, it is not anticipated that the patient will be medically stable for discharge from the hospital within 2 midnights of admission. The following factors support the patient status of inpatient.   " The  patient's presenting symptoms include altered mental status. " The worrisome physical exam findings include confusion with sepsis. " The initial radiographic and laboratory data are worrisome because of severe sepsis, hyperkalemia and hypoglycemia. " The chronic  co-morbidities include diabetes and multiple sclerosis.   * I certify that at the point of admission it is my clinical judgment that the patient will require inpatient hospital care spanning beyond 2 midnights from the point of admission due to high intensity of service, high risk for further deterioration and high frequency of surveillance required.Lonia Blood MD [facility] Pager [phone]  If 7PM-7AM, please contact night-coverage [email or web address] Password [facility]  03/17/2021, 10:28 PM

## 2021-03-22 NOTE — Progress Notes (Signed)
Sepsis tracking by eLINK 

## 2021-03-22 NOTE — ED Triage Notes (Signed)
Per EMS, Pt, from [facility], presents w/ hypoglycemia and AMS.  Staff report Pt began having diarrhea today.  Pt has had CBGs of 34, 70, 77, 25, and 25.  Facility gave Glucagon x4.  Last insulin was last night.

## 2021-03-23 ENCOUNTER — Inpatient Hospital Stay (HOSPITAL_COMMUNITY): Payer: Medicare Other

## 2021-03-23 ENCOUNTER — Other Ambulatory Visit: Payer: Self-pay

## 2021-03-23 ENCOUNTER — Encounter (HOSPITAL_COMMUNITY): Payer: Self-pay

## 2021-03-23 DIAGNOSIS — N179 Acute kidney failure, unspecified: Secondary | ICD-10-CM | POA: Diagnosis not present

## 2021-03-23 DIAGNOSIS — G9341 Metabolic encephalopathy: Secondary | ICD-10-CM | POA: Diagnosis not present

## 2021-03-23 DIAGNOSIS — R6520 Severe sepsis without septic shock: Secondary | ICD-10-CM

## 2021-03-23 DIAGNOSIS — A419 Sepsis, unspecified organism: Principal | ICD-10-CM

## 2021-03-23 DIAGNOSIS — L89899 Pressure ulcer of other site, unspecified stage: Secondary | ICD-10-CM

## 2021-03-23 LAB — GLUCOSE, CAPILLARY
Glucose-Capillary: 103 mg/dL — ABNORMAL HIGH (ref 70–99)
Glucose-Capillary: 119 mg/dL — ABNORMAL HIGH (ref 70–99)
Glucose-Capillary: 121 mg/dL — ABNORMAL HIGH (ref 70–99)
Glucose-Capillary: 129 mg/dL — ABNORMAL HIGH (ref 70–99)
Glucose-Capillary: 134 mg/dL — ABNORMAL HIGH (ref 70–99)
Glucose-Capillary: 143 mg/dL — ABNORMAL HIGH (ref 70–99)
Glucose-Capillary: 149 mg/dL — ABNORMAL HIGH (ref 70–99)
Glucose-Capillary: 165 mg/dL — ABNORMAL HIGH (ref 70–99)
Glucose-Capillary: 167 mg/dL — ABNORMAL HIGH (ref 70–99)
Glucose-Capillary: 170 mg/dL — ABNORMAL HIGH (ref 70–99)
Glucose-Capillary: 173 mg/dL — ABNORMAL HIGH (ref 70–99)
Glucose-Capillary: 220 mg/dL — ABNORMAL HIGH (ref 70–99)
Glucose-Capillary: 47 mg/dL — ABNORMAL LOW (ref 70–99)
Glucose-Capillary: 50 mg/dL — ABNORMAL LOW (ref 70–99)
Glucose-Capillary: 62 mg/dL — ABNORMAL LOW (ref 70–99)
Glucose-Capillary: 71 mg/dL (ref 70–99)
Glucose-Capillary: 72 mg/dL (ref 70–99)
Glucose-Capillary: 83 mg/dL (ref 70–99)
Glucose-Capillary: 83 mg/dL (ref 70–99)
Glucose-Capillary: 84 mg/dL (ref 70–99)
Glucose-Capillary: 85 mg/dL (ref 70–99)
Glucose-Capillary: 86 mg/dL (ref 70–99)
Glucose-Capillary: 89 mg/dL (ref 70–99)
Glucose-Capillary: 91 mg/dL (ref 70–99)
Glucose-Capillary: 98 mg/dL (ref 70–99)

## 2021-03-23 LAB — COMPREHENSIVE METABOLIC PANEL
ALT: 13 U/L (ref 0–44)
AST: 22 U/L (ref 15–41)
Albumin: 2.7 g/dL — ABNORMAL LOW (ref 3.5–5.0)
Alkaline Phosphatase: 56 U/L (ref 38–126)
Anion gap: 17 — ABNORMAL HIGH (ref 5–15)
BUN: 83 mg/dL — ABNORMAL HIGH (ref 8–23)
CO2: 15 mmol/L — ABNORMAL LOW (ref 22–32)
Calcium: 8.9 mg/dL (ref 8.9–10.3)
Chloride: 97 mmol/L — ABNORMAL LOW (ref 98–111)
Creatinine, Ser: 3.97 mg/dL — ABNORMAL HIGH (ref 0.44–1.00)
GFR, Estimated: 11 mL/min — ABNORMAL LOW (ref >60)
Glucose, Bld: 94 mg/dL (ref 70–99)
Potassium: 6.6 mmol/L (ref 3.5–5.1)
Sodium: 129 mmol/L — ABNORMAL LOW (ref 135–145)
Total Bilirubin: 0.4 mg/dL (ref 0.3–1.2)
Total Protein: 5.3 g/dL — ABNORMAL LOW (ref 6.5–8.1)

## 2021-03-23 LAB — BASIC METABOLIC PANEL
Anion gap: 11 (ref 5–15)
Anion gap: 12 (ref 5–15)
Anion gap: 15 (ref 5–15)
BUN: 76 mg/dL — ABNORMAL HIGH (ref 8–23)
BUN: 74 mg/dL — ABNORMAL HIGH (ref 8–23)
BUN: 72 mg/dL — ABNORMAL HIGH (ref 8–23)
CO2: 15 mmol/L — ABNORMAL LOW (ref 22–32)
CO2: 16 mmol/L — ABNORMAL LOW (ref 22–32)
CO2: 15 mmol/L — ABNORMAL LOW (ref 22–32)
Calcium: 8 mg/dL — ABNORMAL LOW (ref 8.9–10.3)
Calcium: 8.3 mg/dL — ABNORMAL LOW (ref 8.9–10.3)
Calcium: 8.1 mg/dL — ABNORMAL LOW (ref 8.9–10.3)
Chloride: 99 mmol/L (ref 98–111)
Chloride: 100 mmol/L (ref 98–111)
Chloride: 98 mmol/L (ref 98–111)
Creatinine, Ser: 3.84 mg/dL — ABNORMAL HIGH (ref 0.44–1.00)
Creatinine, Ser: 3.78 mg/dL — ABNORMAL HIGH (ref 0.44–1.00)
Creatinine, Ser: 3.51 mg/dL — ABNORMAL HIGH (ref 0.44–1.00)
GFR, Estimated: 12 mL/min — ABNORMAL LOW (ref >60)
GFR, Estimated: 12 mL/min — ABNORMAL LOW (ref >60)
GFR, Estimated: 13 mL/min — ABNORMAL LOW (ref >60)
Glucose, Bld: 231 mg/dL — ABNORMAL HIGH (ref 70–99)
Glucose, Bld: 47 mg/dL — ABNORMAL LOW (ref 70–99)
Glucose, Bld: 148 mg/dL — ABNORMAL HIGH (ref 70–99)
Potassium: 6.5 mmol/L (ref 3.5–5.1)
Potassium: 6.2 mmol/L — ABNORMAL HIGH (ref 3.5–5.1)
Potassium: 6 mmol/L — ABNORMAL HIGH (ref 3.5–5.1)
Sodium: 125 mmol/L — ABNORMAL LOW (ref 135–145)
Sodium: 128 mmol/L — ABNORMAL LOW (ref 135–145)
Sodium: 128 mmol/L — ABNORMAL LOW (ref 135–145)

## 2021-03-23 LAB — CBC
HCT: 24.1 % — ABNORMAL LOW (ref 36.0–46.0)
Hemoglobin: 7.3 g/dL — ABNORMAL LOW (ref 12.0–15.0)
MCH: 23.6 pg — ABNORMAL LOW (ref 26.0–34.0)
MCHC: 30.3 g/dL (ref 30.0–36.0)
MCV: 78 fL — ABNORMAL LOW (ref 80.0–100.0)
Platelets: 404 10*3/uL — ABNORMAL HIGH (ref 150–400)
RBC: 3.09 MIL/uL — ABNORMAL LOW (ref 3.87–5.11)
RDW: 19.9 % — ABNORMAL HIGH (ref 11.5–15.5)
WBC: 17.4 10*3/uL — ABNORMAL HIGH (ref 4.0–10.5)
nRBC: 0 % (ref 0.0–0.2)

## 2021-03-23 LAB — PHOSPHORUS
Phosphorus: 3.7 mg/dL (ref 2.5–4.6)
Phosphorus: 3.7 mg/dL (ref 2.5–4.6)

## 2021-03-23 LAB — LACTIC ACID, PLASMA
Lactic Acid, Venous: 8.7 mmol/L (ref 0.5–1.9)
Lactic Acid, Venous: 6.9 mmol/L (ref 0.5–1.9)
Lactic Acid, Venous: 4.5 mmol/L (ref 0.5–1.9)
Lactic Acid, Venous: 4.6 mmol/L (ref 0.5–1.9)

## 2021-03-23 LAB — MAGNESIUM
Magnesium: 1.2 mg/dL — ABNORMAL LOW (ref 1.7–2.4)
Magnesium: 1.2 mg/dL — ABNORMAL LOW (ref 1.7–2.4)

## 2021-03-23 LAB — MRSA PCR SCREENING: MRSA by PCR: POSITIVE — AB

## 2021-03-23 LAB — PROTIME-INR
INR: 1.2 (ref 0.8–1.2)
Prothrombin Time: 14.9 seconds (ref 11.4–15.2)

## 2021-03-23 LAB — CORTISOL-AM, BLOOD: Cortisol - AM: 17.3 ug/dL (ref 6.7–22.6)

## 2021-03-23 LAB — AMMONIA: Ammonia: 48 umol/L — ABNORMAL HIGH (ref 9–35)

## 2021-03-23 LAB — PROCALCITONIN: Procalcitonin: 6.48 ng/mL

## 2021-03-23 MED ORDER — PROSOURCE TF PO LIQD
45.0000 mL | Freq: Two times a day (BID) | ORAL | Status: DC
  Administered 2021-03-23 – 2021-03-29 (×13): 45 mL
  Filled 2021-03-23 (×13): qty 45

## 2021-03-23 MED ORDER — DEXTROSE 50 % IV SOLN: 12.5000 g | INTRAVENOUS | Status: AC

## 2021-03-23 MED ORDER — INSULIN ASPART 100 UNIT/ML IV SOLN
5.0000 [IU] | Freq: Once | INTRAVENOUS | Status: AC
  Administered 2021-03-23: 5 [IU] via INTRAVENOUS

## 2021-03-23 MED ORDER — ONDANSETRON HCL 4 MG/2ML IJ SOLN: 4.0000 mg | Freq: Four times a day (QID) | INTRAMUSCULAR | Status: DC | PRN

## 2021-03-23 MED ORDER — DEXTROSE-NACL 5-0.9 % IV SOLN: INTRAVENOUS | Status: DC

## 2021-03-23 MED ORDER — FREE WATER
30.0000 mL | Freq: Four times a day (QID) | Status: DC
  Administered 2021-03-23 – 2021-03-29 (×24): 30 mL

## 2021-03-23 MED ORDER — MUPIROCIN 2 % EX OINT
1.0000 "application " | TOPICAL_OINTMENT | Freq: Two times a day (BID) | CUTANEOUS | Status: AC
  Administered 2021-03-23 – 2021-03-27 (×10): 1 via NASAL
  Filled 2021-03-23 (×3): qty 22

## 2021-03-23 MED ORDER — DEXTROSE 50 % IV SOLN
1.0000 | Freq: Once | INTRAVENOUS | Status: AC
  Administered 2021-03-23: 50 mL via INTRAVENOUS
  Filled 2021-03-23: qty 50

## 2021-03-23 MED ORDER — ORAL CARE MOUTH RINSE
15.0000 mL | Freq: Two times a day (BID) | OROMUCOSAL | Status: DC
  Administered 2021-03-23 – 2021-03-30 (×15): 15 mL via OROMUCOSAL

## 2021-03-23 MED ORDER — INSULIN ASPART 100 UNIT/ML IV SOLN
10.0000 [IU] | Freq: Once | INTRAVENOUS | Status: AC
  Administered 2021-03-23: 10 [IU] via INTRAVENOUS

## 2021-03-23 MED ORDER — ONDANSETRON HCL 4 MG PO TABS: 4.0000 mg | ORAL_TABLET | Freq: Four times a day (QID) | ORAL | Status: DC | PRN

## 2021-03-23 MED ORDER — SODIUM ZIRCONIUM CYCLOSILICATE 10 G PO PACK
10.0000 g | PACK | Freq: Three times a day (TID) | ORAL | Status: DC
  Administered 2021-03-23 (×2): 10 g via ORAL
  Filled 2021-03-23 (×2): qty 1

## 2021-03-23 MED ORDER — DEXTROSE 50 % IV SOLN
INTRAVENOUS | Status: AC
  Filled 2021-03-23: qty 50

## 2021-03-23 MED ORDER — SODIUM ZIRCONIUM CYCLOSILICATE 10 G PO PACK: 10.0000 g | PACK | Freq: Three times a day (TID) | ORAL | Status: DC

## 2021-03-23 MED ORDER — CHLORHEXIDINE GLUCONATE 0.12 % MT SOLN
15.0000 mL | Freq: Two times a day (BID) | OROMUCOSAL | Status: DC
  Administered 2021-03-23 – 2021-03-30 (×15): 15 mL via OROMUCOSAL
  Filled 2021-03-23 (×10): qty 15

## 2021-03-23 MED ORDER — DEXTROSE 50 % IV SOLN
INTRAVENOUS | Status: AC
  Administered 2021-03-23: 25 g via INTRAVENOUS
  Filled 2021-03-23: qty 50

## 2021-03-23 MED ORDER — SODIUM CHLORIDE 0.9 % IV BOLUS
1000.0000 mL | Freq: Once | INTRAVENOUS | Status: AC
  Administered 2021-03-23: 1000 mL via INTRAVENOUS

## 2021-03-23 MED ORDER — ORAL CARE MOUTH RINSE: 15.0000 mL | Freq: Two times a day (BID) | OROMUCOSAL | Status: DC

## 2021-03-23 MED ORDER — DEXTROSE 50 % IV SOLN: 25.0000 g | INTRAVENOUS | Status: AC

## 2021-03-23 MED ORDER — SODIUM ZIRCONIUM CYCLOSILICATE 10 G PO PACK: 10.0000 g | PACK | Freq: Every day | ORAL | Status: DC

## 2021-03-23 MED ORDER — SODIUM CHLORIDE 0.9 % IV SOLN: 1.0000 g | INTRAVENOUS | Status: DC

## 2021-03-23 MED ORDER — ACETAMINOPHEN 650 MG RE SUPP: 650.0000 mg | Freq: Four times a day (QID) | RECTAL | Status: DC | PRN

## 2021-03-23 MED ORDER — GLYCERIN (LAXATIVE) 2 G RE SUPP
1.0000 | Freq: Once | RECTAL | Status: AC
  Administered 2021-03-23: 1 via RECTAL
  Filled 2021-03-23: qty 1

## 2021-03-23 MED ORDER — DEXTROSE IN LACTATED RINGERS 5 % IV SOLN: INTRAVENOUS | Status: DC

## 2021-03-23 MED ORDER — SODIUM ZIRCONIUM CYCLOSILICATE 10 G PO PACK
10.0000 g | PACK | Freq: Three times a day (TID) | ORAL | Status: DC
  Administered 2021-03-23 – 2021-03-24 (×2): 10 g
  Filled 2021-03-23 (×2): qty 1

## 2021-03-23 MED ORDER — SODIUM CHLORIDE 0.9 % IV SOLN: INTRAVENOUS | Status: DC

## 2021-03-23 MED ORDER — OSMOLITE 1.5 CAL PO LIQD
1000.0000 mL | ORAL | Status: DC
  Administered 2021-03-23 – 2021-03-29 (×6): 1000 mL
  Filled 2021-03-23 (×9): qty 1000

## 2021-03-23 MED ORDER — ACETAMINOPHEN 325 MG PO TABS
650.0000 mg | ORAL_TABLET | Freq: Four times a day (QID) | ORAL | Status: DC | PRN
  Administered 2021-03-24: 650 mg via ORAL
  Filled 2021-03-23 (×2): qty 2

## 2021-03-23 MED ORDER — SODIUM CHLORIDE 0.9 % IV SOLN
2.0000 g | INTRAVENOUS | Status: DC
  Administered 2021-03-23: 2 g via INTRAVENOUS
  Filled 2021-03-23: qty 2

## 2021-03-23 MED ORDER — HEPARIN SODIUM (PORCINE) 5000 UNIT/ML IJ SOLN
5000.0000 [IU] | Freq: Three times a day (TID) | INTRAMUSCULAR | Status: DC
  Administered 2021-03-23 – 2021-03-29 (×19): 5000 [IU] via SUBCUTANEOUS
  Filled 2021-03-23 (×18): qty 1

## 2021-03-23 MED ORDER — CHLORHEXIDINE GLUCONATE CLOTH 2 % EX PADS
6.0000 | MEDICATED_PAD | Freq: Every day | CUTANEOUS | Status: DC
  Administered 2021-03-23 – 2021-03-30 (×8): 6 via TOPICAL

## 2021-03-23 MED ORDER — DEXTROSE 10 % IV SOLN: INTRAVENOUS | Status: DC

## 2021-03-23 MED ORDER — SODIUM CHLORIDE 0.9 % IV SOLN: 2.0000 g | Freq: Once | INTRAVENOUS | Status: DC

## 2021-03-23 MED ORDER — VITAL HIGH PROTEIN PO LIQD: 1000.0000 mL | ORAL | Status: DC

## 2021-03-23 NOTE — Consult Note (Signed)
WOC Nurse Consult Note: Reason for Consult: Consult requested for left leg.  Pt has a chronic full thickness wound which has been treated with hydrofiber prior to admission, since that dressing is in place when wound assessment was performed. 4X3X.3cm, yellow inner wound, mod amt tan drainage, no odor Left lower posterior calf with drak purple red deep tissue pressure injury; 2X2cm Pressure Injury POA: Yes Dressing procedure/placement/frequency: Topical treatment orders provided for bedside nurses to perform as follows: Foam dressing ti left lower leg DTPI: Change foam dressing Q 3 days or PRN soiling.)  Apply a piece of Aquacel [place] [phone]) to left posterior leg wound Q day, then cover with foam dressing. Moisten with NS Q day to remove previous dressing. Change foam dressing Q 3 days or PRN soiling.)  Please re-consult if further assistance is needed.  Thank-you,  Cammie Mcgee MSN, RN, CWOCN, [place], CNS [phone]

## 2021-03-23 NOTE — Progress Notes (Addendum)
eLink Physician-Brief Progress Note Patient Name: Janice Little DOB: 1945/12/16 MRN: [id number]   Date of Service  03/23/2021  HPI/Events of Note  Hyperglycemia - Blood glucose = 220.  eICU Interventions  PlanL Decrease D5 NaCl IV infusion from 125 mL/hour to 75 mL/hour.     Intervention Category Major Interventions: Hyperglycemia - active titration of insulin therapy  Lenell Antu 03/23/2021, 11:32 PM

## 2021-03-23 NOTE — Progress Notes (Signed)
NAME:  Janice Little, MRN:  [id number], DOB:  January 11, 1946, LOS: 1 ADMISSION DATE:  03/17/2021, CONSULTATION DATE:  03/28/2021 REFERRING MD:  ED, CHIEF COMPLAINT:  hypoglycemia   History of Present Illness:  80 year with baseline severe cognitive impairment lives at a facility presents with hypoglycemia. History per EMR. Can not obtain history via patient due to patient factors, encephalopathy.  Reportedly more lethargic last few days. Diarrhea began today. Noted to have low Bgs. Receives long acting IV insulin, most recently 6/19 PM. In ED, CXR clear on my review and interpretation. Satting 100% on baseline 2L [place]. Labs with leukocytosis, anemia a bit worse than last check during prior admission. K 7.2, Cr > 4. Bgs low. UA with negative LE, nitrites but with mild pyuria.  Admitted 10/2020 with covid. D/c summary reviewed. On 2L [place] since.   Pertinent  Medical History  Cognitive impairment, chronic respiratory failure (2L [place]), DM, HTN  Significant Hospital Events: Including procedures, antibiotic start and stop dates in addition to other pertinent events   6/20 admitted with ARF, hypoglycemia, hyperkalemia  Interim History / Subjective:  Some improvement acidosis, lactate, potassium on morning labs I/O+ 3.6 L total No pressors 2 L/min nasal cannula  Objective   Blood pressure (!) 96/58, pulse 84, temperature 97.7 F (36.5 C), temperature source Axillary, resp. rate 19, weight 71.6 kg, SpO2 100 %.        Intake/Output Summary (Last 24 hours) at 03/23/2021 4097 Last data filed at 03/23/2021 0400 Gross per 24 hour  Intake 4434.66 ml  Output 800 ml  Net 3634.66 ml   Filed Weights   03/11/2021 2301  Weight: 71.6 kg    Examination: General: Acute and chronic ill-appearing woman, laying in bed HENT: Somewhat dry, pupils equal, no stridor Lungs: Clear bilaterally, somewhat decreased at both bases, mild tachypnea but no respiratory distress Cardiovascular: Distant, regular, no  murmur Abdomen: Distended, positive bowel sounds Extremities: Warm, some left upper extremity edema Neuro: Will open eyes to voice, moves her extremities, unclear whether this is purposeful or random.  She did not follow commands   Labs/imaging that I havepersonally reviewed  (right click and "Reselect all SmartList Selections" daily)   All studies and labs reviewed 6/21 Note slight improvement potassium, lactic acid, serum creatinine  Resolved Hospital Problem list   N/a  Assessment & Plan:  Hyperkalemia due to acute renal failure: Suspect due to hypovolemia in setting of diarrhea, poor PO intake. 800 cc UOP recorded in ED is encouraging. S/p shifting of K  in ED. -Lokelma as ordered, ensure that this can be given, question NG tube -Aggressive IV fluid resuscitation -Follow EKG for any changes in T waves, intervals -Stat labs now, follow every 4 hours until normalization  Hypoglycemia: Suspect related to insulin accumulation in the setting of renal failure: -Insulin on hold -D10W infusion -Follow CBG  Lactic Acidemia: Suspect largely related to hypovolemia. Possibly related to sepsis. CXR clear. UA w/o LE or nitrites but mild pyuria. -Repeat lactic acid now and follow for clearance -Received empiric ceftriaxone to cover possible UTI, septic source.  Antibiotics apparently broadened to cefepime, Flagyl, vancomycin.  Unclear what we are covering here.  Blood and urine cultures are pending.  MRSA swab was positive.  Narrow antibiotics as soon as feasible  Acute renal failure: felt most likely due to hypovolemia. Possible component of sepsis -Aggressive IV fluid resuscitation -Repeat BMP ordered and pending  Cognitive impairment: "sparse speech" noted last admission, was DNI last admission. Not  interactive at time of evaluation, BG 50 which is confounding.  -Question whether she may benefit from evaluation  Toxic metabolic encephalopathy: Hypoglycemia, uremia, possible sepsis,  baseline cognitive impairment. -Treating reversible causes as above  Best Practice (right click and "Reselect all SmartList Selections" daily)   Per primary Disposition: admit to progressive care      Labs   CBC: Recent Labs  Lab 03/28/2021 1848 03/23/21 0312  WBC 23.3* 17.4*  NEUTROABS 20.5*  --   HGB 7.8* 7.3*  HCT 26.3* 24.1*  MCV 79.0* 78.0*  PLT 473* 404*    Basic Metabolic Panel: Recent Labs  Lab 03/17/2021 1848 03/23/21 0312  NA 133* 129*  K 7.2* 6.6*  CL 98 97*  CO2 15* 15*  GLUCOSE 39* 94  BUN 83* 83*  CREATININE 4.32* 3.97*  CALCIUM 9.2 8.9   GFR: Estimated Creatinine Clearance: 12 mL/min (A) (by C-G formula based on SCr of 3.97 mg/dL (H)). Recent Labs  Lab 03/16/2021 1848 03/23/21 0100 03/23/21 0312 03/23/21 0417  PROCALCITON  --   --  6.48  --   WBC 23.3*  --  17.4*  --   LATICACIDVEN 7.9* 8.7*  --  6.9*    Liver Function Tests: Recent Labs  Lab 03/26/2021 1848 03/23/21 0312  AST 21 22  ALT 16 13  ALKPHOS 56 56  BILITOT 0.3 0.4  PROT 6.1* 5.3*  ALBUMIN 2.9* 2.7*   No results for input(s): LIPASE, AMYLASE in the last 168 hours. Recent Labs  Lab 03/23/21 0421  AMMONIA 48*    ABG No results found for: PHART, PCO2ART, PO2ART, HCO3, TCO2, ACIDBASEDEF, O2SAT   Coagulation Profile: Recent Labs  Lab 03/23/21 0312  INR 1.2    Cardiac Enzymes: No results for input(s): CKTOTAL, CKMB, CKMBINDEX, TROPONINI in the last 168 hours.  HbA1C: Hgb A1c MFr Bld  Date/Time Value Ref Range Status  03/01/2019 02:47 AM 5.6 4.8 - 5.6 % Final    Comment:    (NOTE) Pre diabetes:          5.7%-6.4% Diabetes:              >6.4% Glycemic control for   <7.0% adults with diabetes     CBG: Recent Labs  Lab 03/23/21 0301 03/23/21 0409 03/23/21 0509 03/23/21 0621 03/23/21 0744  GLUCAP 89 91 85 84 83     Critical care time: 31 minutes      Levy Pupa, MD, PhD 03/23/2021, 7:53 AM [facility] [phone] or if  no answer before 7:00PM call [phone] For any issues after 7:00PM please call eLink [phone]

## 2021-03-23 NOTE — Consult Note (Signed)
Janice Little Admit Date: 03-30-2021 03/23/2021 Janice Little Requesting Physician:  Ashok Pall  Reason for Consult:  AKI, Hyperkalemia, Acidosis HPI:  75F admit 6/20 after presenting to the ED with progressive encephalopathy and hyperglycemia.  PMH Incudes: MS with cognitive impairment, unclear baseline at the current time, resides in [facility] DM2 on metformin Hypertension on lisinopril, hydralazine, amlodipine GERD History of hyperparathyroidism COVID-19 infection January 2022 History of COPD  Patient's ED work-up identified AKI from a normal baseline GFR.  Presenting creatinine of 4.3.  Further she had hyperkalemia with presenting potassium 7.2.  EKG at presentation with no peaked T waves, normal QRS and PR interval duration.  She was treated with parenteral calcium, insulin/dextrose, Lokelma;  ACE inhibitor has been held.  Presenting serum sodium of 133, now 125.  Serum bicarbonate is 15 with an anion gap of 11.  Urinalysis with some pyuria, no hematuria, trace proteinuria.  No dedicated or incidental renal imaging since presentation.  Placed on vancomycin, cefepime, treating for potential sepsis with UTI.  Blood pressures are stable.  CCM and TRH are both following.  1.3 L urine output since presentation.  She is not interactive, she is nonverbal, she opens eyes spontaneously and randomly.   Creatinine, Ser (mg/dL)  Date Value  59/16/3846 3.78 (H)  03/23/2021 3.84 (H)  03/23/2021 3.97 (H)  03/30/2021 4.32 (H)  10/22/2020 0.53  10/21/2020 0.44  10/20/2020 0.57  10/19/2020 0.70  10/18/2020 0.76  10/17/2020 0.64  ] I/Os: I/O last 3 completed shifts: In: 4434.7 [I.V.:1261.5; IV Piggyback:3173.2] Out: 800 [Urine:800]   ROS Unable to complete review of systems patient's  PMH  Past Medical History:  Diagnosis Date   Chronic kidney disease (CKD), stage II (mild)    Chronic pain    Chronic respiratory failure (HCC)    DM type 2 (diabetes mellitus, type 2)  (HCC)    Dysphagia, oropharyngeal phase    Fatty liver    per CT   GERD (gastroesophageal reflux disease)    History of hepatitis as a child    age 8 (jaundice)   HTN (hypertension)    Hyperlipidemia    Monoplegia of upper limb (HCC)    MS (multiple sclerosis) (HCC)    Obesity    Osteopenia    Primary hyperparathyroidism (HCC)    s/p surgery removal of parathyroid (Dr. Sharl Ma, Dr. Gerrit Friends)   Vitamin D deficiency    PSH  Past Surgical History:  Procedure Laterality Date   ANTERIOR CRUCIATE LIGAMENT REPAIR Left    LOOP RECORDER INSERTION N/A 03/05/2019   Procedure: LOOP RECORDER INSERTION;  Surgeon: Marinus Maw, MD;  Location: [facility];  Service: Cardiovascular;  Laterality: N/A;   VESICOVAGINAL FISTULA CLOSURE W/ TAH     FH  Family History  Problem Relation Age of Onset   Mental illness Mother    COPD Father    Lung cancer Father    SH  reports that she has never smoked. She has never used smokeless tobacco. She reports that she does not drink alcohol and does not use drugs. Allergies No Known Allergies Home medications Prior to Admission medications   Medication Sig Start Date End Date Taking? Authorizing Provider  acetaminophen (TYLENOL) 325 MG tablet Take 2 tablets (650 mg total) by mouth every 4 (four) hours as needed for mild pain (or temp > 37.5 C (99.5 F)). 03/05/19   Vassie Loll, MD  amLODipine (NORVASC) 5 MG tablet Take 5 mg by mouth daily. 07/24/19  [provider]  amoxicillin-clavulanate (AUGMENTIN) 875-125 MG tablet Take 1 tablet by mouth every 12 (twelve) hours. 10/23/20   Tyrone Nine, MD  aspirin 325 MG tablet Take 1 tablet (325 mg total) by mouth daily. 03/06/19   Vassie Loll, MD  Cholecalciferol (VITAMIN D) 50 MCG (2000 UT) CAPS Take 2,000 Units by mouth daily.    [provider]  DULoxetine (CYMBALTA) 60 MG capsule Take 60 mg by mouth daily.    [provider]  gabapentin (NEURONTIN) 100 MG capsule Take 100 mg by  mouth 3 (three) times daily.    [provider]  hydrALAZINE (APRESOLINE) 25 MG tablet Take 3 tablets (75 mg total) by mouth every 8 (eight) hours. 03/05/19   Vassie Loll, MD  Infant Care Products [facility]) OINT Apply 1 application topically daily. Apply to buttocks.    [provider]  insulin glargine (LANTUS) 100 UNIT/ML injection Inject 34 Units into the skin at bedtime.    [provider]  insulin lispro (HUMALOG) 100 UNIT/ML injection Inject 2-11 Units into the skin 3 (three) times daily before meals. Sliding scale: 101-150=2 U 151-200=3 U 201-250=5 U 251-300=7 U 301-350=9 U >350=11 U  Call MD for BS>400 or <60    [provider]  lisinopril (ZESTRIL) 40 MG tablet Take 40 mg by mouth daily.    [provider]  Melatonin 3 MG TABS Take 3 mg by mouth at bedtime.     [provider]  metFORMIN (GLUCOPHAGE-XR) 500 MG 24 hr tablet Take 1 tablet (500 mg total) by mouth 2 (two) times a day. 03/05/19   Vassie Loll, MD  Multiple Vitamins-Minerals (CEROVITE SENIOR) TABS Take 1 tablet by mouth daily.    [provider]  omeprazole (PRILOSEC OTC) 20 MG tablet Take 20 mg by mouth daily.    [provider]  polyethylene glycol powder (GLYCOLAX/MIRALAX) 17 GM/SCOOP powder Take 17 g by mouth daily as needed for mild constipation.    [provider]    Current Medications Scheduled Meds:  chlorhexidine  15 mL Mouth Rinse BID   Chlorhexidine Gluconate Cloth  6 each Topical Daily   feeding supplement (PROSource TF)  45 mL Per Tube BID   free water  30 mL Per Tube Q6H   heparin  5,000 Units Subcutaneous Q8H   mouth rinse  15 mL Mouth Rinse BID   mupirocin ointment  1 application Nasal BID   sodium zirconium cyclosilicate  10 g Oral TID   Continuous Infusions:  ceFEPime (MAXIPIME) IV     dextrose 5% lactated ringers     feeding supplement (OSMOLITE 1.5 CAL)     PRN Meds:.acetaminophen **OR** acetaminophen,  ondansetron **OR** ondansetron (ZOFRAN) IV  CBC Recent Labs  Lab 03-26-21 1848 03/23/21 0312  WBC 23.3* 17.4*  NEUTROABS 20.5*  --   HGB 7.8* 7.3*  HCT 26.3* 24.1*  MCV 79.0* 78.0*  PLT 473* 404*   Basic Metabolic Panel Recent Labs  Lab 03-26-21 1848 03/23/21 0312 03/23/21 0815 03/23/21 1156  NA 133* 129* 125* 128*  K 7.2* 6.6* 6.5* 6.2*  CL 98 97* 99 100  CO2 15* 15* 15* 16*  GLUCOSE 39* 94 231* 47*  BUN 83* 83* 76* 74*  CREATININE 4.32* 3.97* 3.84* 3.78*  CALCIUM 9.2 8.9 8.0* 8.3*  PHOS  --   --   --  3.7    Physical Exam  Blood pressure (!) 177/137, pulse 93, temperature 98.2 F (36.8 C), resp. rate (!) 21,  weight 71.6 kg, SpO2 100 %. GEN: Chronically ill-appearing, NAD, not interactive, eyes open randomly ENT: NCAT, mild temporal wasting EYES: EOMI CV: Regular, normal S1 and S2 PULM: Clear bilaterally, normal work of breathing ABD: Soft, nontender, no masses or HSM SKIN: No rashes or lesions EXT: No peripheral edema  Assessment 34M PMH including MS and chronic impaired cognition at SNF, Hypertension on ACE inhibitor presenting with AKI, hyperkalemia, progressive hyponatremia, normal anion gap metabolic acidosis.  Most likely this patient had AKI initially from hypovolemia while using lisinopril.  Hyperkalemia as a consequence of the AKI and use of lisinopril.  Hyponatremia probably from excess free water intake to correct hyperglycemia.  Acidosis is most readily explained by her AKI.  Oliguric AKI: likely hypovolemia/ACEi +/- transition to ATN.   Hyperkalemia, severe, slowly improving.  EKG without changes at presentation.  Likely related to 1, use of ACE Hypertension, on 3 agents, including ACE inhibitor, held at presentation Progressive hyponatremia likely from excessive free water intake to try to correct hyperglycemia NAG metabolic acidosis likely related to #1 Sepsis, likely UTI source, antibiotics per TRH/CCM, cultures pending MS, impaired cognition  chronically, at SNF History of surgical correction of primary hyperparathyroidism, calcium normal to low DM2, on metformin as outpatient Hypoglycemia, likely combination of relative insulin access and low GFR  Plan No indication for dialysis at the current time, with supportive therapy this should correct Continue Lokelma 10 g 3 times daily until serum potassium is less than 5.5 Discontinue D10 drip, starting tube feeds, changed to D5 LR at 75 mils per hour Continue serial BMPs Check renal ultrasound Will cont to follow   Janice Miss, MD  03/23/2021, 1:10 PM

## 2021-03-23 NOTE — TOC Initial Note (Signed)
Transition of Care [facility]) - Initial/Assessment Note    Patient Details  Name: Janice Little MRN: [id number] Date of Birth: 14-Nov-1945  [facility] CM/SW Contact:    Golda Acre, RN Phone Number: 03/23/2021, 7:51 AM  Clinical Narrative:                 75 y.o. female with medical history significant of diabetes, chronic kidney disease stage III, chronic pain syndrome, chronic respiratory failure secondary to COPD, multiple sclerosis, GERD, essential hypertension and primary hyperparathyroidism s/p surgery who presents to the ER with hypoglycemia by EMS.  Patient apparently started having diarrhea today at [facility].  Her blood glucose was noted to be 34 then 7077 and 25.  She was given glucagon x4.  She has not received any insulin today.  When patient arrived the ER she appeared drowsy unable to give any history.  Her initial blood sugar was 46.  Was given D10 and blood sugar went to 130.  She is so lethargic this not much history was obtained.  Patient was noted to be in acute kidney failure with creatinine more than 4.  It was previously normal.  Also hyperkalemia potassium more than 7 as well as lactic acidosis.  Suspected pyuria and severe sepsis.  Her blood pressure is however stable.  She is hypoxic but has chronically been on 2 L of oxygen.  At this point she is being admitted as a case of severe sepsis with multiple electrolyte abnormalities.  Critical care consulted and will follow with [place].   ED Course: Temperature is 95.4 initial blood pressure 102/51 with pulse 102 respiratory 59 oxygen sat 94% on 2 L.  Her white count is 23.3 hemoglobin 7.8 and platelets 473.  Sodium 133 potassium 7.2 chloride 98 CO2 15 glucose 39 BUN 83 creatinine 4.32 calcium 9.2 with a gap of 20 alkaline phosphatase 56 albumin 2.9 and BNP of 210.  Lactic acid 7.9 initially rose to 8.7.  Initial glucose of 39.  COVID-19 screen is negative.  Urinalysis showed clear urine.  Many bacteria.  WBC  11-20.  Nitrite is negative and leukocyte negative.  Chest x-ray showed pulmonary vascular congestion.  Head CT without contrast was negative.  Patient being admitted with case of severe sepsis, hyperkalemia, hypoglycemia and lactic acidosis.  TOC PLAN: patient is from [facility] plan is to return once diarrhea is controlled. Expected Discharge Plan: Skilled Nursing Facility Barriers to Discharge: Continued Medical Work up   Patient Goals and CMS Choice Patient states their goals for this hospitalization and ongoing recovery are:: patient is rom [facility]      Expected Discharge Plan and Services Expected Discharge Plan: Skilled Nursing Facility       Living arrangements for the past 2 months: Skilled Nursing Facility                                      Prior Living Arrangements/Services Living arrangements for the past 2 months: Skilled Nursing Facility Lives with:: Facility Resident Patient language and need for interpreter reviewed:: Yes              Criminal Activity/Legal Involvement Pertinent to Current Situation/Hospitalization: No - Comment as needed  Activities of Daily Living Home Assistive Devices/Equipment: Other (Comment) (unknown at this time) ADL Screening (condition at time of admission) Patient's cognitive ability adequate to safely complete daily activities?: No Is the patient  deaf or have difficulty hearing?: No Does the patient have difficulty seeing, even when wearing glasses/contacts?: No Does the patient have difficulty concentrating, remembering, or making decisions?: Yes Patient able to express need for assistance with ADLs?: No Does the patient have difficulty dressing or bathing?: Yes Independently performs ADLs?: No Does the patient have difficulty walking or climbing stairs?: Yes Weakness of Legs: Both Weakness of Arms/Hands: Both  Permission Sought/Granted                  Emotional Assessment Appearance::  Appears stated age Attitude/Demeanor/Rapport: Unable to Assess Affect (typically observed): Unable to Assess Orientation: : Fluctuating Orientation (Suspected and/or reported Sundowners) Alcohol / Substance Use: Tobacco Use Psych Involvement: No (comment)  Admission diagnosis:  Severe sepsis (HCC) [A41.9, R65.20] Patient Active Problem List   Diagnosis Date Noted   Severe sepsis (HCC) 25-Mar-2021   AKI (acute kidney injury) (HCC) 03/25/21   Hyperkalemia 03-25-2021   Hyponatremia 2021/03/25   Hypoglycemia 03/25/2021   Acute cholecystitis 10/20/2020   Acute hypoxemic respiratory failure due to COVID-19 (HCC) 10/16/2020   Acute metabolic encephalopathy 10/16/2020   Gross hematuria    Acute sepsis (HCC)    Sepsis secondary to UTI (HCC)    Diarrhea    Elevated sed rate 08/12/2019   Elevated C-reactive protein (CRP) 08/12/2019   Left-sided neglect 08/01/2019   Myalgia 08/01/2019   Pressure injury of skin 03/04/2019   Acute lower UTI    Stroke (HCC) 03/02/2019   Acute CVA (cerebrovascular accident) (HCC) 03/01/2019   Hypertensive urgency 12/19/2018   History of myelitis 12/26/2017   History of optic neuritis 04/02/2015   Diplopia 04/02/2015   Urinary incontinence 04/02/2015   Other fatigue 04/02/2015   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   Numbness 04/02/2015   Ataxic gait 04/02/2015   Essential tremor 04/02/2015   PRIMARY HYPERPARATHYROIDISM 02/01/2008   OSTEOPOROSIS 02/01/2008   Type 2 diabetes mellitus with hypoglycemia without coma (HCC) 01/31/2008   Hyperlipidemia 01/31/2008   Multiple sclerosis (HCC) 01/31/2008   Optic neuritis 01/31/2008   Essential hypertension 01/31/2008   GERD 01/31/2008   PCP:  Juluis Rainier, MD Pharmacy:  No Pharmacies Listed    Social Determinants of Health (SDOH) Interventions    Readmission Risk Interventions Readmission Risk Prevention Plan 09/18/2019 12/24/2018  Post Dischage Appt - Complete   Medication Screening - Complete  Transportation Screening Complete Complete  PCP or Specialist Appt within 5-7 Days Complete -  Home Care Screening Complete -  Medication Review (RN CM) Complete -  Some recent data might be hidden

## 2021-03-23 NOTE — Progress Notes (Addendum)
PROGRESS NOTE    Janice Little  WUJ:[id number] DOB: 10/11/1945 DOA: 08/31/21 PCP: Juluis RainierBarnes, Elizabeth, MD     Brief Narrative: Janice NimsCarol T Handel is a 75 y.o. female with medical history significant of diabetes, chronic kidney disease stage III, chronic pain syndrome, chronic respiratory failure secondary to COPD, multiple sclerosis, GERD, essential hypertension and primary hyperparathyroidism s/p surgery who presents to the ER with hypoglycemia by EMS.  Patient apparently started having diarrhea today at [facility].  Her blood glucose was noted to be 34 then 7077 and 25.  She was given glucagon x4.  She has not received any insulin today.  When patient arrived the ER she appeared drowsy unable to give any history.  Her initial blood sugar was 46.  Was given D10 and blood sugar went to 130.  She is so lethargic this not much history was obtained.  Patient was noted to be in acute kidney failure with creatinine more than 4.  It was previously normal.  Also hyperkalemia potassium more than 7 as well as lactic acidosis.  Suspected pyuria and severe sepsis.  Her blood pressure is however stable.  She is hypoxic but has chronically been on 2 L of oxygen.  At this point she is being admitted as a case of severe sepsis with multiple electrolyte abnormalities.  Critical care consulted and will follow with us.   Assessment & Plan:   Principal Problem:   Severe sepsis (HCC) Active Problems:   Type 2 diabetes mellitus with hypoglycemia without coma (HCC)   Hyperlipidemia   Multiple sclerosis (HCC)   Essential hypertension   GERD   Acute lower UTI   Acute metabolic encephalopathy   AKI (acute kidney injury) (HCC)   Hyperkalemia   Hyponatremia   Hypoglycemia  # severe sepsis Presumed to be 2/2 uti. Is retaining, pvr in the 500s. Lactate remains elevated.  - f/u urine and blood cultures - insert foley - continue vanc and cefepime (mrsa screen positive) - f/u CT c/a/p - fluids @ 200 -  pccm following, appreciate recs when available  # hyperkalemia: Initial treatment with D50 and insulin in the ER. Received calcium gluconate. Is on lokelma. K improved to 6.6 this morning. - continue IVF and lokelma - will re-order insulin with dextrose - nephrology consulted  # Question stercoral colitis Seen on CT - will order glycerine suppository x1   # hypoglycemia: Probably worsened by sepsis in the setting of diabetes with insulin use.  She did have her insulin yesterday but not today.  normal glucose this morning - glucose w/ fluids   # hyponatremia: Probably also related to significant dehydration.  Continue to monitor.   # history of multiple sclerosis # History CVA: No treatment at this point.  Patient is septic.  Confirm home regimen when she is stable.   # acute metabolic encephalopathy: Secondary to acute illness.   # insulin-dependent diabetes: Hold all insulin and her metformin due to hypoglycemia.  Monitor capillary blood glucose closely.   # htn Holding home meds while septic and hypotensive   # GERD: Has been on PPIs.  May resume that.  # chronic pain - resume home gabapentin and cymbalta when tolerating pi  # left leg decubitus ulcer - local wound care  DVT prophylaxis: heparin Code Status: partial (yes to cpr, no to ventilator) Family Communication: sister updated telephonically 6/21  Level of care: Stepdown Status is: Inpatient  Remains inpatient appropriate because:Inpatient level of care appropriate due to severity of illness  Dispo: The patient is from: SNF              Anticipated d/c is to: SNF              Patient currently is not medically stable to d/c.   Difficult to place patient No     Consultants:  Pccm, nephrology  Procedures: none  Antimicrobials:  Vanc/cefepime    Subjective: Not arousable  Objective: Vitals:   03/23/21 0600 03/23/21 0700 03/23/21 0730 03/23/21 0800  BP: (!) 96/58 90/70    Pulse: 84 87  85   Resp: 19 16  14   Temp:   99 F (37.2 C)   TempSrc:   Axillary   SpO2: 100% 100%  99%  Weight:        Intake/Output Summary (Last 24 hours) at 03/23/2021 0901 Last data filed at 03/23/2021 0400 Gross per 24 hour  Intake 4434.66 ml  Output 800 ml  Net 3634.66 ml   Filed Weights   03/09/2021 2301  Weight: 71.6 kg    Examination:  General exam: chronically ill appearing Respiratory system: scattered rales at bases Cardiovascular system: S1 & S2 heard, RRR. No JVD, murmurs, rubs, gallops or clicks. No pedal edema. Gastrointestinal system: Abdomen is nondistended, soft and nontender. No organomegaly or masses felt. Normal bowel sounds heard. Central nervous system: asleep. Moves all 4 extremities Extremities: edema of arms.  Skin: scattered bruising right arm. Ulcer left posterior calf Psychiatry: unable to assess    Data Reviewed: I have personally reviewed following labs and imaging studies  CBC: Recent Labs  Lab 03/26/2021 1848 03/23/21 0312  WBC 23.3* 17.4*  NEUTROABS 20.5*  --   HGB 7.8* 7.3*  HCT 26.3* 24.1*  MCV 79.0* 78.0*  PLT 473* 404*   Basic Metabolic Panel: Recent Labs  Lab 03/03/2021 1848 03/23/21 0312  NA 133* 129*  K 7.2* 6.6*  CL 98 97*  CO2 15* 15*  GLUCOSE 39* 94  BUN 83* 83*  CREATININE 4.32* 3.97*  CALCIUM 9.2 8.9   GFR: Estimated Creatinine Clearance: 12 mL/min (A) (by C-G formula based on SCr of 3.97 mg/dL (H)). Liver Function Tests: Recent Labs  Lab 03/11/2021 1848 03/23/21 0312  AST 21 22  ALT 16 13  ALKPHOS 56 56  BILITOT 0.3 0.4  PROT 6.1* 5.3*  ALBUMIN 2.9* 2.7*   No results for input(s): LIPASE, AMYLASE in the last 168 hours. Recent Labs  Lab 03/23/21 0421  AMMONIA 48*   Coagulation Profile: Recent Labs  Lab 03/23/21 0312  INR 1.2   Cardiac Enzymes: No results for input(s): CKTOTAL, CKMB, CKMBINDEX, TROPONINI in the last 168 hours. BNP (last 3 results) No results for input(s): PROBNP in the last 8760  hours. HbA1C: No results for input(s): HGBA1C in the last 72 hours. CBG: Recent Labs  Lab 03/23/21 0301 03/23/21 0409 03/23/21 0509 03/23/21 0621 03/23/21 0744  GLUCAP 89 91 85 84 83   Lipid Profile: No results for input(s): CHOL, HDL, LDLCALC, TRIG, CHOLHDL, LDLDIRECT in the last 72 hours. Thyroid Function Tests: No results for input(s): TSH, T4TOTAL, FREET4, T3FREE, THYROIDAB in the last 72 hours. Anemia Panel: No results for input(s): VITAMINB12, FOLATE, FERRITIN, TIBC, IRON, RETICCTPCT in the last 72 hours. Urine analysis:    Component Value Date/Time   COLORURINE YELLOW 03/04/2021 2030   APPEARANCEUR CLEAR 03/09/2021 2030   LABSPEC 1.013 03/15/2021 2030   PHURINE 5.0 03/25/2021 2030   GLUCOSEU NEGATIVE 03/11/2021 2030   HGBUR NEGATIVE 03/20/2021 2030  BILIRUBINUR NEGATIVE 03/18/2021 2030   KETONESUR NEGATIVE 03/10/2021 2030   PROTEINUR 30 (A) 03/19/2021 2030   UROBILINOGEN 0.2 11/19/2009 1055   NITRITE NEGATIVE 03/26/2021 2030   LEUKOCYTESUR NEGATIVE 03/11/2021 2030   Sepsis Labs: @LABRCNTIP (procalcitonin:4,lacticidven:4)  ) Recent Results (from the past 240 hour(s))  Culture, blood (Routine X 2) w Reflex to ID Panel     Status: None (Preliminary result)   Collection Time: 03/14/2021  7:42 PM   Specimen: BLOOD  Result Value Ref Range Status   Specimen Description   Final    BLOOD LEFT ANTECUBITAL Performed at [facility], [address], [place], [place]    Special Requests   Final    BOTTLES DRAWN AEROBIC AND ANAEROBIC Blood Culture adequate volume Performed at [facility], [address], [place], [place]    Culture   Final    NO GROWTH < 12 HOURS Performed at [facility], [address] [place], [place]    Report Status PENDING  Incomplete  Resp Panel by RT-PCR (Flu A&B, Covid) Nasopharyngeal Swab     Status: None   Collection Time: 03/24/2021  8:54 PM   Specimen:  Nasopharyngeal Swab; Nasopharyngeal(NP) swabs in vial transport medium  Result Value Ref Range Status   SARS Coronavirus 2 by RT PCR NEGATIVE NEGATIVE Final    Comment: (NOTE) SARS-CoV-2 target nucleic acids are NOT DETECTED.  The SARS-CoV-2 RNA is generally detectable in upper respiratory specimens during the acute phase of infection. The lowest concentration of SARS-CoV-2 viral copies this assay can detect is 138 copies/mL. A negative result does not preclude SARS-Cov-2 infection and should not be used as the sole basis for treatment or other patient management decisions. A negative result may occur with  improper specimen collection/handling, submission of specimen other than nasopharyngeal swab, presence of viral mutation(s) within the areas targeted by this assay, and inadequate number of viral copies(<138 copies/mL). A negative result must be combined with clinical observations, patient history, and epidemiological information. The expected result is Negative.  Fact Sheet for Patients:  03/24/21  Fact Sheet for Healthcare Providers:  BloggerCourse.com  This test is no t yet approved or cleared by the [employer]iousBroker.it FDA and  has been authorized for detection and/or diagnosis of SARS-CoV-2 by FDA under an Emergency Use Authorization (EUA). This EUA will remain  in effect (meaning this test can be used) for the duration of the COVID-19 declaration under Section 564(b)(1) of the Act, 21 U.S.C.section 360bbb-3(b)(1), unless the authorization is terminated  or revoked sooner.       Influenza A by PCR NEGATIVE NEGATIVE Final   Influenza B by PCR NEGATIVE NEGATIVE Final    Comment: (NOTE) The Xpert Xpress SARS-CoV-2/FLU/RSV plus assay is intended as an aid in the diagnosis of influenza from Nasopharyngeal swab specimens and should not be used as a sole basis for treatment. Nasal washings and aspirates are unacceptable for  Xpert Xpress SARS-CoV-2/FLU/RSV testing.  Fact Sheet for Patients: [place]  Fact Sheet for Healthcare Providers: BloggerCourse.com  This test is not yet approved or cleared by the SeriousBroker.it FDA and has been authorized for detection and/or diagnosis of SARS-CoV-2 by FDA under an Emergency Use Authorization (EUA). This EUA will remain in effect (meaning this test can be used) for the duration of the COVID-19 declaration under Section 564(b)(1) of the Act, 21 U.S.C. section 360bbb-3(b)(1), unless the authorization is terminated or revoked.  Performed at [facility],  [address], [place], [place] [address]   Culture, blood (Routine X 2) w Reflex to ID Panel     Status: None (Preliminary result)   Collection Time: 04-19-2021 10:15 PM   Specimen: BLOOD  Result Value Ref Range Status   Specimen Description BLOOD BLOOD RIGHT FOREARM  Final   Special Requests   Final    BOTTLES DRAWN AEROBIC AND ANAEROBIC Blood Culture results may not be optimal due to an excessive volume of blood received in culture bottles Performed at [facility], [address], [place], [place] [address]    Culture PENDING  Incomplete   Report Status PENDING  Incomplete  MRSA PCR Screening     Status: Abnormal   Collection Time: 03/23/21 12:15 AM  Result Value Ref Range Status   MRSA by PCR POSITIVE (A) NEGATIVE Final    Comment:        The GeneXpert MRSA Assay (FDA approved for NASAL specimens only), is one component of a comprehensive MRSA colonization surveillance program. It is not intended to diagnose MRSA infection nor to guide or monitor treatment for MRSA infections. RESULT CALLED TO, READ BACK BY AND VERIFIED WITH: STACY, RN @ 0241 ON 03/23/21 Riesa Pope Performed at [facility], [address] [place], [place] [address]          Radiology Studies: CT Head Wo Contrast  Result Date:  04/19/2021 CLINICAL DATA:  Altered mental status. EXAM: CT HEAD WITHOUT CONTRAST TECHNIQUE: Contiguous axial images were obtained from the base of the skull through the vertex without intravenous contrast. COMPARISON:  01/11/2020 FINDINGS: Brain: No evidence of acute infarction, hemorrhage, hydrocephalus, extra-axial collection or mass lesion/mass effect. Ventricular and sulcal enlargement consistent with mild diffuse atrophy. 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 Patchy areas of white matter hypoattenuation are also noted consistent with moderate to advanced chronic microvascular ischemic change. Old right thalamic and left basal ganglia lacunar infarcts. Vascular: No hyperdense vessel or unexpected calcification. Skull: Normal. Negative for fracture or focal lesion. Sinuses/Orbits: Globes and orbits are unremarkable. Sinuses are clear. Other: None. IMPRESSION: 1. No acute intracranial abnormalities. 2. Old infarcts, moderate to advanced chronic microvascular ischemic change and mild atrophy, stable compared to the prior CT. Electronically Signed   By: Amie Portland M.D.   On: 2021-04-19 20:20   DG Chest Port 1 View  Result Date: 19-Apr-2021 CLINICAL DATA:  Cough. EXAM: PORTABLE CHEST 1 VIEW COMPARISON:  10/16/2020. FINDINGS: Normal heart size. Loop recorder is identified in the projection of the left heart. Bony structures appear intact. Pulmonary vascular congestion. No airspace opacities. No pleural effusion or frank pulmonary edema. IMPRESSION: Pulmonary vascular congestion. Electronically Signed   By: Signa Kell M.D.   On: 19-Apr-2021 19:38        Scheduled Meds:  chlorhexidine  15 mL Mouth Rinse BID   Chlorhexidine Gluconate Cloth  6 each Topical Daily   heparin  5,000 Units Subcutaneous Q8H   mouth rinse  15 mL Mouth Rinse BID   mupirocin ointment  1 application Nasal BID   sodium zirconium cyclosilicate  10 g Oral TID   Continuous Infusions:  sodium chloride 999 mL/hr at  03/23/21 0400   cefTRIAXone (ROCEPHIN)  IV     dextrose 100 mL/hr at 03/23/21 0400   dextrose 5% lactated ringers       LOS: 1 day    Time spent: 45 min    Silvano Bilis, MD [facility]   If 7PM-7AM,  please contact night-coverage [email or web address] Password [facility] 03/23/2021, 9:01 AM

## 2021-03-23 NOTE — Progress Notes (Signed)
eLink Physician-Brief Progress Note Patient Name: Janice Little DOB: 09/23/1946 MRN: [id number]   Date of Service  03/23/2021  HPI/Events of Note  Hyperkalemia - Last K+ = 6.2. Nephrology note says continue Lokelma TID, however, do not see Lokelma on medication list.   eICU Interventions  Plan: Change D5 LR IV infusion at 125 mL/hour to D5 0.9 NaCl to rum at 125 mL/hour. Lokelma 10 gm per tube now and TID.     Intervention Category Major Interventions: Electrolyte abnormality - evaluation and management  Lenell Antu 03/23/2021, 7:36 PM

## 2021-03-23 NOTE — Progress Notes (Signed)
Notified bedside nurse of need to draw repeat lactic acid. 

## 2021-03-23 NOTE — Consult Note (Signed)
NAME:  Janice Little, MRN:  [id number], DOB:  1946/02/23, LOS: 1 ADMISSION DATE:  03/29/2021, CONSULTATION DATE:  03/30/2021 REFERRING MD:  ED, CHIEF COMPLAINT:  hypoglycemia   History of Present Illness:  58 year with baseline severe cognitive impairment lives at a facility presents with hypoglycemia. History per EMR. Can not obtain history via patient due to patient factors, encephalopathy.  Reportedly more lethargic last few days. Diarrhea began today. Noted to have low Bgs. Receives long acting IV insulin, most recently 6/19 PM. In ED, CXR clear on my review and interpretation. Satting 100% on baseline 2L [place]. Labs with leukocytosis, anemia a bit worse than last check during prior admission. K 7.2, Cr > 4. Bgs low. UA with negative LE, nitrites but with mild pyuria.  Admitted 10/2020 with covid. D/c summary reviewed. On 2L [place] since.   Pertinent  Medical History  Cognitive impairment, chronic respiratory failure (2L [place]), DM, HTN  Significant Hospital Events: Including procedures, antibiotic start and stop dates in addition to other pertinent events   6/20 admitted with ARF, hypoglycemia, hyperkalemia  Interim History / Subjective:  As above  Objective   Blood pressure 113/83, pulse 86, temperature (!) 95.4 F (35.2 C), temperature source Rectal, resp. rate (!) 22, SpO2 100 %.        Intake/Output Summary (Last 24 hours) at 03/23/2021 0016 Last data filed at 03/06/2021 2320 Gross per 24 hour  Intake --  Output 800 ml  Net -800 ml   There were no vitals filed for this visit.  Examination: General: ill appearing, lying in bed HENT: AT/[place] Lungs: CTAB, NWOB, mild tachypnea Cardiovascular: RRR, no murmurs Abdomen: ND, BS present Extremities: warm, Edema in LUE, non edematous elsewhere Neuro: eyes open, does not follow commands, groans, moves all extremities   Labs/imaging that I havepersonally reviewed  (right click and "Reselect all SmartList Selections" daily)  CBC,  chem, Bgs, UA, CXR  Resolved Hospital Problem list   N/a  Assessment & Plan:  Hyperkalemia due to acute renal failure: Suspect due to hypovolemia in setting of diarrhea, poor PO intake. 800 cc UOP recorded in ED is encouraging. S/p shifting of K  in ED. --Lokelma TID x 3 doses ordered, may need NG/OG --IV hydration --EKG w/o T wave peaks --repeat K  Hypoglycemia: Suspect related to insulin accumulation in the setting of renal failure: --supplement with dextrose containing fluids (D10) --No insulin  Lactic Acidemia: Suspect largely related to hypovolemia. Possibly related to sepsis. CXR clear. UA w/o LE or nitrites but mild pyuria. --repeat LA --CTX empiric coverage for urine  Acute renal failure: felt most likely due to hypovolemia. Possible component of sepsis --IVF ordered --repeat BMP  Cognitive impairment: "sparse speech" noted last admission, was DNI last admission. Not interactive at time of evaluation, BG 50 which is confounding.  --recommend palliative consultation  Toxic metabolic encephalopathy: Hypoglycemia, uremia, possible sepsis, baseline cognitive impairment. --treat possible reversible causes as above  Best Practice (right click and "Reselect all SmartList Selections" daily)   Per primary Disposition: admit to progressive care         Labs   CBC: Recent Labs  Lab 03/10/2021 1848  WBC 23.3*  NEUTROABS 20.5*  HGB 7.8*  HCT 26.3*  MCV 79.0*  PLT 473*    Basic Metabolic Panel: Recent Labs  Lab 03/06/2021 1848  NA 133*  K 7.2*  CL 98  CO2 15*  GLUCOSE 39*  BUN 83*  CREATININE 4.32*  CALCIUM 9.2   GFR:  CrCl cannot be calculated (Unknown ideal weight.). Recent Labs  Lab Apr 06, 2021 1848  WBC 23.3*  LATICACIDVEN 7.9*    Liver Function Tests: Recent Labs  Lab 2021-04-06 1848  AST 21  ALT 16  ALKPHOS 56  BILITOT 0.3  PROT 6.1*  ALBUMIN 2.9*   No results for input(s): LIPASE, AMYLASE in the last 168 hours. No results for input(s):  AMMONIA in the last 168 hours.  ABG No results found for: PHART, PCO2ART, PO2ART, HCO3, TCO2, ACIDBASEDEF, O2SAT   Coagulation Profile: No results for input(s): INR, PROTIME in the last 168 hours.  Cardiac Enzymes: No results for input(s): CKTOTAL, CKMB, CKMBINDEX, TROPONINI in the last 168 hours.  HbA1C: Hgb A1c MFr Bld  Date/Time Value Ref Range Status  03/01/2019 02:47 AM 5.6 4.8 - 5.6 % Final    Comment:    (NOTE) Pre diabetes:          5.7%-6.4% Diabetes:              >6.4% Glycemic control for   <7.0% adults with diabetes     CBG: Recent Labs  Lab 04/06/21 1851 06-Apr-2021 2005 04/06/21 2057 2021/04/06 2122 April 06, 2021 2307  GLUCAP 78 26* 107* 92 134*    Review of Systems:   Unobtainable due to patient factors.  Past Medical History:  She,  has a past medical history of Chronic kidney disease (CKD), stage II (mild), Chronic pain, Chronic respiratory failure (HCC), DM type 2 (diabetes mellitus, type 2) (HCC), Dysphagia, oropharyngeal phase, Fatty liver, GERD (gastroesophageal reflux disease), History of hepatitis as a child, HTN (hypertension), Hyperlipidemia, Monoplegia of upper limb (HCC), MS (multiple sclerosis) (HCC), Obesity, Osteopenia, Primary hyperparathyroidism (HCC), and Vitamin D deficiency.   Surgical History:   Past Surgical History:  Procedure Laterality Date   ANTERIOR CRUCIATE LIGAMENT REPAIR Left    LOOP RECORDER INSERTION N/A 03/05/2019   Procedure: LOOP RECORDER INSERTION;  Surgeon: Marinus Maw, MD;  Location: [facility] INVASIVE CV LAB;  Service: Cardiovascular;  Laterality: N/A;   VESICOVAGINAL FISTULA CLOSURE W/ TAH       Social History:   reports that she has never smoked. She has never used smokeless tobacco. She reports that she does not drink alcohol and does not use drugs.   Family History:  Her family history includes COPD in her father; Lung cancer in her father; Mental illness in her mother.   Allergies No Known Allergies   Home  Medications  Prior to Admission medications   Medication Sig Start Date End Date Taking? Authorizing Provider  acetaminophen (TYLENOL) 325 MG tablet Take 2 tablets (650 mg total) by mouth every 4 (four) hours as needed for mild pain (or temp > 37.5 C (99.5 F)). 03/05/19   Vassie Loll, MD  amLODipine (NORVASC) 5 MG tablet Take 5 mg by mouth daily. 07/24/19   [provider]  amoxicillin-clavulanate (AUGMENTIN) 875-125 MG tablet Take 1 tablet by mouth every 12 (twelve) hours. 10/23/20   Tyrone Nine, MD  aspirin 325 MG tablet Take 1 tablet (325 mg total) by mouth daily. 03/06/19   Vassie Loll, MD  Cholecalciferol (VITAMIN D) 50 MCG (2000 UT) CAPS Take 2,000 Units by mouth daily.    [provider]  DULoxetine (CYMBALTA) 60 MG capsule Take 60 mg by mouth daily.    [provider]  gabapentin (NEURONTIN) 100 MG capsule Take 100 mg by mouth 3 (three) times daily.    [provider]  hydrALAZINE (APRESOLINE) 25 MG tablet Take 3  tablets (75 mg total) by mouth every 8 (eight) hours. 03/05/19   Vassie Loll, MD  Infant Care Products [facility]) OINT Apply 1 application topically daily. Apply to buttocks.    [provider]  insulin glargine (LANTUS) 100 UNIT/ML injection Inject 34 Units into the skin at bedtime.    [provider]  insulin lispro (HUMALOG) 100 UNIT/ML injection Inject 2-11 Units into the skin 3 (three) times daily before meals. Sliding scale: 101-150=2 U 151-200=3 U 201-250=5 U 251-300=7 U 301-350=9 U >350=11 U  Call MD for BS>400 or <60    [provider]  lisinopril (ZESTRIL) 40 MG tablet Take 40 mg by mouth daily.    [provider]  Melatonin 3 MG TABS Take 3 mg by mouth at bedtime.     [provider]  metFORMIN (GLUCOPHAGE-XR) 500 MG 24 hr tablet Take 1 tablet (500 mg total) by mouth 2 (two) times a day. 03/05/19   Vassie Loll, MD  Multiple Vitamins-Minerals (CEROVITE SENIOR) TABS Take 1 tablet by  mouth daily.    [provider]  omeprazole (PRILOSEC OTC) 20 MG tablet Take 20 mg by mouth daily.    [provider]  polyethylene glycol powder (GLYCOLAX/MIRALAX) 17 GM/SCOOP powder Take 17 g by mouth daily as needed for mild constipation.    [provider]     Critical care time: n/a

## 2021-03-23 NOTE — Progress Notes (Signed)
Pharmacy Antibiotic Note  Janice Little is a 75 y.o. female admitted on Apr 04, 2021 with UTI, sepsis.  Pharmacy has been consulted for Cefepime dosing.  Plan: Cefepime 2g IV q8h  Weight: 71.6 kg (157 lb 13.6 oz)  Temp (24hrs), Avg:97 F (36.1 C), Min:95.4 F (35.2 C), Max:99 F (37.2 C)  Recent Labs  Lab 04/04/21 1848 03/23/21 0100 03/23/21 0312 03/23/21 0417 03/23/21 0815 03/23/21 0822  WBC 23.3*  --  17.4*  --   --   --   CREATININE 4.32*  --  3.97*  --  3.84*  --   LATICACIDVEN 7.9* 8.7*  --  6.9*  --  4.5*    Estimated Creatinine Clearance: 12.4 mL/min (A) (by C-G formula based on SCr of 3.84 mg/dL (H)).    No Known Allergies  Antimicrobials this admission: 6/20 Cefepime >>    Dose adjustments this admission:     Microbiology results: 6/20 BCx: 6/20 UCx:   6/20 Sputum:   6/21 MRSA PCR: positive   Thank you for allowing pharmacy to be a part of this patient's care.  Lynann Beaver PharmD, BCPS Clinical Pharmacist [facility] main pharmacy [phone] 03/23/2021 9:43 AM

## 2021-03-23 NOTE — Progress Notes (Signed)
Initial Nutrition Assessment  DOCUMENTATION CODES:   Not applicable  INTERVENTION:  - will order Osmolite 1.5 @ 25 ml/hr to advance by 10 ml every 4 hours to reach goal rate of 55 ml/hr with 45 ml Prosource TF BID and 30 ml free water QID. - at goal rate, this regimen will provide 2060 kcal, 105 grams protein, and 1126 ml free water.    NUTRITION DIAGNOSIS:   Inadequate oral intake related to inability to eat as evidenced by other (comment) (RN report; patient who is disoriented x4).  GOAL:   Patient will meet greater than or equal to 90% of their needs  MONITOR:   TF tolerance, Diet advancement, Labs, Weight trends, Skin  REASON FOR ASSESSMENT:   Malnutrition Screening Tool, Consult Enteral/tube feeding initiation and management  ASSESSMENT:   75 y.o. female with medical history of DM, stage 3 CKD, chronic pain syndrome, chronic respiratory failure 2/2 COPD, multiple sclerosis, GERD, essential HTN, and primary hyperparathyroidism s/p surgery. She presented to the ED via EMS from [facility] due to hypoglycemia. In the ED she was dx with AKI and noted to be hyperkalemic. She was admitted for severe sepsis with multiple electrolyte abnormalities.  Patient discussed in rounds this AM. Patient is disoriented x4. Her next of kin is a sister who lives in [place].   NGT placed in L nare today at ~0920. Abdominal xray done but not yet read.   Weight yesterday was 158 lb. And weight is +3 lb compared to weight on 10/16/20 (the most recently documented weight PTA).   Per notes: - severe sepsis - hyperkalemia - hypoglycemia on admission--high rate D5 - hyponatremia - hx of MS - acute metabolic encephalopathy   Labs reviewed; CBGs: 71-98 mg/dl, Na: 784 mmol/l, K: 6.5 mmol/l, BUN: 76 mg/dl, creatinine: 6.96 mg/dl, Ca: 8 mg/dl, GFR: 12 ml/min. Medications reviewed; 10 g lokelma per NGT x3 doses today. IVF; D5-LR @ 200 ml/hr (816 kcal/24 hrs).     NUTRITION - FOCUSED PHYSICAL  EXAM:  Completed; mild depletion to temporal area, no other fat or muscle depletion, no notable edema at this time.  Diet Order:   Diet Order             Diet Heart Room service appropriate? Yes; Fluid consistency: Thin  Diet effective now                   EDUCATION NEEDS:   Not appropriate for education at this time  Skin:  Skin Assessment: Skin Integrity Issues: Skin Integrity Issues:: DTI, Stage III DTI: LLE Stage III: L tibia  Last BM:  6/21 (type 6 x1)  Height:   Ht Readings from Last 1 Encounters:  10/16/20 5\' 5"  (1.651 m)    Weight:   Wt Readings from Last 1 Encounters:  03/24/2021 71.6 kg     Estimated Nutritional Needs:  Kcal:  1950-2200 kcal Protein:  95-110 grams Fluid:  >/= 2.2 L/day       03/24/21, MS, RD, LDN, CNSC Inpatient Clinical Dietitian RD pager # available in [facility]  After hours/weekend pager # available in [facility]

## 2021-03-24 ENCOUNTER — Inpatient Hospital Stay (HOSPITAL_COMMUNITY): Payer: Medicare Other

## 2021-03-24 DIAGNOSIS — I5031 Acute diastolic (congestive) heart failure: Secondary | ICD-10-CM

## 2021-03-24 DIAGNOSIS — G9341 Metabolic encephalopathy: Secondary | ICD-10-CM | POA: Diagnosis not present

## 2021-03-24 LAB — BLOOD GAS, ARTERIAL
Acid-base deficit: 8.6 mmol/L — ABNORMAL HIGH (ref 0.0–2.0)
Bicarbonate: 15.9 mmol/L — ABNORMAL LOW (ref 20.0–28.0)
O2 Saturation: 95.6 %
Patient temperature: 99.7
pCO2 arterial: 31.3 mmHg — ABNORMAL LOW (ref 32.0–48.0)
pH, Arterial: 7.331 — ABNORMAL LOW (ref 7.350–7.450)
pO2, Arterial: 87.1 mmHg (ref 83.0–108.0)

## 2021-03-24 LAB — BASIC METABOLIC PANEL
Anion gap: 13 (ref 5–15)
Anion gap: 12 (ref 5–15)
Anion gap: 10 (ref 5–15)
BUN: 72 mg/dL — ABNORMAL HIGH (ref 8–23)
BUN: 76 mg/dL — ABNORMAL HIGH (ref 8–23)
BUN: 78 mg/dL — ABNORMAL HIGH (ref 8–23)
CO2: 16 mmol/L — ABNORMAL LOW (ref 22–32)
CO2: 16 mmol/L — ABNORMAL LOW (ref 22–32)
CO2: 18 mmol/L — ABNORMAL LOW (ref 22–32)
Calcium: 8 mg/dL — ABNORMAL LOW (ref 8.9–10.3)
Calcium: 7.7 mg/dL — ABNORMAL LOW (ref 8.9–10.3)
Calcium: 7.8 mg/dL — ABNORMAL LOW (ref 8.9–10.3)
Chloride: 98 mmol/L (ref 98–111)
Chloride: 100 mmol/L (ref 98–111)
Chloride: 101 mmol/L (ref 98–111)
Creatinine, Ser: 3.51 mg/dL — ABNORMAL HIGH (ref 0.44–1.00)
Creatinine, Ser: 3.57 mg/dL — ABNORMAL HIGH (ref 0.44–1.00)
Creatinine, Ser: 3.48 mg/dL — ABNORMAL HIGH (ref 0.44–1.00)
GFR, Estimated: 13 mL/min — ABNORMAL LOW (ref >60)
GFR, Estimated: 13 mL/min — ABNORMAL LOW (ref >60)
GFR, Estimated: 13 mL/min — ABNORMAL LOW (ref >60)
Glucose, Bld: 163 mg/dL — ABNORMAL HIGH (ref 70–99)
Glucose, Bld: 156 mg/dL — ABNORMAL HIGH (ref 70–99)
Glucose, Bld: 239 mg/dL — ABNORMAL HIGH (ref 70–99)
Potassium: 5.5 mmol/L — ABNORMAL HIGH (ref 3.5–5.1)
Potassium: 4.8 mmol/L (ref 3.5–5.1)
Potassium: 4.7 mmol/L (ref 3.5–5.1)
Sodium: 127 mmol/L — ABNORMAL LOW (ref 135–145)
Sodium: 128 mmol/L — ABNORMAL LOW (ref 135–145)
Sodium: 129 mmol/L — ABNORMAL LOW (ref 135–145)

## 2021-03-24 LAB — ECHOCARDIOGRAM COMPLETE
Area-P 1/2: 3.6 cm2
MV VTI: 1.4 cm2
S' Lateral: 2.3 cm
Weight: 2737.23 oz

## 2021-03-24 LAB — GLUCOSE, CAPILLARY
Glucose-Capillary: 139 mg/dL — ABNORMAL HIGH (ref 70–99)
Glucose-Capillary: 141 mg/dL — ABNORMAL HIGH (ref 70–99)
Glucose-Capillary: 163 mg/dL — ABNORMAL HIGH (ref 70–99)
Glucose-Capillary: 163 mg/dL — ABNORMAL HIGH (ref 70–99)
Glucose-Capillary: 187 mg/dL — ABNORMAL HIGH (ref 70–99)
Glucose-Capillary: 190 mg/dL — ABNORMAL HIGH (ref 70–99)
Glucose-Capillary: 210 mg/dL — ABNORMAL HIGH (ref 70–99)
Glucose-Capillary: 220 mg/dL — ABNORMAL HIGH (ref 70–99)
Glucose-Capillary: 224 mg/dL — ABNORMAL HIGH (ref 70–99)
Glucose-Capillary: 231 mg/dL — ABNORMAL HIGH (ref 70–99)

## 2021-03-24 LAB — URINE CULTURE: Culture: 70000 — AB

## 2021-03-24 LAB — PHOSPHORUS: Phosphorus: 3.3 mg/dL (ref 2.5–4.6)

## 2021-03-24 LAB — LACTIC ACID, PLASMA: Lactic Acid, Venous: 2.1 mmol/L (ref 0.5–1.9)

## 2021-03-24 LAB — HEMOGLOBIN AND HEMATOCRIT, BLOOD
HCT: 22.2 % — ABNORMAL LOW (ref 36.0–46.0)
Hemoglobin: 6.9 g/dL — CL (ref 12.0–15.0)

## 2021-03-24 LAB — MAGNESIUM: Magnesium: 1.2 mg/dL — ABNORMAL LOW (ref 1.7–2.4)

## 2021-03-24 LAB — PREPARE RBC (CROSSMATCH)

## 2021-03-24 MED ORDER — SODIUM CHLORIDE 0.9% IV SOLUTION: Freq: Once | INTRAVENOUS | Status: AC

## 2021-03-24 MED ORDER — SODIUM CHLORIDE 0.9 % IV SOLN
250.0000 mL | INTRAVENOUS | Status: DC
  Administered 2021-03-24: 250 mL via INTRAVENOUS

## 2021-03-24 MED ORDER — SODIUM PHOSPHATES 45 MMOLE/15ML IV SOLN
10.0000 mmol | Freq: Once | INTRAVENOUS | Status: DC
  Administered 2021-03-24: 10 mmol via INTRAVENOUS
  Filled 2021-03-24: qty 3.33

## 2021-03-24 MED ORDER — SODIUM CHLORIDE 0.9 % IV SOLN
2.0000 g | INTRAVENOUS | Status: DC
  Administered 2021-03-24 – 2021-03-27 (×4): 2 g via INTRAVENOUS
  Filled 2021-03-24 (×3): qty 20
  Filled 2021-03-24: qty 2

## 2021-03-24 MED ORDER — SODIUM BICARBONATE 650 MG PO TABS
650.0000 mg | ORAL_TABLET | Freq: Three times a day (TID) | ORAL | Status: DC
  Administered 2021-03-24 – 2021-03-29 (×16): 650 mg via ORAL
  Filled 2021-03-24 (×16): qty 1

## 2021-03-24 MED ORDER — MAGNESIUM SULFATE 2 GM/50ML IV SOLN
2.0000 g | Freq: Once | INTRAVENOUS | Status: AC
  Administered 2021-03-24: 2 g via INTRAVENOUS
  Filled 2021-03-24: qty 50

## 2021-03-24 MED ORDER — SODIUM CHLORIDE 0.9 % IV BOLUS
500.0000 mL | Freq: Once | INTRAVENOUS | Status: AC
  Administered 2021-03-24: 500 mL via INTRAVENOUS

## 2021-03-24 MED ORDER — SODIUM PHOSPHATES 45 MMOLE/15ML IV SOLN: 30.0000 mmol | Freq: Once | INTRAVENOUS | Status: DC

## 2021-03-24 MED ORDER — PHENYLEPHRINE HCL-NACL 10-0.9 MG/250ML-% IV SOLN
25.0000 ug/min | INTRAVENOUS | Status: DC
  Administered 2021-03-24: 25 ug/min via INTRAVENOUS
  Filled 2021-03-24: qty 250

## 2021-03-24 MED ORDER — DEXTROSE-NACL 5-0.9 % IV SOLN: INTRAVENOUS | Status: DC

## 2021-03-24 NOTE — Progress Notes (Signed)
eLink Physician-Brief Progress Note Patient Name: Janice Little DOB: [birth year] MRN: [id number]   Date of Service  03/24/2021  HPI/Events of Note  Hypotension - BP = 102/37 with MAP = 56. No CVP or CVP.  eICU Interventions  Plan: Bolus with 0.9 NaCl 500 mL IV over 30 minutes now. Phenylephrine IV infusion via PIV. Titrate to MAP _> 65.  Bolus with 0.9 NaCl 500 mL IV over 30 minutes now. ABG STAT. Respiratory therapy to place A-line.      Intervention Category Major Interventions: Hypotension - evaluation and management  Bindi Klomp [place] 03/24/2021, 2:30 AM

## 2021-03-24 NOTE — Progress Notes (Signed)
Admit: 03/18/2021 LOS: 2  48M PMH including MS and chronic impaired cognition at SNF, Hypertension on ACE inhibitor presenting with AKI, hyperkalemia, progressive hyponatremia, normal anion gap metabolic acidosis  Subjective:  K improved to < 5 SCr not much improved thus far  SNa 128 1L UOP yesterday Renal US limited nonvisualized L kidney 2/2 bowel gas, R kidney w/o HN More alert, moaning At baseline is somewhat verbal per report from RNs/SNF  06/21 0701 - 06/22 0700 In: 1716.8 [I.V.:861.8; NG/GT:255.4; IV Piggyback:599.5] Out: 1015 [Urine:1015]  Filed Weights   03/04/2021 2301 03/24/21 0500  Weight: 71.6 kg 77.6 kg    Scheduled Meds:  chlorhexidine  15 mL Mouth Rinse BID   Chlorhexidine Gluconate Cloth  6 each Topical Daily   feeding supplement (PROSource TF)  45 mL Per Tube BID   free water  30 mL Per Tube Q6H   heparin  5,000 Units Subcutaneous Q8H   mouth rinse  15 mL Mouth Rinse BID   mupirocin ointment  1 application Nasal BID   sodium bicarbonate  650 mg Oral TID   sodium zirconium cyclosilicate  10 g Per Tube TID   Continuous Infusions:  sodium chloride 250 mL (03/24/21 1208)   cefTRIAXone (ROCEPHIN)  IV     dextrose 5 % and 0.9% NaCl 35 mL/hr at 03/24/21 0951   feeding supplement (OSMOLITE 1.5 CAL) 1,000 mL (03/24/21 0943)   magnesium sulfate bolus IVPB     sodium phosphate  Dextrose 5% IVPB 10 mmol (03/24/21 0908)   PRN Meds:.acetaminophen **OR** acetaminophen, ondansetron **OR** ondansetron (ZOFRAN) IV  Current Labs: reviewed    Physical Exam:  Blood pressure 110/66, pulse 99, temperature (!) 97.52 F (36.4 C), resp. rate 16, weight 77.6 kg, SpO2 100 %. GEN: Chronically ill-appearing, NAD, not interactive, eyes open randomly ENT: NCAT, mild temporal wasting EYES: EOMI CV: Regular, normal S1 and S2 PULM: Clear bilaterally, normal work of breathing ABD: Soft, nontender, no masses or HSM SKIN: No rashes or lesions EXT: No peripheral edema  A Oliguric  AKI: likely hypovolemia/ACEi +/- transition to ATN.   Hyperkalemia, severe, improved with med mgmt.  EKG without changes at presentation.  Likely related to 1, use of ACEi. Can stop Lokelma and monitor Hypertension, on 3 agents, including ACE inhibitor, held at presentation Progressive hyponatremia likely from excessive free water intake to try to correct hyperglycemia, slowly improving NAG metabolic acidosis likely related to #1 Sepsis, likely UTI source, antibiotics per TRH/CCM, cultures pending MS, impaired cognition chronically, at SNF History of surgical correction of primary hyperparathyroidism, calcium normal to low DM2, on metformin as outpatient Hypoglycemia, likely combination of relative insulin access and low GFR  P As above stop Lokelma Would not replace P unless < 2 Cont to limit free water I don't think needs more crystalloid at this poiont I don't think she is a short or long term RRT candidate given #7 Medication Issues; Preferred narcotic agents for pain control are hydromorphone, fentanyl, and methadone. Morphine should not be used.  Baclofen should be avoided Avoid oral sodium phosphate and magnesium citrate based laxatives / bowel preps    Sabra Heck MD 03/24/2021, 12:08 PM  Recent Labs  Lab 03/23/21 1156 03/23/21 1526 03/23/21 2020 03/24/21 0200 03/24/21 0320 03/24/21 0853  NA 128*  --  128* 127*  --  128*  K 6.2*  --  6.0* 5.5*  --  4.8  CL 100  --  98 98  --  100  CO2 16*  --  15* 16*  --  16*  GLUCOSE 47*  --  148* 163*  --  156*  BUN 74*  --  72* 72*  --  76*  CREATININE 3.78*  --  3.51* 3.51*  --  3.57*  CALCIUM 8.3*  --  8.1* 8.0*  --  7.7*  PHOS 3.7 3.7  --   --  3.3  --    Recent Labs  Lab 2021/03/31 1848 03/23/21 0312  WBC 23.3* 17.4*  NEUTROABS 20.5*  --   HGB 7.8* 7.3*  HCT 26.3* 24.1*  MCV 79.0* 78.0*  PLT 473* 404*

## 2021-03-24 NOTE — Progress Notes (Addendum)
PROGRESS NOTE    Janice NimsCarol T Marsland  ZOX:[id number] DOB: 11/11/1945 DOA: May 10, 2021 PCP: Juluis RainierBarnes, Elizabeth, MD   Brief Narrative:  This 75 y.o. female with PMH significant of diabetes, chronic kidney disease stage III, chronic pain syndrome, chronic respiratory failure secondary to COPD, multiple sclerosis, GERD, essential hypertension and primary hyperparathyroidism s/p surgery who presents to the ED with hypoglycemia by EMS.  Patient apparently started having diarrhea  at [facility].  Her blood glucose was noted to be 34 then 70 and 25.  She was given glucagon x 4 .  She has not received any insulin at SNF.  When patient arrived the ER she appeared drowsy, unable to give any history.  Her initial blood sugar was 46.  She was given D10 and blood sugar went to 130.  She is so lethargic this not much history was obtained.  Patient was noted to be in acute kidney failure with creatinine more than 4.0  It was previously normal. She was found to have hyperkalemia as well as lactic acidosis which has resolved.  Suspected pyuria and severe sepsis.  Her blood pressure is however stable.  She is hypoxic but has chronically been on 2 L of oxygen.   She is being admitted as a case of severe sepsis with multiple electrolyte abnormalities.  Critical care consulted and will follow with us.   Assessment & Plan:   Principal Problem:   Severe sepsis (HCC) Active Problems:   Type 2 diabetes mellitus with hypoglycemia without coma (HCC)   Hyperlipidemia   Multiple sclerosis (HCC)   Essential hypertension   GERD   Acute lower UTI   Acute metabolic encephalopathy   AKI (acute kidney injury) (HCC)   Hyperkalemia   Hyponatremia   Hypoglycemia   Pressure ulcer of left leg   Severe sepsis presumed to be secondary to the UTI: Patient presented with tachycardia, tachypnea, hypotension,  urinary retention, elevated lactic acid. UA: leukocytes +, nitrite + Started on empiric antibiotics vancomycin and  cefepime(MRSA nares+) Follow blood and urine cultures Continue IV hydration.  Hyperkalemia  : Could be due to acute renal failure.  Due to dehydration in the setting of diarrhea Treated with calcium gluconate.  Continue Lokelma,  potassium has improved.  Acute Kidney injury : Multifactorial: Patient presented with serum creatinine up to 4.3,  Baseline creatinine normal. This could be multifactorial in the setting of sepsis and dehydration. Nephrology is following recommended to continue IV hydration. Avoid nephrotoxic medications, creatinine trending down 3.48  Suspected stercoral colitis: Seen on CT scan.  Continue glycerin suppository   Hypoglycemia :  Could be due to insulin accumulation in the setting of acute renal failure which is improving. Hold home insulin.  Blood glucose is improving.   Hyponatremia:  Due to dehydration,  continue to monitor. Continue gentle IV hydration.   History of multiple sclerosis : Stable  History of CVA:  Continue home regimen when she is stable.  Acute metabolic encephalopathy could be secondary to acute illness: Continue current management.  Insulin-dependent diabetes:  Hold all insulin and her metformin due to hypoglycemia.   Monitor capillary blood glucose closely.   Essential hypertension: Hold blood pressure medication in the setting of sepsis.  GERD : resume PPI   Chronic pain syndrome: Resume Cymbalta and gabapentin  Left leg decubitus ulcer - local wound care   DVT prophylaxis: Heparin Code Status: Full code. Family Communication: No family at bed side. Disposition Plan:   Status is: Inpatient  Remains inpatient appropriate because:Inpatient  level of care appropriate due to severity of illness  Dispo: The patient is from: SNF              Anticipated d/c is to: SNF              Patient currently is not medically stable to d/c.   Difficult to place patient No  Consultants:  Nephrology  Procedures:   Antimicrobials:   Anti-infectives (From admission, onward)    Start     Dose/Rate Route Frequency Ordered Stop   03/24/21 2200  cefTRIAXone (ROCEPHIN) 2 g in sodium chloride 0.9 % 100 mL IVPB        2 g 200 mL/hr over 30 Minutes Intravenous Every 24 hours 03/24/21 0936     03/23/21 2200  cefTRIAXone (ROCEPHIN) 1 g in sodium chloride 0.9 % 100 mL IVPB  Status:  Discontinued        1 g 200 mL/hr over 30 Minutes Intravenous Every 24 hours 03/23/21 0022 03/23/21 0916   03/23/21 2200  ceFEPIme (MAXIPIME) 2 g in sodium chloride 0.9 % 100 mL IVPB  Status:  Discontinued        2 g 200 mL/hr over 30 Minutes Intravenous Every 24 hours 03/23/21 0945 03/24/21 0936   03/23/21 0145  ceFEPIme (MAXIPIME) 2 g in sodium chloride 0.9 % 100 mL IVPB  Status:  Discontinued        2 g 200 mL/hr over 30 Minutes Intravenous  Once 03/23/21 0045 03/23/21 0046   03/16/2021 2130  ceFEPIme (MAXIPIME) 2 g in sodium chloride 0.9 % 100 mL IVPB        2 g 200 mL/hr over 30 Minutes Intravenous  Once 03/23/2021 2121 03/23/21 0046   03/31/2021 2130  metroNIDAZOLE (FLAGYL) IVPB 500 mg        500 mg 100 mL/hr over 60 Minutes Intravenous  Once 03/06/2021 2121 03/23/21 0130   03/12/2021 2130  vancomycin (VANCOCIN) IVPB 1000 mg/200 mL premix        1,000 mg 200 mL/hr over 60 Minutes Intravenous  Once 03/14/2021 2121 03/23/21 0210        Subjective: Patient is seen and examined at bedside.  Overnight events noted. She is chronically ill looking, she is nonverbal, opens eyes, blinks, does not appear in any acute distress.  Objective: Vitals:   03/24/21 1100 03/24/21 1200 03/24/21 1300 03/24/21 1301  BP:      Pulse: 99 99 96   Resp:  (!) 25 20   Temp: (!) 97.52 F (36.4 C) (!) 97.52 F (36.4 C) (!) 97.52 F (36.4 C)   TempSrc:      SpO2: 100% 100% (!) 89% 96%  Weight:        Intake/Output Summary (Last 24 hours) at 03/24/2021 1525 Last data filed at 03/24/2021 1356 Gross per 24 hour  Intake 3434.64 ml  Output 715 ml   Net 2719.64 ml   Filed Weights   03/24/2021 2301 03/24/21 0500  Weight: 71.6 kg 77.6 kg    Examination:  General exam: Appears calm and comfortable , does not appear in any acute distress. Respiratory system: Mild baseline crackles on auscultation. Respiratory effort normal. Cardiovascular system: S1 & S2 heard, RRR. No JVD, murmurs, rubs, gallops or clicks. No pedal edema. Gastrointestinal system: Abdomen is nondistended, soft and nontender. No organomegaly or masses felt. Normal bowel sounds heard. Central nervous system: Alert and oriented. No focal neurological deficits. Extremities: No edema, no cyanosis, no clubbing. Skin: No rashes, lesions or  ulcers Psychiatry: not assessed.    Data Reviewed: I have personally reviewed following labs and imaging studies  CBC: Recent Labs  Lab 07-Apr-2021 1848 03/23/21 0312  WBC 23.3* 17.4*  NEUTROABS 20.5*  --   HGB 7.8* 7.3*  HCT 26.3* 24.1*  MCV 79.0* 78.0*  PLT 473* 404*   Basic Metabolic Panel: Recent Labs  Lab 03/23/21 1156 03/23/21 1526 03/23/21 2020 03/24/21 0200 03/24/21 0320 03/24/21 0853 03/24/21 1412  NA 128*  --  128* 127*  --  128* 129*  K 6.2*  --  6.0* 5.5*  --  4.8 4.7  CL 100  --  98 98  --  100 101  CO2 16*  --  15* 16*  --  16* 18*  GLUCOSE 47*  --  148* 163*  --  156* 239*  BUN 74*  --  72* 72*  --  76* 78*  CREATININE 3.78*  --  3.51* 3.51*  --  3.57* 3.48*  CALCIUM 8.3*  --  8.1* 8.0*  --  7.7* 7.8*  MG 1.2* 1.2*  --   --  1.2*  --   --   PHOS 3.7 3.7  --   --  3.3  --   --    GFR: Estimated Creatinine Clearance: 14.2 mL/min (A) (by C-G formula based on SCr of 3.48 mg/dL (H)). Liver Function Tests: Recent Labs  Lab 04/07/21 1848 03/23/21 0312  AST 21 22  ALT 16 13  ALKPHOS 56 56  BILITOT 0.3 0.4  PROT 6.1* 5.3*  ALBUMIN 2.9* 2.7*   No results for input(s): LIPASE, AMYLASE in the last 168 hours. Recent Labs  Lab 03/23/21 0421  AMMONIA 48*   Coagulation Profile: Recent Labs  Lab  03/23/21 0312  INR 1.2   Cardiac Enzymes: No results for input(s): CKTOTAL, CKMB, CKMBINDEX, TROPONINI in the last 168 hours. BNP (last 3 results) No results for input(s): PROBNP in the last 8760 hours. HbA1C: No results for input(s): HGBA1C in the last 72 hours. CBG: Recent Labs  Lab 03/24/21 0243 03/24/21 0430 03/24/21 0618 03/24/21 0805 03/24/21 1140  GLUCAP 163* 139* 163* 141* 187*   Lipid Profile: No results for input(s): CHOL, HDL, LDLCALC, TRIG, CHOLHDL, LDLDIRECT in the last 72 hours. Thyroid Function Tests: No results for input(s): TSH, T4TOTAL, FREET4, T3FREE, THYROIDAB in the last 72 hours. Anemia Panel: No results for input(s): VITAMINB12, FOLATE, FERRITIN, TIBC, IRON, RETICCTPCT in the last 72 hours. Sepsis Labs: Recent Labs  Lab 03/23/21 0312 03/23/21 0417 03/23/21 0822 03/23/21 1526 03/24/21 0320  PROCALCITON 6.48  --   --   --   --   LATICACIDVEN  --  6.9* 4.5* 4.6* 2.1*    Recent Results (from the past 240 hour(s))  Culture, blood (Routine X 2) w Reflex to ID Panel     Status: None (Preliminary result)   Collection Time: 04/07/21  7:42 PM   Specimen: BLOOD  Result Value Ref Range Status   Specimen Description   Final    BLOOD LEFT ANTECUBITAL Performed at [facility], [address], [place], [place] [address]    Special Requests   Final    BOTTLES DRAWN AEROBIC AND ANAEROBIC Blood Culture adequate volume Performed at [facility], [address] [place], [place] [address]    Culture   Final    NO GROWTH 2 DAYS Performed at [facility], [address], [place], [place] [address]    Report Status  PENDING  Incomplete  Urine Culture     Status: Abnormal   Collection Time: 2021/04/07  8:30 PM   Specimen: Urine, Random  Result Value Ref Range Status   Specimen Description   Final    URINE, RANDOM Performed at [facility], [address] [place], [place] [address]    Special  Requests   Final    NONE Performed at [facility], [address], [place], [place] [address]    Culture (A)  Final    70,000 COLONIES/mL LACTOBACILLUS SPECIES Standardized susceptibility testing for this organism is not available. Performed at [facility], [address] [place], [place] [address]    Report Status 03/24/2021 FINAL  Final  Resp Panel by RT-PCR (Flu A&B, Covid) Nasopharyngeal Swab     Status: None   Collection Time: Apr 07, 2021  8:54 PM   Specimen: Nasopharyngeal Swab; Nasopharyngeal(NP) swabs in vial transport medium  Result Value Ref Range Status   SARS Coronavirus 2 by RT PCR NEGATIVE NEGATIVE Final    Comment: (NOTE) SARS-CoV-2 target nucleic acids are NOT DETECTED.  The SARS-CoV-2 RNA is generally detectable in upper respiratory specimens during the acute phase of infection. The lowest concentration of SARS-CoV-2 viral copies this assay can detect is 138 copies/mL. A negative result does not preclude SARS-Cov-2 infection and should not be used as the sole basis for treatment or other patient management decisions. A negative result may occur with  improper specimen collection/handling, submission of specimen other than nasopharyngeal swab, presence of viral mutation(s) within the areas targeted by this assay, and inadequate number of viral copies(<138 copies/mL). A negative result must be combined with clinical observations, patient history, and epidemiological information. The expected result is Negative.  Fact Sheet for Patients:  BloggerCourse.com  Fact Sheet for Healthcare Providers:  SeriousBroker.it  This test is no t yet approved or cleared by the [place] FDA and  has been authorized for detection and/or diagnosis of SARS-CoV-2 by FDA under an Emergency Use Authorization (EUA). This EUA will remain  in effect (meaning this test can be used) for the duration of  the COVID-19 declaration under Section 564(b)(1) of the Act, 21 U.S.C.section 360bbb-3(b)(1), unless the authorization is terminated  or revoked sooner.       Influenza A by PCR NEGATIVE NEGATIVE Final   Influenza B by PCR NEGATIVE NEGATIVE Final    Comment: (NOTE) The Xpert Xpress SARS-CoV-2/FLU/RSV plus assay is intended as an aid in the diagnosis of influenza from Nasopharyngeal swab specimens and should not be used as a sole basis for treatment. Nasal washings and aspirates are unacceptable for Xpert Xpress SARS-CoV-2/FLU/RSV testing.  Fact Sheet for Patients: BloggerCourse.com  Fact Sheet for Healthcare Providers: SeriousBroker.it  This test is not yet approved or cleared by the [place] FDA and has been authorized for detection and/or diagnosis of SARS-CoV-2 by FDA under an Emergency Use Authorization (EUA). This EUA will remain in effect (meaning this test can be used) for the duration of the COVID-19 declaration under Section 564(b)(1) of the Act, 21 U.S.C. section 360bbb-3(b)(1), unless the authorization is terminated or revoked.  Performed at [facility], [address], [place], [place] [address]   Culture, blood (Routine X 2) w Reflex to ID Panel     Status: None (Preliminary result)   Collection Time: April 07, 2021 10:15 PM   Specimen: BLOOD  Result Value Ref Range Status   Specimen Description BLOOD BLOOD RIGHT FOREARM  Final  Special Requests   Final    BOTTLES DRAWN AEROBIC AND ANAEROBIC Blood Culture results may not be optimal due to an excessive volume of blood received in culture bottles   Culture   Final    NO GROWTH 1 DAY Performed at [facility], [address], [place], [place] [address]    Report Status PENDING  Incomplete  MRSA PCR Screening     Status: Abnormal   Collection Time: 03/23/21 12:15 AM  Result Value Ref Range Status   MRSA by PCR POSITIVE (A) NEGATIVE  Final    Comment:        The GeneXpert MRSA Assay (FDA approved for NASAL specimens only), is one component of a comprehensive MRSA colonization surveillance program. It is not intended to diagnose MRSA infection nor to guide or monitor treatment for MRSA infections. RESULT CALLED TO, READ BACK BY AND VERIFIED WITH: STACY, RN @ 0241 ON 03/23/21 Riesa Pope Performed at [facility], [address], [place], [place] [address]    Radiology Studies: DG Abd 1 View  Result Date: 03/23/2021 CLINICAL DATA:  NG tube placement EXAM: ABDOMEN - 1 VIEW COMPARISON:  None. FINDINGS: NG tube in the body of the stomach with the tip directed towards the fundus. Normal bowel gas pattern. IMPRESSION: Feeding tube tip in the proximal body of the stomach. Electronically Signed   By: Marlan Palau M.D.   On: 03/23/2021 12:12   CT Head Wo Contrast  Result Date: Mar 28, 2021 CLINICAL DATA:  Altered mental status. EXAM: CT HEAD WITHOUT CONTRAST TECHNIQUE: Contiguous axial images were obtained from the base of the skull through the vertex without intravenous contrast. COMPARISON:  01/11/2020 FINDINGS: Brain: No evidence of acute infarction, hemorrhage, hydrocephalus, extra-axial collection or mass lesion/mass effect. Ventricular and sulcal enlargement consistent with mild diffuse atrophy. 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 Patchy areas of white matter hypoattenuation are also noted consistent with moderate to advanced chronic microvascular ischemic change. Old right thalamic and left basal ganglia lacunar infarcts. Vascular: No hyperdense vessel or unexpected calcification. Skull: Normal. Negative for fracture or focal lesion. Sinuses/Orbits: Globes and orbits are unremarkable. Sinuses are clear. Other: None. IMPRESSION: 1. No acute intracranial abnormalities. 2. Old infarcts, moderate to advanced chronic microvascular ischemic change and mild atrophy, stable compared to the prior CT.  Electronically Signed   By: Amie Portland M.D.   On: 03-28-2021 20:20   US RENAL  Result Date: 03/23/2021 CLINICAL DATA:  Acute kidney injury EXAM: RENAL / URINARY TRACT ULTRASOUND COMPLETE COMPARISON:  CT scan 03/23/2021 FINDINGS: Right Kidney: Renal measurements: 10.7 x 5.3 x 5.4 cm = volume: 160 mL. Echogenicity within normal limits. No mass or hydronephrosis visualized. Left Kidney: Renal measurements: Obscured by bowel gas. Bladder: Decompressed by Foley catheter. Other: None. IMPRESSION: Limited study due to body habitus and patient condition. No evidence for hydronephrosis in the right kidney. Left kidney could not be visualized. Electronically Signed   By: Kennith Center M.D.   On: 03/23/2021 15:20   DG Chest Port 1 View  Result Date: March 28, 2021 CLINICAL DATA:  Cough. EXAM: PORTABLE CHEST 1 VIEW COMPARISON:  10/16/2020. FINDINGS: Normal heart size. Loop recorder is identified in the projection of the left heart. Bony structures appear intact. Pulmonary vascular congestion. No airspace opacities. No pleural effusion or frank pulmonary edema. IMPRESSION: Pulmonary vascular congestion. Electronically Signed   By: Signa Kell M.D.   On: Mar 28, 2021 19:38   CT CHEST ABDOMEN PELVIS WO CONTRAST  Result  Date: 03/23/2021 CLINICAL DATA:  Recurrent complicated UTI, severe sepsis, electrolyte abnormalities, history of type II diabetes mellitus, fatty liver, hypertension, multiple sclerosis EXAM: CT CHEST, ABDOMEN AND PELVIS WITHOUT CONTRAST TECHNIQUE: Multidetector CT imaging of the chest, abdomen and pelvis was performed following the standard protocol without IV contrast. Sagittal and coronal MPR images reconstructed from axial data set. COMPARISON:  CT chest 10/17/2020, CT abdomen and pelvis 09/16/2019 FINDINGS: CT CHEST FINDINGS Cardiovascular: Atherosclerotic calcifications aorta and proximal great vessels. Aorta normal caliber. Heart unremarkable. Mediastinum/Nodes: Nasogastric tube traverses  esophagus. No thoracic adenopathy. Base of cervical region normal appearance. Lungs/Pleura: Scattered respiratory motion artifacts. Bibasilar atelectasis greater on RIGHT. Tiny BILATERAL pleural effusions. Central peribronchial thickening. Minimal hazy infiltrate in the upper lungs. Questionable nodular density LEFT lower lobe 7 mm diameter image 77 not definitely seen previously but patient had more extensive infiltrates on the previous exam. Musculoskeletal: Scoliosis. CT ABDOMEN PELVIS FINDINGS Hepatobiliary: Vague gallstones within gallbladder, better visualized on prior exam. Streak artifacts from patient's arms traverse liver. No focal hepatic mass lesion. Pancreas: Atrophic pancreas Spleen: Normal appearance.  Small splenule. Adrenals/Urinary Tract: Adrenal glands, kidneys and ureters normal appearance. Bladder decompressed by Foley catheter. Stomach/Bowel: Normal appendix. Scattered colonic diverticulosis without evidence of diverticulitis. Increased stool in rectum with rectal wall thickening question stercoral colitis. Nasogastric tube in stomach. Stomach and bowel loops otherwise unremarkable. Vascular/Lymphatic: Atherosclerotic calcifications aorta without aneurysm. No adenopathy. Reproductive: Uterus surgically absent with nonvisualization of ovaries Other: No free air or free fluid. Scattered subcutaneous edema greatest at LEFT flank. Musculoskeletal: Osseous demineralization. Superior endplate compression deformities of L1 and L2 vertebral bodies, new. IMPRESSION: Increased stool in rectum with rectal wall thickening question stercoral colitis. Tiny BILATERAL pleural effusions with bibasilar atelectasis greater on RIGHT. Cholelithiasis. Colonic diverticulosis without evidence of diverticulitis. Superior endplate compression deformities of L1 and L2 vertebral bodies, new. Improved pulmonary infiltrates with persistent tiny pleural effusions and bibasilar atelectasis. Aortic Atherosclerosis  (ICD10-I70.0). Electronically Signed   By: Ulyses Southward M.D.   On: 03/23/2021 12:35    Scheduled Meds:  chlorhexidine  15 mL Mouth Rinse BID   Chlorhexidine Gluconate Cloth  6 each Topical Daily   feeding supplement (PROSource TF)  45 mL Per Tube BID   free water  30 mL Per Tube Q6H   heparin  5,000 Units Subcutaneous Q8H   mouth rinse  15 mL Mouth Rinse BID   mupirocin ointment  1 application Nasal BID   sodium bicarbonate  650 mg Oral TID   Continuous Infusions:  sodium chloride 10 mL/hr at 03/24/21 1356   cefTRIAXone (ROCEPHIN)  IV     dextrose 5 % and 0.9% NaCl 35 mL/hr at 03/24/21 1356   feeding supplement (OSMOLITE 1.5 CAL) 1,000 mL (03/24/21 0943)     LOS: 2 days    Time spent: 35 mins    Denise Bramblett, MD [facility]   If 7PM-7AM, please contact night-coverage

## 2021-03-24 NOTE — Procedures (Signed)
Arterial Catheter Insertion Procedure Note  Janice Little  [id number]  Feb 28, 1946  Date:03/24/21  Time:4:08 AM    Provider Performing: Rulon Eisenmenger    Procedure: Insertion of Arterial Line (32440) without US guidance  Indication(s) Blood pressure monitoring and/or need for frequent ABGs  Consent Unable to obtain consent due to emergent nature of procedure.  Anesthesia None   Time Out Verified patient identification, verified procedure, site/side was marked, verified correct patient position, special equipment/implants available, medications/allergies/relevant history reviewed, required imaging and test results available.   Sterile Technique Maximal sterile technique including full sterile barrier drape, hand hygiene, sterile gown, sterile gloves, mask, hair covering, sterile ultrasound probe cover (if used).   Procedure Description Area of catheter insertion was cleaned with chlorhexidine and draped in sterile fashion. Without real-time ultrasound guidance an arterial catheter was placed into the right radial artery.  Appropriate arterial tracings confirmed on monitor.     Complications/Tolerance None; patient tolerated the procedure well.   EBL Minimal   Specimen(s) None

## 2021-03-24 NOTE — Progress Notes (Signed)
  Echocardiogram 2D Echocardiogram with 3D and strain has been performed.  Leta Jungling M 03/24/2021, 11:22 AM

## 2021-03-24 NOTE — Progress Notes (Signed)
eLink Physician-Brief Progress Note Patient Name: Janice Little DOB: 1946-07-21 MRN: [id number]   Date of Service  03/24/2021  HPI/Events of Note  Hypomagnesemia  Hyperkalemia - Mg++ =1.3 and K+ = 5.5.  eICU Interventions  Plan: Replace Mg++. Continue Lokelma TID.     Intervention Category Major Interventions: Electrolyte abnormality - evaluation and management  Lanessa Shill [place] 03/24/2021, 5:24 AM

## 2021-03-24 NOTE — Progress Notes (Signed)
NAME:  Janice Little, MRN:  [id number], DOB:  [birth year], LOS: 2 ADMISSION DATE:  03/27/2021, CONSULTATION DATE:  03/23/2021 REFERRING MD:  ED, CHIEF COMPLAINT:  hypoglycemia   History of Present Illness:  20 year with baseline severe cognitive impairment lives at a facility presents with hypoglycemia. History per EMR. Can not obtain history via patient due to patient factors, encephalopathy.  Reportedly more lethargic last few days. Diarrhea began today. Noted to have low Bgs. Receives long acting IV insulin, most recently 6/19 PM. In ED, CXR clear on my review and interpretation. Satting 100% on baseline 2L [place]. Labs with leukocytosis, anemia a bit worse than last check during prior admission. K 7.2, Cr > 4. Bgs low. UA with negative LE, nitrites but with mild pyuria.  Admitted 10/2020 with covid. D/c summary reviewed. On 2L [place] since.   Pertinent  Medical History  Cognitive impairment, chronic respiratory failure (2L [place]), DM, HTN  Significant Hospital Events: Including procedures, antibiotic start and stop dates in addition to other pertinent events   6/20 admitted with ARF, hypoglycemia, hyperkalemia  Interim History / Subjective:   Relative hypotension overnight (wide pulse pressure) arterial catheter was placed and she was briefly on phenylephrine Lactic acid, potassium, serum creatinine all improving I/O+ 4.3 L total   Objective   Blood pressure 110/66, pulse (!) 102, temperature 98.42 F (36.9 C), resp. rate 16, weight 77.6 kg, SpO2 97 %.        Intake/Output Summary (Last 24 hours) at 03/24/2021 0720 Last data filed at 03/24/2021 6606 Gross per 24 hour  Intake 1716.76 ml  Output 1015 ml  Net 701.76 ml   Filed Weights   03/18/2021 2301 03/24/21 0500  Weight: 71.6 kg 77.6 kg    Examination: General: Chronically ill-appearing woman, no distress HENT: Oropharynx clear, somewhat dry, pupils equal, NG tube in place Lungs: Decreased at both bases, coarse with some upper  airway sounds but no wheezing or crackles.  No evidence respiratory distress 2 L/min Cardiovascular: Distant, regular, no murmur Abdomen: Nondistended, positive bowel sounds Extremities: Bilateral upper extremity edema, left greater than right Neuro: Eyes open to voice, turned her head to voice, can track.  Nonverbal.  Did not follow commands   Labs/imaging that I havepersonally reviewed  (right click and "Reselect all SmartList Selections" daily)   All studies and labs reviewed 6/22  Resolved Hospital Problem list   N/a  Assessment & Plan:  Hyperkalemia due to acute renal failure: Suspect due to hypovolemia in setting of diarrhea, poor PO intake. 800 cc UOP recorded in ED is encouraging. S/p shifting of K  in ED. tube -Improved with volume resuscitation, correction of acidosis and Lokelma. -Continue Lokelma as ordered and continue to follow serial labs, hopefully stop on 6/22  Hypoglycemia: Suspect related to insulin accumulation in the setting of renal failure; improving: -Home insulin is on hold.  She has received some bolus insulin to manage her hyperkalemia. -Fluids changed to D5 NS, current rate 35 cc/h -Follow CBG every 4 hours  Lactic Acidemia: Suspect largely related to hypovolemia. Possibly related to sepsis. CXR clear. UA w/o LE or nitrites but mild pyuria.  Blood culture negative so far.  Cleared on 6/22 -Continue support blood pressure.  Covered empirically currently for possible infection, no source identified.  Remains on cefepime, consider DC next few days if no source reveals itself  Acute renal failure: felt most likely due to hypovolemia. Possible component of sepsis -Appreciate nephrology consultation -Following BMP and urine output >  1015cc last 24h  Hx MS, Cognitive impairment: "sparse speech" noted last admission, was DNI last admission.  -will address code status further sister visits. She is appropriately DNI. Doubt she would survive or benefit from CPR so  would advocate full DNR status.   Toxic metabolic encephalopathy superimposed on above: Hypoglycemia, uremia, possible sepsis, baseline cognitive impairment. -Treating reversible causes as above  Best Practice (right click and "Reselect all SmartList Selections" daily)   Per primary Disposition: admit to progressive care      Labs   CBC: Recent Labs  Lab 03/28/2021 1848 03/23/21 0312  WBC 23.3* 17.4*  NEUTROABS 20.5*  --   HGB 7.8* 7.3*  HCT 26.3* 24.1*  MCV 79.0* 78.0*  PLT 473* 404*    Basic Metabolic Panel: Recent Labs  Lab 03/23/21 0312 03/23/21 0815 03/23/21 1156 03/23/21 1526 03/23/21 2020 03/24/21 0200 03/24/21 0320  NA 129* 125* 128*  --  128* 127*  --   K 6.6* 6.5* 6.2*  --  6.0* 5.5*  --   CL 97* 99 100  --  98 98  --   CO2 15* 15* 16*  --  15* 16*  --   GLUCOSE 94 231* 47*  --  148* 163*  --   BUN 83* 76* 74*  --  72* 72*  --   CREATININE 3.97* 3.84* 3.78*  --  3.51* 3.51*  --   CALCIUM 8.9 8.0* 8.3*  --  8.1* 8.0*  --   MG  --   --  1.2* 1.2*  --   --  1.2*  PHOS  --   --  3.7 3.7  --   --  3.3   GFR: Estimated Creatinine Clearance: 14 mL/min (A) (by C-G formula based on SCr of 3.51 mg/dL (H)). Recent Labs  Lab 2021/03/28 1848 03/23/21 0100 03/23/21 0312 03/23/21 0417 03/23/21 0822 03/23/21 1526 03/24/21 0320  PROCALCITON  --   --  6.48  --   --   --   --   WBC 23.3*  --  17.4*  --   --   --   --   LATICACIDVEN 7.9*   < >  --  6.9* 4.5* 4.6* 2.1*   < > = values in this interval not displayed.    Liver Function Tests: Recent Labs  Lab March 28, 2021 1848 03/23/21 0312  AST 21 22  ALT 16 13  ALKPHOS 56 56  BILITOT 0.3 0.4  PROT 6.1* 5.3*  ALBUMIN 2.9* 2.7*   No results for input(s): LIPASE, AMYLASE in the last 168 hours. Recent Labs  Lab 03/23/21 0421  AMMONIA 48*    ABG    Component Value Date/Time   PHART 7.331 (L) 03/24/2021 0309   PCO2ART 31.3 (L) 03/24/2021 0309   PO2ART 87.1 03/24/2021 0309   HCO3 15.9 (L) 03/24/2021 0309    ACIDBASEDEF 8.6 (H) 03/24/2021 0309   O2SAT 95.6 03/24/2021 0309     Coagulation Profile: Recent Labs  Lab 03/23/21 0312  INR 1.2    Cardiac Enzymes: No results for input(s): CKTOTAL, CKMB, CKMBINDEX, TROPONINI in the last 168 hours.  HbA1C: Hgb A1c MFr Bld  Date/Time Value Ref Range Status  03/01/2019 02:47 AM 5.6 4.8 - 5.6 % Final    Comment:    (NOTE) Pre diabetes:          5.7%-6.4% Diabetes:              >6.4% Glycemic control for   <7.0% adults with  diabetes     CBG: Recent Labs  Lab 03/24/21 0001 03/24/21 0116 03/24/21 0243 03/24/21 0430 03/24/21 0618  GLUCAP 210* 190* 163* 139* 163*     Critical care time: 31 minutes      Levy Pupa, MD, PhD 03/24/2021, 7:20 AM [facility] [phone] or if no answer before 7:00PM call [phone] For any issues after 7:00PM please call eLink [phone]

## 2021-03-24 NOTE — Progress Notes (Signed)
    BRIEF OVERNIGHT PROGRESS REPORT  Notified by RN of change in patient. On arrival E-Link was evaluating the patient, Remained in ICU for any further assistance or inquiry by E-Link or RN.   Chinita Greenland MSNA ACNPC-AG Acute Care Nurse Practitioner [facility]

## 2021-03-25 ENCOUNTER — Inpatient Hospital Stay (HOSPITAL_COMMUNITY): Payer: Medicare Other

## 2021-03-25 LAB — TYPE AND SCREEN
ABO/RH(D): O NEG
Antibody Screen: NEGATIVE
Unit division: 0

## 2021-03-25 LAB — PHOSPHORUS: Phosphorus: 3.7 mg/dL (ref 2.5–4.6)

## 2021-03-25 LAB — CBC
HCT: 29 % — ABNORMAL LOW (ref 36.0–46.0)
Hemoglobin: 9.4 g/dL — ABNORMAL LOW (ref 12.0–15.0)
MCH: 25.2 pg — ABNORMAL LOW (ref 26.0–34.0)
MCHC: 32.4 g/dL (ref 30.0–36.0)
MCV: 77.7 fL — ABNORMAL LOW (ref 80.0–100.0)
Platelets: 328 10*3/uL (ref 150–400)
RBC: 3.73 MIL/uL — ABNORMAL LOW (ref 3.87–5.11)
RDW: 22.5 % — ABNORMAL HIGH (ref 11.5–15.5)
WBC: 9.8 10*3/uL (ref 4.0–10.5)
nRBC: 0 % (ref 0.0–0.2)

## 2021-03-25 LAB — BASIC METABOLIC PANEL
Anion gap: 10 (ref 5–15)
BUN: 78 mg/dL — ABNORMAL HIGH (ref 8–23)
CO2: 20 mmol/L — ABNORMAL LOW (ref 22–32)
Calcium: 8 mg/dL — ABNORMAL LOW (ref 8.9–10.3)
Chloride: 100 mmol/L (ref 98–111)
Creatinine, Ser: 3.37 mg/dL — ABNORMAL HIGH (ref 0.44–1.00)
GFR, Estimated: 14 mL/min — ABNORMAL LOW (ref >60)
Glucose, Bld: 165 mg/dL — ABNORMAL HIGH (ref 70–99)
Potassium: 4.5 mmol/L (ref 3.5–5.1)
Sodium: 130 mmol/L — ABNORMAL LOW (ref 135–145)

## 2021-03-25 LAB — BPAM RBC
Blood Product Expiration Date: 202207142359
ISSUE DATE / TIME: 202206222020
Unit Type and Rh: 9500

## 2021-03-25 LAB — GLUCOSE, CAPILLARY
Glucose-Capillary: 139 mg/dL — ABNORMAL HIGH (ref 70–99)
Glucose-Capillary: 151 mg/dL — ABNORMAL HIGH (ref 70–99)
Glucose-Capillary: 192 mg/dL — ABNORMAL HIGH (ref 70–99)
Glucose-Capillary: 189 mg/dL — ABNORMAL HIGH (ref 70–99)
Glucose-Capillary: 201 mg/dL — ABNORMAL HIGH (ref 70–99)

## 2021-03-25 LAB — MAGNESIUM: Magnesium: 2.2 mg/dL (ref 1.7–2.4)

## 2021-03-25 NOTE — Progress Notes (Signed)
PCCM brief follow up  Hemodynamically improved, metabolic and electrolyte status improved with aggressive hydration. Unclear whether there is a source of infection.   PCCM will sign off. ? Whether she can move out of SDU.  Call if we can assist.   Levy Pupa, MD, PhD 03/25/2021, 7:22 AM [facility] [phone] or if no answer before 7:00PM call [phone] For any issues after 7:00PM please call eLink [phone]

## 2021-03-25 NOTE — Progress Notes (Signed)
Admit: March 25, 2021 LOS: 3  74M PMH including MS and chronic impaired cognition at SNF, Hypertension on ACE inhibitor presenting with AKI, hyperkalemia, progressive hyponatremia, normal anion gap metabolic acidosis  Subjective:  K stable SCr not much improved thus far but not worse either Sodium improving slowly  1L UOP yesterday  At baseline is somewhat verbal per report from RNs/SNF  06/22 0701 - 06/23 0700 In: 3780.5 [I.V.:533.7; Blood:335.4; KV/QQ:5956.3; IV Piggyback:325.1] Out: 905 [Urine:905]  Filed Weights   2021/03/25 2301 03/24/21 0500 03/25/21 0500  Weight: 71.6 kg 77.6 kg 76.9 kg    Scheduled Meds:  chlorhexidine  15 mL Mouth Rinse BID   Chlorhexidine Gluconate Cloth  6 each Topical Daily   feeding supplement (PROSource TF)  45 mL Per Tube BID   free water  30 mL Per Tube Q6H   heparin  5,000 Units Subcutaneous Q8H   mouth rinse  15 mL Mouth Rinse BID   mupirocin ointment  1 application Nasal BID   sodium bicarbonate  650 mg Oral TID   Continuous Infusions:  sodium chloride 999 mL/hr at 03/25/21 1504   cefTRIAXone (ROCEPHIN)  IV Stopped (03/24/21 2302)   feeding supplement (OSMOLITE 1.5 CAL) 55 mL/hr at 03/25/21 0850   PRN Meds:.acetaminophen **OR** acetaminophen, ondansetron **OR** ondansetron (ZOFRAN) IV  Current Labs: reviewed    Physical Exam:  Blood pressure (!) 101/36, pulse 99, temperature 99.14 F (37.3 C), resp. rate 15, weight 76.9 kg, SpO2 100 %. GEN: Chronically ill-appearing, NAD, not interactive, eyes open randomly ENT: NCAT, mild temporal wasting EYES: EOMI CV: Regular, normal S1 and S2 PULM: Clear bilaterally, normal work of breathing ABD: Soft, nontender, no masses or HSM SKIN: No rashes or lesions EXT: pitting edema  A Oliguric AKI: likely hypovolemia/ACEi +/- transition to ATN.   Hyperkalemia, severe, improved with med mgmt.   Likely related to 1, use of ACEi- now held Hypertension, on 3 agents, including ACE inhibitor, held at  presentation- now soft on no meds-  is edematous and would consider diuretics but tachycardic and BP soft, will hold off today  Progressive hyponatremia likely from excessive free water intake to try to correct hyperglycemia, slowly improving NAG metabolic acidosis likely related to #1- on oral bicarb Sepsis, likely UTI source, rocephin per TRH/CCM, cultures pending MS, impaired cognition chronically, at SNF History of surgical correction of primary hyperparathyroidism, calcium normal to low DM2, on metformin as outpatient Hypoglycemia, likely combination of relative insulin access and low GFR  P   Cont to limit free water I don't think needs more crystalloid at this poiont I don't think she is a short or long term RRT candidate given #7-  fortunately have not needed to go there   Sunny Gains [employer]  03/25/2021, 3:58 PM  Recent Labs  Lab 03/23/21 1526 03/23/21 2020 03/24/21 0320 03/24/21 0853 03/24/21 1412 03/25/21 0258  NA  --    < >  --  128* 129* 130*  K  --    < >  --  4.8 4.7 4.5  CL  --    < >  --  100 101 100  CO2  --    < >  --  16* 18* 20*  GLUCOSE  --    < >  --  156* 239* 165*  BUN  --    < >  --  76* 78* 78*  CREATININE  --    < >  --  3.57* 3.48* 3.37*  CALCIUM  --    < >  --  7.7* 7.8* 8.0*  PHOS 3.7  --  3.3  --   --  3.7   < > = values in this interval not displayed.   Recent Labs  Lab April 13, 2021 1848 03/23/21 0312 03/24/21 1614 03/25/21 0258  WBC 23.3* 17.4*  --  9.8  NEUTROABS 20.5*  --   --   --   HGB 7.8* 7.3* 6.9* 9.4*  HCT 26.3* 24.1* 22.2* 29.0*  MCV 79.0* 78.0*  --  77.7*  PLT 473* 404*  --  328

## 2021-03-25 NOTE — Progress Notes (Signed)
PROGRESS NOTE    Janice Little  WUJ:[id number] DOB: [birth year] DOA: 04/04/21 PCP: Juluis Rainier, MD   Brief Narrative:  This 75 y.o. female with PMH significant of diabetes, chronic kidney disease stage III, chronic pain syndrome, chronic respiratory failure secondary to COPD, multiple sclerosis, GERD, essential hypertension and primary hyperparathyroidism s/p surgery who presents to the ED with hypoglycemia by EMS.  Patient apparently started having diarrhea  at [facility].  Her blood glucose was noted to be 34 then 70 and 25.  She was given glucagon x 4 . She has not received any insulin at SNF.  When patient arrived the ER she appeared drowsy, unable to give any history.  Her initial blood sugar was 46.  She was given D10 and blood sugar went to 130.  She is so lethargic this not much history was obtained.  Patient was noted to be in acute kidney failure with creatinine more than 4.0  It was previously normal. She was found to have hyperkalemia as well as lactic acidosis which has resolved.  Suspected pyuria and severe sepsis.  Her blood pressure is however stable.  She is hypoxic but has chronically been on 2 L of oxygen.   She is being admitted as a case of severe sepsis with multiple electrolyte abnormalities.  Critical care consulted and will follow with [place].   Assessment & Plan:   Principal Problem:   Severe sepsis (HCC) Active Problems:   Type 2 diabetes mellitus with hypoglycemia without coma (HCC)   Hyperlipidemia   Multiple sclerosis (HCC)   Essential hypertension   GERD   Acute lower UTI   Acute metabolic encephalopathy   AKI (acute kidney injury) (HCC)   Hyperkalemia   Hyponatremia   Hypoglycemia   Pressure ulcer of left leg  Severe sepsis presumed to be secondary to the UTI: Patient presented with tachycardia, tachypnea, hypotension,  urinary retention, elevated lactic acid. UA: LE - Nitrite -, Many bacteria Started on empiric antibiotics vancomycin and  cefepime(MRSA nares+) Follow blood and urine cultures Continue IV hydration.  Hyperkalemia  : Resolved Could be due to acute renal failure.  Due to dehydration in the setting of diarrhea Treated with calcium gluconate, Dextrose, Insulin and Lokelma,  potassium has improved.  Acute Kidney injury : Multifactorial: Patient presented with serum creatinine up to 4.3,  Baseline creatinine normal. This could be multifactorial in the setting of sepsis and dehydration. Nephrology is following recommended to continue IV hydration. Avoid nephrotoxic medications, creatinine trending down 3.38  Suspected stercoral colitis: Seen on CT scan.  Continue glycerin suppository   Hypoglycemia : Resolved. Could be due to insulin accumulation in the setting of acute renal failure which is improving. Hold home insulin.  Blood glucose is improving.   Hyponatremia:  Due to dehydration,  continue to monitor. Continue gentle IV hydration.   History of multiple sclerosis : Stable  History of CVA:  Continue home regimen when she is stable.  Acute metabolic encephalopathy could be secondary to acute illness: Continue current management.  Insulin-dependent diabetes:  Hold all insulin and her metformin due to hypoglycemia.   Monitor capillary blood glucose closely.   Essential hypertension: Hold blood pressure medication in the setting of sepsis.  GERD : resume PPI  Chronic pain syndrome: Resume Cymbalta and gabapentin  Left leg decubitus ulcer - local wound care  Microcytic hypochromic anemia: Hemoglobin dropped from 7.2- 6.9.  s/p 1 PRBC hemoglobin 9.4.   There is no visible signs of any obvious bleeding.  Continue to monitor H&H   DVT prophylaxis: Heparin Code Status: Full code. Family Communication: No family at bed side. Disposition Plan:   Status is: Inpatient  Remains inpatient appropriate because:Inpatient level of care appropriate due to severity of illness  Dispo: The patient  is from: SNF              Anticipated d/c is to: SNF              Patient currently is not medically stable to d/c.   Difficult to place patient No  Consultants:  Nephrology  Procedures:  Antimicrobials:   Anti-infectives (From admission, onward)    Start     Dose/Rate Route Frequency Ordered Stop   03/24/21 2200  cefTRIAXone (ROCEPHIN) 2 g in sodium chloride 0.9 % 100 mL IVPB        2 g 200 mL/hr over 30 Minutes Intravenous Every 24 hours 03/24/21 0936     03/23/21 2200  cefTRIAXone (ROCEPHIN) 1 g in sodium chloride 0.9 % 100 mL IVPB  Status:  Discontinued        1 g 200 mL/hr over 30 Minutes Intravenous Every 24 hours 03/23/21 0022 03/23/21 0916   03/23/21 2200  ceFEPIme (MAXIPIME) 2 g in sodium chloride 0.9 % 100 mL IVPB  Status:  Discontinued        2 g 200 mL/hr over 30 Minutes Intravenous Every 24 hours 03/23/21 0945 03/24/21 0936   03/23/21 0145  ceFEPIme (MAXIPIME) 2 g in sodium chloride 0.9 % 100 mL IVPB  Status:  Discontinued        2 g 200 mL/hr over 30 Minutes Intravenous  Once 03/23/21 0045 03/23/21 0046   Apr 01, 2021 2130  ceFEPIme (MAXIPIME) 2 g in sodium chloride 0.9 % 100 mL IVPB        2 g 200 mL/hr over 30 Minutes Intravenous  Once April 01, 2021 2121 03/23/21 0046   2021-04-01 2130  metroNIDAZOLE (FLAGYL) IVPB 500 mg        500 mg 100 mL/hr over 60 Minutes Intravenous  Once 04/01/2021 2121 03/23/21 0130   04/01/2021 2130  vancomycin (VANCOCIN) IVPB 1000 mg/200 mL premix        1,000 mg 200 mL/hr over 60 Minutes Intravenous  Once 01-Apr-2021 2121 03/23/21 0210        Subjective: Patient is seen and examined at bedside.  Overnight events noted. She is chronically ill looking, she is nonverbal, appears lethargic today,  RN reports that she has received 2 units PRBC last night due to anemia.  Objective: Vitals:   03/25/21 0230 03/25/21 0245 03/25/21 0500 03/25/21 0800  BP: (!) 123/52 (!) 119/57  (!) 160/84  Pulse: 95 92  96  Resp: 19 (!) 24  10  Temp: 97.7 F (36.5 C)  (!) 97.5 F (36.4 C)  (!) 96.98 F (36.1 C)  TempSrc:      SpO2: 98% 98%  100%  Weight:   76.9 kg     Intake/Output Summary (Last 24 hours) at 03/25/2021 1406 Last data filed at 03/25/2021 0523 Gross per 24 hour  Intake 1912.59 ml  Output 655 ml  Net 1257.59 ml   Filed Weights   01-Apr-2021 2301 03/24/21 0500 03/25/21 0500  Weight: 71.6 kg 77.6 kg 76.9 kg    Examination:  General exam: Appears calm and comfortable , does not appear in any acute distress. Respiratory system: Mild baseline crackles on auscultation. Respiratory effort normal. Cardiovascular system: S1 & S2 heard, RRR. No JVD, murmurs,  rubs, gallops or clicks. No pedal edema. Gastrointestinal system: Abdomen is nondistended, soft and nontender. No organomegaly or masses felt. Normal bowel sounds heard. Central nervous system: Lethargic, sleepy . No focal neurological deficits. Extremities: No edema, no cyanosis, no clubbing. Skin: No rashes, lesions or ulcers Psychiatry: not assessed.    Data Reviewed: I have personally reviewed following labs and imaging studies  CBC: Recent Labs  Lab 2021/04/06 1848 03/23/21 0312 03/24/21 1614 03/25/21 0258  WBC 23.3* 17.4*  --  9.8  NEUTROABS 20.5*  --   --   --   HGB 7.8* 7.3* 6.9* 9.4*  HCT 26.3* 24.1* 22.2* 29.0*  MCV 79.0* 78.0*  --  77.7*  PLT 473* 404*  --  328   Basic Metabolic Panel: Recent Labs  Lab 03/23/21 1156 03/23/21 1526 03/23/21 2020 03/24/21 0200 03/24/21 0320 03/24/21 0853 03/24/21 1412 03/25/21 0258  NA 128*  --  128* 127*  --  128* 129* 130*  K 6.2*  --  6.0* 5.5*  --  4.8 4.7 4.5  CL 100  --  98 98  --  100 101 100  CO2 16*  --  15* 16*  --  16* 18* 20*  GLUCOSE 47*  --  148* 163*  --  156* 239* 165*  BUN 74*  --  72* 72*  --  76* 78* 78*  CREATININE 3.78*  --  3.51* 3.51*  --  3.57* 3.48* 3.37*  CALCIUM 8.3*  --  8.1* 8.0*  --  7.7* 7.8* 8.0*  MG 1.2* 1.2*  --   --  1.2*  --   --  2.2  PHOS 3.7 3.7  --   --  3.3  --   --  3.7    GFR: Estimated Creatinine Clearance: 14.6 mL/min (A) (by C-G formula based on SCr of 3.37 mg/dL (H)). Liver Function Tests: Recent Labs  Lab April 06, 2021 1848 03/23/21 0312  AST 21 22  ALT 16 13  ALKPHOS 56 56  BILITOT 0.3 0.4  PROT 6.1* 5.3*  ALBUMIN 2.9* 2.7*   No results for input(s): LIPASE, AMYLASE in the last 168 hours. Recent Labs  Lab 03/23/21 0421  AMMONIA 48*   Coagulation Profile: Recent Labs  Lab 03/23/21 0312  INR 1.2   Cardiac Enzymes: No results for input(s): CKTOTAL, CKMB, CKMBINDEX, TROPONINI in the last 168 hours. BNP (last 3 results) No results for input(s): PROBNP in the last 8760 hours. HbA1C: No results for input(s): HGBA1C in the last 72 hours. CBG: Recent Labs  Lab 03/24/21 1934 03/24/21 2310 03/25/21 0438 03/25/21 0757 03/25/21 1150  GLUCAP 220* 224* 139* 151* 192*   Lipid Profile: No results for input(s): CHOL, HDL, LDLCALC, TRIG, CHOLHDL, LDLDIRECT in the last 72 hours. Thyroid Function Tests: No results for input(s): TSH, T4TOTAL, FREET4, T3FREE, THYROIDAB in the last 72 hours. Anemia Panel: No results for input(s): VITAMINB12, FOLATE, FERRITIN, TIBC, IRON, RETICCTPCT in the last 72 hours. Sepsis Labs: Recent Labs  Lab 03/23/21 0312 03/23/21 0417 03/23/21 0822 03/23/21 1526 03/24/21 0320  PROCALCITON 6.48  --   --   --   --   LATICACIDVEN  --  6.9* 4.5* 4.6* 2.1*    Recent Results (from the past 240 hour(s))  Culture, blood (Routine X 2) w Reflex to ID Panel     Status: None (Preliminary result)   Collection Time: Apr 06, 2021  7:42 PM   Specimen: BLOOD  Result Value Ref Range Status   Specimen Description   Final  BLOOD LEFT ANTECUBITAL Performed at [facility], [address] [place], [place] [address]    Special Requests   Final    BOTTLES DRAWN AEROBIC AND ANAEROBIC Blood Culture adequate volume Performed at [facility], [address] [place], [place] [address]    Culture    Final    NO GROWTH 3 DAYS Performed at [facility], [address], [place], [place] [address]    Report Status PENDING  Incomplete  Urine Culture     Status: Abnormal   Collection Time: 03/31/2021  8:30 PM   Specimen: Urine, Random  Result Value Ref Range Status   Specimen Description   Final    URINE, RANDOM Performed at [facility], [address] [place], [place] [address]    Special Requests   Final    NONE Performed at [facility], [address] [place], [place] [address]    Culture (A)  Final    70,000 COLONIES/mL LACTOBACILLUS SPECIES Standardized susceptibility testing for this organism is not available. Performed at [facility], [address], [place], [place] [address]    Report Status 03/24/2021 FINAL  Final  Resp Panel by RT-PCR (Flu A&B, Covid) Nasopharyngeal Swab     Status: None   Collection Time: 03/13/2021  8:54 PM   Specimen: Nasopharyngeal Swab; Nasopharyngeal(NP) swabs in vial transport medium  Result Value Ref Range Status   SARS Coronavirus 2 by RT PCR NEGATIVE NEGATIVE Final    Comment: (NOTE) SARS-CoV-2 target nucleic acids are NOT DETECTED.  The SARS-CoV-2 RNA is generally detectable in upper respiratory specimens during the acute phase of infection. The lowest concentration of SARS-CoV-2 viral copies this assay can detect is 138 copies/mL. A negative result does not preclude SARS-Cov-2 infection and should not be used as the sole basis for treatment or other patient management decisions. A negative result may occur with  improper specimen collection/handling, submission of specimen other than nasopharyngeal swab, presence of viral mutation(s) within the areas targeted by this assay, and inadequate number of viral copies(<138 copies/mL). A negative result must be combined with clinical observations, patient history, and epidemiological information. The expected result is Negative.  Fact Sheet  for Patients:  BloggerCourse.com  Fact Sheet for Healthcare Providers:  SeriousBroker.it  This test is no t yet approved or cleared by the [place] FDA and  has been authorized for detection and/or diagnosis of SARS-CoV-2 by FDA under an Emergency Use Authorization (EUA). This EUA will remain  in effect (meaning this test can be used) for the duration of the COVID-19 declaration under Section 564(b)(1) of the Act, 21 U.S.C.section 360bbb-3(b)(1), unless the authorization is terminated  or revoked sooner.       Influenza A by PCR NEGATIVE NEGATIVE Final   Influenza B by PCR NEGATIVE NEGATIVE Final    Comment: (NOTE) The Xpert Xpress SARS-CoV-2/FLU/RSV plus assay is intended as an aid in the diagnosis of influenza from Nasopharyngeal swab specimens and should not be used as a sole basis for treatment. Nasal washings and aspirates are unacceptable for Xpert Xpress SARS-CoV-2/FLU/RSV testing.  Fact Sheet for Patients: BloggerCourse.com  Fact Sheet for Healthcare Providers: SeriousBroker.it  This test is not yet approved or cleared by the [place] FDA and has been authorized for detection and/or diagnosis of SARS-CoV-2 by FDA under an Emergency Use Authorization (EUA). This EUA will remain in effect (meaning this test can be used) for the duration of the  COVID-19 declaration under Section 564(b)(1) of the Act, 21 U.S.C. section 360bbb-3(b)(1), unless the authorization is terminated or revoked.  Performed at [facility], [address], [place], [place] [address]   Culture, blood (Routine X 2) w Reflex to ID Panel     Status: None (Preliminary result)   Collection Time: 03/23/2021 10:15 PM   Specimen: BLOOD  Result Value Ref Range Status   Specimen Description BLOOD BLOOD RIGHT FOREARM  Final   Special Requests   Final    BOTTLES DRAWN AEROBIC AND  ANAEROBIC Blood Culture results may not be optimal due to an excessive volume of blood received in culture bottles   Culture   Final    NO GROWTH 2 DAYS Performed at [facility], [address] [place], [place] [address]    Report Status PENDING  Incomplete  MRSA PCR Screening     Status: Abnormal   Collection Time: 03/23/21 12:15 AM  Result Value Ref Range Status   MRSA by PCR POSITIVE (A) NEGATIVE Final    Comment:        The GeneXpert MRSA Assay (FDA approved for NASAL specimens only), is one component of a comprehensive MRSA colonization surveillance program. It is not intended to diagnose MRSA infection nor to guide or monitor treatment for MRSA infections. RESULT CALLED TO, READ BACK BY AND VERIFIED WITH: STACY, RN @ 0241 ON 03/23/21 Riesa Pope Performed at [facility], [address], [place], [place] [address]    Radiology Studies: US RENAL  Result Date: 03/23/2021 CLINICAL DATA:  Acute kidney injury EXAM: RENAL / URINARY TRACT ULTRASOUND COMPLETE COMPARISON:  CT scan 03/23/2021 FINDINGS: Right Kidney: Renal measurements: 10.7 x 5.3 x 5.4 cm = volume: 160 mL. Echogenicity within normal limits. No mass or hydronephrosis visualized. Left Kidney: Renal measurements: Obscured by bowel gas. Bladder: Decompressed by Foley catheter. Other: None. IMPRESSION: Limited study due to body habitus and patient condition. No evidence for hydronephrosis in the right kidney. Left kidney could not be visualized. Electronically Signed   By: Kennith Center M.D.   On: 03/23/2021 15:20   ECHOCARDIOGRAM COMPLETE  Result Date: 03/24/2021    ECHOCARDIOGRAM REPORT   Patient Name:   CHRISTAIN MCRANEY Date of Exam: 03/24/2021 Medical Rec #:  [id number]          Height:       65.0 in Accession #:    [id number]         Weight:       171.1 lb Date of Birth:  08/22/1946          BSA:          1.851 m Patient Age:    74 years           BP:           110/66 mmHg Patient Gender: F                   HR:           99 bpm. Exam Location:  Inpatient Procedure: 2D Echo, 3D Echo, Cardiac Doppler, Color Doppler and Strain Analysis Indications:    CHF-Acute Diastolic I50.31  History:        Patient has prior history of Echocardiogram examinations, most                 recent 03/01/2019. Risk Factors:Diabetes and Hypertension.  Cognitive impairment, chronic respiratory failure.  Sonographer:    Leta Junglingiffany Cooper RDCS Referring Phys: [id number] Lowella PettiesJAMES K DANIELS IMPRESSIONS  1. Left ventricular ejection fraction, by estimation, is 70 to 75%. The left ventricle has hyperdynamic function. The left ventricle has no regional wall motion abnormalities. There is moderate asymmetric left ventricular hypertrophy. Left ventricular diastolic parameters are consistent with Grade I diastolic dysfunction (impaired relaxation). Elevated left ventricular end-diastolic pressure. The average left ventricular global longitudinal strain is -19.5 %. The global longitudinal strain is normal.  2. Right ventricular systolic function is normal. The right ventricular size is normal.  3. The mitral valve is normal in structure. No evidence of mitral valve regurgitation. No evidence of mitral stenosis.  4. The aortic valve is tricuspid. There is mild calcification of the aortic valve. Aortic valve regurgitation is not visualized. No aortic stenosis is present.  5. The inferior vena cava is normal in size with greater than 50% respiratory variability, suggesting right atrial pressure of 3 mmHg. FINDINGS  Left Ventricle: Left ventricular ejection fraction, by estimation, is 70 to 75%. The left ventricle has hyperdynamic function. The left ventricle has no regional wall motion abnormalities. The average left ventricular global longitudinal strain is -19.5  %. The global longitudinal strain is normal. The left ventricular internal cavity size was normal in size. There is moderate asymmetric left ventricular hypertrophy. Left ventricular  diastolic parameters are consistent with Grade I diastolic dysfunction  (impaired relaxation). Elevated left ventricular end-diastolic pressure. Right Ventricle: The right ventricular size is normal. No increase in right ventricular wall thickness. Right ventricular systolic function is normal. Left Atrium: Left atrial size was normal in size. Right Atrium: Right atrial size was normal in size. Pericardium: There is no evidence of pericardial effusion. Mitral Valve: The mitral valve is normal in structure. Mild mitral annular calcification. No evidence of mitral valve regurgitation. No evidence of mitral valve stenosis. MV peak gradient, 11.7 mmHg. The mean mitral valve gradient is 5.0 mmHg. Tricuspid Valve: The tricuspid valve is normal in structure. Tricuspid valve regurgitation is trivial. No evidence of tricuspid stenosis. Aortic Valve: The aortic valve is tricuspid. There is mild calcification of the aortic valve. Aortic valve regurgitation is not visualized. No aortic stenosis is present. Pulmonic Valve: The pulmonic valve was normal in structure. Pulmonic valve regurgitation is not visualized. No evidence of pulmonic stenosis. Aorta: The aortic root is normal in size and structure. Venous: The inferior vena cava is normal in size with greater than 50% respiratory variability, suggesting right atrial pressure of 3 mmHg. IAS/Shunts: No atrial level shunt detected by color flow Doppler.  LEFT VENTRICLE PLAX 2D LVIDd:         4.40 cm  Diastology LVIDs:         2.30 cm  LV e' medial:    6.65 cm/s LV PW:         0.90 cm  LV E/e' medial:  17.0 LV IVS:        0.90 cm  LV e' lateral:   9.28 cm/s LVOT diam:     1.90 cm  LV E/e' lateral: 12.2 LV SV:         45 LV SV Index:   24       2D Longitudinal Strain LVOT Area:     2.84 cm 2D Strain GLS (A2C):   -18.8 %  2D Strain GLS (A3C):   -20.9 %                         2D Strain GLS (A4C):   -18.9 %                         2D Strain GLS Avg:     -19.5  % RIGHT VENTRICLE RV S prime:     24.90 cm/s TAPSE (M-mode): 1.4 cm LEFT ATRIUM             Index       RIGHT ATRIUM           Index LA diam:        3.30 cm 1.78 cm/m  RA Area:     10.40 cm LA Vol (A2C):   31.0 ml 16.75 ml/m RA Volume:   18.30 ml  9.89 ml/m LA Vol (A4C):   26.2 ml 14.15 ml/m LA Biplane Vol: 28.8 ml 15.56 ml/m  AORTIC VALVE LVOT Vmax:   111.00 cm/s LVOT Vmean:  67.100 cm/s LVOT VTI:    0.158 m  AORTA Ao Root diam: 2.60 cm Ao Asc diam:  2.90 cm MITRAL VALVE                TRICUSPID VALVE MV Area (PHT): 3.60 cm     TR Peak grad:   23.2 mmHg MV Area VTI:   1.40 cm     TR Vmax:        241.00 cm/s MV Peak grad:  11.7 mmHg MV Mean grad:  5.0 mmHg     SHUNTS MV Vmax:       1.71 m/s     Systemic VTI:  0.16 m MV Vmean:      106.3 cm/s   Systemic Diam: 1.90 cm MV Decel Time: 211 msec MV E velocity: 113.00 cm/s MV A velocity: 176.40 cm/s MV E/A ratio:  0.64 Chilton Si MD Electronically signed by Chilton Si MD Signature Date/Time: 03/24/2021/4:05:45 PM    Final     Scheduled Meds:  chlorhexidine  15 mL Mouth Rinse BID   Chlorhexidine Gluconate Cloth  6 each Topical Daily   feeding supplement (PROSource TF)  45 mL Per Tube BID   free water  30 mL Per Tube Q6H   heparin  5,000 Units Subcutaneous Q8H   mouth rinse  15 mL Mouth Rinse BID   mupirocin ointment  1 application Nasal BID   sodium bicarbonate  650 mg Oral TID   Continuous Infusions:  sodium chloride 10 mL/hr at 03/25/21 0523   cefTRIAXone (ROCEPHIN)  IV Stopped (03/24/21 2302)   feeding supplement (OSMOLITE 1.5 CAL) 55 mL/hr at 03/25/21 0850     LOS: 3 days    Time spent: 35 mins    Nandini Bogdanski, MD [facility]   If 7PM-7AM, please contact night-coverage

## 2021-03-26 ENCOUNTER — Inpatient Hospital Stay (HOSPITAL_COMMUNITY): Payer: Medicare Other

## 2021-03-26 LAB — CBC
HCT: 26 % — ABNORMAL LOW (ref 36.0–46.0)
Hemoglobin: 8.3 g/dL — ABNORMAL LOW (ref 12.0–15.0)
MCH: 25.8 pg — ABNORMAL LOW (ref 26.0–34.0)
MCHC: 31.9 g/dL (ref 30.0–36.0)
MCV: 80.7 fL (ref 80.0–100.0)
Platelets: 278 10*3/uL (ref 150–400)
RBC: 3.22 MIL/uL — ABNORMAL LOW (ref 3.87–5.11)
RDW: 23.2 % — ABNORMAL HIGH (ref 11.5–15.5)
WBC: 8.1 10*3/uL (ref 4.0–10.5)
nRBC: 0 % (ref 0.0–0.2)

## 2021-03-26 LAB — BLOOD GAS, ARTERIAL
Acid-base deficit: 3.1 mmol/L — ABNORMAL HIGH (ref 0.0–2.0)
Bicarbonate: 21.4 mmol/L (ref 20.0–28.0)
Drawn by: 270211
FIO2: 28
O2 Content: 2 L/min
O2 Saturation: 98.1 %
Patient temperature: 98.6
pCO2 arterial: 38.3 mmHg (ref 32.0–48.0)
pH, Arterial: 7.365 (ref 7.350–7.450)
pO2, Arterial: 118 mmHg — ABNORMAL HIGH (ref 83.0–108.0)

## 2021-03-26 LAB — POTASSIUM: Potassium: 5.5 mmol/L — ABNORMAL HIGH (ref 3.5–5.1)

## 2021-03-26 LAB — GLUCOSE, CAPILLARY
Glucose-Capillary: 105 mg/dL — ABNORMAL HIGH (ref 70–99)
Glucose-Capillary: 106 mg/dL — ABNORMAL HIGH (ref 70–99)
Glucose-Capillary: 120 mg/dL — ABNORMAL HIGH (ref 70–99)
Glucose-Capillary: 162 mg/dL — ABNORMAL HIGH (ref 70–99)
Glucose-Capillary: 179 mg/dL — ABNORMAL HIGH (ref 70–99)
Glucose-Capillary: 192 mg/dL — ABNORMAL HIGH (ref 70–99)
Glucose-Capillary: 198 mg/dL — ABNORMAL HIGH (ref 70–99)

## 2021-03-26 LAB — BASIC METABOLIC PANEL
Anion gap: 10 (ref 5–15)
BUN: 90 mg/dL — ABNORMAL HIGH (ref 8–23)
CO2: 21 mmol/L — ABNORMAL LOW (ref 22–32)
Calcium: 8.2 mg/dL — ABNORMAL LOW (ref 8.9–10.3)
Chloride: 103 mmol/L (ref 98–111)
Creatinine, Ser: 3.28 mg/dL — ABNORMAL HIGH (ref 0.44–1.00)
GFR, Estimated: 14 mL/min — ABNORMAL LOW (ref >60)
Glucose, Bld: 201 mg/dL — ABNORMAL HIGH (ref 70–99)
Potassium: 5.4 mmol/L — ABNORMAL HIGH (ref 3.5–5.1)
Sodium: 134 mmol/L — ABNORMAL LOW (ref 135–145)

## 2021-03-26 LAB — AMMONIA: Ammonia: 29 umol/L (ref 9–35)

## 2021-03-26 LAB — TSH: TSH: 1.655 u[IU]/mL (ref 0.350–4.500)

## 2021-03-26 LAB — VITAMIN B12: Vitamin B-12: 187 pg/mL (ref 180–914)

## 2021-03-26 LAB — PHOSPHORUS: Phosphorus: 3.3 mg/dL (ref 2.5–4.6)

## 2021-03-26 LAB — MAGNESIUM: Magnesium: 2.1 mg/dL (ref 1.7–2.4)

## 2021-03-26 MED ORDER — SODIUM ZIRCONIUM CYCLOSILICATE 10 G PO PACK
10.0000 g | PACK | Freq: Once | ORAL | Status: AC
  Administered 2021-03-26: 10 g via ORAL
  Filled 2021-03-26: qty 1

## 2021-03-26 MED ORDER — HYDRALAZINE HCL 20 MG/ML IJ SOLN
5.0000 mg | Freq: Once | INTRAMUSCULAR | Status: AC
  Administered 2021-03-26: 5 mg via INTRAVENOUS

## 2021-03-26 MED ORDER — FUROSEMIDE 10 MG/ML IJ SOLN
40.0000 mg | Freq: Two times a day (BID) | INTRAMUSCULAR | Status: DC
  Administered 2021-03-26 – 2021-03-30 (×10): 40 mg via INTRAVENOUS
  Filled 2021-03-26 (×10): qty 4

## 2021-03-26 MED ORDER — HYDRALAZINE HCL 20 MG/ML IJ SOLN
5.0000 mg | Freq: Four times a day (QID) | INTRAMUSCULAR | Status: DC | PRN
Start: 2021-03-26 — End: 2021-03-27
  Administered 2021-03-26 – 2021-03-27 (×2): 5 mg via INTRAVENOUS
  Filled 2021-03-26 (×3): qty 1

## 2021-03-26 NOTE — Procedures (Signed)
Patient Name: Janice Little  MRN: [id number]  Epilepsy Attending: Charlsie Quest  Referring Physician/Provider: Dr Erick Blinks Date: 03/26/2021 Duration: 50.14 mins  Patient history: 75 year old female with history of MS and worsening mental status.  EEG to evaluate for seizures.  Level of alertness:  comatose  AEDs during EEG study: None  Technical aspects: This EEG was obtained using a 10 lead EEG system positioned circumferentially without any parasagittal coverage (rapid EEG). Computer selected EEG is reviewed as well as background features and all clinically significant events.  Description: No posterior dominant rhythm was seen.  EEG showed continuous generalized and lateralized right hemisphere 3 to 5 Hz theta-delta slowing.  Hyperventilation and photic stimulation were not performed.     ABNORMALITY - Continuous slow, generalized and lateralized right hemisphere  IMPRESSION: This study is suggestive of cortical dysfunction in right hemisphere likely secondary to underlying structural abnormality/stroke.  Additionally there is evidence of moderate degree of encephalopathy, nonspecific etiology.  No seizures or epileptiform discharges were seen throughout the recording.   If suspicion for interictal activity remains a concern, a prolonged study with traditional eeg can be considered.    [name] Annabelle Harman

## 2021-03-26 NOTE — Progress Notes (Signed)
Admit: 03/30/2021 LOS: 4  8M PMH including MS and chronic impaired cognition at SNF, Hypertension on ACE inhibitor presenting with AKI, hyperkalemia, progressive hyponatremia, normal anion gap metabolic acidosis  Subjective:  K up this AM SCr not much improved thus far but not worse either-  BUN climbing Sodium improving slowly  800 UOP yesterday Pt still essentially obtunded   At baseline is somewhat verbal per report from RNs/SNF  06/23 0701 - 06/24 0700 In: 2164.6 [I.V.:820.9; NG/GT:1243.9; IV Piggyback:99.8] Out: 800 [Urine:800]  Filed Weights   03/21/2021 2301 03/24/21 0500 03/25/21 0500  Weight: 71.6 kg 77.6 kg 76.9 kg    Scheduled Meds:  chlorhexidine  15 mL Mouth Rinse BID   Chlorhexidine Gluconate Cloth  6 each Topical Daily   feeding supplement (PROSource TF)  45 mL Per Tube BID   free water  30 mL Per Tube Q6H   heparin  5,000 Units Subcutaneous Q8H   mouth rinse  15 mL Mouth Rinse BID   mupirocin ointment  1 application Nasal BID   sodium bicarbonate  650 mg Oral TID   Continuous Infusions:  sodium chloride 10 mL/hr at 03/26/21 1233   cefTRIAXone (ROCEPHIN)  IV Stopped (03/25/21 2204)   feeding supplement (OSMOLITE 1.5 CAL) 55 mL/hr at 03/25/21 0850   PRN Meds:.acetaminophen **OR** acetaminophen, ondansetron **OR** ondansetron (ZOFRAN) IV  Current Labs: reviewed    Physical Exam:  Blood pressure (!) 157/56, pulse 97, temperature 98.6 F (37 C), resp. rate 19, weight 76.9 kg, SpO2 99 %. GEN: Chronically ill-appearing, NAD, not interactive, eyes open randomly ENT: NCAT, mild temporal wasting EYES: EOMI CV: Regular, normal S1 and S2 PULM: Clear bilaterally, normal work of breathing ABD: Soft, nontender, no masses or HSM SKIN: No rashes or lesions EXT: pitting edema  A Oliguric AKI: likely hypovolemia/ACEi +/- transition to ATN.   Hyperkalemia, severe, improved with med mgmt.   Likely related to 1, use of ACEi- now held.   K starting to creep up again-   given lokelma today -  I am going to add on lasix-  already on bicarb  Hypertension, on 3 agents, including ACE inhibitor, held at presentation- now better on no meds-  is edematous-  will go ahead and add on diuretics today-  may help with the K as well  Progressive hyponatremia likely from excessive free water intake to try to correct hyperglycemia, slowly improving NAG metabolic acidosis likely related to #1- on oral bicarb Sepsis, likely UTI source, rocephin per TRH/CCM, cultures pending-  WBC improved but she clinically has not MS, impaired cognition chronically, at SNF-  not getting any better-  BUN is likely not helping  History of surgical correction of primary hyperparathyroidism, calcium normal to low DM2, on metformin as outpatient Hypoglycemia, likely combination of relative insulin access and low GFR  P    I don't think she is a short or long term RRT candidate given #7-  fortunately have not needed to go there.  I would support palliative care involvement of GOC discussionCecille Aver  03/26/2021, 1:41 PM  Recent Labs  Lab 03/24/21 0320 03/24/21 0853 03/24/21 1412 03/25/21 0258 03/26/21 0256 03/26/21 0831  NA  --    < > 129* 130* 134*  --   K  --    < > 4.7 4.5 5.4* 5.5*  CL  --    < > 101 100 103  --   CO2  --    < >  18* 20* 21*  --   GLUCOSE  --    < > 239* 165* 201*  --   BUN  --    < > 78* 78* 90*  --   CREATININE  --    < > 3.48* 3.37* 3.28*  --   CALCIUM  --    < > 7.8* 8.0* 8.2*  --   PHOS 3.3  --   --  3.7 3.3  --    < > = values in this interval not displayed.   Recent Labs  Lab 03/27/2021 1848 03/23/21 0312 03/24/21 1614 03/25/21 0258 03/26/21 0256  WBC 23.3* 17.4*  --  9.8 8.1  NEUTROABS 20.5*  --   --   --   --   HGB 7.8* 7.3* 6.9* 9.4* 8.3*  HCT 26.3* 24.1* 22.2* 29.0* 26.0*  MCV 79.0* 78.0*  --  77.7* 80.7  PLT 473* 404*  --  328 278

## 2021-03-26 NOTE — Progress Notes (Signed)
NAME:  Janice Little, MRN:  [id number], DOB:  1946-03-26, LOS: 4 ADMISSION DATE:  03/24/2021, CONSULTATION DATE:  03/23/2021 REFERRING MD:  ED, CHIEF COMPLAINT:  hypoglycemia   History of Present Illness:  57 year with baseline severe cognitive impairment lives at a facility presents with hypoglycemia. History per EMR. Can not obtain history via patient due to patient factors, encephalopathy.  Reportedly more lethargic last few days. Diarrhea began today. Noted to have low Bgs. Receives long acting IV insulin, most recently 6/19 PM. In ED, CXR clear on my review and interpretation. Satting 100% on baseline 2L [place]. Labs with leukocytosis, anemia a bit worse than last check during prior admission. K 7.2, Cr > 4. Bgs low. UA with negative LE, nitrites but with mild pyuria.  Admitted 10/2020 with covid. D/c summary reviewed. On 2L [place] since.   Pertinent  Medical History  Cognitive impairment, chronic respiratory failure (2L [place]), DM, HTN  Significant Hospital Events: Including procedures, antibiotic start and stop dates in addition to other pertinent events   6/20 admitted with ARF, hypoglycemia, hyperkalemia 6/21-6/23 seen by nephro and CCM. Hyperkalemia felt d/t renal failure which was felt 2/2 volume depletion from diarrhea and poor intake, and ace-I. Marland Kitchen All improved w/ volume resuscitation and Lokelma for hyperkalemia. The lactic acid cleared w/ volume. Remained on Cefepime but no culture data positive w/ exception of Urinary culture w/ lactobacillus. Critical care signed off as BP better and metabolic derangements improved on 6/23.  Nephro limiting free water.  6/24 PCCM asked to see again given on-going encephalopathy "looking worse than prior day" to IM service.   Interim History / Subjective:  PCCM asked to see again to weigh in on encephalopathy   Objective   Blood pressure (Abnormal) 150/55, pulse (Abnormal) 108, temperature 99.32 F (37.4 C), resp. rate 17, weight 76.9 kg, SpO2 98 %.         Intake/Output Summary (Last 24 hours) at 03/26/2021 0938 Last data filed at 03/26/2021 2549 Gross per 24 hour  Intake 2215.79 ml  Output 800 ml  Net 1415.79 ml   Filed Weights   03/28/2021 2301 03/24/21 0500 03/25/21 0500  Weight: 71.6 kg 77.6 kg 76.9 kg    Examination: General chronically ill obese white female lying in bed HENT MMM no JVD> PERRL Pulm clear dec bases  Card rrr Abd soft Ext warm diffuse anasarca brisk CR Neuro awakens to voice. Limited movement. Moans when staff try to reposition her. Can't follow commands Gu cl yellow   Labs/imaging that I havepersonally reviewed  (right click and "Reselect all SmartList Selections" daily)   All studies and labs reviewed 6/22  Resolved Hospital Problem list   hypoglycemia Lactic acidosis  Assessment & Plan:  AKI in setting of volume depletion and ace-I +/- transition to ATN w/ Hyperkalemia and Hyponatremia Acute Toxic and Metabolic encephalopathy super imposed on baseline dementia/cognitive impairment from her MS  Severe deconditioning Protein calorie malnutrition Possible aspiration event (6/24) Type II DM Hyperglycemia (initially hypoglycemia) HL UTI w/ sepsis  Pressure ulcer left leg H/o CVA H/o HTN Chronic pain    ICU prob list   AKI in setting of volume depletion and ace-I +/- transition to ATN w/ Hyperkalemia and Hyponatremia -nephrology following. Her Na is normalizing. Her cr has improved but BUN climbing.  Plan Keep euvolemic Would escalate serial chemistries. Need to be sure she doesn't get volume depleted again  Cont to hold Ace-I Strict I&O   Acute Toxic and Metabolic encephalopathy super  imposed on baseline dementia/cognitive impairment from her MS  -can't speak to baseline status but I do think that this is likely all driven by her metabolic derangements and renal failure (note BUN climbing). Also consider subclinical seizure (but she woke up briskly for me and moaned which reportedly is  baseline) -abg reviewed; unlikely source as CO2 nml also ammonia nml.  Plan Supportive care Eeg (spoke w/ primary team) No sedation  She is on chronic Neurontin and cymbalta, both are being held. People can w/d from neurontin but doubt this is what we are seeing here. Would not resume given her renal fxn   Nothing further to offer. Reasonable to have neuro weigh in but suspect they will come to similar conclusion.  We will s/o again    Best Practice (right click and "Reselect all SmartList Selections" daily)  Per primary   Simonne Martinet ACNP-BC [facility] Pulmonary/Critical Care Pager # [phone] OR # [phone] if no answer

## 2021-03-26 NOTE — Progress Notes (Signed)
Pt diabetic type 2 with no insulin coverage. Dr. Lucianne Muss was notified. Dr. Lucianne Muss says pt does not need insulin coverage unless CBG 200 & greater. This nurse will continue to monitor.

## 2021-03-26 NOTE — Progress Notes (Addendum)
PROGRESS NOTE    Janice Little  ZOX:[id number] DOB: 1946-02-13 DOA: 03/24/2021 PCP: Juluis Rainier, MD   Brief Narrative:  This 75 y.o. female with PMH significant of diabetes, chronic kidney disease stage III, chronic pain syndrome, chronic respiratory failure secondary to COPD, multiple sclerosis, GERD, essential hypertension and primary hyperparathyroidism s/p surgery who presents to the ED with hypoglycemia by EMS.  Patient apparently started having diarrhea  at [facility].  Her blood glucose was noted to be 34 then 70 and 25.  She was given glucagon x 4 . She has not received any insulin at SNF.  When patient arrived the ER she appeared drowsy, unable to give any history.  Her initial blood sugar was 46.  She was given D10 and blood sugar went to 130.  She is so lethargic this not much history was obtained.  Patient was noted to be in acute kidney failure with creatinine more than 4.0  It was previously normal. She was found to have hyperkalemia as well as lactic acidosis which has resolved.  Suspected pyuria and severe sepsis.  Her blood pressure is however stable.  She is hypoxic but has chronically been on 2 L of oxygen.   She is being admitted as a case of severe sepsis with multiple electrolyte abnormalities.  Critical care , Nephrology and Neurology is consulted.   Assessment & Plan:   Principal Problem:   Severe sepsis (HCC) Active Problems:   Type 2 diabetes mellitus with hypoglycemia without coma (HCC)   Hyperlipidemia   Multiple sclerosis (HCC)   Essential hypertension   GERD   Acute lower UTI   Acute metabolic encephalopathy   AKI (acute kidney injury) (HCC)   Hyperkalemia   Hyponatremia   Hypoglycemia   Pressure ulcer of left leg  Severe sepsis presumed to be secondary to the UTI: Patient presented with tachycardia, tachypnea, hypotension,  urinary retention, elevated lactic acid. UA: LE - Nitrite -, Many bacteria Started on empiric antibiotics vancomycin and  cefepime(MRSA nares+) Blood culture: No growth so far, urine cultures grew lactobacillus. Discontinue IV hydration.  Lactic acid> improved. Continue ceftriaxone for UTI.  Hyperkalemia  : Could be due to acute renal failure.  Due to dehydration in the setting of diarrhea Treated with calcium gluconate, Dextrose, Insulin and Lokelma,  potassium has improved. K+ 5.5 , Lokelma given x 1, continue to monitor.  Acute Kidney injury : Multifactorial: Patient presented with serum creatinine up to 4.3,  Baseline creatinine normal. This could be multifactorial in the setting of sepsis and dehydration. Nephrology is following , creatinine is improving, stable Avoid nephrotoxic medications, creatinine trending down 3.28  Suspected stercoral colitis: Seen on CT scan.  Continue glycerin suppository   Hypoglycemia : Resolved. Could be due to insulin accumulation in the setting of acute renal failure which is improving. Hold home insulin.  Blood glucose is improving.   Hyponatremia:  Due to dehydration,  continue to monitor. Continue gentle IV hydration.   History of multiple sclerosis : Stable.  History of CVA:  Continue home regimen when she is stable.  Acute metabolic encephalopathy could be secondary to acute illness: She continues to remain lethargic, ammonia level normal, ABG unremarkable. Neurology consulted,  awaiting recommendation. EEG to rule out subclinical seizures. Palliative care consulted to discuss goals of care.  Insulin-dependent diabetes:  Hold all insulin and her metformin due to hypoglycemia.   Monitor capillary blood glucose closely.   Essential hypertension: Hold blood pressure medication in the setting of sepsis.  GERD : resume PPI  Chronic pain syndrome: Resume Cymbalta and gabapentin  Left leg decubitus ulcer - local wound care  Microcytic hypochromic anemia: Hemoglobin dropped from 7.2- 6.9.  s/p 1 PRBC hemoglobin 9.4> 8.3   There is no visible signs  of any obvious bleeding.  Continue to monitor H&H   DVT prophylaxis: Heparin Code Status: Full code. Family Communication: No family at bed side. Disposition Plan:   Status is: Inpatient  Remains inpatient appropriate because:Inpatient level of care appropriate due to severity of illness  Dispo: The patient is from: SNF              Anticipated d/c is to: SNF              Patient currently is not medically stable to d/c.   Difficult to place patient No  Consultants:  Nephrology Neurology PCCM  Procedures:  Antimicrobials:   Anti-infectives (From admission, onward)    Start     Dose/Rate Route Frequency Ordered Stop   03/24/21 2200  cefTRIAXone (ROCEPHIN) 2 g in sodium chloride 0.9 % 100 mL IVPB        2 g 200 mL/hr over 30 Minutes Intravenous Every 24 hours 03/24/21 0936     03/23/21 2200  cefTRIAXone (ROCEPHIN) 1 g in sodium chloride 0.9 % 100 mL IVPB  Status:  Discontinued        1 g 200 mL/hr over 30 Minutes Intravenous Every 24 hours 03/23/21 0022 03/23/21 0916   03/23/21 2200  ceFEPIme (MAXIPIME) 2 g in sodium chloride 0.9 % 100 mL IVPB  Status:  Discontinued        2 g 200 mL/hr over 30 Minutes Intravenous Every 24 hours 03/23/21 0945 03/24/21 0936   03/23/21 0145  ceFEPIme (MAXIPIME) 2 g in sodium chloride 0.9 % 100 mL IVPB  Status:  Discontinued        2 g 200 mL/hr over 30 Minutes Intravenous  Once 03/23/21 0045 03/23/21 0046   07/14/2021 2130  ceFEPIme (MAXIPIME) 2 g in sodium chloride 0.9 % 100 mL IVPB        2 g 200 mL/hr over 30 Minutes Intravenous  Once 07/14/2021 2121 03/23/21 0046   07/14/2021 2130  metroNIDAZOLE (FLAGYL) IVPB 500 mg        500 mg 100 mL/hr over 60 Minutes Intravenous  Once 07/14/2021 2121 03/23/21 0130   07/14/2021 2130  vancomycin (VANCOCIN) IVPB 1000 mg/200 mL premix        1,000 mg 200 mL/hr over 60 Minutes Intravenous  Once 07/14/2021 2121 03/23/21 0210        Subjective: Patient was seen and examined at bedside.  Overnight events  noted. She is chronically ill looking, she is nonverbal, appears lethargic but opens eyes on painful stimuli. RN reports she opens eyes sometimes, she is pretty swollen has significant bruises noted on the right forearm.  Objective: Vitals:   03/26/21 0956 03/26/21 0957 03/26/21 1000 03/26/21 1100  BP:   (!) 161/59 (!) 141/79  Pulse: (!) 102 (!) 102 100 100  Resp: 15 17 10 11   Temp: 98.8 F (37.1 C) 98.8 F (37.1 C) 98.78 F (37.1 C) 98.6 F (37 C)  TempSrc:      SpO2: 99% 98% 99% 100%  Weight:        Intake/Output Summary (Last 24 hours) at 03/26/2021 1158 Last data filed at 03/26/2021 1112 Gross per 24 hour  Intake 2233.13 ml  Output 800 ml  Net 1433.13 ml  Filed Weights   03/07/2021 2301 03/24/21 0500 03/25/21 0500  Weight: 71.6 kg 77.6 kg 76.9 kg    Examination:  General exam: Appears calm and comfortable , does not appear in any acute distress. Respiratory system: Mild baseline crackles on auscultation. Respiratory effort normal. Cardiovascular system: S1 & S2 heard, RRR. No JVD, murmurs, rubs, gallops or clicks. No pedal edema. Gastrointestinal system: Abdomen is nondistended, soft and nontender. No organomegaly or masses felt. Normal bowel sounds heard. Central nervous system: Lethargic, sleepy . No focal neurological deficits. Extremities: No edema, no cyanosis, no clubbing. Skin: No rashes, lesions or ulcers Psychiatry: not assessed.    Data Reviewed: I have personally reviewed following labs and imaging studies  CBC: Recent Labs  Lab 03/13/2021 1848 03/23/21 0312 03/24/21 1614 03/25/21 0258 03/26/21 0256  WBC 23.3* 17.4*  --  9.8 8.1  NEUTROABS 20.5*  --   --   --   --   HGB 7.8* 7.3* 6.9* 9.4* 8.3*  HCT 26.3* 24.1* 22.2* 29.0* 26.0*  MCV 79.0* 78.0*  --  77.7* 80.7  PLT 473* 404*  --  328 278   Basic Metabolic Panel: Recent Labs  Lab 03/23/21 1156 03/23/21 1526 03/23/21 2020 03/24/21 0200 03/24/21 0320 03/24/21 0853 03/24/21 1412  03/25/21 0258 03/26/21 0256 03/26/21 0831  NA 128*  --    < > 127*  --  128* 129* 130* 134*  --   K 6.2*  --    < > 5.5*  --  4.8 4.7 4.5 5.4* 5.5*  CL 100  --    < > 98  --  100 101 100 103  --   CO2 16*  --    < > 16*  --  16* 18* 20* 21*  --   GLUCOSE 47*  --    < > 163*  --  156* 239* 165* 201*  --   BUN 74*  --    < > 72*  --  76* 78* 78* 90*  --   CREATININE 3.78*  --    < > 3.51*  --  3.57* 3.48* 3.37* 3.28*  --   CALCIUM 8.3*  --    < > 8.0*  --  7.7* 7.8* 8.0* 8.2*  --   MG 1.2* 1.2*  --   --  1.2*  --   --  2.2 2.1  --   PHOS 3.7 3.7  --   --  3.3  --   --  3.7 3.3  --    < > = values in this interval not displayed.   GFR: Estimated Creatinine Clearance: 15 mL/min (A) (by C-G formula based on SCr of 3.28 mg/dL (H)). Liver Function Tests: Recent Labs  Lab 03/19/2021 1848 03/23/21 0312  AST 21 22  ALT 16 13  ALKPHOS 56 56  BILITOT 0.3 0.4  PROT 6.1* 5.3*  ALBUMIN 2.9* 2.7*   No results for input(s): LIPASE, AMYLASE in the last 168 hours. Recent Labs  Lab 03/23/21 0421 03/26/21 0831  AMMONIA 48* 29   Coagulation Profile: Recent Labs  Lab 03/23/21 0312  INR 1.2   Cardiac Enzymes: No results for input(s): CKTOTAL, CKMB, CKMBINDEX, TROPONINI in the last 168 hours. BNP (last 3 results) No results for input(s): PROBNP in the last 8760 hours. HbA1C: No results for input(s): HGBA1C in the last 72 hours. CBG: Recent Labs  Lab 03/25/21 2018 03/26/21 0000 03/26/21 0339 03/26/21 0751 03/26/21 1140  GLUCAP 201* 198* 192* 179* 162*  Lipid Profile: No results for input(s): CHOL, HDL, LDLCALC, TRIG, CHOLHDL, LDLDIRECT in the last 72 hours. Thyroid Function Tests: No results for input(s): TSH, T4TOTAL, FREET4, T3FREE, THYROIDAB in the last 72 hours. Anemia Panel: No results for input(s): VITAMINB12, FOLATE, FERRITIN, TIBC, IRON, RETICCTPCT in the last 72 hours. Sepsis Labs: Recent Labs  Lab 03/23/21 0312 03/23/21 0417 03/23/21 0822 03/23/21 1526  03/24/21 0320  PROCALCITON 6.48  --   --   --   --   LATICACIDVEN  --  6.9* 4.5* 4.6* 2.1*    Recent Results (from the past 240 hour(s))  Culture, blood (Routine X 2) w Reflex to ID Panel     Status: None (Preliminary result)   Collection Time: 04-21-21  7:42 PM   Specimen: BLOOD  Result Value Ref Range Status   Specimen Description   Final    BLOOD LEFT ANTECUBITAL Performed at [facility], [address] [place], [place] [address]    Special Requests   Final    BOTTLES DRAWN AEROBIC AND ANAEROBIC Blood Culture adequate volume Performed at [facility], [address] [place], [place] [address]    Culture   Final    NO GROWTH 3 DAYS Performed at [facility], [address], [place], [place] [address]    Report Status PENDING  Incomplete  Urine Culture     Status: Abnormal   Collection Time: April 21, 2021  8:30 PM   Specimen: Urine, Random  Result Value Ref Range Status   Specimen Description   Final    URINE, RANDOM Performed at [facility], [address] [place], [place] [address]    Special Requests   Final    NONE Performed at [facility], [address], [place], [place] [address]    Culture (A)  Final    70,000 COLONIES/mL LACTOBACILLUS SPECIES Standardized susceptibility testing for this organism is not available. Performed at [facility], [address] [place], [place] [address]    Report Status 03/24/2021 FINAL  Final  Resp Panel by RT-PCR (Flu A&B, Covid) Nasopharyngeal Swab     Status: None   Collection Time: 04-21-21  8:54 PM   Specimen: Nasopharyngeal Swab; Nasopharyngeal(NP) swabs in vial transport medium  Result Value Ref Range Status   SARS Coronavirus 2 by RT PCR NEGATIVE NEGATIVE Final    Comment: (NOTE) SARS-CoV-2 target nucleic acids are NOT DETECTED.  The SARS-CoV-2 RNA is generally detectable in upper respiratory specimens during the acute phase of  infection. The lowest concentration of SARS-CoV-2 viral copies this assay can detect is 138 copies/mL. A negative result does not preclude SARS-Cov-2 infection and should not be used as the sole basis for treatment or other patient management decisions. A negative result may occur with  improper specimen collection/handling, submission of specimen other than nasopharyngeal swab, presence of viral mutation(s) within the areas targeted by this assay, and inadequate number of viral copies(<138 copies/mL). A negative result must be combined with clinical observations, patient history, and epidemiological information. The expected result is Negative.  Fact Sheet for Patients:  BloggerCourse.com  Fact Sheet for Healthcare Providers:  SeriousBroker.it  This test is no t yet approved or cleared by the [place] FDA and  has been authorized for detection and/or diagnosis of SARS-CoV-2 by FDA under an Emergency Use Authorization (EUA). This EUA will remain  in effect (meaning this test can be used) for the duration of the COVID-19 declaration under  Section 564(b)(1) of the Act, 21 U.S.C.section 360bbb-3(b)(1), unless the authorization is terminated  or revoked sooner.       Influenza A by PCR NEGATIVE NEGATIVE Final   Influenza B by PCR NEGATIVE NEGATIVE Final    Comment: (NOTE) The Xpert Xpress SARS-CoV-2/FLU/RSV plus assay is intended as an aid in the diagnosis of influenza from Nasopharyngeal swab specimens and should not be used as a sole basis for treatment. Nasal washings and aspirates are unacceptable for Xpert Xpress SARS-CoV-2/FLU/RSV testing.  Fact Sheet for Patients: BloggerCourse.com  Fact Sheet for Healthcare Providers: SeriousBroker.it  This test is not yet approved or cleared by the [place] FDA and has been authorized for detection and/or diagnosis of SARS-CoV-2  by FDA under an Emergency Use Authorization (EUA). This EUA will remain in effect (meaning this test can be used) for the duration of the COVID-19 declaration under Section 564(b)(1) of the Act, 21 U.S.C. section 360bbb-3(b)(1), unless the authorization is terminated or revoked.  Performed at [facility], [address], [place], [place] [address]   Culture, blood (Routine X 2) w Reflex to ID Panel     Status: None (Preliminary result)   Collection Time: 28-Mar-2021 10:15 PM   Specimen: BLOOD  Result Value Ref Range Status   Specimen Description BLOOD BLOOD RIGHT FOREARM  Final   Special Requests   Final    BOTTLES DRAWN AEROBIC AND ANAEROBIC Blood Culture results may not be optimal due to an excessive volume of blood received in culture bottles   Culture   Final    NO GROWTH 2 DAYS Performed at [facility], [address], [place], [place] [address]    Report Status PENDING  Incomplete  MRSA PCR Screening     Status: Abnormal   Collection Time: 03/23/21 12:15 AM  Result Value Ref Range Status   MRSA by PCR POSITIVE (A) NEGATIVE Final    Comment:        The GeneXpert MRSA Assay (FDA approved for NASAL specimens only), is one component of a comprehensive MRSA colonization surveillance program. It is not intended to diagnose MRSA infection nor to guide or monitor treatment for MRSA infections. RESULT CALLED TO, READ BACK BY AND VERIFIED WITH: STACY, RN @ 0241 ON 03/23/21 Riesa Pope Performed at [facility], [address] [place], [place] [address]    Radiology Studies: DG CHEST PORT 1 VIEW  Result Date: 03/26/2021 CLINICAL DATA:  Aspiration EXAM: PORTABLE CHEST 1 VIEW COMPARISON:  03/25/2021 FINDINGS: Lungs are clear. No pneumothorax or pleural effusion. Nasogastric tube extends into the upper abdomen beyond the margin of the examination. Cardiac size within normal limits. Implanted loop recorder again noted. Pulmonary vascularity  is normal. No acute bone abnormality. IMPRESSION: Nasogastric tube in appropriate position. No focal pulmonary infiltrate. Electronically Signed   By: Helyn Numbers MD   On: 03/26/2021 05:34   DG CHEST PORT 1 VIEW  Result Date: 03/25/2021 CLINICAL DATA:  Possible pneumonia EXAM: PORTABLE CHEST 1 VIEW COMPARISON:  03/23/2021 FINDINGS: Cardiac shadow is within normal limits. Gastric catheter is noted within the stomach. Loop recorder is again seen. Lungs are well aerated bilaterally. Persistent vascular congestion is noted. No focal confluent infiltrate is seen. No bony abnormality is noted. IMPRESSION: Persistent vascular congestion.  No new focal abnormality is seen. Gastric catheter within the stomach. Electronically Signed   By: Alcide Clever M.D.   On: 03/25/2021 19:01    Scheduled Meds:  chlorhexidine  15  mL Mouth Rinse BID   Chlorhexidine Gluconate Cloth  6 each Topical Daily   feeding supplement (PROSource TF)  45 mL Per Tube BID   free water  30 mL Per Tube Q6H   heparin  5,000 Units Subcutaneous Q8H   mouth rinse  15 mL Mouth Rinse BID   mupirocin ointment  1 application Nasal BID   sodium bicarbonate  650 mg Oral TID   sodium zirconium cyclosilicate  10 g Oral Once   Continuous Infusions:  sodium chloride 10 mL/hr at 03/26/21 1112   cefTRIAXone (ROCEPHIN)  IV Stopped (03/25/21 2204)   feeding supplement (OSMOLITE 1.5 CAL) 55 mL/hr at 03/25/21 0850     LOS: 4 days    Time spent: 35 mins    Emylie Amster, MD [facility]   If 7PM-7AM, please contact night-coverage

## 2021-03-26 NOTE — Consult Note (Signed)
Neurology Consultation  Reason for Consult: Encephalopathy Referring Physician: Casper Harrison., MD  CC: encephalopathy  History is obtained from: chart.  HPI: Janice Little is a 75 y.o. female with a PMHx of cognitive decline, worse since COVID infection, chronic respiratory failure on O2 at 2L, COPD, severe PTM, DM II, MS, HT, HLD, CVA, CKD III, GERD, osteopenia, hyperparathyroidism s/p parathyroidectomy, and chronic pain. Patient was admitted 3 days ago from SNF for lethargy, hypoglycemia, and diarrhea. She was mostly unresponsive on admission. Workup revealed hypoglycemia, AKI on CKD (?), lactic acidosis, pyuria, sepsis, and multiple metabolic derangements. Acute encephalopathy was felt to be secondary to acute illness.   In review of chart, patient has consistently been lethargic, non verbal and not following commands since admission. Groaning is the most verbal response noted.   She is s/p transfusion due to anemia of 6.9 Hgb here, now up to 8.3. Ammonia level 29. PCO2 normal. K 5.5 today down from 7. Her creatinine remains very elevated at 3.28 with a baseline creatinine of .57 in 10/2020. Her BUN is climbing. Nephrology is on the case.   Neurology asked to consult for encephalopathy.   ROS: Unable to obtain due to altered mental status.   Past Medical History:  Diagnosis Date   Chronic kidney disease (CKD), stage II (mild)    Chronic pain    Chronic respiratory failure (HCC)    DM type 2 (diabetes mellitus, type 2) (HCC)    Dysphagia, oropharyngeal phase    Fatty liver    per CT   GERD (gastroesophageal reflux disease)    History of hepatitis as a child    age 33 (jaundice)   HTN (hypertension)    Hyperlipidemia    Monoplegia of upper limb (HCC)    MS (multiple sclerosis) (HCC)    Obesity    Osteopenia    Primary hyperparathyroidism (HCC)    s/p surgery removal of parathyroid (Dr. Sharl Ma, Dr. Gerrit Friends)   Vitamin D deficiency    Family History  Problem Relation Age of Onset    Mental illness Mother    COPD Father    Lung cancer Father     Social History:   reports that she has never smoked. She has never used smokeless tobacco. She reports that she does not drink alcohol and does not use drugs.Lives at [facility] SN[facility].   Medications  Current Facility-Administered Medications:    0.9 %  sodium chloride infusion, 250 mL, Intravenous, Continuous, Karl Ito, MD, Last Rate: 10 mL/hr at 03/26/21 1233, Infusion Verify at 03/26/21 1233   acetaminophen (TYLENOL) tablet 650 mg, 650 mg, Oral, Q6H PRN, 650 mg at 03/24/21 0133 **OR** acetaminophen (TYLENOL) suppository 650 mg, 650 mg, Rectal, Q6H PRN, Mikeal Hawthorne, Mohammad L, MD   cefTRIAXone (ROCEPHIN) 2 g in sodium chloride 0.9 % 100 mL IVPB, 2 g, Intravenous, Q24H, Cipriano Bunker, MD, Stopped at 03/25/21 2204   chlorhexidine (PERIDEX) 0.12 % solution 15 mL, 15 mL, Mouth Rinse, BID, Garba, Mohammad L, MD, 15 mL at 03/26/21 0949   Chlorhexidine Gluconate Cloth 2 % PADS 6 each, 6 each, Topical, Daily, Mikeal Hawthorne, Mohammad L, MD, 6 each at 03/26/21 0400   feeding supplement (OSMOLITE 1.5 CAL) liquid 1,000 mL, 1,000 mL, Per Tube, Continuous, Wouk, Wilfred Curtis, MD, Last Rate: 55 mL/hr at 03/25/21 0850, Restarted at 03/25/21 0850   feeding supplement (PROSource TF) liquid 45 mL, 45 mL, Per Tube, BID, Wouk, Wilfred Curtis, MD, 45 mL at 03/26/21 0948   free water 30  mL, 30 mL, Per Tube, Q6H, Wouk, Wilfred Curtis, MD, 30 mL at 03/26/21 1216   heparin injection 5,000 Units, 5,000 Units, Subcutaneous, Q8H, Earlie Lou L, MD, 5,000 Units at 03/26/21 0518   MEDLINE mouth rinse, 15 mL, Mouth Rinse, BID, Mikeal Hawthorne, Mohammad L, MD, 15 mL at 03/26/21 0800   mupirocin ointment (BACTROBAN) 2 % 1 application, 1 application, Nasal, BID, Rometta Emery, MD, 1 application at 03/26/21 0948   ondansetron (ZOFRAN) tablet 4 mg, 4 mg, Oral, Q6H PRN **OR** ondansetron (ZOFRAN) injection 4 mg, 4 mg, Intravenous, Q6H PRN, Mikeal Hawthorne, Mohammad L, MD   sodium  bicarbonate tablet 650 mg, 650 mg, Oral, TID, Cipriano Bunker, MD, 650 mg at 03/26/21 0948   Exam: Current vital signs: BP (!) 157/56 (BP Location: Right Arm)   Pulse 97   Temp 98.6 F (37 C)   Resp 19   Wt 76.9 kg   SpO2 99%   BMI 28.21 kg/m  Vital signs in last 24 hours: Temp:  [98.6 F (37 C)-100.04 F (37.8 C)] 98.6 F (37 C) (06/24 1200) Pulse Rate:  [97-110] 97 (06/24 1200) Resp:  [7-31] 19 (06/24 1200) BP: (94-164)/(34-80) 157/56 (06/24 1200) SpO2:  [93 %-100 %] 99 % (06/24 1200)  PE: GENERAL: Very ill appearing. Opens eyes with tactile stimulation.  HEENT: - Normocephalic and atraumatic. LUNGS - Normal respiratory effort.  CV - RRR  ABDOMEN - Soft, nontender. Ext: warm, well perfused. Anasarca with dark pink marks on arms, left over right.  Psych: lethargic.   GCS:   Best verbal   2   Best motor  2    Best eye 3   Total: 7  NEURO:  Mental Status: Awakens to tactile stimulation and to name calling. Does not follow commands. Speech/Language: She moans with tactile stimulation or passive movement of any extremity or head.  Cranial Nerves:  II: PERRL 19mm/brisk. Does not track examiner. Blinks to threat and resists passive eye opening.  III, IV, VI: EOMI. Lid elevation symmetric and full.  V, VII: Face is grossly symmetrical at rest. Grimaces and moans to brow pressure.  VIII:hearing intact to voice. IX, X: will not open mouth.  XI: Head is grossly midline.  Tone is rigid.  Bulk is normal.  Motor-Does not move extremities spontaneously. With passive ROM, she is rigid and moans.  Sensation- With noxious stimuli, she slightly moves her right foot.  No reaction of LLE. No reaction to RUE/LUE except moaning. Coordination: no tremors, clonus, twitching, fasciculations.  Gait- deferred  Labs Urine culture: + 70,000 colonies of lactobacillus.  Rest per HPI and below.   CBC    Component Value Date/Time   WBC 8.1 03/26/2021 0256   RBC 3.22 (L) 03/26/2021 0256    HGB 8.3 (L) 03/26/2021 0256   HCT 26.0 (L) 03/26/2021 0256   PLT 278 03/26/2021 0256   MCV 80.7 03/26/2021 0256   MCH 25.8 (L) 03/26/2021 0256   MCHC 31.9 03/26/2021 0256   RDW 23.2 (H) 03/26/2021 0256   LYMPHSABS 1.8 04/09/2021 1848   MONOABS 0.8 2021/04/09 1848   EOSABS 0.0 04-09-2021 1848   BASOSABS 0.1 04-09-2021 1848    CMP     Component Value Date/Time   NA 134 (L) 03/26/2021 0256   K 5.5 (H) 03/26/2021 0831   CL 103 03/26/2021 0256   CO2 21 (L) 03/26/2021 0256   GLUCOSE 201 (H) 03/26/2021 0256   BUN 90 (H) 03/26/2021 0256   CREATININE 3.28 (H) 03/26/2021 0256  CALCIUM 8.2 (L) 03/26/2021 0256   PROT 5.3 (L) 03/23/2021 0312   ALBUMIN 2.7 (L) 03/23/2021 0312   AST 22 03/23/2021 0312   ALT 13 03/23/2021 0312   ALKPHOS 56 03/23/2021 0312   BILITOT 0.4 03/23/2021 0312   GFRNONAA 14 (L) 03/26/2021 0256   GFRAA >60 09/19/2019 8413    Imaging MD reviewed the images obtained  CT head No acute intracranial abnormalities. Old infarcts, moderate to advanced chronic microvascular ischemic change and mild atrophy, stable compared to the prior CT.  Assessment: 75 yo female who presented with lethargy, hypoglycemia, sepsis, AKI, and multiple other toxic/metabolic derangements. Her presentation is most likely consistent with her acute illness, infection, uremia, and metabolic encephalopathy. CKD is charted but her creatinine Jan 22 was .57. However, her creatinine is very high and her BUN is climbing and we suspect her encephalopathy is mostly caused by uremia. Nephrology has stated patient is not a candidate for short term of long term CRRT. Due to known MS, this makes her more susceptible to uremic encephalopathy. Given her other metabolic derangements have improved and her infection is being treated, there is likely no other reversible cause for her presentation. These multiple acute illness factors in the setting of her cognitive issues, worse since COVID, may likely not allow her  to return to her baseline at SNF. One other differential to consider would be subclinical seizures, so we will obtain an rEEG.   Impression: -Encephalopathy likely secondary to acute multifactorial toxic and metabolic derangements superimposed on cognitive issues.  -MS, no suspicion of flare in this age lady.    Recommendations/Plan:  -continue to treat metabolic derangements as you are doing.  -continue to treat sepsis/infection/urosepsis.  -check TSH and Vitamin B12 level.  -supplement B12 if not over 500.  -check rEEG.  -Avoid all opioids, benzos, anticholinergics.  -Check Vit B1 level and replete if low.  -Delirium precautions.  -Recommend palliative care consult here for goals of care.   Pt seen by Jimmye Norman, NP/Neuro and later by MD. Note/plan to be edited by MD as needed.  Pager: [phone]  NEUROHOSPITALIST ADDENDUM Performed a face to face diagnostic evaluation.   I have reviewed the contents of history and physical exam as documented by PA/ARNP/Resident and agree with above documentation.  I have discussed and formulated the above plan as documented. Edits to the note have been made as needed.  Impression/Key exam findings/Plan:  62F with hx of MS ,prior R MCA stroke, admitted with lethargy, hypoglycemia, severe sepsis presumed to be due to UTI, oliguric AKI, Uremia and multiple other toxic/metabolic derangements. Exam with somnolence, poor attention. She moves RUE and RLE more so than LUE and LLE which I suspect is due to her prior R MCA stroke. She had [facility] EEG for 30 mins which was notable for cortical dysfunction in the R hemisphere likely secondary to the R MCA stroke with no epileptiform discharges or seizures.  It is going to be difficult to completely rule out potential meningitis but I think her leukocytosis is better explained by the UTI sepsis rather than meningitis and it has trended down with Abx.  I suspect that her climbing BUN and other metabolic  abnormalities are likely the cause of her encephalopathy. Patients with CNS disorders including MS and stroke patients are at increased risk of encephalopathy from metabolic derangements. Encephalopathy in these cases usually takes some time to improve after correction of metabolic derangements.  Neph is on board amd she is not a candidate  for short term KRT at this time per nephrology notes.   Erick Blinks, MD [facility] [phone]   If 7pm to 7am, please call on call as listed on AMION.

## 2021-03-26 NOTE — Progress Notes (Addendum)
eLink Physician-Brief Progress Note Patient Name: Janice Little DOB: 04/13/1946 MRN: [id number]   Date of Service  03/26/2021  HPI/Events of Note  Pt SBP 170's received Hydralazine 5 mg x 1, Q6hrs PRN. No other PRNs ordered.  Sepsis:UTI AKI-hyperkalemia( Nephro on case, notes reviewed).   eICU Interventions  Discussed with RN.hydralazine 5 mg IV ordered, call back if not better. Watch for any pain issues.       Intervention Category Intermediate Interventions: Hypertension - evaluation and management  Janice Little 03/26/2021, 7:40 PM

## 2021-03-26 NOTE — Progress Notes (Signed)
After giving patient bath, patient projectile vomited tube feeding. Tube feeding off for now, provider notified.

## 2021-03-26 NOTE — Progress Notes (Signed)
    BRIEF OVERNIGHT PROGRESS REPORT  Notified by RN for concern of aspiration of tube feed. Projectile vomiting witnessed after brief gurgling in the upper airway during patient care. RN immediately stopped tube feed (which is still on hold) suctioned airway, and reported a large amount of tube feed consistency, and color fluid on her chest down to approximately the area of her knees. Patient has some slight rhonchi on the right upper lobe on auscultation and appears in no obvious distress with no increase in work of breathing.or change in SPO2.  Chest x ray pending.  Chinita Greenland MSN MSNA ACNPC-AG Acute Care Nurse Practitioner [facility]

## 2021-03-27 DIAGNOSIS — Z7189 Other specified counseling: Secondary | ICD-10-CM

## 2021-03-27 DIAGNOSIS — R531 Weakness: Secondary | ICD-10-CM

## 2021-03-27 DIAGNOSIS — Z515 Encounter for palliative care: Secondary | ICD-10-CM

## 2021-03-27 LAB — GLUCOSE, CAPILLARY
Glucose-Capillary: 106 mg/dL — ABNORMAL HIGH (ref 70–99)
Glucose-Capillary: 113 mg/dL — ABNORMAL HIGH (ref 70–99)
Glucose-Capillary: 125 mg/dL — ABNORMAL HIGH (ref 70–99)
Glucose-Capillary: 130 mg/dL — ABNORMAL HIGH (ref 70–99)
Glucose-Capillary: 118 mg/dL — ABNORMAL HIGH (ref 70–99)
Glucose-Capillary: 134 mg/dL — ABNORMAL HIGH (ref 70–99)

## 2021-03-27 LAB — CULTURE, BLOOD (ROUTINE X 2)
Culture: NO GROWTH
Special Requests: ADEQUATE

## 2021-03-27 LAB — BASIC METABOLIC PANEL
Anion gap: 9 (ref 5–15)
BUN: 100 mg/dL — ABNORMAL HIGH (ref 8–23)
CO2: 22 mmol/L (ref 22–32)
Calcium: 8.5 mg/dL — ABNORMAL LOW (ref 8.9–10.3)
Chloride: 102 mmol/L (ref 98–111)
Creatinine, Ser: 3.26 mg/dL — ABNORMAL HIGH (ref 0.44–1.00)
GFR, Estimated: 14 mL/min — ABNORMAL LOW (ref >60)
Glucose, Bld: 106 mg/dL — ABNORMAL HIGH (ref 70–99)
Potassium: 4.9 mmol/L (ref 3.5–5.1)
Sodium: 133 mmol/L — ABNORMAL LOW (ref 135–145)

## 2021-03-27 LAB — CBC
HCT: 27.1 % — ABNORMAL LOW (ref 36.0–46.0)
Hemoglobin: 8.4 g/dL — ABNORMAL LOW (ref 12.0–15.0)
MCH: 25.4 pg — ABNORMAL LOW (ref 26.0–34.0)
MCHC: 31 g/dL (ref 30.0–36.0)
MCV: 81.9 fL (ref 80.0–100.0)
Platelets: 322 10*3/uL (ref 150–400)
RBC: 3.31 MIL/uL — ABNORMAL LOW (ref 3.87–5.11)
RDW: 24.3 % — ABNORMAL HIGH (ref 11.5–15.5)
WBC: 9.1 10*3/uL (ref 4.0–10.5)
nRBC: 0 % (ref 0.0–0.2)

## 2021-03-27 MED ORDER — HYDRALAZINE HCL 20 MG/ML IJ SOLN
10.0000 mg | Freq: Once | INTRAMUSCULAR | Status: AC
  Administered 2021-03-27: 10 mg via INTRAVENOUS
  Filled 2021-03-27: qty 1

## 2021-03-27 MED ORDER — HYDRALAZINE HCL 20 MG/ML IJ SOLN
10.0000 mg | Freq: Four times a day (QID) | INTRAMUSCULAR | Status: DC | PRN
  Administered 2021-03-28 – 2021-03-30 (×9): 10 mg via INTRAVENOUS
  Filled 2021-03-27 (×9): qty 1

## 2021-03-27 NOTE — Progress Notes (Signed)
Pt given hydralazine 5mg  at 10 am for high BP and was effective. However, BP is now 169/101 MAP 112. Dr. notified. Verbal order for Hydralazine now at 10mg  & to give Hydralazine 10 mg now for high BP.

## 2021-03-27 NOTE — Progress Notes (Signed)
Admit: 03/13/2021 LOS: 5  17M PMH including MS and chronic impaired cognition at SNF, Hypertension on ACE inhibitor presenting with AKI, hyperkalemia, progressive hyponatremia, normal anion gap metabolic acidosis  Subjective SCr not much improved thus far but not worse either-  BUN climbing-  k is better Sodium improving slowly  1300 UOP yesterday with lasix Pt still essentially obtunded - no change in clinical status-  palliative has made contact   At baseline is somewhat verbal per report from RNs/SNF  06/24 0701 - 06/25 0700 In: 681.8 [I.V.:156.5; NG/GT:525.3] Out: 1350 [Urine:1350]  Filed Weights   03/30/2021 2301 03/24/21 0500 03/25/21 0500  Weight: 71.6 kg 77.6 kg 76.9 kg    Scheduled Meds:  chlorhexidine  15 mL Mouth Rinse BID   Chlorhexidine Gluconate Cloth  6 each Topical Daily   feeding supplement (PROSource TF)  45 mL Per Tube BID   free water  30 mL Per Tube Q6H   furosemide  40 mg Intravenous Q12H   heparin  5,000 Units Subcutaneous Q8H   mouth rinse  15 mL Mouth Rinse BID   mupirocin ointment  1 application Nasal BID   sodium bicarbonate  650 mg Oral TID   Continuous Infusions:  sodium chloride 10 mL/hr at 03/27/21 1125   cefTRIAXone (ROCEPHIN)  IV Stopped (03/26/21 2205)   feeding supplement (OSMOLITE 1.5 CAL) 1,000 mL (03/27/21 0743)   PRN Meds:.acetaminophen **OR** acetaminophen, hydrALAZINE, ondansetron **OR** ondansetron (ZOFRAN) IV  Current Labs: reviewed    Physical Exam:  Blood pressure (!) 150/84, pulse (!) 101, temperature 99 F (37.2 C), temperature source Axillary, resp. rate 11, weight 76.9 kg, SpO2 99 %. GEN: Chronically ill-appearing, NAD, not interactive, eyes open randomly ENT: NCAT, mild temporal wasting EYES: EOMI CV: Regular, normal S1 and S2 PULM: Clear bilaterally, normal work of breathing ABD: Soft, nontender, no masses or HSM SKIN: No rashes or lesions EXT: pitting edema  A Oliguric AKI: likely hypovolemia/ACEi +/- transition  to ATN.   Hyperkalemia, severe, improved with med mgmt.   Likely related to 1, use of ACEi- now held.   K starting to creep up again-  given lokelma 6/24 and also added lasix -  also on bicarb  Hypertension, on 3 agents, including ACE inhibitor, held at presentation- now better on no meds-  is edematous-  added on diuretics  hyponatremia likely from excessive free water intake to try to correct hyperglycemia, slowly improving NAG metabolic acidosis likely related to #1- on oral bicarb Sepsis, likely UTI source, rocephin per TRH/CCM, cultures pending-  WBC improved but she clinically has not MS, impaired cognition chronically, at SNF-  not getting any better-  BUN is likely not helping  History of surgical correction of primary hyperparathyroidism, calcium normal to low DM2, on metformin as outpatient Hypoglycemia, likely combination of relative insulin access and low GFR  P     she is not a short or long term RRT candidate given #7-   I would support palliative care involvement of GOC discussion-  has started.  Renal really does not have much to offer-  will sign off but call with any concerns   Cecille Aver  03/27/2021, 11:49 AM  Recent Labs  Lab 03/24/21 0320 03/24/21 0853 03/25/21 0258 03/26/21 0256 03/26/21 0831 03/27/21 0329  NA  --    < > 130* 134*  --  133*  K  --    < > 4.5 5.4* 5.5* 4.9  CL  --    < >  100 103  --  102  CO2  --    < > 20* 21*  --  22  GLUCOSE  --    < > 165* 201*  --  106*  BUN  --    < > 78* 90*  --  100*  CREATININE  --    < > 3.37* 3.28*  --  3.26*  CALCIUM  --    < > 8.0* 8.2*  --  8.5*  PHOS 3.3  --  3.7 3.3  --   --    < > = values in this interval not displayed.   Recent Labs  Lab 04-02-2021 1848 03/23/21 0312 03/25/21 0258 03/26/21 0256 03/27/21 0329  WBC 23.3*   < > 9.8 8.1 9.1  NEUTROABS 20.5*  --   --   --   --   HGB 7.8*   < > 9.4* 8.3* 8.4*  HCT 26.3*   < > 29.0* 26.0* 27.1*  MCV 79.0*   < > 77.7* 80.7 81.9  PLT 473*   <  > 328 278 322   < > = values in this interval not displayed.

## 2021-03-27 NOTE — Consult Note (Signed)
Consultation Note Date: 03/27/2021   Patient Name: Janice Little  DOB: 07/08/1946  MRN: [id number]  Age / Sex: 75 y.o., female  PCP: Leighton Ruff, MD Referring Physician: Shawna Clamp, MD  Reason for Consultation: Establishing goals of care  HPI/Patient Profile: 75 y.o. female  admitted on 04/01/2021    Clinical Assessment and Goals of Care: 75 year old lady from [facility], known to palliative service seen in consultation in previous hospitalizations, admitted from facility with hypoglycemia and encephalopathy.  Reportedly noted to have been lethargic and having diarrhea and low blood sugars at nursing facility.  Admitted to hospital medicine service with neurology, nephrology and critical care specialist following.  Remains near obtunded in the ICU at [facility] in [place], [place], palliative consultation has been requested again for ongoing goals of care discussions.  Patient seen and examined.  Call placed and discussed with sister.  See below.  Chart reviewed.  HCPOA  Sister Ivin Booty is HCPOA agent.   SUMMARY OF RECOMMENDATIONS   Call placed and discussed with sister healthcare power of attorney agent Ivin Booty over the phone, advance care planning documents loaded in Vynca also noted:  Continue current mode of care, monitor patient's overall condition and hospitalization course and overall trajectory of illness to help determine next steps with regards to broad goals of care discussions.  Discussed briefly with sister about patient's ongoing serious illnesses: Acute kidney injury, ongoing metabolic encephalopathy in the setting of her underlying multiple chronic illnesses and that this places her at a high risk for ongoing decline in decompensation rather than meaningful recovery.  Sister is aware.  Also discussed with her about renal colleagues following and patient  likely not being a hemodialysis candidate, sister is in understanding. For now, continue current mode of care and continue goals of care discussions with the patient's sister.  Would recommend time trial of the next 2-3 days of current treatments, if ongoing decline/no reasonable stabilization and recovery as expected, then, would recommend full scope of comfort measures and hospice level of care, I have briefly initiated these discussions with the patient's sister.  Palliative to follow.  Thank you for the consult.  Code Status/Advance Care Planning: Limited code   Symptom Management:    Palliative Prophylaxis:  Delirium Protocol  Additional Recommendations (Limitations, Scope, Preferences): No Hemodialysis  Psycho-social/Spiritual:  Desire for further Chaplaincy support:yes Additional Recommendations: Caregiving  Support/Resources  Prognosis:  Unable to determine  Discharge Planning: To Be Determined      Primary Diagnoses: Present on Admission:  Severe sepsis ([place])  Acute lower UTI  Acute metabolic encephalopathy  Essential hypertension  GERD  Multiple sclerosis (HCC)  Hyperlipidemia  Type 2 diabetes mellitus with hypoglycemia without coma (HCC)  AKI (acute kidney injury) ([place])  Hyperkalemia  Hyponatremia  Hypoglycemia   I have reviewed the medical record, interviewed the patient and family, and examined the patient. The following aspects are pertinent.  Past Medical History:  Diagnosis Date   Chronic kidney disease (CKD), stage II (mild)    Chronic  pain    Chronic respiratory failure (HCC)    DM type 2 (diabetes mellitus, type 2) (HCC)    Dysphagia, oropharyngeal phase    Fatty liver    per CT   GERD (gastroesophageal reflux disease)    History of hepatitis as a child    age 31 (jaundice)   HTN (hypertension)    Hyperlipidemia    Monoplegia of upper limb (HCC)    MS (multiple sclerosis) ([place])    Obesity    Osteopenia    Primary hyperparathyroidism  (HCC)    s/p surgery removal of parathyroid (Dr. Buddy Duty, Dr. Harlow Asa)   Vitamin D deficiency    Social History   Socioeconomic History   Marital status: Legally Separated    Spouse name: Not on file   Number of children: Not on file   Years of education: Not on file   Highest education level: Not on file  Occupational History   Not on file  Tobacco Use   Smoking status: Never   Smokeless tobacco: Never  Substance and Sexual Activity   Alcohol use: No   Drug use: No   Sexual activity: Not on file  Other Topics Concern   Not on file  Social History Narrative   Not on file   Social Determinants of Health   Financial Resource Strain: Not on file  Food Insecurity: Not on file  Transportation Needs: Not on file  Physical Activity: Not on file  Stress: Not on file  Social Connections: Not on file   Family History  Problem Relation Age of Onset   Mental illness Mother    COPD Father    Lung cancer Father    Scheduled Meds:  chlorhexidine  15 mL Mouth Rinse BID   Chlorhexidine Gluconate Cloth  6 each Topical Daily   feeding supplement (PROSource TF)  45 mL Per Tube BID   free water  30 mL Per Tube Q6H   furosemide  40 mg Intravenous Q12H   heparin  5,000 Units Subcutaneous Q8H   mouth rinse  15 mL Mouth Rinse BID   mupirocin ointment  1 application Nasal BID   sodium bicarbonate  650 mg Oral TID   Continuous Infusions:  sodium chloride 10 mL/hr at 03/27/21 0833   cefTRIAXone (ROCEPHIN)  IV Stopped (03/26/21 2205)   feeding supplement (OSMOLITE 1.5 CAL) 1,000 mL (03/27/21 0743)   PRN Meds:.acetaminophen **OR** acetaminophen, hydrALAZINE, ondansetron **OR** ondansetron (ZOFRAN) IV Medications Prior to Admission:  Prior to Admission medications   Medication Sig Start Date End Date Taking? Authorizing Provider  acetaminophen (TYLENOL) 325 MG tablet Take 2 tablets (650 mg total) by mouth every 4 (four) hours as needed for mild pain (or temp > 37.5 C (99.5 F)). 03/05/19  Yes  Barton Dubois, MD  amLODipine (NORVASC) 5 MG tablet Take 5 mg by mouth daily. 07/24/19  Yes [provider]  aspirin 325 MG tablet Take 1 tablet (325 mg total) by mouth daily. 03/06/19  Yes Barton Dubois, MD  Cholecalciferol (VITAMIN D) 50 MCG (2000 UT) CAPS Take 2,000 Units by mouth daily.   Yes [provider]  DULoxetine HCl 40 MG CPEP Take 40 mg by mouth daily.   Yes [provider]  gabapentin (NEURONTIN) 100 MG capsule Take 100 mg by mouth 3 (three) times daily.   Yes [provider]  Glucagon, rDNA, (GLUCAGON EMERGENCY) 1 MG KIT Inject 1 mg into the muscle every 8 (eight) hours as needed for low blood sugar.  03/23/21  Yes [provider]  hydrALAZINE (APRESOLINE) 25 MG tablet Take 3 tablets (75 mg total) by mouth every 8 (eight) hours. Patient taking differently: Take 75 mg by mouth 3 (three) times daily. 03/05/19  Yes Barton Dubois, MD  Infant Care Products [facility]) OINT Apply 1 application topically See admin instructions. Apply to buttocks at every shift change   Yes [provider]  insulin glargine (LANTUS) 100 UNIT/ML injection Inject 30 Units into the skin at bedtime.   Yes [provider]  Lactobacillus (ACIDOPHILUS) TABS Take 1 capsule by mouth 3 (three) times daily. 03/12/21  Yes [provider]  lisinopril (ZESTRIL) 40 MG tablet Take 40 mg by mouth daily.   Yes [provider]  Melatonin 3 MG TABS Take 3 mg by mouth at bedtime.    Yes [provider]  metFORMIN (GLUCOPHAGE-XR) 500 MG 24 hr tablet Take 1 tablet (500 mg total) by mouth 2 (two) times a day. 03/05/19  Yes Barton Dubois, MD  Multiple Vitamins-Minerals (CEROVITE SENIOR) TABS Take 1 tablet by mouth daily.   Yes [provider]  NOVOLOG FLEXPEN 100 UNIT/ML FlexPen Inject 0-11 Units into the skin 3 (three) times daily between meals. 101-150 2 units 151-200 3 units 201-250 5 units 251-300 7 units 301-350 9 units 351+ 11 units  CALL MD FOR BS>400 OR <60 02/23/21  Yes [provider]  omeprazole (PRILOSEC OTC) 20 MG tablet Take 20 mg by mouth daily.   Yes [provider]  polyethylene glycol powder (GLYCOLAX/MIRALAX) 17 GM/SCOOP powder Take 17 g by mouth daily as needed for mild constipation.   Yes [provider]  amoxicillin-clavulanate (AUGMENTIN) 875-125 MG tablet Take 1 tablet by mouth every 12 (twelve) hours. Patient not taking: Reported on 03/23/2021 10/23/20   Patrecia Pour, MD  ciprofloxacin (CIPRO) 500 MG tablet Take 500 mg by mouth 2 (two) times daily. 03/12/21   [provider]  sulfamethoxazole-trimethoprim (BACTRIM DS) 800-160 MG tablet Take 1 tablet by mouth 2 (two) times daily. 03/12/21   [provider]   No Known Allergies Review of Systems Non verbal  Physical Exam Patient appears weak and lethargic Resting in bed Appears with generalized weakness Chronically ill-appearing Attempts to move to voice, does not follow commands Monitor noted Regular breath sounds S1-S2  Vital Signs: BP (!) 173/58 (BP Location: Right Arm)   Pulse 93   Temp 98 F (36.7 C) (Axillary)   Resp 16   Wt 76.9 kg   SpO2 100%   BMI 28.21 kg/m  Pain Scale: CPOT   Pain Score: 0-No pain   SpO2: SpO2: 100 % O2 Device:SpO2: 100 % O2 Flow Rate: .O2 Flow Rate (L/min): 2 L/min  IO: Intake/output summary:  Intake/Output Summary (Last 24 hours) at 03/27/2021 1010 Last data filed at 03/27/2021 7939 Gross per 24 hour  Intake 847.78 ml  Output 1350 ml  Net -502.22 ml    LBM: Last BM Date: 03/26/21 Baseline Weight: Weight: 71.6 kg Most recent weight: Weight: 76.9 kg     Palliative Assessment/Data:   PPS 20%  Time In:  9.15 Time Out:  10.15 Time Total:  60  Greater than 50%  of this time was spent counseling and coordinating care related to the above assessment and plan.  Signed by: Loistine Chance, MD   Please contact Palliative Medicine Team phone at [phone] for  questions and concerns.  For individual provider: See Shea Evans

## 2021-03-27 NOTE — Progress Notes (Signed)
PROGRESS NOTE    Janice Little  LGX:[id number] DOB: 22-Oct-1945 DOA: 03/12/2021 PCP: Juluis Rainier, MD   Brief Narrative:  This 75 y.o. female with PMH significant of diabetes, chronic kidney disease stage III, chronic pain syndrome, chronic respiratory failure secondary to COPD, multiple sclerosis, GERD, essential hypertension and primary hyperparathyroidism s/p surgery who presents to the ED with hypoglycemia by EMS.  Patient apparently started having diarrhea  at [facility].  Her blood glucose was noted to be 34 then 70 and 25.  She was given glucagon x 4 . She has not received any insulin at SNF.  When patient arrived the ER she appeared drowsy, unable to give any history.  Her initial blood sugar was 46.  She was given D10 and blood sugar went to 130.  She is so lethargic this not much history was obtained.  Patient was noted to be in acute kidney failure with creatinine more than 4.0  It was previously normal. She was found to have hyperkalemia as well as lactic acidosis which has resolved.  Suspected pyuria and severe sepsis.  Her blood pressure is however stable.  She is hypoxic but has chronically been on 2 L of oxygen.   She is being admitted as a case of severe sepsis with multiple electrolyte abnormalities.  Critical care , Nephrology and Neurology is consulted.   Assessment & Plan:   Principal Problem:   Severe sepsis (HCC) Active Problems:   Type 2 diabetes mellitus with hypoglycemia without coma (HCC)   Hyperlipidemia   Multiple sclerosis (HCC)   Essential hypertension   GERD   Acute lower UTI   Acute metabolic encephalopathy   AKI (acute kidney injury) (HCC)   Hyperkalemia   Hyponatremia   Hypoglycemia   Pressure ulcer of left leg  Severe sepsis presumed to be secondary to the UTI: Patient presented with tachycardia, tachypnea, hypotension,  urinary retention, elevated lactic acid. UA: LE - Nitrite -, Many bacteria Started on empiric antibiotics vancomycin and  cefepime(MRSA nares+) Blood culture: No growth so far, urine cultures grew lactobacillus. Discontinue IV hydration.  Lactic acid> improved. Continue ceftriaxone for UTI.  Hyperkalemia  : Could be due to acute renal failure.  Due to dehydration in the setting of diarrhea Treated with calcium gluconate, Dextrose, Insulin and Lokelma,  potassium has improved. continue to monitor.  Acute Kidney injury : Multifactorial: Patient presented with serum creatinine up to 4.3,  Baseline creatinine normal. This could be multifactorial in the setting of sepsis and dehydration. Nephrology is following , creatinine is stable at 3.2 Avoid nephrotoxic medications, creatinine trending down 3.28. Patient is not short term or Long-term CRRT candidate.  Suspected stercoral colitis: Seen on CT scan.  Continue glycerin suppository   Hypoglycemia : Resolved. Could be due to insulin accumulation in the setting of acute renal failure which is improving. Hold home insulin.  Blood glucose is improving.   Hyponatremia:  Due to dehydration,  continue to monitor. Continue gentle IV hydration.   History of multiple sclerosis : Stable.  History of CVA:  Continue home regimen when she is stable.  Acute metabolic encephalopathy could be secondary to acute illness: She continues to remain lethargic, ammonia level normal, ABG unremarkable. Neurology consulted, EEG : No evidence of seizures. Neurology thinks encephalopathy is due to electrolyte imbalance and uremia. Palliative care consulted to discuss goals of care.   Family wants to continue current scope of care.  Insulin-dependent diabetes:  Hold all insulin and her metformin due to hypoglycemia.  Monitor capillary blood glucose closely.   Essential hypertension: Hydralazine 5mg  IV q6hrs prn  GERD : resume PPI  Chronic pain syndrome: Hold Cymbalta and gabapentin due to AMS  Left leg decubitus ulcer - local wound care  Microcytic hypochromic  anemia: Hemoglobin dropped from 7.2- 6.9.  s/p 1 PRBC hemoglobin 9.4> 8.3 > 8.4  There is no visible signs of any obvious bleeding.  Continue to monitor H&H   DVT prophylaxis: Heparin Code Status: Full code. Family Communication: No family at bed side. Disposition Plan:   Status is: Inpatient  Remains inpatient appropriate because:Inpatient level of care appropriate due to severity of illness  Dispo: The patient is from: SNF              Anticipated d/c is to: SNF              Patient currently is not medically stable to d/c.   Difficult to place patient No  Consultants:  Nephrology Neurology PCCM  Procedures:  Antimicrobials:   Anti-infectives (From admission, onward)    Start     Dose/Rate Route Frequency Ordered Stop   03/24/21 2200  cefTRIAXone (ROCEPHIN) 2 g in sodium chloride 0.9 % 100 mL IVPB        2 g 200 mL/hr over 30 Minutes Intravenous Every 24 hours 03/24/21 0936     03/23/21 2200  cefTRIAXone (ROCEPHIN) 1 g in sodium chloride 0.9 % 100 mL IVPB  Status:  Discontinued        1 g 200 mL/hr over 30 Minutes Intravenous Every 24 hours 03/23/21 0022 03/23/21 0916   03/23/21 2200  ceFEPIme (MAXIPIME) 2 g in sodium chloride 0.9 % 100 mL IVPB  Status:  Discontinued        2 g 200 mL/hr over 30 Minutes Intravenous Every 24 hours 03/23/21 0945 03/24/21 0936   03/23/21 0145  ceFEPIme (MAXIPIME) 2 g in sodium chloride 0.9 % 100 mL IVPB  Status:  Discontinued        2 g 200 mL/hr over 30 Minutes Intravenous  Once 03/23/21 0045 03/23/21 0046   04/01/21 2130  ceFEPIme (MAXIPIME) 2 g in sodium chloride 0.9 % 100 mL IVPB        2 g 200 mL/hr over 30 Minutes Intravenous  Once 04/01/21 2121 03/23/21 0046   04/01/2021 2130  metroNIDAZOLE (FLAGYL) IVPB 500 mg        500 mg 100 mL/hr over 60 Minutes Intravenous  Once 2021/04/01 2121 03/23/21 0130   2021/04/01 2130  vancomycin (VANCOCIN) IVPB 1000 mg/200 mL premix        1,000 mg 200 mL/hr over 60 Minutes Intravenous  Once 2021/04/01  2121 03/23/21 0210        Subjective: Patient was seen and examined at bedside.  Overnight events noted. She is chronically ill looking, her eyes are open but non verbal,  RN reports she opens eyes sometimes, she is pretty swollen has significant bruises noted on the right forearm.  Objective: Vitals:   03/27/21 0920 03/27/21 1000 03/27/21 1100 03/27/21 1134  BP:  (!) 166/57 (!) 150/84   Pulse:  97 (!) 101   Resp:  20 11   Temp: 98 F (36.7 C) 98.24 F (36.8 C) 98.6 F (37 C) 99 F (37.2 C)  TempSrc: Axillary   Axillary  SpO2:  99% 99%   Weight:        Intake/Output Summary (Last 24 hours) at 03/27/2021 1143 Last data filed at 03/27/2021 1125 Gross  per 24 hour  Intake 1932.28 ml  Output 1350 ml  Net 582.28 ml   Filed Weights   2021/02/22 2301 03/24/21 0500 03/25/21 0500  Weight: 71.6 kg 77.6 kg 76.9 kg    Examination:  General exam: Appears calm and comfortable , does not appear in any acute distress. Respiratory system: Mild baseline crackles on auscultation. Respiratory effort normal. Cardiovascular system: S1 & S2 heard, RRR. No JVD, murmurs, rubs, gallops or clicks. No pedal edema. Gastrointestinal system: Abdomen is nondistended, soft and nontender. No organomegaly or masses felt. Normal bowel sounds heard. Central nervous system: opens eyes but non verbal . No focal neurological deficits. Extremities: No edema, no cyanosis, no clubbing. Skin: No rashes, lesions or ulcers Psychiatry: not assessed.    Data Reviewed: I have personally reviewed following labs and imaging studies  CBC: Recent Labs  Lab 2021/02/22 1848 03/23/21 0312 03/24/21 1614 03/25/21 0258 03/26/21 0256 03/27/21 0329  WBC 23.3* 17.4*  --  9.8 8.1 9.1  NEUTROABS 20.5*  --   --   --   --   --   HGB 7.8* 7.3* 6.9* 9.4* 8.3* 8.4*  HCT 26.3* 24.1* 22.2* 29.0* 26.0* 27.1*  MCV 79.0* 78.0*  --  77.7* 80.7 81.9  PLT 473* 404*  --  328 278 322   Basic Metabolic Panel: Recent Labs  Lab  03/23/21 1156 03/23/21 1526 03/23/21 2020 03/24/21 0320 03/24/21 0853 03/24/21 1412 03/25/21 0258 03/26/21 0256 03/26/21 0831 03/27/21 0329  NA 128*  --    < >  --  128* 129* 130* 134*  --  133*  K 6.2*  --    < >  --  4.8 4.7 4.5 5.4* 5.5* 4.9  CL 100  --    < >  --  100 101 100 103  --  102  CO2 16*  --    < >  --  16* 18* 20* 21*  --  22  GLUCOSE 47*  --    < >  --  156* 239* 165* 201*  --  106*  BUN 74*  --    < >  --  76* 78* 78* 90*  --  100*  CREATININE 3.78*  --    < >  --  3.57* 3.48* 3.37* 3.28*  --  3.26*  CALCIUM 8.3*  --    < >  --  7.7* 7.8* 8.0* 8.2*  --  8.5*  MG 1.2* 1.2*  --  1.2*  --   --  2.2 2.1  --   --   PHOS 3.7 3.7  --  3.3  --   --  3.7 3.3  --   --    < > = values in this interval not displayed.   GFR: Estimated Creatinine Clearance: 15.1 mL/min (A) (by C-G formula based on SCr of 3.26 mg/dL (H)). Liver Function Tests: Recent Labs  Lab 2021/02/22 1848 03/23/21 0312  AST 21 22  ALT 16 13  ALKPHOS 56 56  BILITOT 0.3 0.4  PROT 6.1* 5.3*  ALBUMIN 2.9* 2.7*   No results for input(s): LIPASE, AMYLASE in the last 168 hours. Recent Labs  Lab 03/23/21 0421 03/26/21 0831  AMMONIA 48* 29   Coagulation Profile: Recent Labs  Lab 03/23/21 0312  INR 1.2   Cardiac Enzymes: No results for input(s): CKTOTAL, CKMB, CKMBINDEX, TROPONINI in the last 168 hours. BNP (last 3 results) No results for input(s): PROBNP in the last 8760 hours. HbA1C: No results  for input(s): HGBA1C in the last 72 hours. CBG: Recent Labs  Lab 03/26/21 1524 03/26/21 1953 03/26/21 2315 03/27/21 0328 03/27/21 0912  GLUCAP 120* 105* 106* 106* 113*   Lipid Profile: No results for input(s): CHOL, HDL, LDLCALC, TRIG, CHOLHDL, LDLDIRECT in the last 72 hours. Thyroid Function Tests: Recent Labs    03/26/21 1404  TSH 1.655   Anemia Panel: Recent Labs    03/26/21 1404  VITAMINB12 187   Sepsis Labs: Recent Labs  Lab 03/23/21 0312 03/23/21 0417 03/23/21 0822  03/23/21 1526 03/24/21 0320  PROCALCITON 6.48  --   --   --   --   LATICACIDVEN  --  6.9* 4.5* 4.6* 2.1*    Recent Results (from the past 240 hour(s))  Culture, blood (Routine X 2) w Reflex to ID Panel     Status: None   Collection Time: 06-Apr-2021  7:42 PM   Specimen: BLOOD  Result Value Ref Range Status   Specimen Description   Final    BLOOD LEFT ANTECUBITAL Performed at [facility], [address] [place], [place] [address]    Special Requests   Final    BOTTLES DRAWN AEROBIC AND ANAEROBIC Blood Culture adequate volume Performed at [facility], [address] [place], [place] [address]    Culture   Final    NO GROWTH 5 DAYS Performed at [facility], [address], [place], [place] [address]    Report Status 03/27/2021 FINAL  Final  Urine Culture     Status: Abnormal   Collection Time: 2021-04-06  8:30 PM   Specimen: Urine, Random  Result Value Ref Range Status   Specimen Description   Final    URINE, RANDOM Performed at [facility], [address], [place], [place] [address]    Special Requests   Final    NONE Performed at [facility], [address], [place], [place] [address]    Culture (A)  Final    70,000 COLONIES/mL LACTOBACILLUS SPECIES Standardized susceptibility testing for this organism is not available. Performed at [facility], [address] [place], [place] [address]    Report Status 03/24/2021 FINAL  Final  Resp Panel by RT-PCR (Flu A&B, Covid) Nasopharyngeal Swab     Status: None   Collection Time: 04/06/2021  8:54 PM   Specimen: Nasopharyngeal Swab; Nasopharyngeal(NP) swabs in vial transport medium  Result Value Ref Range Status   SARS Coronavirus 2 by RT PCR NEGATIVE NEGATIVE Final    Comment: (NOTE) SARS-CoV-2 target nucleic acids are NOT DETECTED.  The SARS-CoV-2 RNA is generally detectable in upper respiratory specimens during the acute phase of  infection. The lowest concentration of SARS-CoV-2 viral copies this assay can detect is 138 copies/mL. A negative result does not preclude SARS-Cov-2 infection and should not be used as the sole basis for treatment or other patient management decisions. A negative result may occur with  improper specimen collection/handling, submission of specimen other than nasopharyngeal swab, presence of viral mutation(s) within the areas targeted by this assay, and inadequate number of viral copies(<138 copies/mL). A negative result must be combined with clinical observations, patient history, and epidemiological information. The expected result is Negative.  Fact Sheet for Patients:  BloggerCourse.com  Fact Sheet for Healthcare Providers:  SeriousBroker.it  This test is no t yet approved or cleared by the [place] FDA and  has been authorized for detection and/or diagnosis of SARS-CoV-2 by FDA under  an Emergency Use Authorization (EUA). This EUA will remain  in effect (meaning this test can be used) for the duration of the COVID-19 declaration under Section 564(b)(1) of the Act, 21 U.S.C.section 360bbb-3(b)(1), unless the authorization is terminated  or revoked sooner.       Influenza A by PCR NEGATIVE NEGATIVE Final   Influenza B by PCR NEGATIVE NEGATIVE Final    Comment: (NOTE) The Xpert Xpress SARS-CoV-2/FLU/RSV plus assay is intended as an aid in the diagnosis of influenza from Nasopharyngeal swab specimens and should not be used as a sole basis for treatment. Nasal washings and aspirates are unacceptable for Xpert Xpress SARS-CoV-2/FLU/RSV testing.  Fact Sheet for Patients: BloggerCourse.com  Fact Sheet for Healthcare Providers: SeriousBroker.it  This test is not yet approved or cleared by the [place] FDA and has been authorized for detection and/or diagnosis of SARS-CoV-2  by FDA under an Emergency Use Authorization (EUA). This EUA will remain in effect (meaning this test can be used) for the duration of the COVID-19 declaration under Section 564(b)(1) of the Act, 21 U.S.C. section 360bbb-3(b)(1), unless the authorization is terminated or revoked.  Performed at [facility], [address], [place], [place] [address]   Culture, blood (Routine X 2) w Reflex to ID Panel     Status: None (Preliminary result)   Collection Time: 03/11/2021 10:15 PM   Specimen: BLOOD  Result Value Ref Range Status   Specimen Description BLOOD BLOOD RIGHT FOREARM  Final   Special Requests   Final    BOTTLES DRAWN AEROBIC AND ANAEROBIC Blood Culture results may not be optimal due to an excessive volume of blood received in culture bottles   Culture   Final    NO GROWTH 4 DAYS Performed at [facility], [address], [place], [place] [address]    Report Status PENDING  Incomplete  MRSA PCR Screening     Status: Abnormal   Collection Time: 03/23/21 12:15 AM  Result Value Ref Range Status   MRSA by PCR POSITIVE (A) NEGATIVE Final    Comment:        The GeneXpert MRSA Assay (FDA approved for NASAL specimens only), is one component of a comprehensive MRSA colonization surveillance program. It is not intended to diagnose MRSA infection nor to guide or monitor treatment for MRSA infections. RESULT CALLED TO, READ BACK BY AND VERIFIED WITH: STACY, RN @ 0241 ON 03/23/21 Riesa Pope Performed at [facility], [address], [place], [place] [address]    Radiology Studies: DG CHEST PORT 1 VIEW  Result Date: 03/26/2021 CLINICAL DATA:  Aspiration EXAM: PORTABLE CHEST 1 VIEW COMPARISON:  03/25/2021 FINDINGS: Lungs are clear. No pneumothorax or pleural effusion. Nasogastric tube extends into the upper abdomen beyond the margin of the examination. Cardiac size within normal limits. Implanted loop recorder again noted. Pulmonary vascularity  is normal. No acute bone abnormality. IMPRESSION: Nasogastric tube in appropriate position. No focal pulmonary infiltrate. Electronically Signed   By: Helyn Numbers MD   On: 03/26/2021 05:34   DG CHEST PORT 1 VIEW  Result Date: 03/25/2021 CLINICAL DATA:  Possible pneumonia EXAM: PORTABLE CHEST 1 VIEW COMPARISON:  03/23/2021 FINDINGS: Cardiac shadow is within normal limits. Gastric catheter is noted within the stomach. Loop recorder is again seen. Lungs are well aerated bilaterally. Persistent vascular congestion is noted. No focal confluent infiltrate is seen. No bony abnormality is noted. IMPRESSION: Persistent vascular congestion.  No new focal abnormality is seen. Gastric catheter  within the stomach. Electronically Signed   By: Alcide Clever M.D.   On: 03/25/2021 19:01   EEG adult  Result Date: 03/26/2021 Charlsie Quest, MD     03/26/2021  5:22 PM Patient Name: Janice Little MRN: [id number] Epilepsy Attending: Charlsie Quest Referring Physician/Provider: Dr Erick Blinks Date: 03/26/2021 Duration: 50.14 mins Patient history: 75 year old female with history of MS and worsening mental status.  EEG to evaluate for seizures. Level of alertness:  comatose AEDs during EEG study: None Technical aspects: This EEG was obtained using a 10 lead EEG system positioned circumferentially without any parasagittal coverage (rapid EEG). Computer selected EEG is reviewed as well as background features and all clinically significant events. Description: No posterior dominant rhythm was seen.  EEG showed continuous generalized and lateralized right hemisphere 3 to 5 Hz theta-delta slowing.  Hyperventilation and photic stimulation were not performed.   ABNORMALITY - Continuous slow, generalized and lateralized right hemisphere IMPRESSION: This study is suggestive of cortical dysfunction in right hemisphere likely secondary to underlying structural abnormality/stroke.  Additionally there is evidence of moderate  degree of encephalopathy, nonspecific etiology.  No seizures or epileptiform discharges were seen throughout the recording.  If suspicion for interictal activity remains a concern, a prolonged study with traditional eeg can be considered. Priyanka Annabelle Harman    Scheduled Meds:  chlorhexidine  15 mL Mouth Rinse BID   Chlorhexidine Gluconate Cloth  6 each Topical Daily   feeding supplement (PROSource TF)  45 mL Per Tube BID   free water  30 mL Per Tube Q6H   furosemide  40 mg Intravenous Q12H   heparin  5,000 Units Subcutaneous Q8H   mouth rinse  15 mL Mouth Rinse BID   mupirocin ointment  1 application Nasal BID   sodium bicarbonate  650 mg Oral TID   Continuous Infusions:  sodium chloride 10 mL/hr at 03/27/21 1125   cefTRIAXone (ROCEPHIN)  IV Stopped (03/26/21 2205)   feeding supplement (OSMOLITE 1.5 CAL) 1,000 mL (03/27/21 0743)     LOS: 5 days    Time spent: 35 mins    Nickey Kloepfer, MD [facility]   If 7PM-7AM, please contact night-coverage

## 2021-03-28 DIAGNOSIS — Z515 Encounter for palliative care: Secondary | ICD-10-CM

## 2021-03-28 DIAGNOSIS — Z7189 Other specified counseling: Secondary | ICD-10-CM

## 2021-03-28 LAB — BASIC METABOLIC PANEL
Anion gap: 8 (ref 5–15)
BUN: 111 mg/dL — ABNORMAL HIGH (ref 8–23)
CO2: 23 mmol/L (ref 22–32)
Calcium: 8.6 mg/dL — ABNORMAL LOW (ref 8.9–10.3)
Chloride: 105 mmol/L (ref 98–111)
Creatinine, Ser: 3.38 mg/dL — ABNORMAL HIGH (ref 0.44–1.00)
GFR, Estimated: 14 mL/min — ABNORMAL LOW (ref >60)
Glucose, Bld: 154 mg/dL — ABNORMAL HIGH (ref 70–99)
Potassium: 4.3 mmol/L (ref 3.5–5.1)
Sodium: 136 mmol/L (ref 135–145)

## 2021-03-28 LAB — CULTURE, BLOOD (ROUTINE X 2): Culture: NO GROWTH

## 2021-03-28 LAB — GLUCOSE, CAPILLARY
Glucose-Capillary: 156 mg/dL — ABNORMAL HIGH (ref 70–99)
Glucose-Capillary: 173 mg/dL — ABNORMAL HIGH (ref 70–99)
Glucose-Capillary: 178 mg/dL — ABNORMAL HIGH (ref 70–99)
Glucose-Capillary: 200 mg/dL — ABNORMAL HIGH (ref 70–99)
Glucose-Capillary: 200 mg/dL — ABNORMAL HIGH (ref 70–99)
Glucose-Capillary: 214 mg/dL — ABNORMAL HIGH (ref 70–99)

## 2021-03-28 LAB — HEMOGLOBIN AND HEMATOCRIT, BLOOD
HCT: 26.2 % — ABNORMAL LOW (ref 36.0–46.0)
Hemoglobin: 8.5 g/dL — ABNORMAL LOW (ref 12.0–15.0)

## 2021-03-28 MED ORDER — NYSTATIN 100000 UNIT/GM EX POWD
Freq: Two times a day (BID) | CUTANEOUS | Status: DC
  Administered 2021-03-30: 1 via TOPICAL
  Filled 2021-03-28: qty 15

## 2021-03-28 NOTE — Progress Notes (Signed)
Daily Progress Note   Patient Name: Janice Little       Date: 03/28/2021 DOB: 1946-08-26  Age: 75 y.o. MRN#: [id number] Attending Physician: Cipriano Bunker, MD Primary Care Physician: Juluis Rainier, MD Admit Date: 03/13/2021  Reason for Consultation/Follow-up: Establishing goals of care  Subjective: Patient continues to appear pale and lethargic.  She does not follow commands.  Appears to have generalized edema.  Withdraws to pain, discussed with nursing colleagues.   Length of Stay: 6  Current Medications: Scheduled Meds:  . chlorhexidine  15 mL Mouth Rinse BID  . Chlorhexidine Gluconate Cloth  6 each Topical Daily  . feeding supplement (PROSource TF)  45 mL Per Tube BID  . free water  30 mL Per Tube Q6H  . furosemide  40 mg Intravenous Q12H  . heparin  5,000 Units Subcutaneous Q8H  . mouth rinse  15 mL Mouth Rinse BID  . nystatin   Topical BID  . sodium bicarbonate  650 mg Oral TID    Continuous Infusions: . sodium chloride 10 mL/hr at 03/28/21 1305  . feeding supplement (OSMOLITE 1.5 CAL) 1,000 mL (03/28/21 0839)    PRN Meds: acetaminophen **OR** acetaminophen, hydrALAZINE, ondansetron **OR** ondansetron (ZOFRAN) IV  Physical Exam         Patient appears with generalized weakness Some generalized edema Appears pale and weak, chronically ill Monitor noted S1-S2 Does not respond, does not verbalize Withdraws to pain Vital Signs: BP (!) 159/49 (BP Location: Right Arm)   Pulse 92   Temp (!) 97.52 F (36.4 C) (Bladder)   Resp 11   Wt 76.9 kg   SpO2 93%   BMI 28.21 kg/m  SpO2: SpO2: 93 % O2 Device: O2 Device: Nasal Cannula O2 Flow Rate: O2 Flow Rate (L/min): 2 L/min  Intake/output summary:  Intake/Output Summary (Last 24 hours) at 03/28/2021 1316 Last  data filed at 03/28/2021 1305 Gross per 24 hour  Intake 432.68 ml  Output 100 ml  Net 332.68 ml   LBM: Last BM Date: 03/26/21 Baseline Weight: Weight: 71.6 kg Most recent weight: Weight: 76.9 kg      Palliative performance scale 20%. Palliative Assessment/Data:      Patient Active Problem List   Diagnosis Date Noted  . Pressure ulcer of left leg 03/23/2021  . Severe sepsis (HCC) 03/10/2021  .  AKI (acute kidney injury) (HCC) 04-12-2021  . Hyperkalemia Apr 12, 2021  . Hyponatremia 04-12-2021  . Hypoglycemia Apr 12, 2021  . Acute cholecystitis 10/20/2020  . Acute hypoxemic respiratory failure due to COVID-19 (HCC) 10/16/2020  . Acute metabolic encephalopathy 10/16/2020  . Gross hematuria   . Acute sepsis (HCC)   . Sepsis secondary to UTI (HCC)   . Diarrhea   . Elevated sed rate 08/12/2019  . Elevated C-reactive protein (CRP) 08/12/2019  . Left-sided neglect 08/01/2019  . Myalgia 08/01/2019  . Pressure injury of skin 03/04/2019  . Acute lower UTI   . Stroke (HCC) 03/02/2019  . Acute CVA (cerebrovascular accident) (HCC) 03/01/2019  . Hypertensive urgency 12/19/2018  . History of myelitis 12/26/2017  . History of optic neuritis 04/02/2015  . Diplopia 04/02/2015  . Urinary incontinence 04/02/2015  . Other fatigue 04/02/2015  . 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 Numbness 04/02/2015  . Ataxic gait 04/02/2015  . Essential tremor 04/02/2015  . PRIMARY HYPERPARATHYROIDISM 02/01/2008  . OSTEOPOROSIS 02/01/2008  . Type 2 diabetes mellitus with hypoglycemia without coma (HCC) 01/31/2008  . Hyperlipidemia 01/31/2008  . Multiple sclerosis (HCC) 01/31/2008  . Optic neuritis 01/31/2008  . Essential hypertension 01/31/2008  . GERD 01/31/2008    Palliative Care Assessment & Plan   Patient Profile:    Assessment:  75 year old lady from [facility], known to palliative service seen in consultation in previous hospitalizations, admitted  from facility with hypoglycemia and encephalopathy.  Reportedly noted to have been lethargic and having diarrhea and low blood sugars at nursing facility.  Admitted to hospital medicine service with neurology, nephrology and critical care specialist following.  Remains near obtunded in the ICU at [facility] in [place], [place], palliative consultation has been requested again for ongoing goals of care discussions.  Recommendations/Plan: Call placed and discussed with Sister Janice Little.  Discussed concerns that the patient remains admitted with ongoing signs of decline and decompensation.  Admitted with possibly severe sepsis presumed secondary to urine tract infection.  Started on broad-spectrum antibiotics as well as completed 5 days of antibiotics for urine tract infection.  Also has acute kidney injury multifactorial, renal services were following, patient also with electrolyte derangements.  Concern for ongoing encephalopathy.  Discussed frankly but compassionately with [name] Janice Little about my concerns that this occurred to be hypoactive terminal delirium which is of multifactorial etiology.  Hence, from a palliative care standpoint, recommendation would be for instituting full scope of comfort measures.  To this end, we discussed about current partial code.  [name] states that it is in line with the patient's wishes and that was previously discussed that the patient should be partial code: She desires CPR and all other interventions except for intubation and mechanical ventilation.  We discussed extensively about what a CODE BLUE and ACLS protocol entails.  We contrasted that with instituting pain and on pain symptom management and comfort focused care.  Sister Janice Little states that she absolutely has to discuss this with the patient's son before she makes any decisions.  Patient's son is currently on a nursing assignment in [place] and works night shifts as an Education officer, environmental.  She will try to get in touch with him and discussed all of the above.  I also requested Dr. Lucianne Muss to reach out to the patient's sister, I have tried to answer her questions to the best of my ability.   Code Status:    Code Status Orders  (From admission, onward)  Start     Ordered   03/23/21 0046  Limited resuscitation (code)  Continuous       Question Answer Comment  In the event of cardiac or respiratory ARREST: Initiate Code Blue, Call Rapid Response Yes   In the event of cardiac or respiratory ARREST: Perform CPR Yes   In the event of cardiac or respiratory ARREST: Perform Intubation/Mechanical Ventilation No   In the event of cardiac or respiratory ARREST: Use NIPPV/BiPAp only if indicated Yes   In the event of cardiac or respiratory ARREST: Administer ACLS medications if indicated Yes   In the event of cardiac or respiratory ARREST: Perform Defibrillation or Cardioversion if indicated Yes      03/23/21 0045           Code Status History     Date Active Date Inactive Code Status Order ID Comments User Context   10/16/2020 1747 10/24/2020 0300 Partial Code [id number]  Jae Dire, MD ED   09/16/2019 1138 09/25/2019 0333 Full Code [id number]  Josephine Igo, DO ED   03/01/2019 0133 03/06/2019 2142 Full Code [id number]  Eduard Clos, MD ED   12/19/2018 1951 12/26/2018 1602 Full Code [id number]  Elgergawy, Leana Roe, MD ED       Prognosis:  Guarded   Discharge Planning: Anticipated Hospital Death  Care plan was discussed with  patient's sister Janice Little on the phone.   Thank you for allowing the Palliative Medicine Team to assist in the care of this patient.   Time In: 12.30 Time Out: 1305 Total Time 35 Prolonged Time Billed  no       Greater than 50%  of this time was spent counseling and coordinating care related to the above assessment and plan.  Rosalin Hawking, MD  Please contact Palliative Medicine Team phone at [phone] for questions and  concerns.

## 2021-03-28 NOTE — Progress Notes (Signed)
PROGRESS NOTE    LOVELL ROE  URK:[id number] DOB: November 14, 1945 DOA: 01-Apr-2021 PCP: Juluis Rainier, MD   Brief Narrative:  This 75 y.o. female with PMH significant of diabetes, chronic kidney disease stage III, chronic pain syndrome, chronic respiratory failure secondary to COPD, multiple sclerosis, GERD, essential hypertension and primary hyperparathyroidism s/p surgery who presents to the ED with hypoglycemia by EMS.  Patient apparently started having diarrhea  at [facility].  Her blood glucose was noted to be 34 then 70 and 25.  She was given glucagon x 4 . She has not received any insulin at SNF.  When patient arrived the ER she appeared drowsy, unable to give any history.  Her initial blood sugar was 46.  She was given D10 and blood sugar went to 130.  She is so lethargic ,not much history was obtained.  Patient was noted to be in acute kidney failure with creatinine more than 4.0  It was previously normal. She was found to have hyperkalemia as well as lactic acidosis which has resolved.  Suspected pyuria and severe sepsis.  Her blood pressure is however stable.  She is hypoxic but has chronically been on 2 L of oxygen.   She is being admitted as a case of severe sepsis with multiple electrolyte abnormalities.  Critical care , Nephrology and Neurology is consulted. Patient continues to remain lethargic, brief EEG negative for seizures, renal function has plateaud,  no further improvement.  Her urine output is decreasing.  Palliative care consulted to discuss goals of care.  Patient has poor prognosis.   Assessment & Plan:   Principal Problem:   Severe sepsis (HCC) Active Problems:   Type 2 diabetes mellitus with hypoglycemia without coma (HCC)   Hyperlipidemia   Multiple sclerosis (HCC)   Essential hypertension   GERD   Acute lower UTI   Acute metabolic encephalopathy   AKI (acute kidney injury) (HCC)   Hyperkalemia   Hyponatremia   Hypoglycemia   Pressure ulcer of left  leg  Severe sepsis presumed to be secondary to the UTI: Patient presented with tachycardia, tachypnea, hypotension,  urinary retention, elevated lactic acid. UA: LE - Nitrite -, Many bacteria Started on empiric antibiotics vancomycin and cefepime(MRSA nares+) Blood culture: No growth so far, urine cultures grew lactobacillus. Discontinue IV hydration.  Lactic acid> improved. Discontinue ceftriaxone for UTI. Completed x 5 days.  Hyperkalemia  : Could be due to acute renal failure.  Due to dehydration in the setting of diarrhea Treated with calcium gluconate, Dextrose, Insulin and Lokelma,  potassium has improved. continue to monitor.  Acute Kidney injury : Multifactorial: Patient presented with serum creatinine up to 4.3,  Baseline creatinine normal. This could be multifactorial in the setting of sepsis and dehydration. Nephrology is following , creatinine is stable at 3.2 Avoid nephrotoxic medications, creatinine trending down 3.28. Patient is not short term or Long-term CRRT candidate. Nephro suggested palliative care to discuss goals of care. Nephrology signed off,  states cannot offer much.  Suspected stercoral colitis: Seen on CT scan.  Continue glycerin suppository   Hypoglycemia : Resolved. Could be due to insulin accumulation in the setting of acute renal failure which is improving. Hold home insulin.  Blood glucose is improving.   Hyponatremia: > Improved. Due to dehydration,  continue to monitor. Continue gentle IV hydration.   History of multiple sclerosis : Stable.  History of CVA:  Continue home regimen when she is stable.  Acute metabolic encephalopathy could be secondary to acute illness: She  continues to remain lethargic, ammonia level normal, ABG unremarkable. Neurology consulted, EEG : No evidence of seizures. Neurology thinks encephalopathy is due to electrolyte imbalance and uremia. Palliative care consulted to discuss goals of care.   Family wants to  continue current scope of care. Patient has poor prognosis.  Insulin-dependent diabetes:  Hold all insulin and her metformin due to hypoglycemia.   Monitor capillary blood glucose closely.   Essential hypertension: Hydralazine 5mg  IV q6hrs prn  GERD : resume PPI  Chronic pain syndrome: Hold Cymbalta and gabapentin due to AMS  Left leg decubitus ulcer - local wound care  Microcytic hypochromic anemia: Hemoglobin dropped from 7.2- 6.9.  s/p 1 PRBC hemoglobin 9.4> 8.3 > 8.4  There is no visible signs of any obvious bleeding.  Continue to monitor H&H  Goals of care discussion: Palliative care is on board,  Discussions in progress about care. Poor prognosis.   DVT prophylaxis: Heparin Code Status: Full code. Family Communication:  spoke with daughter at phone.  Disposition Plan:   Status is: Inpatient  Remains inpatient appropriate because:Inpatient level of care appropriate due to severity of illness  Dispo: The patient is from: SNF              Anticipated d/c is to: SNF              Patient currently is not medically stable to d/c.   Difficult to place patient No  Consultants:  Nephrology Neurology PCCM  Procedures:  Antimicrobials:   Anti-infectives (From admission, onward)    Start     Dose/Rate Route Frequency Ordered Stop   03/24/21 2200  cefTRIAXone (ROCEPHIN) 2 g in sodium chloride 0.9 % 100 mL IVPB  Status:  Discontinued        2 g 200 mL/hr over 30 Minutes Intravenous Every 24 hours 03/24/21 0936 03/28/21 1109   03/23/21 2200  cefTRIAXone (ROCEPHIN) 1 g in sodium chloride 0.9 % 100 mL IVPB  Status:  Discontinued        1 g 200 mL/hr over 30 Minutes Intravenous Every 24 hours 03/23/21 0022 03/23/21 0916   03/23/21 2200  ceFEPIme (MAXIPIME) 2 g in sodium chloride 0.9 % 100 mL IVPB  Status:  Discontinued        2 g 200 mL/hr over 30 Minutes Intravenous Every 24 hours 03/23/21 0945 03/24/21 0936   03/23/21 0145  ceFEPIme (MAXIPIME) 2 g in sodium  chloride 0.9 % 100 mL IVPB  Status:  Discontinued        2 g 200 mL/hr over 30 Minutes Intravenous  Once 03/23/21 0045 03/23/21 0046   04-20-2021 2130  ceFEPIme (MAXIPIME) 2 g in sodium chloride 0.9 % 100 mL IVPB        2 g 200 mL/hr over 30 Minutes Intravenous  Once Apr 20, 2021 2121 03/23/21 0046   20-Apr-2021 2130  metroNIDAZOLE (FLAGYL) IVPB 500 mg        500 mg 100 mL/hr over 60 Minutes Intravenous  Once 2021-04-20 2121 03/23/21 0130   04/20/21 2130  vancomycin (VANCOCIN) IVPB 1000 mg/200 mL premix        1,000 mg 200 mL/hr over 60 Minutes Intravenous  Once 04/20/2021 2121 03/23/21 0210        Subjective: Patient was seen and examined at bedside.  Overnight events noted. She is chronically ill looking, she continues to remain lethargic, nonverbal at baseline. RN reports her urine output is less than 100 cc in the night. She opens eyes on painful  stimuli.  Objective: Vitals:   03/28/21 0300 03/28/21 0500 03/28/21 0800 03/28/21 1000  BP: (!) 188/78 (!) 157/55 (!) 163/58 (!) 158/67  Pulse: 94 98 99 98  Resp:   11 13  Temp: 98.24 F (36.8 C) 98.06 F (36.7 C) (!) 97.52 F (36.4 C) (!) 97.34 F (36.3 C)  TempSrc: Bladder Bladder Bladder   SpO2: 98% 94% 94% 93%  Weight:        Intake/Output Summary (Last 24 hours) at 03/28/2021 1114 Last data filed at 03/28/2021 1110 Gross per 24 hour  Intake 461.16 ml  Output 100 ml  Net 361.16 ml   Filed Weights   14-Apr-2021 2301 03/24/21 0500 03/25/21 0500  Weight: 71.6 kg 77.6 kg 76.9 kg    Examination:  General exam: Appears calm and comfortable , does not appear in any acute distress. Respiratory system: Mild baseline crackles on auscultation. Respiratory effort normal. Cardiovascular system: S1 & S2 heard, RRR. No JVD, murmurs, rubs, gallops or clicks. No pedal edema. Gastrointestinal system: Abdomen is nondistended, soft and nontender. No organomegaly or masses felt. Normal bowel sounds heard. Central nervous system: opens eyes but non  verbal . No focal neurological deficits. Extremities: No edema, no cyanosis, no clubbing. Skin: No rashes, lesions or ulcers Psychiatry: not assessed.    Data Reviewed: I have personally reviewed following labs and imaging studies  CBC: Recent Labs  Lab April 14, 2021 1848 03/23/21 0312 03/24/21 1614 03/25/21 0258 03/26/21 0256 03/27/21 0329 03/28/21 0258  WBC 23.3* 17.4*  --  9.8 8.1 9.1  --   NEUTROABS 20.5*  --   --   --   --   --   --   HGB 7.8* 7.3* 6.9* 9.4* 8.3* 8.4* 8.5*  HCT 26.3* 24.1* 22.2* 29.0* 26.0* 27.1* 26.2*  MCV 79.0* 78.0*  --  77.7* 80.7 81.9  --   PLT 473* 404*  --  328 278 322  --    Basic Metabolic Panel: Recent Labs  Lab 03/23/21 1156 03/23/21 1526 03/23/21 2020 03/24/21 0320 03/24/21 0853 03/24/21 1412 03/25/21 0258 03/26/21 0256 03/26/21 0831 03/27/21 0329 03/28/21 0258  NA 128*  --    < >  --    < > 129* 130* 134*  --  133* 136  K 6.2*  --    < >  --    < > 4.7 4.5 5.4* 5.5* 4.9 4.3  CL 100  --    < >  --    < > 101 100 103  --  102 105  CO2 16*  --    < >  --    < > 18* 20* 21*  --  22 23  GLUCOSE 47*  --    < >  --    < > 239* 165* 201*  --  106* 154*  BUN 74*  --    < >  --    < > 78* 78* 90*  --  100* 111*  CREATININE 3.78*  --    < >  --    < > 3.48* 3.37* 3.28*  --  3.26* 3.38*  CALCIUM 8.3*  --    < >  --    < > 7.8* 8.0* 8.2*  --  8.5* 8.6*  MG 1.2* 1.2*  --  1.2*  --   --  2.2 2.1  --   --   --   PHOS 3.7 3.7  --  3.3  --   --  3.7 3.3  --   --   --    < > = values in this interval not displayed.   GFR: Estimated Creatinine Clearance: 14.5 mL/min (A) (by C-G formula based on SCr of 3.38 mg/dL (H)). Liver Function Tests: Recent Labs  Lab November 23, 2020 1848 03/23/21 0312  AST 21 22  ALT 16 13  ALKPHOS 56 56  BILITOT 0.3 0.4  PROT 6.1* 5.3*  ALBUMIN 2.9* 2.7*   No results for input(s): LIPASE, AMYLASE in the last 168 hours. Recent Labs  Lab 03/23/21 0421 03/26/21 0831  AMMONIA 48* 29   Coagulation Profile: Recent Labs   Lab 03/23/21 0312  INR 1.2   Cardiac Enzymes: No results for input(s): CKTOTAL, CKMB, CKMBINDEX, TROPONINI in the last 168 hours. BNP (last 3 results) No results for input(s): PROBNP in the last 8760 hours. HbA1C: No results for input(s): HGBA1C in the last 72 hours. CBG: Recent Labs  Lab 03/27/21 1534 03/27/21 1936 03/27/21 2322 03/28/21 0337 03/28/21 0850  GLUCAP 130* 118* 134* 156* 173*   Lipid Profile: No results for input(s): CHOL, HDL, LDLCALC, TRIG, CHOLHDL, LDLDIRECT in the last 72 hours. Thyroid Function Tests: Recent Labs    03/26/21 1404  TSH 1.655   Anemia Panel: Recent Labs    03/26/21 1404  VITAMINB12 187   Sepsis Labs: Recent Labs  Lab 03/23/21 0312 03/23/21 0417 03/23/21 0822 03/23/21 1526 03/24/21 0320  PROCALCITON 6.48  --   --   --   --   LATICACIDVEN  --  6.9* 4.5* 4.6* 2.1*    Recent Results (from the past 240 hour(s))  Culture, blood (Routine X 2) w Reflex to ID Panel     Status: None   Collection Time: November 23, 2020  7:42 PM   Specimen: BLOOD  Result Value Ref Range Status   Specimen Description   Final    BLOOD LEFT ANTECUBITAL Performed at [facility], [address] [place], [place] [address]    Special Requests   Final    BOTTLES DRAWN AEROBIC AND ANAEROBIC Blood Culture adequate volume Performed at [facility], [address] [place], [place] [address]    Culture   Final    NO GROWTH 5 DAYS Performed at [facility], [address] [place], [place] [address]    Report Status 03/27/2021 FINAL  Final  Urine Culture     Status: Abnormal   Collection Time: November 23, 2020  8:30 PM   Specimen: Urine, Random  Result Value Ref Range Status   Specimen Description   Final    URINE, RANDOM Performed at [facility], [address] [place], [place] [address]    Special Requests   Final    NONE Performed at [facility], [address]  [place], [place] [address]    Culture (A)  Final    70,000 COLONIES/mL LACTOBACILLUS SPECIES Standardized susceptibility testing for this organism is not available. Performed at [facility], [address] [place], [place] [address]    Report Status 03/24/2021 FINAL  Final  Resp Panel by RT-PCR (Flu A&B, Covid) Nasopharyngeal Swab     Status: None   Collection Time: November 23, 2020  8:54 PM   Specimen: Nasopharyngeal Swab; Nasopharyngeal(NP) swabs in vial transport medium  Result Value Ref Range Status   SARS Coronavirus 2 by RT PCR NEGATIVE NEGATIVE Final    Comment: (NOTE) SARS-CoV-2 target nucleic acids are NOT DETECTED.  The SARS-CoV-2 RNA is  generally detectable in upper respiratory specimens during the acute phase of infection. The lowest concentration of SARS-CoV-2 viral copies this assay can detect is 138 copies/mL. A negative result does not preclude SARS-Cov-2 infection and should not be used as the sole basis for treatment or other patient management decisions. A negative result may occur with  improper specimen collection/handling, submission of specimen other than nasopharyngeal swab, presence of viral mutation(s) within the areas targeted by this assay, and inadequate number of viral copies(<138 copies/mL). A negative result must be combined with clinical observations, patient history, and epidemiological information. The expected result is Negative.  Fact Sheet for Patients:  BloggerCourse.com  Fact Sheet for Healthcare Providers:  SeriousBroker.it  This test is no t yet approved or cleared by the [place] FDA and  has been authorized for detection and/or diagnosis of SARS-CoV-2 by FDA under an Emergency Use Authorization (EUA). This EUA will remain  in effect (meaning this test can be used) for the duration of the COVID-19 declaration under Section 564(b)(1) of the Act, 21 U.S.C.section 360bbb-3(b)(1), unless the  authorization is terminated  or revoked sooner.       Influenza A by PCR NEGATIVE NEGATIVE Final   Influenza B by PCR NEGATIVE NEGATIVE Final    Comment: (NOTE) The Xpert Xpress SARS-CoV-2/FLU/RSV plus assay is intended as an aid in the diagnosis of influenza from Nasopharyngeal swab specimens and should not be used as a sole basis for treatment. Nasal washings and aspirates are unacceptable for Xpert Xpress SARS-CoV-2/FLU/RSV testing.  Fact Sheet for Patients: BloggerCourse.com  Fact Sheet for Healthcare Providers: SeriousBroker.it  This test is not yet approved or cleared by the [place] FDA and has been authorized for detection and/or diagnosis of SARS-CoV-2 by FDA under an Emergency Use Authorization (EUA). This EUA will remain in effect (meaning this test can be used) for the duration of the COVID-19 declaration under Section 564(b)(1) of the Act, 21 U.S.C. section 360bbb-3(b)(1), unless the authorization is terminated or revoked.  Performed at [facility], [address] [place], [place] [address]   Culture, blood (Routine X 2) w Reflex to ID Panel     Status: None   Collection Time: 04-11-21 10:15 PM   Specimen: BLOOD  Result Value Ref Range Status   Specimen Description BLOOD BLOOD RIGHT FOREARM  Final   Special Requests   Final    BOTTLES DRAWN AEROBIC AND ANAEROBIC Blood Culture results may not be optimal due to an excessive volume of blood received in culture bottles   Culture   Final    NO GROWTH 5 DAYS Performed at [facility], [address] [place], [place] [address]    Report Status 03/28/2021 FINAL  Final  MRSA PCR Screening     Status: Abnormal   Collection Time: 03/23/21 12:15 AM  Result Value Ref Range Status   MRSA by PCR POSITIVE (A) NEGATIVE Final    Comment:        The GeneXpert MRSA Assay (FDA approved for NASAL specimens only), is one component of  a comprehensive MRSA colonization surveillance program. It is not intended to diagnose MRSA infection nor to guide or monitor treatment for MRSA infections. RESULT CALLED TO, READ BACK BY AND VERIFIED WITH: STACY, RN @ 0241 ON 03/23/21 Riesa Pope Performed at [facility], [address], [place], [place] [address]    Radiology Studies: EEG adult  Result Date: 04/15/2021 Charlsie Quest, MD     04-15-2021  5:22 PM Patient Name: UCHECHI DENISON MRN: [id number] Epilepsy Attending: Charlsie Quest Referring Physician/Provider: Dr Erick Blinks Date: 03/26/2021 Duration: 50.14 mins Patient history: 75 year old female with history of MS and worsening mental status.  EEG to evaluate for seizures. Level of alertness:  comatose AEDs during EEG study: None Technical aspects: This EEG was obtained using a 10 lead EEG system positioned circumferentially without any parasagittal coverage (rapid EEG). Computer selected EEG is reviewed as well as background features and all clinically significant events. Description: No posterior dominant rhythm was seen.  EEG showed continuous generalized and lateralized right hemisphere 3 to 5 Hz theta-delta slowing.  Hyperventilation and photic stimulation were not performed.   ABNORMALITY - Continuous slow, generalized and lateralized right hemisphere IMPRESSION: This study is suggestive of cortical dysfunction in right hemisphere likely secondary to underlying structural abnormality/stroke.  Additionally there is evidence of moderate degree of encephalopathy, nonspecific etiology.  No seizures or epileptiform discharges were seen throughout the recording.  If suspicion for interictal activity remains a concern, a prolonged study with traditional eeg can be considered. Priyanka Annabelle Harman    Scheduled Meds:  chlorhexidine  15 mL Mouth Rinse BID   Chlorhexidine Gluconate Cloth  6 each Topical Daily   feeding supplement (PROSource TF)  45 mL Per Tube BID    free water  30 mL Per Tube Q6H   furosemide  40 mg Intravenous Q12H   heparin  5,000 Units Subcutaneous Q8H   mouth rinse  15 mL Mouth Rinse BID   sodium bicarbonate  650 mg Oral TID   Continuous Infusions:  sodium chloride 10 mL/hr at 03/28/21 1110   feeding supplement (OSMOLITE 1.5 CAL) 1,000 mL (03/28/21 0839)     LOS: 6 days    Time spent: 35 mins    Rudene Poulsen, MD [facility]   If 7PM-7AM, please contact night-coverage

## 2021-03-29 ENCOUNTER — Inpatient Hospital Stay (HOSPITAL_COMMUNITY)
Admit: 2021-03-29 | Discharge: 2021-03-29 | Disposition: A | Discharge status: Home / Self Care | Payer: Medicare Other | Attending: Student in an Organized Health Care Education/Training Program | Admitting: Student in an Organized Health Care Education/Training Program

## 2021-03-29 LAB — BASIC METABOLIC PANEL
Anion gap: 10 (ref 5–15)
BUN: 115 mg/dL — ABNORMAL HIGH (ref 8–23)
CO2: 25 mmol/L (ref 22–32)
Calcium: 8.8 mg/dL — ABNORMAL LOW (ref 8.9–10.3)
Chloride: 104 mmol/L (ref 98–111)
Creatinine, Ser: 3.24 mg/dL — ABNORMAL HIGH (ref 0.44–1.00)
GFR, Estimated: 14 mL/min — ABNORMAL LOW (ref >60)
Glucose, Bld: 218 mg/dL — ABNORMAL HIGH (ref 70–99)
Potassium: 3.7 mmol/L (ref 3.5–5.1)
Sodium: 139 mmol/L (ref 135–145)

## 2021-03-29 LAB — GLUCOSE, CAPILLARY
Glucose-Capillary: 222 mg/dL — ABNORMAL HIGH (ref 70–99)
Glucose-Capillary: 263 mg/dL — ABNORMAL HIGH (ref 70–99)
Glucose-Capillary: 244 mg/dL — ABNORMAL HIGH (ref 70–99)

## 2021-03-29 MED ORDER — INSULIN ASPART 100 UNIT/ML IJ SOLN: 0.0000 [IU] | INTRAMUSCULAR | Status: DC

## 2021-03-29 NOTE — TOC Progression Note (Signed)
Transition of Care [facility]) - Progression Note    Patient Details  Name: Janice Little MRN: [id number] Date of Birth: 12-29-1945  [facility]) CM/SW Contact  Golda Acre, RN Phone Number: 03/29/2021, 9:16 AM  Clinical Narrative:    Assessment:  75 year old lady from [facility] facility, known to palliative service seen in consultation in previous hospitalizations, admitted from facility with hypoglycemia and encephalopathy.  Reportedly noted to have been lethargic and having diarrhea and low blood sugars at nursing facility.  Admitted to hospital medicine service with neurology, nephrology and critical care specialist following.  Remains near obtunded in the ICU at [facility] in [place], [place], palliative consultation has been requested again for ongoing goals of care discussions.   Recommendations/Plan: Call placed and discussed with Sister [name].  Discussed concerns that the patient remains admitted with ongoing signs of decline and decompensation.  Admitted with possibly severe sepsis presumed secondary to urine tract infection.  Started on broad-spectrum antibiotics as well as completed 5 days of antibiotics for urine tract infection.  Also has acute kidney injury multifactorial, renal services were following, patient also with electrolyte derangements.  Concern for ongoing encephalopathy.  Discussed frankly but compassionately with [name] [name] about my concerns that this occurred to be hypoactive terminal delirium which is of multifactorial etiology.  Hence, from a palliative care standpoint, recommendation would be for instituting full scope of comfort measures.  To this end, we discussed about current partial code.  [name] states that it is in line with the patient's wishes and that was previously discussed that the patient should be partial code: She desires CPR and all other interventions except for intubation and mechanical  ventilation.  We discussed extensively about what a CODE BLUE and ACLS protocol entails.  We contrasted that with instituting pain and on pain symptom management and comfort focused care.  Sister [name] states that she absolutely has to discuss this with the patient's son before she makes any decisions.  Patient's son is currently on a nursing assignment in [place] and works night shifts as an Doctor, general practice.  She will try to get in touch with him and discussed all of the above.  I also requested Dr. Lucianne Muss to reach out to the patient's sister, I have tried to answer her questions to the best of my ability.  toc plan of care: Following for progression and patient wishes as to future care.    Expected Discharge Plan: Skilled Nursing Facility Barriers to Discharge: Continued Medical Work up  Expected Discharge Plan and Services Expected Discharge Plan: Skilled Nursing Facility       Living arrangements for the past 2 months: Skilled Nursing Facility                                       Social Determinants of Health (SDOH) Interventions    Readmission Risk Interventions Readmission Risk Prevention Plan 09/18/2019 12/24/2018  Post Dischage Appt - Complete  Medication Screening - Complete  Transportation Screening Complete Complete  PCP or Specialist Appt within 5-7 Days Complete -  Home Care Screening Complete -  Medication Review (RN CM) Complete -  Some recent data might be hidden

## 2021-03-29 NOTE — Progress Notes (Signed)
PROGRESS NOTE    Janice Little  ZOX:[id number] DOB: 24-Nov-1945 DOA: 03/11/2021 PCP: Juluis Rainier, MD   Brief Narrative:  This 75 y.o. female with PMH significant of diabetes, chronic kidney disease stage III, chronic pain syndrome, chronic respiratory failure secondary to COPD, multiple sclerosis, GERD, essential hypertension and primary hyperparathyroidism s/p surgery who presents to the ED with hypoglycemia by EMS.  Patient apparently started having diarrhea  at [facility].  Her blood glucose was noted to be 34 then 70 and 25.  She was given glucagon x 4. She has not received any insulin at SNF.  When patient arrived the ER she appeared drowsy, unable to give any history.  Her initial blood sugar was 46.  She was given D10 and blood sugar went to 130.  She is so lethargic ,not much history was obtained.  Patient was noted to be in acute kidney failure with creatinine more than 4.0  It was previously normal. She was found to have hyperkalemia as well as lactic acidosis which has resolved.  Suspected pyuria and severe sepsis.  Her blood pressure is however stable.  She is hypoxic but has chronically been on 2 L of oxygen.   She is being admitted as a case of severe sepsis with multiple electrolyte abnormalities.  Critical care , Nephrology and Neurology is consulted. Patient continues to remain lethargic, brief EEG negative for seizures, renal function has plateaud,  no further improvement.  Her urine output is decreasing.  Palliative care consulted to discuss goals of care.  Patient has poor prognosis.   Assessment & Plan:   Principal Problem:   Severe sepsis (HCC) Active Problems:   Type 2 diabetes mellitus with hypoglycemia without coma (HCC)   Hyperlipidemia   Multiple sclerosis (HCC)   Essential hypertension   GERD   Acute lower UTI   Acute metabolic encephalopathy   AKI (acute kidney injury) (HCC)   Hyperkalemia   Hyponatremia   Hypoglycemia   Pressure ulcer of left leg    Palliative care by specialist   Goals of care, counseling/discussion  Severe sepsis presumed to be secondary to the UTI: Patient presented with tachycardia, tachypnea, hypotension,  urinary retention, elevated lactic acid. UA: LE - Nitrite -, Many bacteria Started on empiric antibiotics vancomycin and cefepime(MRSA nares+) Blood culture: No growth so far, urine cultures grew lactobacillus. Discontinue IV hydration.  Lactic acid> improved. Discontinue ceftriaxone for UTI. Completed x 5 days.  Hyperkalemia  : Could be due to acute renal failure.  Due to dehydration in the setting of diarrhea Treated with calcium gluconate, Dextrose, Insulin and Lokelma,  potassium has improved. continue to monitor.  Acute Kidney injury : Multifactorial: Patient presented with serum creatinine up to 4.3,  Baseline creatinine normal. This could be multifactorial in the setting of sepsis and dehydration. Nephrology is following , creatinine is stable at 3.2 Avoid nephrotoxic medications, creatinine trending down 3.28. Patient is not short term or Long-term CRRT candidate. Nephro suggested palliative care to discuss goals of care. Nephrology signed off,  states cannot offer much.  Suspected stercoral colitis: Seen on CT scan.  Continue glycerin suppository   Hypoglycemia : Resolved. Could be due to insulin accumulation in the setting of acute renal failure which is improving. Hold home insulin.  Blood glucose is improving.   Hyponatremia: > Improved. Due to dehydration,  continue to monitor. Continue gentle IV hydration.   History of multiple sclerosis : Stable.  History of CVA:  Continue home regimen when she is stable.  Acute metabolic encephalopathy could be secondary to acute illness: She continues to remain lethargic, ammonia level normal, ABG unremarkable. Neurology consulted, EEG : No evidence of seizures. Neurology thinks encephalopathy is due to electrolyte imbalance and  uremia. Palliative care consulted to discuss goals of care.   Family wants to continue current scope of care. Patient has poor prognosis. Family agreed to make her DNR/DNI Palliative care remains on board to discuss further about comfort measures.  Insulin-dependent diabetes:  Hold all insulin and her metformin due to hypoglycemia.   Monitor capillary blood glucose closely.   Essential hypertension: Hydralazine 5mg  IV q6hrs prn  GERD : resume PPI  Chronic pain syndrome: Hold Cymbalta and gabapentin due to AMS  Left leg decubitus ulcer - local wound care  Microcytic hypochromic anemia: Hemoglobin dropped from 7.2- 6.9.  s/p 1 PRBC hemoglobin 9.4> 8.3 > 8.4  There is no visible signs of any obvious bleeding.  Continue to monitor H&H  Goals of care discussion: Palliative care is on board,  Discussions in progress about care. Poor prognosis.   DVT prophylaxis: Heparin Code Status: DNR Family Communication:  spoke with sister at phone.  Disposition Plan:   Status is: Inpatient  Remains inpatient appropriate because:Inpatient level of care appropriate due to severity of illness  Dispo: The patient is from: SNF              Anticipated d/c is to: SNF              Patient currently is not medically stable to d/c.   Difficult to place patient No  Consultants:  Nephrology Neurology PCCM  Procedures:  Antimicrobials:   Anti-infectives (From admission, onward)    Start     Dose/Rate Route Frequency Ordered Stop   03/24/21 2200  cefTRIAXone (ROCEPHIN) 2 g in sodium chloride 0.9 % 100 mL IVPB  Status:  Discontinued        2 g 200 mL/hr over 30 Minutes Intravenous Every 24 hours 03/24/21 0936 03/28/21 1109   03/23/21 2200  cefTRIAXone (ROCEPHIN) 1 g in sodium chloride 0.9 % 100 mL IVPB  Status:  Discontinued        1 g 200 mL/hr over 30 Minutes Intravenous Every 24 hours 03/23/21 0022 03/23/21 0916   03/23/21 2200  ceFEPIme (MAXIPIME) 2 g in sodium chloride 0.9 % 100  mL IVPB  Status:  Discontinued        2 g 200 mL/hr over 30 Minutes Intravenous Every 24 hours 03/23/21 0945 03/24/21 0936   03/23/21 0145  ceFEPIme (MAXIPIME) 2 g in sodium chloride 0.9 % 100 mL IVPB  Status:  Discontinued        2 g 200 mL/hr over 30 Minutes Intravenous  Once 03/23/21 0045 03/23/21 0046   2021/04/21 2130  ceFEPIme (MAXIPIME) 2 g in sodium chloride 0.9 % 100 mL IVPB        2 g 200 mL/hr over 30 Minutes Intravenous  Once 04/21/2021 2121 03/23/21 0046   2021/04/21 2130  metroNIDAZOLE (FLAGYL) IVPB 500 mg        500 mg 100 mL/hr over 60 Minutes Intravenous  Once April 21, 2021 2121 03/23/21 0130   21-Apr-2021 2130  vancomycin (VANCOCIN) IVPB 1000 mg/200 mL premix        1,000 mg 200 mL/hr over 60 Minutes Intravenous  Once 04-21-21 2121 03/23/21 0210        Subjective: Patient was seen and examined at bedside.  Overnight events noted. She is chronically ill looking,  she continues to remain lethargic, nonverbal at baseline. RN reports she remained lethargic  overnight,  She  doesn't open eyes on painful stimuli.  Objective: Vitals:   03/29/21 0900 03/29/21 1000 03/29/21 1100 03/29/21 1200  BP: (!) 170/54 (!) 181/65 (!) 193/70 (!) 200/81  Pulse: 94 98 (!) 200 (!) 101  Resp: 11 10 10 11   Temp: 97.7 F (36.5 C) 97.88 F (36.6 C) 98.06 F (36.7 C) 98.24 F (36.8 C)  TempSrc:      SpO2: 97% 95% 98% 96%  Weight:        Intake/Output Summary (Last 24 hours) at 03/29/2021 1216 Last data filed at 03/29/2021 0600 Gross per 24 hour  Intake 1498.65 ml  Output 2815 ml  Net -1316.35 ml   Filed Weights   03/24/21 0500 03/25/21 0500 03/29/21 0444  Weight: 77.6 kg 76.9 kg 77.3 kg    Examination:  General exam: Remains lethargic,  does not appear in any acute distress. Respiratory system: Mild baseline crackles on auscultation. Respiratory effort normal. Cardiovascular system: S1 & S2 heard, RRR. No JVD, murmurs, rubs, gallops or clicks. No pedal edema. Gastrointestinal system:  Abdomen is nondistended, soft and nontender. No organomegaly or masses felt. Normal bowel sounds heard. Central nervous system: Lethargic , non verbal . No focal neurological deficits. Extremities: No edema, no cyanosis, no clubbing. Skin: No rashes, lesions or ulcers Psychiatry: not assessed.    Data Reviewed: I have personally reviewed following labs and imaging studies  CBC: Recent Labs  Lab 01/06/2021 1848 03/23/21 0312 03/24/21 1614 03/25/21 0258 03/26/21 0256 03/27/21 0329 03/28/21 0258  WBC 23.3* 17.4*  --  9.8 8.1 9.1  --   NEUTROABS 20.5*  --   --   --   --   --   --   HGB 7.8* 7.3* 6.9* 9.4* 8.3* 8.4* 8.5*  HCT 26.3* 24.1* 22.2* 29.0* 26.0* 27.1* 26.2*  MCV 79.0* 78.0*  --  77.7* 80.7 81.9  --   PLT 473* 404*  --  328 278 322  --    Basic Metabolic Panel: Recent Labs  Lab 03/23/21 1156 03/23/21 1526 03/23/21 2020 03/24/21 0320 03/24/21 [id number] 03/25/21 0258 03/26/21 0256 03/26/21 0831 03/27/21 0329 03/28/21 0258 03/29/21 0256  NA 128*  --    < >  --    < > 130* 134*  --  133* 136 139  K 6.2*  --    < >  --    < > 4.5 5.4* 5.5* 4.9 4.3 3.7  CL 100  --    < >  --    < > 100 103  --  102 105 104  CO2 16*  --    < >  --    < > 20* 21*  --  22 23 25   GLUCOSE 47*  --    < >  --    < > 165* 201*  --  106* 154* 218*  BUN 74*  --    < >  --    < > 78* 90*  --  100* 111* 115*  CREATININE 3.78*  --    < >  --    < > 3.37* 3.28*  --  3.26* 3.38* 3.24*  CALCIUM 8.3*  --    < >  --    < > 8.0* 8.2*  --  8.5* 8.6* 8.8*  MG 1.2* 1.2*  --  1.2*  --  2.2 2.1  --   --   --   --  PHOS 3.7 3.7  --  3.3  --  3.7 3.3  --   --   --   --    < > = values in this interval not displayed.   GFR: Estimated Creatinine Clearance: 15.2 mL/min (A) (by C-G formula based on SCr of 3.24 mg/dL (H)). Liver Function Tests: Recent Labs  Lab 04-21-21 1848 03/23/21 0312  AST 21 22  ALT 16 13  ALKPHOS 56 56  BILITOT 0.3 0.4  PROT 6.1* 5.3*  ALBUMIN 2.9* 2.7*   No results for input(s):  LIPASE, AMYLASE in the last 168 hours. Recent Labs  Lab 03/23/21 0421 03/26/21 0831  AMMONIA 48* 29   Coagulation Profile: Recent Labs  Lab 03/23/21 0312  INR 1.2   Cardiac Enzymes: No results for input(s): CKTOTAL, CKMB, CKMBINDEX, TROPONINI in the last 168 hours. BNP (last 3 results) No results for input(s): PROBNP in the last 8760 hours. HbA1C: No results for input(s): HGBA1C in the last 72 hours. CBG: Recent Labs  Lab 03/28/21 2023 03/28/21 2357 03/29/21 0314 03/29/21 0755 03/29/21 1147  GLUCAP 214* 200* 222* 263* 244*   Lipid Profile: No results for input(s): CHOL, HDL, LDLCALC, TRIG, CHOLHDL, LDLDIRECT in the last 72 hours. Thyroid Function Tests: Recent Labs    03/26/21 1404  TSH 1.655   Anemia Panel: Recent Labs    03/26/21 1404  VITAMINB12 187   Sepsis Labs: Recent Labs  Lab 03/23/21 0312 03/23/21 0417 03/23/21 0822 03/23/21 1526 03/24/21 0320  PROCALCITON 6.48  --   --   --   --   LATICACIDVEN  --  6.9* 4.5* 4.6* 2.1*    Recent Results (from the past 240 hour(s))  Culture, blood (Routine X 2) w Reflex to ID Panel     Status: None   Collection Time: 2021/04/21  7:42 PM   Specimen: BLOOD  Result Value Ref Range Status   Specimen Description   Final    BLOOD LEFT ANTECUBITAL Performed at [facility], [address] [place], [place] [address]    Special Requests   Final    BOTTLES DRAWN AEROBIC AND ANAEROBIC Blood Culture adequate volume Performed at [facility], [address], [place], [place] [address]    Culture   Final    NO GROWTH 5 DAYS Performed at [facility], [address] [place], [place] [address]    Report Status 03/27/2021 FINAL  Final  Urine Culture     Status: Abnormal   Collection Time: 04/21/21  8:30 PM   Specimen: Urine, Random  Result Value Ref Range Status   Specimen Description   Final    URINE, RANDOM Performed at [facility], [address], [place], [place] [address]    Special Requests   Final    NONE Performed at [facility], [address] [place], [place] [address]    Culture (A)  Final    70,000 COLONIES/mL LACTOBACILLUS SPECIES Standardized susceptibility testing for this organism is not available. Performed at [facility], [address], [place], [place] [address]    Report Status 03/24/2021 FINAL  Final  Resp Panel by RT-PCR (Flu A&B, Covid) Nasopharyngeal Swab     Status: None   Collection Time: April 21, 2021  8:54 PM   Specimen: Nasopharyngeal Swab; Nasopharyngeal(NP) swabs in vial transport medium  Result Value Ref Range Status   SARS Coronavirus 2 by RT PCR NEGATIVE NEGATIVE Final    Comment: (  NOTE) SARS-CoV-2 target nucleic acids are NOT DETECTED.  The SARS-CoV-2 RNA is generally detectable in upper respiratory specimens during the acute phase of infection. The lowest concentration of SARS-CoV-2 viral copies this assay can detect is 138 copies/mL. A negative result does not preclude SARS-Cov-2 infection and should not be used as the sole basis for treatment or other patient management decisions. A negative result may occur with  improper specimen collection/handling, submission of specimen other than nasopharyngeal swab, presence of viral mutation(s) within the areas targeted by this assay, and inadequate number of viral copies(<138 copies/mL). A negative result must be combined with clinical observations, patient history, and epidemiological information. The expected result is Negative.  Fact Sheet for Patients:  BloggerCourse.com  Fact Sheet for Healthcare Providers:  SeriousBroker.it  This test is no t yet approved or cleared by the [place] FDA and  has been authorized for detection and/or diagnosis of SARS-CoV-2 by FDA under an Emergency Use Authorization (EUA). This EUA will remain  in effect (meaning this test can  be used) for the duration of the COVID-19 declaration under Section 564(b)(1) of the Act, 21 U.S.C.section 360bbb-3(b)(1), unless the authorization is terminated  or revoked sooner.       Influenza A by PCR NEGATIVE NEGATIVE Final   Influenza B by PCR NEGATIVE NEGATIVE Final    Comment: (NOTE) The Xpert Xpress SARS-CoV-2/FLU/RSV plus assay is intended as an aid in the diagnosis of influenza from Nasopharyngeal swab specimens and should not be used as a sole basis for treatment. Nasal washings and aspirates are unacceptable for Xpert Xpress SARS-CoV-2/FLU/RSV testing.  Fact Sheet for Patients: BloggerCourse.com  Fact Sheet for Healthcare Providers: SeriousBroker.it  This test is not yet approved or cleared by the [place] FDA and has been authorized for detection and/or diagnosis of SARS-CoV-2 by FDA under an Emergency Use Authorization (EUA). This EUA will remain in effect (meaning this test can be used) for the duration of the COVID-19 declaration under Section 564(b)(1) of the Act, 21 U.S.C. section 360bbb-3(b)(1), unless the authorization is terminated or revoked.  Performed at [facility], [address] [place], [place] [address]   Culture, blood (Routine X 2) w Reflex to ID Panel     Status: None   Collection Time: 03/26/2021 10:15 PM   Specimen: BLOOD  Result Value Ref Range Status   Specimen Description BLOOD BLOOD RIGHT FOREARM  Final   Special Requests   Final    BOTTLES DRAWN AEROBIC AND ANAEROBIC Blood Culture results may not be optimal due to an excessive volume of blood received in culture bottles   Culture   Final    NO GROWTH 5 DAYS Performed at [facility], [address] [place], [place] [address]    Report Status 03/28/2021 FINAL  Final  MRSA PCR Screening     Status: Abnormal   Collection Time: 03/23/21 12:15 AM  Result Value Ref Range Status   MRSA by PCR POSITIVE (A)  NEGATIVE Final    Comment:        The GeneXpert MRSA Assay (FDA approved for NASAL specimens only), is one component of a comprehensive MRSA colonization surveillance program. It is not intended to diagnose MRSA infection nor to guide or monitor treatment for MRSA infections. RESULT CALLED TO, READ BACK BY AND VERIFIED WITH: STACY, RN @ 0241 ON 03/23/21 Riesa Pope Performed at [facility], [address] [place], [place] [address]    Radiology Studies: No results  found.  Scheduled Meds:  chlorhexidine  15 mL Mouth Rinse BID   Chlorhexidine Gluconate Cloth  6 each Topical Daily   feeding supplement (PROSource TF)  45 mL Per Tube BID   free water  30 mL Per Tube Q6H   furosemide  40 mg Intravenous Q12H   heparin  5,000 Units Subcutaneous Q8H   insulin aspart  0-9 Units Subcutaneous Q4H   mouth rinse  15 mL Mouth Rinse BID   nystatin   Topical BID   sodium bicarbonate  650 mg Oral TID   Continuous Infusions:  sodium chloride Stopped (03/29/21 0100)   feeding supplement (OSMOLITE 1.5 CAL) 1,000 mL (03/29/21 0928)     LOS: 7 days    Time spent: 35 mins    Anglia Blakley, MD [facility]   If 7PM-7AM, please contact night-coverage

## 2021-03-29 NOTE — Procedures (Signed)
Patient Name: Janice Little  MRN: [id number]  Epilepsy Attending: Charlsie Quest  Referring Physician/Provider: Dr. Milon Dikes Date: 03/29/2021 Duration: 23.04 minutes  Patient history: 75 year old female with history of MS and worsening mental status.  EEG to evaluate for seizures  Level of alertness:  lethargic   AEDs during EEG study: None  Technical aspects: This EEG study was done with scalp electrodes positioned according to the 10-20 International system of electrode placement. Electrical activity was acquired at a sampling rate of 500Hz  and reviewed with a high frequency filter of 70Hz  and a low frequency filter of 1Hz . EEG data were recorded continuously and digitally stored.   Description: No posterior dominant rhythm was seen. EEG showed continuous generalized 3-5Hz  theta-delta slowing.  Intermittent generalized periodic discharges with triphasic morphology at 1 Hz were note. Hyperventilation and photic stimulation were not performed.     ABNORMALITY - Periodic discharges with triphasic morphology, generalized ( GPDs) - Continuous slow, generalized  IMPRESSION: This study showed generalized periodic discharges with triphasic morphology which is on the ictal-interictal continuum.  However given the morphology and frequency, it is more likely due to toxic-metabolic causes.  Additionally there is moderate diffuse to severe diffuse encephalopathy, nonspecific etiology. No seizures were seen throughout the recording.  Maxwell Martorano 

## 2021-03-29 NOTE — Progress Notes (Signed)
Daily Progress Note   Patient Name: Janice Little       Date: 03/29/2021 DOB: May 28, 1946  Age: 75 y.o. MRN#: [id number] Attending Physician: Cipriano Bunker, MD Primary Care Physician: Juluis Rainier, MD Admit Date: 03/23/2021  Reason for Consultation/Follow-up: Establishing goals of care  Subjective: Patient continues to appear pale and lethargic.  She does not follow commands.  Appears to have generalized edema. Overall condition is lethargic,discussed with nursing colleagues.   Length of Stay: 7  Current Medications: Scheduled Meds:  . chlorhexidine  15 mL Mouth Rinse BID  . Chlorhexidine Gluconate Cloth  6 each Topical Daily  . feeding supplement (PROSource TF)  45 mL Per Tube BID  . free water  30 mL Per Tube Q6H  . furosemide  40 mg Intravenous Q12H  . heparin  5,000 Units Subcutaneous Q8H  . insulin aspart  0-9 Units Subcutaneous Q4H  . mouth rinse  15 mL Mouth Rinse BID  . nystatin   Topical BID  . sodium bicarbonate  650 mg Oral TID    Continuous Infusions: . sodium chloride Stopped (03/29/21 0100)  . feeding supplement (OSMOLITE 1.5 CAL) 1,000 mL (03/29/21 0928)    PRN Meds: acetaminophen **OR** acetaminophen, hydrALAZINE, ondansetron **OR** ondansetron (ZOFRAN) IV  Physical Exam         Patient appears with generalized weakness Some generalized edema Appears pale and weak, chronically ill Monitor noted S1-S2 Does not respond, does not verbalize   Vital Signs: BP (!) 198/78   Pulse (!) 103   Temp 98.24 F (36.8 C)   Resp 12   Wt 77.3 kg   SpO2 96%   BMI 28.36 kg/m  SpO2: SpO2: 96 % O2 Device: O2 Device: Nasal Cannula O2 Flow Rate: O2 Flow Rate (L/min): 2 L/min  Intake/output summary:  Intake/Output Summary (Last 24 hours) at 03/29/2021  1324 Last data filed at 03/29/2021 0600 Gross per 24 hour  Intake 1479.63 ml  Output 2815 ml  Net -1335.37 ml    LBM: Last BM Date: 03/29/21 Baseline Weight: Weight: 71.6 kg Most recent weight: Weight: 77.3 kg      Palliative performance scale 20%. Palliative Assessment/Data:      Patient Active Problem List   Diagnosis Date Noted  . Palliative care by specialist   . Goals of care, counseling/discussion   .  Pressure ulcer of left leg 03/23/2021  . Severe sepsis (HCC) 03/31/21  . AKI (acute kidney injury) (HCC) March 31, 2021  . Hyperkalemia 2021-03-31  . Hyponatremia 03-31-2021  . Hypoglycemia March 31, 2021  . Acute cholecystitis 10/20/2020  . Acute hypoxemic respiratory failure due to COVID-19 (HCC) 10/16/2020  . Acute metabolic encephalopathy 10/16/2020  . Gross hematuria   . Acute sepsis (HCC)   . Sepsis secondary to UTI (HCC)   . Diarrhea   . Elevated sed rate 08/12/2019  . Elevated C-reactive protein (CRP) 08/12/2019  . Left-sided neglect 08/01/2019  . Myalgia 08/01/2019  . Pressure injury of skin 03/04/2019  . Acute lower UTI   . Stroke (HCC) 03/02/2019  . Acute CVA (cerebrovascular accident) (HCC) 03/01/2019  . Hypertensive urgency 12/19/2018  . History of myelitis 12/26/2017  . History of optic neuritis 04/02/2015  . Diplopia 04/02/2015  . Urinary incontinence 04/02/2015  . Other fatigue 04/02/2015  . 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 Numbness 04/02/2015  . Ataxic gait 04/02/2015  . Essential tremor 04/02/2015  . PRIMARY HYPERPARATHYROIDISM 02/01/2008  . OSTEOPOROSIS 02/01/2008  . Type 2 diabetes mellitus with hypoglycemia without coma (HCC) 01/31/2008  . Hyperlipidemia 01/31/2008  . Multiple sclerosis (HCC) 01/31/2008  . Optic neuritis 01/31/2008  . Essential hypertension 01/31/2008  . GERD 01/31/2008    Palliative Care Assessment & Plan   Patient Profile:    Assessment:  75 year old lady from [facility], known to palliative service seen in consultation in previous hospitalizations, admitted from facility with hypoglycemia and encephalopathy.  Reportedly noted to have been lethargic and having diarrhea and low blood sugars at nursing facility.  Admitted to hospital medicine service with neurology, nephrology and critical care specialist following.  Remains near obtunded in the ICU at [facility] in [place], [place], palliative consultation has been requested again for ongoing goals of care discussions.  Recommendations/Plan: Call placed and discussed with Sister [name]. She has discussed with [facility] MD as well as with her bedside RN. She has tried to inform the patient's son. She has established DNR.  We talked about comfort measures: DNR DNI and comfort care. Stop tube feeds, KVO IVF, continue medications important for treating symptoms, discontinue measures no longer contributing to comfort.  Assess whether the patient will be appropriate for transfer to residential hospice in the next 1-2 days, comfort focused care in the hospital from now on. There is a high possibility for hospital death as well. I shared this with sister Georgeanne Nim, she is aware.     Code Status: DNR DNI comfort care.     Code Status Orders  (From admission, onward)           Start     Ordered   03/23/21 0046  Limited resuscitation (code)  Continuous       Question Answer Comment  In the event of cardiac or respiratory ARREST: Initiate Code Blue, Call Rapid Response Yes   In the event of cardiac or respiratory ARREST: Perform CPR Yes   In the event of cardiac or respiratory ARREST: Perform Intubation/Mechanical Ventilation No   In the event of cardiac or respiratory ARREST: Use NIPPV/BiPAp only if indicated Yes   In the event of cardiac or respiratory ARREST: Administer ACLS medications if indicated Yes   In the event of cardiac or respiratory ARREST: Perform Defibrillation or Cardioversion  if indicated Yes      03/23/21 0045  Code Status History     Date Active Date Inactive Code Status Order ID Comments User Context   10/16/2020 1747 10/24/2020 0300 Partial Code [id number]  Jae Dire, MD ED   09/16/2019 1138 09/25/2019 0333 Full Code [id number]  Josephine Igo, DO ED   03/01/2019 0133 03/06/2019 2142 Full Code [id number]  Eduard Clos, MD ED   12/19/2018 1951 12/26/2018 1602 Full Code [id number]  Elgergawy, Leana Roe, MD ED       Prognosis:  Guarded   Discharge Planning: Anticipated Hospital Death Versus residential hospice.  Care plan was discussed with  patient's sister [name] on the phone.   Thank you for allowing the Palliative Medicine Team to assist in the care of this patient.   Time In: 12.30 Time Out: 1305 Total Time 35 Prolonged Time Billed  no       Greater than 50%  of this time was spent counseling and coordinating care related to the above assessment and plan.  Rosalin Hawking, MD  Please contact Palliative Medicine Team phone at [phone] for questions and concerns.

## 2021-03-29 NOTE — Progress Notes (Signed)
Nutrition Brief Note  Patient last assessed by RD on 6/21. Chart reviewed. Palliative Care talked with sister today. Patient now transitioning to comfort care. PPS of 20%.  TF being stopped in line with comfort care. No further nutrition interventions planned at this time. Please re-consult as needed.        Trenton Gammon, MS, RD, LDN, CNSC Inpatient Clinical Dietitian RD pager # available in [facility]  After hours/weekend pager # available in [facility]

## 2021-03-29 NOTE — Progress Notes (Signed)
EEG Completed; Results Pending  

## 2021-03-30 DIAGNOSIS — J189 Pneumonia, unspecified organism: Secondary | ICD-10-CM

## 2021-03-30 LAB — VITAMIN B1: Vitamin B1 (Thiamine): 103.2 nmol/L (ref 66.5–200.0)

## 2021-03-30 MED ORDER — MORPHINE SULFATE (PF) 2 MG/ML IV SOLN
1.0000 mg | INTRAVENOUS | Status: DC | PRN
Start: 2021-03-30 — End: 2021-04-03
  Administered 2021-04-02 (×2): 1 mg via INTRAVENOUS
  Filled 2021-03-30 (×2): qty 1

## 2021-03-30 MED ORDER — LABETALOL HCL 5 MG/ML IV SOLN
10.0000 mg | Freq: Once | INTRAVENOUS | Status: AC
  Administered 2021-03-30: 10 mg via INTRAVENOUS
  Filled 2021-03-30: qty 4

## 2021-03-30 NOTE — Progress Notes (Signed)
PROGRESS NOTE    Janice Little  ZOX:[id number] DOB: 13-Nov-1945 DOA: 04-09-21 PCP: Juluis Rainier, MD   Brief Narrative:  This 75 y.o. female with PMH significant of diabetes, chronic kidney disease stage III, chronic pain syndrome, chronic respiratory failure secondary to COPD, multiple sclerosis, GERD, essential hypertension and primary hyperparathyroidism s/p surgery who presents to the ED with hypoglycemia by EMS.  Patient apparently started having diarrhea  at [facility].  Her blood glucose was noted to be 34 then 70 and 25.  She was given glucagon x 4. She has not received any insulin at SNF.  When patient arrived the ER she appeared drowsy, unable to give any history.  Her initial blood sugar was 46.  She was given D10 and blood sugar went to 130.  She is so lethargic ,not much history was obtained.  Patient was noted to be in acute kidney failure with creatinine more than 4.0  It was previously normal. She was found to have hyperkalemia as well as lactic acidosis which has resolved.  Suspected pyuria and severe sepsis.  Her blood pressure is however stable.  She is hypoxic but has chronically been on 2 L of oxygen.   She is being admitted as a case of severe sepsis with multiple electrolyte abnormalities.  Critical care , Nephrology and Neurology was consulted. Patient continues to remain lethargic, EEG no evidence of seizures, renal function has plateaud,  no further improvement.   Palliative care consulted to discuss goals of care.  Patient has poor prognosis. Family has made patient comfort care.   Assessment & Plan:   Principal Problem:   Severe sepsis (HCC) Active Problems:   Type 2 diabetes mellitus with hypoglycemia without coma (HCC)   Hyperlipidemia   Multiple sclerosis (HCC)   Essential hypertension   GERD   Acute lower UTI   Acute metabolic encephalopathy   AKI (acute kidney injury) (HCC)   Hyperkalemia   Hyponatremia   Hypoglycemia   Pressure ulcer of left  leg   Palliative care by specialist   Goals of care, counseling/discussion  Severe sepsis presumed to be secondary to the UTI: Patient presented with tachycardia, tachypnea, hypotension,  urinary retention, elevated lactic acid. UA: LE - Nitrite -, Many bacteria Started on empiric antibiotics vancomycin and cefepime(MRSA nares+) Blood culture: No growth so far, urine cultures grew lactobacillus. Discontinue IV hydration.  Lactic acid> improved. Discontinue ceftriaxone for UTI. Completed x 5 days. Sepsis resolved.  Hyperkalemia > Resolved. Could be due to acute renal failure. Due to dehydration in the setting of diarrhea Treated with calcium gluconate, Dextrose, Insulin and Lokelma,  potassium has improved. continue to monitor.  Acute Kidney injury : Multifactorial: Patient presented with serum creatinine up to 4.3,  Baseline creatinine normal. This could be multifactorial in the setting of sepsis and dehydration. Nephrology consulted , creatinine is stable at 3.2 Avoid nephrotoxic medications, creatinine trending down 3.28. Patient is not short term or Long-term CRRT candidate. Nephro suggested palliative care to discuss goals of care. Nephrology signed off,  states cannot offer much.  Suspected stercoral colitis: Seen on CT scan.  Continue glycerin suppository   Hypoglycemia : Resolved. Could be due to insulin accumulation in the setting of acute renal failure which is improving. Hold home insulin.  Blood glucose is improving.   Hyponatremia: > Improved. Due to dehydration,  continue to monitor. Continue gentle IV hydration.   History of multiple sclerosis : Stable.  History of CVA:  Continue home regimen when she is  stable.  Acute metabolic encephalopathy could be secondary to acute illness: She continues to remain lethargic, ammonia level normal, ABG unremarkable. Neurology consulted, EEG : No evidence of seizures. Neurology thinks encephalopathy is due to  electrolyte imbalance and uremia. Palliative care consulted to discuss goals of care.   Patient has poor prognosis. Family agreed to make her DNR/DNI Patient is not making any significant improvement regards to mentation. Family has made comfort care.  Continue comfort measures.  Insulin-dependent diabetes:  Hold all insulin and her metformin due to hypoglycemia.   Monitor capillary blood glucose closely.   Essential hypertension: Hydralazine 5mg  IV q6hrs prn  GERD : resume PPI  Chronic pain syndrome: Hold Cymbalta and gabapentin due to AMS  Left leg decubitus ulcer - local wound care  Microcytic hypochromic anemia: Hemoglobin dropped from 7.2- 6.9.  s/p 1 PRBC hemoglobin 9.4> 8.3 > 8.4  There is no visible signs of any obvious bleeding.  Continue to monitor H&H  Goals of care discussion: Palliative care is on board,  Discussions in progress about care. Poor prognosis. Comfort measures only   DVT prophylaxis: Heparin Code Status: DNR Family Communication:  spoke with sister at phone.  Disposition Plan:   Status is: Inpatient  Remains inpatient appropriate because:Inpatient level of care appropriate due to severity of illness  Dispo: The patient is from: SNF              Anticipated d/c is to: SNF / Comfort care.              Patient currently is not medically stable to d/c.   Difficult to place patient No  Consultants:  Nephrology Neurology PCCM  Procedures:  Antimicrobials:   Anti-infectives (From admission, onward)    Start     Dose/Rate Route Frequency Ordered Stop   03/24/21 2200  cefTRIAXone (ROCEPHIN) 2 g in sodium chloride 0.9 % 100 mL IVPB  Status:  Discontinued        2 g 200 mL/hr over 30 Minutes Intravenous Every 24 hours 03/24/21 0936 03/28/21 1109   03/23/21 2200  cefTRIAXone (ROCEPHIN) 1 g in sodium chloride 0.9 % 100 mL IVPB  Status:  Discontinued        1 g 200 mL/hr over 30 Minutes Intravenous Every 24 hours 03/23/21 0022 03/23/21 0916    03/23/21 2200  ceFEPIme (MAXIPIME) 2 g in sodium chloride 0.9 % 100 mL IVPB  Status:  Discontinued        2 g 200 mL/hr over 30 Minutes Intravenous Every 24 hours 03/23/21 0945 03/24/21 0936   03/23/21 0145  ceFEPIme (MAXIPIME) 2 g in sodium chloride 0.9 % 100 mL IVPB  Status:  Discontinued        2 g 200 mL/hr over 30 Minutes Intravenous  Once 03/23/21 0045 03/23/21 0046   03-23-21 2130  ceFEPIme (MAXIPIME) 2 g in sodium chloride 0.9 % 100 mL IVPB        2 g 200 mL/hr over 30 Minutes Intravenous  Once 03-23-21 2121 03/23/21 0046   2021/03/23 2130  metroNIDAZOLE (FLAGYL) IVPB 500 mg        500 mg 100 mL/hr over 60 Minutes Intravenous  Once 03-23-21 2121 03/23/21 0130   March 23, 2021 2130  vancomycin (VANCOCIN) IVPB 1000 mg/200 mL premix        1,000 mg 200 mL/hr over 60 Minutes Intravenous  Once March 23, 2021 2121 03/23/21 0210        Subjective: Patient was seen and examined at bedside.  Overnight events noted. She is chronically ill looking, she continues to remain lethargic, nonverbal at baseline. RN reports she responded while calling her name, She doesn't open eyes on painful stimuli.  Objective: Vitals:   03/30/21 0400 03/30/21 0500 03/30/21 0800 03/30/21 0842  BP: (!) 182/71 (!) 166/66 (!) 191/86 (!) 189/74  Pulse: 82 83 86   Resp: (!) 9 13 11    Temp: (!) 97.52 F (36.4 C) (!) 96.98 F (36.1 C) (!) 97.16 F (36.2 C)   TempSrc: Bladder     SpO2: 96% 95% 98%   Weight:  77.4 kg      Intake/Output Summary (Last 24 hours) at 03/30/2021 1138 Last data filed at 03/30/2021 1100 Gross per 24 hour  Intake --  Output 3925 ml  Net -3925 ml    Filed Weights   03/25/21 0500 03/29/21 0444 03/30/21 0500  Weight: 76.9 kg 77.3 kg 77.4 kg    Examination:  General exam: Remains lethargic,  does not appear in any acute distress. Respiratory system: Mild baseline crackles on auscultation. Respiratory effort normal. Cardiovascular system: S1 & S2 heard, RRR. No JVD, murmurs, rubs, gallops  or clicks. No pedal edema. Gastrointestinal system: Abdomen is nondistended, soft and nontender. No organomegaly or masses felt. Normal bowel sounds heard. Central nervous system: Lethargic , non verbal . No focal neurological deficits. Extremities: No edema, no cyanosis, no clubbing. Skin: No rashes, lesions or ulcers Psychiatry: not assessed.    Data Reviewed: I have personally reviewed following labs and imaging studies  CBC: Recent Labs  Lab 03/24/21 1614 03/25/21 0258 03/26/21 0256 03/27/21 0329 03/28/21 0258  WBC  --  9.8 8.1 9.1  --   HGB 6.9* 9.4* 8.3* 8.4* 8.5*  HCT 22.2* 29.0* 26.0* 27.1* 26.2*  MCV  --  77.7* 80.7 81.9  --   PLT  --  328 278 322  --     Basic Metabolic Panel: Recent Labs  Lab 03/23/21 1156 03/23/21 1526 03/23/21 2020 03/24/21 0320 03/24/21 03/26/21 03/25/21 0258 03/26/21 0256 03/26/21 0831 03/27/21 0329 03/28/21 0258 03/29/21 0256  NA 128*  --    < >  --    < > 130* 134*  --  133* 136 139  K 6.2*  --    < >  --    < > 4.5 5.4* 5.5* 4.9 4.3 3.7  CL 100  --    < >  --    < > 100 103  --  102 105 104  CO2 16*  --    < >  --    < > 20* 21*  --  22 23 25   GLUCOSE 47*  --    < >  --    < > 165* 201*  --  106* 154* 218*  BUN 74*  --    < >  --    < > 78* 90*  --  100* 111* 115*  CREATININE 3.78*  --    < >  --    < > 3.37* 3.28*  --  3.26* 3.38* 3.24*  CALCIUM 8.3*  --    < >  --    < > 8.0* 8.2*  --  8.5* 8.6* 8.8*  MG 1.2* 1.2*  --  1.2*  --  2.2 2.1  --   --   --   --   PHOS 3.7 3.7  --  3.3  --  3.7 3.3  --   --   --   --    < > =  values in this interval not displayed.    GFR: Estimated Creatinine Clearance: 15.2 mL/min (A) (by C-G formula based on SCr of 3.24 mg/dL (H)). Liver Function Tests: No results for input(s): AST, ALT, ALKPHOS, BILITOT, PROT, ALBUMIN in the last 168 hours.  No results for input(s): LIPASE, AMYLASE in the last 168 hours. Recent Labs  Lab 03/26/21 0831  AMMONIA 29    Coagulation Profile: No results for  input(s): INR, PROTIME in the last 168 hours.  Cardiac Enzymes: No results for input(s): CKTOTAL, CKMB, CKMBINDEX, TROPONINI in the last 168 hours. BNP (last 3 results) No results for input(s): PROBNP in the last 8760 hours. HbA1C: No results for input(s): HGBA1C in the last 72 hours. CBG: Recent Labs  Lab 03/28/21 2023 03/28/21 2357 03/29/21 0314 03/29/21 0755 03/29/21 1147  GLUCAP 214* 200* 222* 263* 244*    Lipid Profile: No results for input(s): CHOL, HDL, LDLCALC, TRIG, CHOLHDL, LDLDIRECT in the last 72 hours. Thyroid Function Tests: No results for input(s): TSH, T4TOTAL, FREET4, T3FREE, THYROIDAB in the last 72 hours.  Anemia Panel: No results for input(s): VITAMINB12, FOLATE, FERRITIN, TIBC, IRON, RETICCTPCT in the last 72 hours.  Sepsis Labs: Recent Labs  Lab 03/23/21 1526 03/24/21 0320  LATICACIDVEN 4.6* 2.1*     Recent Results (from the past 240 hour(s))  Culture, blood (Routine X 2) w Reflex to ID Panel     Status: None   Collection Time: 03/12/2021  7:42 PM   Specimen: BLOOD  Result Value Ref Range Status   Specimen Description   Final    BLOOD LEFT ANTECUBITAL Performed at [facility], [address] [place], [place] [address]    Special Requests   Final    BOTTLES DRAWN AEROBIC AND ANAEROBIC Blood Culture adequate volume Performed at [facility], [address] [place], [place] [address]    Culture   Final    NO GROWTH 5 DAYS Performed at [facility], [address] [place], [place] [address]    Report Status 03/27/2021 FINAL  Final  Urine Culture     Status: Abnormal   Collection Time: 03/24/2021  8:30 PM   Specimen: Urine, Random  Result Value Ref Range Status   Specimen Description   Final    URINE, RANDOM Performed at [facility], [address] [place], [place] [address]    Special Requests   Final    NONE Performed at [facility], [address] [place], [place] [address]    Culture (A)  Final    70,000 COLONIES/mL LACTOBACILLUS SPECIES Standardized susceptibility testing for this organism is not available. Performed at [facility], [address] [place], [place] [address]    Report Status 03/24/2021 FINAL  Final  Resp Panel by RT-PCR (Flu A&B, Covid) Nasopharyngeal Swab     Status: None   Collection Time: 03/09/2021  8:54 PM   Specimen: Nasopharyngeal Swab; Nasopharyngeal(NP) swabs in vial transport medium  Result Value Ref Range Status   SARS Coronavirus 2 by RT PCR NEGATIVE NEGATIVE Final    Comment: (NOTE) SARS-CoV-2 target nucleic acids are NOT DETECTED.  The SARS-CoV-2 RNA is generally detectable in upper respiratory specimens during the acute phase of infection. The lowest concentration of SARS-CoV-2 viral copies this assay can detect is 138 copies/mL. A negative result does not preclude SARS-Cov-2 infection and should not be used as the sole basis for treatment or other patient management decisions. A  negative result may occur with  improper specimen collection/handling, submission of specimen other than nasopharyngeal swab, presence of viral mutation(s) within the areas targeted by this assay, and inadequate number of viral copies(<138 copies/mL). A negative result must be combined with clinical observations, patient history, and epidemiological information. The expected result is Negative.  Fact Sheet for Patients:  BloggerCourse.com  Fact Sheet for Healthcare Providers:  SeriousBroker.it  This test is no t yet approved or cleared by the [place] FDA and  has been authorized for detection and/or diagnosis of SARS-CoV-2 by FDA under an Emergency Use Authorization (EUA). This EUA will remain  in effect (meaning this test can be used) for the duration of the COVID-19 declaration under Section 564(b)(1) of the Act, 21 U.S.C.section 360bbb-3(b)(1),  unless the authorization is terminated  or revoked sooner.       Influenza A by PCR NEGATIVE NEGATIVE Final   Influenza B by PCR NEGATIVE NEGATIVE Final    Comment: (NOTE) The Xpert Xpress SARS-CoV-2/FLU/RSV plus assay is intended as an aid in the diagnosis of influenza from Nasopharyngeal swab specimens and should not be used as a sole basis for treatment. Nasal washings and aspirates are unacceptable for Xpert Xpress SARS-CoV-2/FLU/RSV testing.  Fact Sheet for Patients: BloggerCourse.com  Fact Sheet for Healthcare Providers: SeriousBroker.it  This test is not yet approved or cleared by the [place] FDA and has been authorized for detection and/or diagnosis of SARS-CoV-2 by FDA under an Emergency Use Authorization (EUA). This EUA will remain in effect (meaning this test can be used) for the duration of the COVID-19 declaration under Section 564(b)(1) of the Act, 21 U.S.C. section 360bbb-3(b)(1), unless the authorization is terminated or revoked.  Performed at [facility], [address], [place], [place] [address]   Culture, blood (Routine X 2) w Reflex to ID Panel     Status: None   Collection Time: 03/27/2021 10:15 PM   Specimen: BLOOD  Result Value Ref Range Status   Specimen Description BLOOD BLOOD RIGHT FOREARM  Final   Special Requests   Final    BOTTLES DRAWN AEROBIC AND ANAEROBIC Blood Culture results may not be optimal due to an excessive volume of blood received in culture bottles   Culture   Final    NO GROWTH 5 DAYS Performed at [facility], [address] [place], [place] [address]    Report Status 03/28/2021 FINAL  Final  MRSA PCR Screening     Status: Abnormal   Collection Time: 03/23/21 12:15 AM  Result Value Ref Range Status   MRSA by PCR POSITIVE (A) NEGATIVE Final    Comment:        The GeneXpert MRSA Assay (FDA approved for NASAL specimens only), is one component of  a comprehensive MRSA colonization surveillance program. It is not intended to diagnose MRSA infection nor to guide or monitor treatment for MRSA infections. RESULT CALLED TO, READ BACK BY AND VERIFIED WITH: STACY, RN @ 0241 ON 03/23/21 Riesa Pope Performed at [facility], [address] [place], [place] [address]    Radiology Studies: EEG adult  Result Date: Apr 09, 2021 Charlsie Quest, MD     2021-04-09 12:17 PM Patient Name: Janice Little MRN: [id number] Epilepsy Attending: Charlsie Quest Referring Physician/Provider: Dr. Milon Dikes Date: April 09, 2021 Duration: 23.04 minutes Patient history: 75 year old female with history of MS and worsening mental status.  EEG to evaluate for seizures Level of alertness:  lethargic AEDs during EEG study:  None Technical aspects: This EEG study was done with scalp electrodes positioned according to the 10-20 International system of electrode placement. Electrical activity was acquired at a sampling rate of 500Hz  and reviewed with a high frequency filter of 70Hz  and a low frequency filter of 1Hz . EEG data were recorded continuously and digitally stored. Description: No posterior dominant rhythm was seen. EEG showed continuous generalized 3-5Hz  theta-delta slowing.  Intermittent generalized periodic discharges with triphasic morphology at 1 Hz were note. Hyperventilation and photic stimulation were not performed.   ABNORMALITY - Periodic discharges with triphasic morphology, generalized ( GPDs) - Continuous slow, generalized IMPRESSION: This study showed generalized periodic discharges with triphasic morphology which is on the ictal-interictal continuum.  However given the morphology and frequency, it is more likely due to toxic-metabolic causes.  Additionally there is moderate diffuse to severe diffuse encephalopathy, nonspecific etiology. No seizures were seen throughout the recording. Priyanka Annabelle Harman    Scheduled Meds:  chlorhexidine  15  mL Mouth Rinse BID   Chlorhexidine Gluconate Cloth  6 each Topical Daily   furosemide  40 mg Intravenous Q12H   mouth rinse  15 mL Mouth Rinse BID   nystatin   Topical BID   Continuous Infusions:  sodium chloride Stopped (03/29/21 0100)     LOS: 8 days    Time spent: 25 mins    Braxtyn Dorff, MD [facility]   If 7PM-7AM, please contact night-coverage

## 2021-03-31 DIAGNOSIS — G894 Chronic pain syndrome: Secondary | ICD-10-CM

## 2021-03-31 DIAGNOSIS — Z8673 Personal history of transient ischemic attack (TIA), and cerebral infarction without residual deficits: Secondary | ICD-10-CM

## 2021-03-31 DIAGNOSIS — K5289 Other specified noninfective gastroenteritis and colitis: Secondary | ICD-10-CM

## 2021-03-31 DIAGNOSIS — D509 Iron deficiency anemia, unspecified: Secondary | ICD-10-CM

## 2021-03-31 MED ORDER — GLYCOPYRROLATE 0.2 MG/ML IJ SOLN
0.2000 mg | INTRAMUSCULAR | Status: DC | PRN
  Administered 2021-04-02 – 2021-04-08 (×3): 0.2 mg via INTRAVENOUS
  Filled 2021-03-31 (×2): qty 1

## 2021-03-31 MED ORDER — GLYCOPYRROLATE 1 MG PO TABS
1.0000 mg | ORAL_TABLET | ORAL | Status: DC | PRN
  Filled 2021-03-31: qty 1

## 2021-03-31 MED ORDER — HALOPERIDOL LACTATE 5 MG/ML IJ SOLN: 0.5000 mg | INTRAMUSCULAR | Status: DC | PRN

## 2021-03-31 MED ORDER — BIOTENE DRY MOUTH MT LIQD: 15.0000 mL | OROMUCOSAL | Status: DC | PRN

## 2021-03-31 MED ORDER — HALOPERIDOL LACTATE 2 MG/ML PO CONC
0.5000 mg | ORAL | Status: DC | PRN
  Filled 2021-03-31: qty 0.3

## 2021-03-31 MED ORDER — LORAZEPAM 2 MG/ML IJ SOLN: 1.0000 mg | INTRAMUSCULAR | Status: DC | PRN

## 2021-03-31 MED ORDER — GLYCOPYRROLATE 0.2 MG/ML IJ SOLN
0.2000 mg | INTRAMUSCULAR | Status: DC | PRN
  Filled 2021-03-31: qty 1

## 2021-03-31 MED ORDER — POLYVINYL ALCOHOL 1.4 % OP SOLN: 1.0000 [drp] | Freq: Four times a day (QID) | OPHTHALMIC | Status: DC | PRN

## 2021-03-31 MED ORDER — HALOPERIDOL 0.5 MG PO TABS
0.5000 mg | ORAL_TABLET | ORAL | Status: DC | PRN
  Filled 2021-03-31: qty 1

## 2021-03-31 MED ORDER — LORAZEPAM 1 MG PO TABS: 1.0000 mg | ORAL_TABLET | ORAL | Status: DC | PRN

## 2021-03-31 MED ORDER — LORAZEPAM 2 MG/ML PO CONC: 1.0000 mg | ORAL | Status: DC | PRN

## 2021-03-31 NOTE — Progress Notes (Signed)
Daily Progress Note   Patient Name: LUX SKILTON       Date: 03/31/2021 DOB: 04-09-1946  Age: 75 y.o. MRN#: [id number] Attending Physician: Noralee Stain, DO Primary Care Physician: Juluis Rainier, MD Admit Date: 03/03/2021  Reason for Consultation/Follow-up: Establishing goals of care  Subjective: I saw and examined Ms. Zaldivar today.  She is lying in bed in no distress.  She was somnolent and did not arouse to verbal or gentle tactile stimulation.  I called and spoke with patient's sister, [name].  We again reviewed her clinical course of the past couple of days and plan for continued focus on comfort moving forward.  We talked about residential hospice and [name] reports being agreeable to exploring options for residential hospice for end-of-life care.  She asked me to provide her with information regarding local inpatient hospice units.  She is going to review this and the transition of care team will reach out to her later today to answer any questions regarding choice and begin pursuing placement at preferred location.  Length of Stay: 9  Current Medications: Scheduled Meds:     Continuous Infusions: . sodium chloride Stopped (03/29/21 0100)    PRN Meds: acetaminophen **OR** acetaminophen, morphine injection, ondansetron **OR** ondansetron (ZOFRAN) IV  Physical Exam         Somnolent Some generalized edema Appears pale and weak, chronically ill Monitor noted S1-S2 Does not respond, does not verbalize   Vital Signs: BP (!) 183/92 (BP Location: Right Arm)   Pulse (!) 104   Temp 99.14 F (37.3 C) (Bladder)   Resp 16   Wt 77.4 kg   SpO2 100%   BMI 28.40 kg/m  SpO2: SpO2: 100 % O2 Device: O2 Device: Nasal Cannula O2 Flow Rate: O2 Flow Rate (L/min): 2  L/min  Intake/output summary:  Intake/Output Summary (Last 24 hours) at 03/31/2021 1226 Last data filed at 03/31/2021 0336 Gross per 24 hour  Intake --  Output 1300 ml  Net -1300 ml    LBM: Last BM Date: 03/29/21 Baseline Weight: Weight: 71.6 kg Most recent weight: Weight: 77.4 kg      Palliative performance scale 20%. Palliative Assessment/Data:      Patient Active Problem List   Diagnosis Date Noted  . 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 History of CVA (cerebrovascular accident) 03/31/2021  .  Chronic pain syndrome 03/31/2021  . Microcytic anemia 03/31/2021  . Palliative care by specialist   . Goals of care, counseling/discussion   . Pressure ulcer of left leg 03/23/2021  . Severe sepsis (HCC) 30-Mar-2021  . AKI (acute kidney injury) (HCC) 03/30/2021  . Hyperkalemia 30-Mar-2021  . Hyponatremia 30-Mar-2021  . Hypoglycemia 03/30/21  . Acute cholecystitis 10/20/2020  . Acute hypoxemic respiratory failure due to COVID-19 (HCC) 10/16/2020  . Acute metabolic encephalopathy 10/16/2020  . Gross hematuria   . Acute sepsis (HCC)   . Sepsis secondary to UTI (HCC)   . Diarrhea   . Elevated sed rate 08/12/2019  . Elevated C-reactive protein (CRP) 08/12/2019  . Left-sided neglect 08/01/2019  . Myalgia 08/01/2019  . Pressure injury of skin 03/04/2019  . Acute lower UTI   . Stroke (HCC) 03/02/2019  . Acute CVA (cerebrovascular accident) (HCC) 03/01/2019  . Hypertensive urgency 12/19/2018  . History of myelitis 12/26/2017  . History of optic neuritis 04/02/2015  . Diplopia 04/02/2015  . Urinary incontinence 04/02/2015  . Other fatigue 04/02/2015  . 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 Numbness 04/02/2015  . Ataxic gait 04/02/2015  . Essential tremor 04/02/2015  . PRIMARY HYPERPARATHYROIDISM 02/01/2008  . OSTEOPOROSIS 02/01/2008  . Type 2 diabetes mellitus with hypoglycemia without coma (HCC) 01/31/2008  . Hyperlipidemia 01/31/2008  . Multiple  sclerosis (HCC) 01/31/2008  . Optic neuritis 01/31/2008  . Essential hypertension 01/31/2008  . GERD 01/31/2008    Palliative Care Assessment & Plan   Patient Profile:    Assessment:  75 year old lady from [facility] facility, known to palliative service seen in consultation in previous hospitalizations, admitted from facility with hypoglycemia and encephalopathy.  Reportedly noted to have been lethargic and having diarrhea and low blood sugars at nursing facility.  Admitted to hospital medicine service with neurology, nephrology and critical care specialist following.  Remains near obtunded in the ICU at [facility] in [place], [place], palliative consultation has been requested again for ongoing goals of care discussions.  Recommendations/Plan: Patient appears comfortable on my examination today.  Continue comfort care. Spoke with patient's sister, [name], regarding potential for residential hospice.  She requested information on local inpatient hospice units which I provided to her.  Transition of care to follow-up to answer any questions regarding choice and to begin residential hospice placement once her sister has made decision on which location she would like to pursue.   Code Status: DNR DNI comfort care.     Code Status Orders  (From admission, onward)           Start     Ordered   03/23/21 0046  Limited resuscitation (code)  Continuous       Question Answer Comment  In the event of cardiac or respiratory ARREST: Initiate Code Blue, Call Rapid Response Yes   In the event of cardiac or respiratory ARREST: Perform CPR Yes   In the event of cardiac or respiratory ARREST: Perform Intubation/Mechanical Ventilation No   In the event of cardiac or respiratory ARREST: Use NIPPV/BiPAp only if indicated Yes   In the event of cardiac or respiratory ARREST: Administer ACLS medications if indicated Yes   In the event of cardiac or respiratory ARREST: Perform  Defibrillation or Cardioversion if indicated Yes      03/23/21 0045           Code Status History     Date Active Date Inactive Code Status Order ID Comments User Context  10/16/2020 1747 10/24/2020 0300 Partial Code [id number]  Jae Dire, MD ED   09/16/2019 1138 09/25/2019 0333 Full Code [id number]  Josephine Igo, DO ED   03/01/2019 0133 03/06/2019 2142 Full Code [id number]  Eduard Clos, MD ED   12/19/2018 1951 12/26/2018 1602 Full Code [id number]  Elgergawy, Leana Roe, MD ED       Prognosis: Days to less than 2 weeks Discharge Planning: Hospice facility Versus residential hospice.  Care plan was discussed with  patient's sister [name] on the phone.   Thank you for allowing the Palliative Medicine Team to assist in the care of this patient.   Time In: 1100 Time Out: 1130 Total Time 30 Prolonged Time Billed  no    Greater than 50%  of this time was spent counseling and coordinating care related to the above assessment and plan.  [name], MD  Please contact Palliative Medicine Team phone at [phone] for questions and concerns.

## 2021-03-31 NOTE — Progress Notes (Signed)
Daily Progress Note   Patient Name: Janice Little       Date: 03/31/2021 DOB: Aug 10, 1946  Age: 75 y.o. MRN#: [id number] Attending Physician: Noralee Stain, DO Primary Care Physician: Juluis Rainier, MD Admit Date: 03/10/2021  Reason for Consultation/Follow-up: Establishing goals of care  Subjective: I saw and examined Janice Little today.  She is lying in bed in no distress.  She was lethargic and did not arouse to gentle verbal or tactile stimulation.  I called and spoke with patient's sister.  We discussed her clinical course of the last couple of days and continued focus on comfort moving forward.  Discussed plan to reassess tomorrow with consideration for working to transition to residential hospice if she appears stable enough for transport.  Her sister is requesting information on anything she would need to do if plan were to be to transition to hospice.  She specifically is asking about the fact that patient's living facility have been receiving her Social Security check and she is not sure if she needs to do anything to transition this to hospice if she does transfer to residential hospice.  Length of Stay: 9  Current Medications: Scheduled Meds:  . furosemide  40 mg Intravenous Q12H  . nystatin   Topical BID    Continuous Infusions: . sodium chloride Stopped (03/29/21 0100)    PRN Meds: acetaminophen **OR** acetaminophen, morphine injection, ondansetron **OR** ondansetron (ZOFRAN) IV  Physical Exam         Somnolent Some generalized edema Appears pale and weak, chronically ill Monitor noted S1-S2 Does not respond, does not verbalize   Vital Signs: BP (!) 183/92   Pulse (!) 104   Temp 99.14 F (37.3 C)   Resp 16   Wt 77.4 kg   SpO2 100%   BMI 28.40 kg/m   SpO2: SpO2: 100 % O2 Device: O2 Device: Nasal Cannula O2 Flow Rate: O2 Flow Rate (L/min): 2 L/min  Intake/output summary:  Intake/Output Summary (Last 24 hours) at 03/31/2021 0755 Last data filed at 03/31/2021 0336 Gross per 24 hour  Intake --  Output 2800 ml  Net -2800 ml    LBM: Last BM Date: 03/29/21 Baseline Weight: Weight: 71.6 kg Most recent weight: Weight: 77.4 kg      Palliative performance scale 20%. Palliative Assessment/Data:  Patient Active Problem List   Diagnosis Date Noted  . Palliative care by specialist   . Goals of care, counseling/discussion   . Pressure ulcer of left leg 03/23/2021  . Severe sepsis (HCC) 03/03/2021  . AKI (acute kidney injury) (HCC) 03/21/2021  . Hyperkalemia 03/07/2021  . Hyponatremia 03/11/2021  . Hypoglycemia 03/26/2021  . Acute cholecystitis 10/20/2020  . Acute hypoxemic respiratory failure due to COVID-19 (HCC) 10/16/2020  . Acute metabolic encephalopathy 10/16/2020  . Gross hematuria   . Acute sepsis (HCC)   . Sepsis secondary to UTI (HCC)   . Diarrhea   . Elevated sed rate 08/12/2019  . Elevated C-reactive protein (CRP) 08/12/2019  . Left-sided neglect 08/01/2019  . Myalgia 08/01/2019  . Pressure injury of skin 03/04/2019  . Acute lower UTI   . Stroke (HCC) 03/02/2019  . Acute CVA (cerebrovascular accident) (HCC) 03/01/2019  . Hypertensive urgency 12/19/2018  . History of myelitis 12/26/2017  . History of optic neuritis 04/02/2015  . Diplopia 04/02/2015  . Urinary incontinence 04/02/2015  . Other fatigue 04/02/2015  . 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 Numbness 04/02/2015  . Ataxic gait 04/02/2015  . Essential tremor 04/02/2015  . PRIMARY HYPERPARATHYROIDISM 02/01/2008  . OSTEOPOROSIS 02/01/2008  . Type 2 diabetes mellitus with hypoglycemia without coma (HCC) 01/31/2008  . Hyperlipidemia 01/31/2008  . Multiple sclerosis (HCC) 01/31/2008  . Optic neuritis 01/31/2008  .  Essential hypertension 01/31/2008  . GERD 01/31/2008    Palliative Care Assessment & Plan   Patient Profile:    Assessment:  76 year old lady from [facility] facility, known to palliative service seen in consultation in previous hospitalizations, admitted from facility with hypoglycemia and encephalopathy.  Reportedly noted to have been lethargic and having diarrhea and low blood sugars at nursing facility.  Admitted to hospital medicine service with neurology, nephrology and critical care specialist following.  Remains near obtunded in the ICU at [facility] in [place], [place], palliative consultation has been requested again for ongoing goals of care discussions.  Recommendations/Plan: Patient appears comfortable on my examination today.  Continue comfort care. Spoke with patient's sister, Janice Little, via phone.  Discussed potential transfer to residential hospice if she appears stable enough to transport in the next day or 2. Palliative care to follow-up tomorrow.  Likely begin process to transition to residential hospice at that time.    Code Status: DNR DNI comfort care.     Code Status Orders  (From admission, onward)           Start     Ordered   03/23/21 0046  Limited resuscitation (code)  Continuous       Question Answer Comment  In the event of cardiac or respiratory ARREST: Initiate Code Blue, Call Rapid Response Yes   In the event of cardiac or respiratory ARREST: Perform CPR Yes   In the event of cardiac or respiratory ARREST: Perform Intubation/Mechanical Ventilation No   In the event of cardiac or respiratory ARREST: Use NIPPV/BiPAp only if indicated Yes   In the event of cardiac or respiratory ARREST: Administer ACLS medications if indicated Yes   In the event of cardiac or respiratory ARREST: Perform Defibrillation or Cardioversion if indicated Yes      03/23/21 0045           Code Status History     Date Active Date Inactive Code  Status Order ID Comments User Context   10/16/2020 1747 10/24/2020 0300 Partial Code [id number]  Dairl Ponder,  Roe Rutherford, MD ED   09/16/2019 1138 09/25/2019 0333 Full Code [id number]  Josephine Igo, DO ED   03/01/2019 0133 03/06/2019 2142 Full Code [id number]  Eduard Clos, MD ED   12/19/2018 1951 12/26/2018 1602 Full Code [id number]  Elgergawy, Leana Roe, MD ED       Prognosis:  Guarded   Discharge Planning: Anticipated Hospital Death Versus residential hospice.  Care plan was discussed with  patient's sister Janice Little on the phone.   Thank you for allowing the Palliative Medicine Team to assist in the care of this patient.   Time In: 1200 Time Out: 1240 Total Time 40 Prolonged Time Billed  no    Greater than 50%  of this time was spent counseling and coordinating care related to the above assessment and plan.   [name], MD  Please contact Palliative Medicine Team phone at [phone] for questions and concerns.

## 2021-03-31 NOTE — TOC Progression Note (Signed)
Transition of Care [facility]) - Progression Note    Patient Details  Name: Janice Little MRN: [id number] Date of Birth: [birth year]  Transition of Care [facility]) CM/SW Contact  Golda Acre, RN Phone Number: 03/31/2021, 9:15 AM  Clinical Narrative:    Assessment:  76 year old lady from [facility] facility, known to palliative service seen in consultation in previous hospitalizations, admitted from facility with hypoglycemia and encephalopathy.  Reportedly noted to have been lethargic and having diarrhea and low blood sugars at nursing facility.  Admitted to hospital medicine service with neurology, nephrology and critical care specialist following.  Remains near obtunded in the ICU at [facility] in [place], [place], palliative consultation has been requested again for ongoing goals of care discussions.   Recommendations/Plan: Patient appears comfortable on my examination today.  Continue comfort care. Spoke with patient's sister, [name], via phone.  Discussed potential transfer to residential hospice if she appears stable enough to transport in the next day or 2. Palliative care to follow-up tomorrow.  Likely begin process to transition to residential hospice at that time.  TOC PLAN OF CARE: Possible transfer at dc to residential hospice will follow for this.   Expected Discharge Plan: Skilled Nursing Facility Barriers to Discharge: Continued Medical Work up  Expected Discharge Plan and Services Expected Discharge Plan: Skilled Nursing Facility       Living arrangements for the past 2 months: Skilled Nursing Facility                                       Social Determinants of Health (SDOH) Interventions    Readmission Risk Interventions Readmission Risk Prevention Plan 09/18/2019 12/24/2018  Post Dischage Appt - Complete  Medication Screening - Complete  Transportation Screening Complete Complete  PCP or Specialist Appt within 5-7 Days  Complete -  Home Care Screening Complete -  Medication Review (RN CM) Complete -  Some recent data might be hidden

## 2021-03-31 NOTE — Progress Notes (Signed)
PROGRESS NOTE    Janice NimsCarol T Little  NWG:[id number] DOB: 02/29/1946 DOA: 03/08/2021 PCP: Juluis RainierBarnes, Elizabeth, MD     Brief Narrative:  This 75 y.o. female with PMH significant of diabetes, chronic kidney disease stage III, chronic pain syndrome, chronic respiratory failure secondary to COPD, multiple sclerosis, GERD, essential hypertension and primary hyperparathyroidism s/p surgery who presents to the ED with hypoglycemia by EMS.  Patient apparently started having diarrhea  at [facility].  Her blood glucose was noted to be 34 then 70 and 25.  She was given glucagon x 4. She has not received any insulin at SNF.  When patient arrived the ER she appeared drowsy, unable to give any history.  Her initial blood sugar was 46.  She was given D10 and blood sugar went to 130.  She is so lethargic ,not much history was obtained.  Patient was noted to be in acute kidney failure with creatinine more than 4.0  It was previously normal. She was found to have hyperkalemia as well as lactic acidosis which has resolved.  Suspected pyuria and severe sepsis.  Her blood pressure is however stable.  She is hypoxic but has chronically been on 2 L of oxygen.    She was admitted as a case of severe sepsis with multiple electrolyte abnormalities.  Critical care , Nephrology and Neurology was consulted. Patient continues to remain lethargic, EEG no evidence of seizures, renal function has plateaud,  no further improvement.   Palliative care consulted to discuss goals of care.  Patient has poor prognosis. Family has made patient comfort care.  New events last 24 hours / Subjective: No new events overnight.  Patient is slightly arousable to verbal stimuli.  Appears to be comfortably resting.  Assessment & Plan:   Principal Problem:   Severe sepsis (HCC) Active Problems:   Type 2 diabetes mellitus with hypoglycemia without coma (HCC)   Hyperlipidemia   Multiple sclerosis (HCC)   Essential hypertension   GERD   Acute  lower UTI   Acute metabolic encephalopathy   AKI (acute kidney injury) (HCC)   Hyperkalemia   Hyponatremia   Hypoglycemia   Pressure ulcer of left leg   Palliative care by specialist   Goals of care, counseling/discussion   Stercoral colitis   History of CVA (cerebrovascular accident)   Chronic pain syndrome   Microcytic anemia    Code Status: DNR Family Communication: None at bedside Disposition Plan:  Status is: Inpatient  Remains inpatient appropriate because: Comfort care  Dispo: The patient is from: SNF              Anticipated d/c is to:  Residential hospice              Patient currently is not medically stable to d/c.   Difficult to place patient No     Antimicrobials:  Anti-infectives (From admission, onward)    Start     Dose/Rate Route Frequency Ordered Stop   03/24/21 2200  cefTRIAXone (ROCEPHIN) 2 g in sodium chloride 0.9 % 100 mL IVPB  Status:  Discontinued        2 g 200 mL/hr over 30 Minutes Intravenous Every 24 hours 03/24/21 0936 03/28/21 1109   03/23/21 2200  cefTRIAXone (ROCEPHIN) 1 g in sodium chloride 0.9 % 100 mL IVPB  Status:  Discontinued        1 g 200 mL/hr over 30 Minutes Intravenous Every 24 hours 03/23/21 0022 03/23/21 0916   03/23/21 2200  ceFEPIme (MAXIPIME) 2 g  in sodium chloride 0.9 % 100 mL IVPB  Status:  Discontinued        2 g 200 mL/hr over 30 Minutes Intravenous Every 24 hours 03/23/21 0945 03/24/21 0936   03/23/21 0145  ceFEPIme (MAXIPIME) 2 g in sodium chloride 0.9 % 100 mL IVPB  Status:  Discontinued        2 g 200 mL/hr over 30 Minutes Intravenous  Once 03/23/21 0045 03/23/21 0046   03/18/2021 2130  ceFEPIme (MAXIPIME) 2 g in sodium chloride 0.9 % 100 mL IVPB        2 g 200 mL/hr over 30 Minutes Intravenous  Once 03/09/2021 2121 03/23/21 0046   03/07/2021 2130  metroNIDAZOLE (FLAGYL) IVPB 500 mg        500 mg 100 mL/hr over 60 Minutes Intravenous  Once 03/09/2021 2121 03/23/21 0130   03/19/2021 2130  vancomycin (VANCOCIN) IVPB 1000  mg/200 mL premix        1,000 mg 200 mL/hr over 60 Minutes Intravenous  Once 03/20/2021 2121 03/23/21 0210        Objective: Vitals:   03/31/21 0338 03/31/21 0400 03/31/21 0734 03/31/21 0740  BP:   (!) 183/92   Pulse: (!) 103 (!) 103 (!) 104   Resp: 14 14 16    Temp: 99.32 F (37.4 C) 99.14 F (37.3 C) 99.14 F (37.3 C)   TempSrc:   Bladder   SpO2: (!) 86% (!) 88% 100% 100%  Weight:        Intake/Output Summary (Last 24 hours) at 03/31/2021 1005 Last data filed at 03/31/2021 0336 Gross per 24 hour  Intake --  Output 2800 ml  Net -2800 ml   Filed Weights   03/25/21 0500 03/29/21 0444 03/30/21 0500  Weight: 76.9 kg 77.3 kg 77.4 kg    Examination:  General exam: Appears calm and comfortable  Respiratory system: Clear to auscultation. Respiratory effort normal.  Cardiovascular system: S1 & S2 heard, Tachycardic, +edema  Gastrointestinal system: Abdomen is nondistended, soft  Central nervous system: Arousable to voice   Data Reviewed: I have personally reviewed following labs and imaging studies  CBC: Recent Labs  Lab 03/24/21 1614 03/25/21 0258 03/26/21 0256 03/27/21 0329 03/28/21 0258  WBC  --  9.8 8.1 9.1  --   HGB 6.9* 9.4* 8.3* 8.4* 8.5*  HCT 22.2* 29.0* 26.0* 27.1* 26.2*  MCV  --  77.7* 80.7 81.9  --   PLT  --  328 278 322  --    Basic Metabolic Panel: Recent Labs  Lab 03/25/21 0258 03/26/21 0256 03/26/21 0831 03/27/21 0329 03/28/21 0258 03/29/21 0256  NA 130* 134*  --  133* 136 139  K 4.5 5.4* 5.5* 4.9 4.3 3.7  CL 100 103  --  102 105 104  CO2 20* 21*  --  22 23 25   GLUCOSE 165* 201*  --  106* 154* 218*  BUN 78* 90*  --  100* 111* 115*  CREATININE 3.37* 3.28*  --  3.26* 3.38* 3.24*  CALCIUM 8.0* 8.2*  --  8.5* 8.6* 8.8*  MG 2.2 2.1  --   --   --   --   PHOS 3.7 3.3  --   --   --   --    GFR: Estimated Creatinine Clearance: 15.2 mL/min (A) (by C-G formula based on SCr of 3.24 mg/dL (H)). Liver Function Tests: No results for input(s): AST,  ALT, ALKPHOS, BILITOT, PROT, ALBUMIN in the last 168 hours. No results for input(s): LIPASE,  AMYLASE in the last 168 hours. Recent Labs  Lab 03/26/21 0831  AMMONIA 29   Coagulation Profile: No results for input(s): INR, PROTIME in the last 168 hours. Cardiac Enzymes: No results for input(s): CKTOTAL, CKMB, CKMBINDEX, TROPONINI in the last 168 hours. BNP (last 3 results) No results for input(s): PROBNP in the last 8760 hours. HbA1C: No results for input(s): HGBA1C in the last 72 hours. CBG: Recent Labs  Lab 03/28/21 2023 03/28/21 2357 03/29/21 0314 03/29/21 0755 03/29/21 1147  GLUCAP 214* 200* 222* 263* 244*   Lipid Profile: No results for input(s): CHOL, HDL, LDLCALC, TRIG, CHOLHDL, LDLDIRECT in the last 72 hours. Thyroid Function Tests: No results for input(s): TSH, T4TOTAL, FREET4, T3FREE, THYROIDAB in the last 72 hours. Anemia Panel: No results for input(s): VITAMINB12, FOLATE, FERRITIN, TIBC, IRON, RETICCTPCT in the last 72 hours. Sepsis Labs: No results for input(s): PROCALCITON, LATICACIDVEN in the last 168 hours.  Recent Results (from the past 240 hour(s))  Culture, blood (Routine X 2) w Reflex to ID Panel     Status: None   Collection Time: 04/05/21  7:42 PM   Specimen: BLOOD  Result Value Ref Range Status   Specimen Description   Final    BLOOD LEFT ANTECUBITAL Performed at [facility], [address], [place], [place] [address]    Special Requests   Final    BOTTLES DRAWN AEROBIC AND ANAEROBIC Blood Culture adequate volume Performed at [facility], [address], [place], [place] [address]    Culture   Final    NO GROWTH 5 DAYS Performed at [facility], [address] [place], [place] [address]    Report Status 03/27/2021 FINAL  Final  Urine Culture     Status: Abnormal   Collection Time: 04-05-21  8:30 PM   Specimen: Urine, Random  Result Value Ref Range Status   Specimen Description   Final    URINE,  RANDOM Performed at [facility], [address], [place], [place] [address]    Special Requests   Final    NONE Performed at [facility], [address] [place], [place] [address]    Culture (A)  Final    70,000 COLONIES/mL LACTOBACILLUS SPECIES Standardized susceptibility testing for this organism is not available. Performed at [facility], [address], [place], [place] [address]    Report Status 03/24/2021 FINAL  Final  Resp Panel by RT-PCR (Flu A&B, Covid) Nasopharyngeal Swab     Status: None   Collection Time: 04-05-2021  8:54 PM   Specimen: Nasopharyngeal Swab; Nasopharyngeal(NP) swabs in vial transport medium  Result Value Ref Range Status   SARS Coronavirus 2 by RT PCR NEGATIVE NEGATIVE Final    Comment: (NOTE) SARS-CoV-2 target nucleic acids are NOT DETECTED.  The SARS-CoV-2 RNA is generally detectable in upper respiratory specimens during the acute phase of infection. The lowest concentration of SARS-CoV-2 viral copies this assay can detect is 138 copies/mL. A negative result does not preclude SARS-Cov-2 infection and should not be used as the sole basis for treatment or other patient management decisions. A negative result may occur with  improper specimen collection/handling, submission of specimen other than nasopharyngeal swab, presence of viral mutation(s) within the areas targeted by this assay, and inadequate number of viral copies(<138 copies/mL). A negative result must be combined with clinical observations, patient history, and epidemiological information. The expected result is Negative.  Fact Sheet for Patients:  BloggerCourse.com  Fact Sheet for Healthcare Providers:  SeriousBroker.it  This test is no t yet approved or cleared by the [place] FDA and  has been authorized for detection and/or diagnosis of SARS-CoV-2 by FDA under an Emergency Use  Authorization (EUA). This EUA will remain  in effect (meaning this test can be used) for the duration of the COVID-19 declaration under Section 564(b)(1) of the Act, 21 U.S.C.section 360bbb-3(b)(1), unless the authorization is terminated  or revoked sooner.       Influenza A by PCR NEGATIVE NEGATIVE Final   Influenza B by PCR NEGATIVE NEGATIVE Final    Comment: (NOTE) The Xpert Xpress SARS-CoV-2/FLU/RSV plus assay is intended as an aid in the diagnosis of influenza from Nasopharyngeal swab specimens and should not be used as a sole basis for treatment. Nasal washings and aspirates are unacceptable for Xpert Xpress SARS-CoV-2/FLU/RSV testing.  Fact Sheet for Patients: BloggerCourse.com  Fact Sheet for Healthcare Providers: SeriousBroker.it  This test is not yet approved or cleared by the [place] FDA and has been authorized for detection and/or diagnosis of SARS-CoV-2 by FDA under an Emergency Use Authorization (EUA). This EUA will remain in effect (meaning this test can be used) for the duration of the COVID-19 declaration under Section 564(b)(1) of the Act, 21 U.S.C. section 360bbb-3(b)(1), unless the authorization is terminated or revoked.  Performed at [facility], [address] [place], [place] [address]   Culture, blood (Routine X 2) w Reflex to ID Panel     Status: None   Collection Time: 2021/04/06 10:15 PM   Specimen: BLOOD  Result Value Ref Range Status   Specimen Description BLOOD BLOOD RIGHT FOREARM  Final   Special Requests   Final    BOTTLES DRAWN AEROBIC AND ANAEROBIC Blood Culture results may not be optimal due to an excessive volume of blood received in culture bottles   Culture   Final    NO GROWTH 5 DAYS Performed at [facility], [address] [place], [place] [address]    Report Status 03/28/2021 FINAL  Final  MRSA PCR Screening     Status: Abnormal   Collection Time:  03/23/21 12:15 AM  Result Value Ref Range Status   MRSA by PCR POSITIVE (A) NEGATIVE Final    Comment:        The GeneXpert MRSA Assay (FDA approved for NASAL specimens only), is one component of a comprehensive MRSA colonization surveillance program. It is not intended to diagnose MRSA infection nor to guide or monitor treatment for MRSA infections. RESULT CALLED TO, READ BACK BY AND VERIFIED WITH: STACY, RN @ 0241 ON 03/23/21 Riesa Pope Performed at [facility], [address] [place], [place] [address]       Radiology Studies: EEG adult  Result Date: 2021-04-13 Charlsie Quest, MD     13-Apr-2021 12:17 PM Patient Name: Janice Little MRN: [id number] Epilepsy Attending: Charlsie Quest Referring Physician/Provider: Dr. Milon Dikes Date: April 13, 2021 Duration: 23.04 minutes Patient history: 75 year old female with history of MS and worsening mental status.  EEG to evaluate for seizures Level of alertness:  lethargic AEDs during EEG study: None Technical aspects: This EEG study was done with scalp electrodes positioned according to the 10-20 International system of electrode placement. Electrical activity was acquired at a sampling rate of 500Hz  and reviewed with a high frequency filter of 70Hz  and a low frequency filter of 1Hz . EEG data were recorded continuously and digitally stored. Description: No posterior  dominant rhythm was seen. EEG showed continuous generalized 3-5Hz  theta-delta slowing.  Intermittent generalized periodic discharges with triphasic morphology at 1 Hz were note. Hyperventilation and photic stimulation were not performed.   ABNORMALITY - Periodic discharges with triphasic morphology, generalized ( GPDs) - Continuous slow, generalized IMPRESSION: This study showed generalized periodic discharges with triphasic morphology which is on the ictal-interictal continuum.  However given the morphology and frequency, it is more likely due to toxic-metabolic  causes.  Additionally there is moderate diffuse to severe diffuse encephalopathy, nonspecific etiology. No seizures were seen throughout the recording. Priyanka Annabelle Harman      Scheduled Meds: Continuous Infusions:  sodium chloride Stopped (03/29/21 0100)     LOS: 9 days      Time spent: 20 minutes   Noralee Stain, DO [facility] 03/31/2021, 10:05 AM   Available via Epic secure chat 7am-7pm After these hours, please refer to coverage provider listed on [email or web address]

## 2021-04-01 DIAGNOSIS — N390 Urinary tract infection, site not specified: Secondary | ICD-10-CM

## 2021-04-01 NOTE — Progress Notes (Signed)
PROGRESS NOTE    Janice Little  HKV:[id number] DOB: [birth year] DOA: 03/20/2021 PCP: Juluis Rainier, MD     Brief Narrative:  This 75 y.o. female with PMH significant of diabetes, chronic kidney disease stage III, chronic pain syndrome, chronic respiratory failure secondary to COPD, multiple sclerosis, GERD, essential hypertension and primary hyperparathyroidism s/p surgery who presents to the ED with hypoglycemia by EMS.  Patient apparently started having diarrhea  at [facility].  Her blood glucose was noted to be 34 then 70 and 25.  She was given glucagon x 4. She has not received any insulin at SNF.  When patient arrived the ER she appeared drowsy, unable to give any history.  Her initial blood sugar was 46.  She was given D10 and blood sugar went to 130.  She is so lethargic ,not much history was obtained.  Patient was noted to be in acute kidney failure with creatinine more than 4.0  It was previously normal. She was found to have hyperkalemia as well as lactic acidosis which has resolved.  Suspected pyuria and severe sepsis.  Her blood pressure is however stable.  She is hypoxic but has chronically been on 2 L of oxygen.    She was admitted as a case of severe sepsis with multiple electrolyte abnormalities.  Critical care , Nephrology and Neurology was consulted. Patient continues to remain lethargic, EEG no evidence of seizures, renal function has plateaud,  no further improvement.   Palliative care consulted to discuss goals of care.  Patient has poor prognosis. Family has made patient comfort care.  New events last 24 hours / Subjective: Patient resting comfortably, eyes closed  Assessment & Plan:   Principal Problem:   Severe sepsis (HCC) Active Problems:   Type 2 diabetes mellitus with hypoglycemia without coma (HCC)   Hyperlipidemia   Multiple sclerosis (HCC)   Essential hypertension   GERD   Acute lower UTI   Acute metabolic encephalopathy   AKI (acute kidney injury)  (HCC)   Hyperkalemia   Hyponatremia   Hypoglycemia   Pressure ulcer of left leg   Palliative care by specialist   Goals of care, counseling/discussion   Stercoral colitis   History of CVA (cerebrovascular accident)   Chronic pain syndrome   Microcytic anemia    Code Status: DNR Family Communication: None at bedside Disposition Plan:  Status is: Inpatient  Remains inpatient appropriate because: Comfort care  Dispo: The patient is from: SNF              Anticipated d/c is to:  Residential hospice versus in-hospital death              Patient currently is not medically stable to d/c.   Difficult to place patient No     Antimicrobials:  Anti-infectives (From admission, onward)    Start     Dose/Rate Route Frequency Ordered Stop   03/24/21 2200  cefTRIAXone (ROCEPHIN) 2 g in sodium chloride 0.9 % 100 mL IVPB  Status:  Discontinued        2 g 200 mL/hr over 30 Minutes Intravenous Every 24 hours 03/24/21 0936 03/28/21 1109   03/23/21 2200  cefTRIAXone (ROCEPHIN) 1 g in sodium chloride 0.9 % 100 mL IVPB  Status:  Discontinued        1 g 200 mL/hr over 30 Minutes Intravenous Every 24 hours 03/23/21 0022 03/23/21 0916   03/23/21 2200  ceFEPIme (MAXIPIME) 2 g in sodium chloride 0.9 % 100 mL IVPB  Status:  Discontinued        2 g 200 mL/hr over 30 Minutes Intravenous Every 24 hours 03/23/21 0945 03/24/21 0936   03/23/21 0145  ceFEPIme (MAXIPIME) 2 g in sodium chloride 0.9 % 100 mL IVPB  Status:  Discontinued        2 g 200 mL/hr over 30 Minutes Intravenous  Once 03/23/21 0045 03/23/21 0046   04/01/2021 2130  ceFEPIme (MAXIPIME) 2 g in sodium chloride 0.9 % 100 mL IVPB        2 g 200 mL/hr over 30 Minutes Intravenous  Once 03/16/2021 2121 03/23/21 0046   03/19/2021 2130  metroNIDAZOLE (FLAGYL) IVPB 500 mg        500 mg 100 mL/hr over 60 Minutes Intravenous  Once 03/16/2021 2121 03/23/21 0130   03/25/2021 2130  vancomycin (VANCOCIN) IVPB 1000 mg/200 mL premix        1,000 mg 200 mL/hr  over 60 Minutes Intravenous  Once 03/12/2021 2121 03/23/21 0210        Objective: Vitals:   03/31/21 0734 03/31/21 0740 03/31/21 1538 03/31/21 2110  BP: (!) 183/92  (!) 167/90 (!) 202/99  Pulse: (!) 104  (!) 116 (!) 115  Resp: 16  14 18   Temp: 99.14 F (37.3 C)  98.9 F (37.2 C) 99 F (37.2 C)  TempSrc: Bladder  Axillary Oral  SpO2: 100% 100% 90% 100%  Weight:       No intake or output data in the 24 hours ending 04/01/21 1157  Filed Weights   03/25/21 0500 03/29/21 0444 03/30/21 0500  Weight: 76.9 kg 77.3 kg 77.4 kg    Examination: General exam: Appears calm   Data Reviewed: I have personally reviewed following labs and imaging studies  CBC: Recent Labs  Lab 03/26/21 0256 03/27/21 0329 03/28/21 0258  WBC 8.1 9.1  --   HGB 8.3* 8.4* 8.5*  HCT 26.0* 27.1* 26.2*  MCV 80.7 81.9  --   PLT 278 322  --     Basic Metabolic Panel: Recent Labs  Lab 03/26/21 0256 03/26/21 0831 03/27/21 0329 03/28/21 0258 03/29/21 0256  NA 134*  --  133* 136 139  K 5.4* 5.5* 4.9 4.3 3.7  CL 103  --  102 105 104  CO2 21*  --  22 23 25   GLUCOSE 201*  --  106* 154* 218*  BUN 90*  --  100* 111* 115*  CREATININE 3.28*  --  3.26* 3.38* 3.24*  CALCIUM 8.2*  --  8.5* 8.6* 8.8*  MG 2.1  --   --   --   --   PHOS 3.3  --   --   --   --     GFR: Estimated Creatinine Clearance: 15.2 mL/min (A) (by C-G formula based on SCr of 3.24 mg/dL (H)). Liver Function Tests: No results for input(s): AST, ALT, ALKPHOS, BILITOT, PROT, ALBUMIN in the last 168 hours. No results for input(s): LIPASE, AMYLASE in the last 168 hours. Recent Labs  Lab 03/26/21 0831  AMMONIA 29    Coagulation Profile: No results for input(s): INR, PROTIME in the last 168 hours. Cardiac Enzymes: No results for input(s): CKTOTAL, CKMB, CKMBINDEX, TROPONINI in the last 168 hours. BNP (last 3 results) No results for input(s): PROBNP in the last 8760 hours. HbA1C: No results for input(s): HGBA1C in the last 72  hours. CBG: Recent Labs  Lab 03/28/21 2023 03/28/21 2357 03/29/21 0314 03/29/21 0755 03/29/21 1147  GLUCAP 214* 200* 222* 263* 244*  Lipid Profile: No results for input(s): CHOL, HDL, LDLCALC, TRIG, CHOLHDL, LDLDIRECT in the last 72 hours. Thyroid Function Tests: No results for input(s): TSH, T4TOTAL, FREET4, T3FREE, THYROIDAB in the last 72 hours. Anemia Panel: No results for input(s): VITAMINB12, FOLATE, FERRITIN, TIBC, IRON, RETICCTPCT in the last 72 hours. Sepsis Labs: No results for input(s): PROCALCITON, LATICACIDVEN in the last 168 hours.  Recent Results (from the past 240 hour(s))  Culture, blood (Routine X 2) w Reflex to ID Panel     Status: None   Collection Time: 03/20/2021  7:42 PM   Specimen: BLOOD  Result Value Ref Range Status   Specimen Description   Final    BLOOD LEFT ANTECUBITAL Performed at [facility], [address] [place], [place] [address]    Special Requests   Final    BOTTLES DRAWN AEROBIC AND ANAEROBIC Blood Culture adequate volume Performed at [facility], [address] [place], [place] [address]    Culture   Final    NO GROWTH 5 DAYS Performed at [facility], [address], [place], [place] [address]    Report Status 03/27/2021 FINAL  Final  Urine Culture     Status: Abnormal   Collection Time: 03/31/2021  8:30 PM   Specimen: Urine, Random  Result Value Ref Range Status   Specimen Description   Final    URINE, RANDOM Performed at [facility], [address] [place], [place] [address]    Special Requests   Final    NONE Performed at [facility], [address] [place], [place] [address]    Culture (A)  Final    70,000 COLONIES/mL LACTOBACILLUS SPECIES Standardized susceptibility testing for this organism is not available. Performed at [facility], [address] [place], [place] [address]    Report Status 03/24/2021 FINAL  Final  Resp  Panel by RT-PCR (Flu A&B, Covid) Nasopharyngeal Swab     Status: None   Collection Time: 03/05/2021  8:54 PM   Specimen: Nasopharyngeal Swab; Nasopharyngeal(NP) swabs in vial transport medium  Result Value Ref Range Status   SARS Coronavirus 2 by RT PCR NEGATIVE NEGATIVE Final    Comment: (NOTE) SARS-CoV-2 target nucleic acids are NOT DETECTED.  The SARS-CoV-2 RNA is generally detectable in upper respiratory specimens during the acute phase of infection. The lowest concentration of SARS-CoV-2 viral copies this assay can detect is 138 copies/mL. A negative result does not preclude SARS-Cov-2 infection and should not be used as the sole basis for treatment or other patient management decisions. A negative result may occur with  improper specimen collection/handling, submission of specimen other than nasopharyngeal swab, presence of viral mutation(s) within the areas targeted by this assay, and inadequate number of viral copies(<138 copies/mL). A negative result must be combined with clinical observations, patient history, and epidemiological information. The expected result is Negative.  Fact Sheet for Patients:  BloggerCourse.com  Fact Sheet for Healthcare Providers:  SeriousBroker.it  This test is no t yet approved or cleared by the [place] FDA and  has been authorized for detection and/or diagnosis of SARS-CoV-2 by FDA under an Emergency Use Authorization (EUA). This EUA will remain  in effect (meaning this test can be used) for the duration of the COVID-19 declaration under Section 564(b)(1) of the Act, 21 U.S.C.section 360bbb-3(b)(1), unless the authorization is terminated  or revoked sooner.       Influenza A by PCR NEGATIVE NEGATIVE Final  Influenza B by PCR NEGATIVE NEGATIVE Final    Comment: (NOTE) The Xpert Xpress SARS-CoV-2/FLU/RSV plus assay is intended as an aid in the diagnosis of influenza from Nasopharyngeal  swab specimens and should not be used as a sole basis for treatment. Nasal washings and aspirates are unacceptable for Xpert Xpress SARS-CoV-2/FLU/RSV testing.  Fact Sheet for Patients: BloggerCourse.com  Fact Sheet for Healthcare Providers: SeriousBroker.it  This test is not yet approved or cleared by the [place] FDA and has been authorized for detection and/or diagnosis of SARS-CoV-2 by FDA under an Emergency Use Authorization (EUA). This EUA will remain in effect (meaning this test can be used) for the duration of the COVID-19 declaration under Section 564(b)(1) of the Act, 21 U.S.C. section 360bbb-3(b)(1), unless the authorization is terminated or revoked.  Performed at [facility], [address], [place], [place] [address]   Culture, blood (Routine X 2) w Reflex to ID Panel     Status: None   Collection Time: 04-13-2021 10:15 PM   Specimen: BLOOD  Result Value Ref Range Status   Specimen Description BLOOD BLOOD RIGHT FOREARM  Final   Special Requests   Final    BOTTLES DRAWN AEROBIC AND ANAEROBIC Blood Culture results may not be optimal due to an excessive volume of blood received in culture bottles   Culture   Final    NO GROWTH 5 DAYS Performed at [facility], [address], [place], [place] [address]    Report Status 03/28/2021 FINAL  Final  MRSA PCR Screening     Status: Abnormal   Collection Time: 03/23/21 12:15 AM  Result Value Ref Range Status   MRSA by PCR POSITIVE (A) NEGATIVE Final    Comment:        The GeneXpert MRSA Assay (FDA approved for NASAL specimens only), is one component of a comprehensive MRSA colonization surveillance program. It is not intended to diagnose MRSA infection nor to guide or monitor treatment for MRSA infections. RESULT CALLED TO, READ BACK BY AND VERIFIED WITH: STACY, RN @ 0241 ON 03/23/21 Riesa Pope Performed at [facility],  [address], [place], [place] [address]        Radiology Studies: No results found.    Scheduled Meds: Continuous Infusions:  sodium chloride Stopped (03/29/21 0100)     LOS: 10 days      Time spent: 10 minutes   Noralee Stain, DO [facility] 04/01/2021, 11:57 AM   Available via Epic secure chat 7am-7pm After these hours, please refer to coverage provider listed on [email or web address]

## 2021-04-01 NOTE — TOC Progression Note (Signed)
Transition of Care [facility]) - Progression Note    Patient Details  Name: EVOLETT SOMARRIBA MRN: [id number] Date of Birth: [birth year]  Transition of Care [facility]) CM/SW Contact  Ida Rogue, [place] Phone Number: 04/01/2021, 10:22 AM  Clinical Narrative:   Called sister in [place] to follow up to palliative note from yesterday.  She stated she had just opened up the email today, and has not yet had time to review the options.  Furthermore, she states it is imperative that son who is a traveling RN currently in [place] be included in the process, and that in all likelihood she will not get back with me until tomorrow.  I impressed on her the importance of freeing up the bed ASAP. Dr Nira Retort updated. TOC will continue to follow during the course of hospitalization.     Expected Discharge Plan: Hospice Medical Facility Barriers to Discharge: Other (must enter comment) (Family coordination)  Expected Discharge Plan and Services Expected Discharge Plan: Hospice Medical Facility       Living arrangements for the past 2 months: Skilled Nursing Facility                                       Social Determinants of Health (SDOH) Interventions    Readmission Risk Interventions Readmission Risk Prevention Plan 09/18/2019 12/24/2018  Post Dischage Appt - Complete  Medication Screening - Complete  Transportation Screening Complete Complete  PCP or Specialist Appt within 5-7 Days Complete -  Home Care Screening Complete -  Medication Review (RN CM) Complete -  Some recent data might be hidden

## 2021-04-02 NOTE — Progress Notes (Signed)
Daily Progress Note   Patient Name: Janice Little       Date: 04/02/2021 DOB: Apr 14, 1946  Age: 75 y.o. MRN#: [id number] Attending Physician: Noralee Stain, DO Primary Care Physician: Juluis Rainier, MD Admit Date: 04-11-2021  Reason for Consultation/Follow-up: Establishing goals of care  Subjective: I saw and examined Janice Little today.  She was lying in bed in no distress but is actively dying.  Discussed with TOC.  [name] has received options for residential hospice and is currently reviewing.  I discussed with LCSW as well as Dr. Alvino Chapel concerned that she appears to be progressing and dying process and I am not sure that she will be stable enough to transfer to residential hospice.  We will plan to reassess tomorrow.  Length of Stay: 11  Current Medications: Scheduled Meds:     Continuous Infusions: . sodium chloride Stopped (03/29/21 0100)    PRN Meds: acetaminophen **OR** acetaminophen, antiseptic oral rinse, glycopyrrolate **OR** glycopyrrolate **OR** glycopyrrolate, haloperidol **OR** haloperidol **OR** haloperidol lactate, LORazepam **OR** LORazepam **OR** LORazepam, morphine injection, ondansetron **OR** ondansetron (ZOFRAN) IV, polyvinyl alcohol  Physical Exam         Somnolent.  Apneic periods noted. Some generalized edema Appears pale and weak, chronically ill Monitor noted S1-S2 Does not respond, does not verbalize   Vital Signs: BP (!) 202/99 (BP Location: Right Arm)   Pulse (!) 115   Temp 99 F (37.2 C) (Oral)   Resp 18   Wt 77.4 kg   SpO2 100%   BMI 28.40 kg/m  SpO2: SpO2: 100 % O2 Device: O2 Device: Room Air O2 Flow Rate: O2 Flow Rate (L/min): 2 L/min  Intake/output summary:  Intake/Output Summary (Last 24 hours) at 04/02/2021  0654 Last data filed at 04/02/2021 0532 Gross per 24 hour  Intake 0 ml  Output 650 ml  Net -650 ml    LBM: Last BM Date: 03/31/21 Baseline Weight: Weight: 71.6 kg Most recent weight: Weight: 77.4 kg      Palliative performance scale 20%. Palliative Assessment/Data:      Patient Active Problem List   Diagnosis Date Noted  . 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 History of CVA (cerebrovascular accident) 03/31/2021  . Chronic pain syndrome 03/31/2021  . Microcytic anemia 03/31/2021  . Palliative care by specialist   . Goals of care,  counseling/discussion   . Pressure ulcer of left leg 03/23/2021  . Severe sepsis (HCC) 04-01-2021  . AKI (acute kidney injury) (HCC) Apr 01, 2021  . Hyperkalemia 2021/04/01  . Hyponatremia 01-Apr-2021  . Hypoglycemia 04/01/2021  . Acute cholecystitis 10/20/2020  . Acute hypoxemic respiratory failure due to COVID-19 (HCC) 10/16/2020  . Acute metabolic encephalopathy 10/16/2020  . Gross hematuria   . Acute sepsis (HCC)   . Sepsis secondary to UTI (HCC)   . Diarrhea   . Elevated sed rate 08/12/2019  . Elevated C-reactive protein (CRP) 08/12/2019  . Left-sided neglect 08/01/2019  . Myalgia 08/01/2019  . Pressure injury of skin 03/04/2019  . Acute lower UTI   . Stroke (HCC) 03/02/2019  . Acute CVA (cerebrovascular accident) (HCC) 03/01/2019  . Hypertensive urgency 12/19/2018  . History of myelitis 12/26/2017  . History of optic neuritis 04/02/2015  . Diplopia 04/02/2015  . Urinary incontinence 04/02/2015  . Other fatigue 04/02/2015  . 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 Numbness 04/02/2015  . Ataxic gait 04/02/2015  . Essential tremor 04/02/2015  . PRIMARY HYPERPARATHYROIDISM 02/01/2008  . OSTEOPOROSIS 02/01/2008  . Type 2 diabetes mellitus with hypoglycemia without coma (HCC) 01/31/2008  . Hyperlipidemia 01/31/2008  . Multiple sclerosis (HCC) 01/31/2008  . Optic neuritis 01/31/2008  . Essential hypertension  01/31/2008  . GERD 01/31/2008    Palliative Care Assessment & Plan   Patient Profile:    Assessment:  75 year old lady from [facility] facility, known to palliative service seen in consultation in previous hospitalizations, admitted from facility with hypoglycemia and encephalopathy.  Reportedly noted to have been lethargic and having diarrhea and low blood sugars at nursing facility.  Admitted to hospital medicine service with neurology, nephrology and critical care specialist following.  Remains near obtunded in the ICU at [facility] in [place], [place], palliative consultation has been requested again for ongoing goals of care discussions.  Recommendations/Plan: Janice Little appears comfortable on my examination today.  Continue plan for comfort care. I spoke yesterday with patient's sister regarding residential hospice.  TOC has reached out to her today and she is still reviewing options.  I am concerned, however, that based upon changes from yesterday to today, Janice Little is reaching a point where she will not be stable to transition to residential hospice.  We will plan to reassess tomorrow for stability for transfer, but I also believe that it is likely at this point that she may have a hospital death rather than being able to be transitioned from the hospital.  Code Status: DNR DNI comfort care.     Code Status Orders  (From admission, onward)           Start     Ordered   03/23/21 0046  Limited resuscitation (code)  Continuous       Question Answer Comment  In the event of cardiac or respiratory ARREST: Initiate Code Blue, Call Rapid Response Yes   In the event of cardiac or respiratory ARREST: Perform CPR Yes   In the event of cardiac or respiratory ARREST: Perform Intubation/Mechanical Ventilation No   In the event of cardiac or respiratory ARREST: Use NIPPV/BiPAp only if indicated Yes   In the event of cardiac or respiratory ARREST: Administer  ACLS medications if indicated Yes   In the event of cardiac or respiratory ARREST: Perform Defibrillation or Cardioversion if indicated Yes      03/23/21 0045           Code  Status History     Date Active Date Inactive Code Status Order ID Comments User Context   10/16/2020 1747 10/24/2020 0300 Partial Code [id number]  Jae Dire, MD ED   09/16/2019 1138 09/25/2019 0333 Full Code [id number]  Josephine Igo, DO ED   03/01/2019 0133 03/06/2019 2142 Full Code [id number]  Eduard Clos, MD ED   12/19/2018 1951 12/26/2018 1602 Full Code [id number]  Elgergawy, Leana Roe, MD ED       Prognosis: Hours to a limited number of days  Discharge Planning: Hospice facility  Care plan was discussed with [facility], Dr. Alvino Chapel   Thank you for allowing the Palliative Medicine Team to assist in the care of this patient.   Total Time 20 Prolonged Time Billed  no    Greater than 50%  of this time was spent counseling and coordinating care related to the above assessment and plan.   [name], MD  Please contact Palliative Medicine Team phone at [phone] for questions and concerns.

## 2021-04-02 NOTE — Progress Notes (Signed)
PROGRESS NOTE    Janice Little  AOZ:[id number] DOB: 05/17/1946 DOA: 2021/04/18 PCP: Juluis Rainier, MD     Brief Narrative:  This 75 y.o. female with PMH significant of diabetes, chronic kidney disease stage III, chronic pain syndrome, chronic respiratory failure secondary to COPD, multiple sclerosis, GERD, essential hypertension and primary hyperparathyroidism s/p surgery who presents to the ED with hypoglycemia by EMS.  Patient apparently started having diarrhea  at [facility].  Her blood glucose was noted to be 34 then 70 and 25.  She was given glucagon x 4. She has not received any insulin at SNF.  When patient arrived the ER she appeared drowsy, unable to give any history.  Her initial blood sugar was 46.  She was given D10 and blood sugar went to 130.  She is so lethargic ,not much history was obtained.  Patient was noted to be in acute kidney failure with creatinine more than 4.0  It was previously normal. She was found to have hyperkalemia as well as lactic acidosis which has resolved.  Suspected pyuria and severe sepsis.  Her blood pressure is however stable.  She is hypoxic but has chronically been on 2 L of oxygen.    She was admitted as a case of severe sepsis with multiple electrolyte abnormalities.  Critical care , Nephrology and Neurology was consulted. Patient continues to remain lethargic, EEG no evidence of seizures, renal function has plateaud,  no further improvement.   Palliative care consulted to discuss goals of care.  Patient has poor prognosis. Family has made patient comfort care.  New events last 24 hours / Subjective: No acute change   Assessment & Plan:   Principal Problem:   Severe sepsis (HCC) Active Problems:   Type 2 diabetes mellitus with hypoglycemia without coma (HCC)   Hyperlipidemia   Multiple sclerosis (HCC)   Essential hypertension   GERD   Acute lower UTI   Acute metabolic encephalopathy   AKI (acute kidney injury) (HCC)   Hyperkalemia    Hyponatremia   Hypoglycemia   Pressure ulcer of left leg   Palliative care by specialist   Goals of care, counseling/discussion   Stercoral colitis   History of CVA (cerebrovascular accident)   Chronic pain syndrome   Microcytic anemia    Code Status: DNR Family Communication: None at bedside Disposition Plan:  Status is: Inpatient  Remains inpatient appropriate because: protect  Comfort care  Dispo: The patient is from: SNF              Anticipated d/c is to:  Residential hospice versus in-hospital death              Patient currently is not medically stable to d/c.   Difficult to place patient No     Antimicrobials:  Anti-infectives (From admission, onward)    Start     Dose/Rate Route Frequency Ordered Stop   03/24/21 2200  cefTRIAXone (ROCEPHIN) 2 g in sodium chloride 0.9 % 100 mL IVPB  Status:  Discontinued        2 g 200 mL/hr over 30 Minutes Intravenous Every 24 hours 03/24/21 0936 03/28/21 1109   03/23/21 2200  cefTRIAXone (ROCEPHIN) 1 g in sodium chloride 0.9 % 100 mL IVPB  Status:  Discontinued        1 g 200 mL/hr over 30 Minutes Intravenous Every 24 hours 03/23/21 0022 03/23/21 0916   03/23/21 2200  ceFEPIme (MAXIPIME) 2 g in sodium chloride 0.9 % 100 mL IVPB  Status:  Discontinued        2 g 200 mL/hr over 30 Minutes Intravenous Every 24 hours 03/23/21 0945 03/24/21 0936   03/23/21 0145  ceFEPIme (MAXIPIME) 2 g in sodium chloride 0.9 % 100 mL IVPB  Status:  Discontinued        2 g 200 mL/hr over 30 Minutes Intravenous  Once 03/23/21 0045 03/23/21 0046   03/10/2021 2130  ceFEPIme (MAXIPIME) 2 g in sodium chloride 0.9 % 100 mL IVPB        2 g 200 mL/hr over 30 Minutes Intravenous  Once 03/21/2021 2121 03/23/21 0046   03/19/2021 2130  metroNIDAZOLE (FLAGYL) IVPB 500 mg        500 mg 100 mL/hr over 60 Minutes Intravenous  Once 03/08/2021 2121 03/23/21 0130   03/24/2021 2130  vancomycin (VANCOCIN) IVPB 1000 mg/200 mL premix        1,000 mg 200 mL/hr over 60 Minutes  Intravenous  Once 03/06/2021 2121 03/23/21 0210        Objective: Vitals:   03/31/21 0734 03/31/21 0740 03/31/21 1538 03/31/21 2110  BP: (!) 183/92  (!) 167/90 (!) 202/99  Pulse: (!) 104  (!) 116 (!) 115  Resp: 16  14 18   Temp: 99.14 F (37.3 C)  98.9 F (37.2 C) 99 F (37.2 C)  TempSrc: Bladder  Axillary Oral  SpO2: 100% 100% 90% 100%  Weight:        Intake/Output Summary (Last 24 hours) at 04/02/2021 1157 Last data filed at 04/02/2021 0532 Gross per 24 hour  Intake 0 ml  Output 650 ml  Net -650 ml    Filed Weights   03/25/21 0500 03/29/21 0444 03/30/21 0500  Weight: 76.9 kg 77.3 kg 77.4 kg   Examination: General exam: Appears calm   Data Reviewed: I have personally reviewed following labs and imaging studies  CBC: Recent Labs  Lab 03/27/21 0329 03/28/21 0258  WBC 9.1  --   HGB 8.4* 8.5*  HCT 27.1* 26.2*  MCV 81.9  --   PLT 322  --     Basic Metabolic Panel: Recent Labs  Lab 03/27/21 0329 03/28/21 0258 03/29/21 0256  NA 133* 136 139  K 4.9 4.3 3.7  CL 102 105 104  CO2 22 23 25   GLUCOSE 106* 154* 218*  BUN 100* 111* 115*  CREATININE 3.26* 3.38* 3.24*  CALCIUM 8.5* 8.6* 8.8*    GFR: Estimated Creatinine Clearance: 15.2 mL/min (A) (by C-G formula based on SCr of 3.24 mg/dL (H)). Liver Function Tests: No results for input(s): AST, ALT, ALKPHOS, BILITOT, PROT, ALBUMIN in the last 168 hours. No results for input(s): LIPASE, AMYLASE in the last 168 hours. No results for input(s): AMMONIA in the last 168 hours.  Coagulation Profile: No results for input(s): INR, PROTIME in the last 168 hours. Cardiac Enzymes: No results for input(s): CKTOTAL, CKMB, CKMBINDEX, TROPONINI in the last 168 hours. BNP (last 3 results) No results for input(s): PROBNP in the last 8760 hours. HbA1C: No results for input(s): HGBA1C in the last 72 hours. CBG: Recent Labs  Lab 03/28/21 2023 03/28/21 2357 03/29/21 0314 03/29/21 0755 03/29/21 1147  GLUCAP 214* 200* 222*  263* 244*    Lipid Profile: No results for input(s): CHOL, HDL, LDLCALC, TRIG, CHOLHDL, LDLDIRECT in the last 72 hours. Thyroid Function Tests: No results for input(s): TSH, T4TOTAL, FREET4, T3FREE, THYROIDAB in the last 72 hours. Anemia Panel: No results for input(s): VITAMINB12, FOLATE, FERRITIN, TIBC, IRON, RETICCTPCT in the last  72 hours. Sepsis Labs: No results for input(s): PROCALCITON, LATICACIDVEN in the last 168 hours.  No results found for this or any previous visit (from the past 240 hour(s)).     Radiology Studies: No results found.    Scheduled Meds: Continuous Infusions:  sodium chloride Stopped (03/29/21 0100)     LOS: 11 days      Time spent: 10 minutes   Noralee Stain, DO [facility] 04/02/2021, 11:57 AM   Available via Epic secure chat 7am-7pm After these hours, please refer to coverage provider listed on [email or web address]

## 2021-04-02 NOTE — Progress Notes (Signed)
Daily Progress Note   Patient Name: Janice Little       Date: 04/02/2021 DOB: [birth year]  Age: 75 y.o. MRN#: [id number] Attending Physician: Noralee Stain, DO Primary Care Physician: Juluis Rainier, MD Admit Date: Apr 18, 2021  Reason for Consultation/Follow-up: Establishing goals of care  Subjective: I saw Janice Little today. She is lying in bed in NAD. She is somnolent and not responsive to touch or pain.  She did not move her gaze. She appears comfortable.   No family at bedside.   Length of Stay: 11  Current Medications: Scheduled Meds:    Continuous Infusions: . sodium chloride Stopped (03/29/21 0100)    PRN Meds: acetaminophen **OR** acetaminophen, antiseptic oral rinse, glycopyrrolate **OR** glycopyrrolate **OR** glycopyrrolate, haloperidol **OR** haloperidol **OR** haloperidol lactate, LORazepam **OR** LORazepam **OR** LORazepam, morphine injection, ondansetron **OR** ondansetron (ZOFRAN) IV, polyvinyl alcohol  Physical Exam Constitutional:      General: She is sleeping.     Interventions: Nasal cannula in place.  Cardiovascular:     Rate and Rhythm: Normal rate.     Pulses:          Radial pulses are 2+ on the right side and 2+ on the left side.       Dorsalis pedis pulses are 2+ on the right side and 2+ on the left side.       Posterior tibial pulses are 2+ on the right side and 2+ on the left side.  Pulmonary:     Effort: No tachypnea, accessory muscle usage or respiratory distress.     Comments: Mildly shallow inspirations,  Abdominal:     General: There is no distension.     Palpations: Abdomen is soft.  Musculoskeletal:     Left lower leg: 2+ Pitting Edema present.  Skin:    General: Skin is warm.     Capillary Refill: Capillary refill takes less than 2 seconds.             Vital Signs: BP (!) 202/99 (BP Location: Right Arm)   Pulse (!) 115   Temp 99 F (37.2 C) (Oral)   Resp 18   Wt 77.4 kg   SpO2 100%   BMI 28.40 kg/m  SpO2: SpO2: 100 % O2 Device: O2 Device: Room Air O2 Flow Rate: O2 Flow Rate (L/min): 2 L/min  Intake/output summary:  Intake/Output Summary (Last 24 hours) at 04/02/2021 1053 Last data filed at 04/02/2021 0532 Gross per 24 hour  Intake 0 ml  Output 650 ml  Net -650 ml   LBM: Last BM Date: 03/31/21 Baseline Weight: Weight: 71.6 kg Most recent weight: Weight: 77.4 kg       Palliative Assessment/Data:      Patient Active Problem List   Diagnosis Date Noted  . 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 History of CVA (cerebrovascular accident) 03/31/2021  . Chronic pain syndrome 03/31/2021  . Microcytic anemia 03/31/2021  . Palliative care by specialist   . Goals of care, counseling/discussion   . Pressure ulcer of left leg 03/23/2021  . Severe sepsis (HCC) 04-18-21  . AKI (acute kidney injury) (HCC) 2021/04/18  . Hyperkalemia 2021-04-18  . Hyponatremia April 18, 2021  . Hypoglycemia 2021-04-18  . Acute cholecystitis 10/20/2020  .  Acute hypoxemic respiratory failure due to COVID-19 (HCC) 10/16/2020  . Acute metabolic encephalopathy 10/16/2020  . Gross hematuria   . Acute sepsis (HCC)   . Sepsis secondary to UTI (HCC)   . Diarrhea   . Elevated sed rate 08/12/2019  . Elevated C-reactive protein (CRP) 08/12/2019  . Left-sided neglect 08/01/2019  . Myalgia 08/01/2019  . Pressure injury of skin 03/04/2019  . Acute lower UTI   . Stroke (HCC) 03/02/2019  . Acute CVA (cerebrovascular accident) (HCC) 03/01/2019  . Hypertensive urgency 12/19/2018  . History of myelitis 12/26/2017  . History of optic neuritis 04/02/2015  . Diplopia 04/02/2015  . Urinary incontinence 04/02/2015  . Other fatigue 04/02/2015  . 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 Numbness 04/02/2015  . Ataxic gait 04/02/2015   . Essential tremor 04/02/2015  . PRIMARY HYPERPARATHYROIDISM 02/01/2008  . OSTEOPOROSIS 02/01/2008  . Type 2 diabetes mellitus with hypoglycemia without coma (HCC) 01/31/2008  . Hyperlipidemia 01/31/2008  . Multiple sclerosis (HCC) 01/31/2008  . Optic neuritis 01/31/2008  . Essential hypertension 01/31/2008  . GERD 01/31/2008    Palliative Care Assessment & Plan   HPI: Janice Little is a 75 y.o. female, resident of [facility], with a PMH significant for diabetes, chronic kidney disease stage III, chronic pain syndrome, chronic respiratory failure secondary to COPD, multiple sclerosis, GERD, essential hypertension and primary hyperparathyroidism.  On Apr 09, 2021 she presented to the ED via EMS with hypoglyecmia and c/o of diarrhea. She was admitted with severe sepsis with multiple electrolyte abnormalities, hypoxia, and acute kidney failure.    Assessment: Critical care, nephrology and neurology was consulted. Poor prognosis and palliative care consulted to discuss goals of care. Family has made patient comfort care.  [name] left message with Palliative Care requesting pt be moved to hospice inpatient facility. However, after my assessment of patient at bedside, I do not think she would survive transport. Sister made aware of concerns and is agreeable to patient passing at hospital. No need for transport at this time since death appears imminent pt.   Recommendations/Plan: Continue comfort measures DNR  Goals of Care and Additional Recommendations: Limitations on Scope of Treatment: Full Comfort Care  Code Status: DNR  Prognosis:  Hours - Days  Discharge Planning: Anticipated Hospital Death   Thank you for allowing the Palliative Medicine Team to assist in the care of this patient.  Greater than 50%  of this time was spent counseling and coordinating care related to the above assessment and plan.  Janice Deist L. Manon Hilding, FNP-BC Palliative Medicine Team Team Phone  # [phone]  Pager # [phone]

## 2021-04-02 DEATH — deceased

## 2021-04-03 MED ORDER — LIP MEDEX EX OINT
TOPICAL_OINTMENT | CUTANEOUS | Status: AC
  Filled 2021-04-03: qty 7

## 2021-04-03 MED ORDER — MORPHINE SULFATE (PF) 2 MG/ML IV SOLN
2.0000 mg | Freq: Four times a day (QID) | INTRAVENOUS | Status: DC
  Administered 2021-04-03 – 2021-04-08 (×21): 2 mg via INTRAVENOUS
  Filled 2021-04-03 (×20): qty 1

## 2021-04-03 MED ORDER — MORPHINE SULFATE (PF) 2 MG/ML IV SOLN
2.0000 mg | INTRAVENOUS | Status: DC | PRN
  Administered 2021-04-04 – 2021-04-08 (×2): 2 mg via INTRAVENOUS
  Filled 2021-04-03 (×3): qty 1

## 2021-04-03 NOTE — Progress Notes (Signed)
PROGRESS NOTE    Janice Little  VPX:[id number] DOB: [birth year] DOA: April 05, 2021 PCP: Juluis Rainier, MD     Brief Narrative:  This 75 y.o. female with PMH significant of diabetes, chronic kidney disease stage III, chronic pain syndrome, chronic respiratory failure secondary to COPD, multiple sclerosis, GERD, essential hypertension and primary hyperparathyroidism s/p surgery who presents to the ED with hypoglycemia by EMS.  Patient apparently started having diarrhea  at [facility].  Her blood glucose was noted to be 34 then 70 and 25.  She was given glucagon x 4. She has not received any insulin at SNF.  When patient arrived the ER she appeared drowsy, unable to give any history.  Her initial blood sugar was 46.  She was given D10 and blood sugar went to 130.  She is so lethargic ,not much history was obtained.  Patient was noted to be in acute kidney failure with creatinine more than 4.0  It was previously normal. She was found to have hyperkalemia as well as lactic acidosis which has resolved.  Suspected pyuria and severe sepsis.  Her blood pressure is however stable.  She is hypoxic but has chronically been on 2 L of oxygen.    She was admitted as a case of severe sepsis with multiple electrolyte abnormalities.  Critical care , Nephrology and Neurology was consulted. Patient continues to remain lethargic, EEG no evidence of seizures, renal function has plateaud,  no further improvement.   Palliative care consulted to discuss goals of care.  Patient has poor prognosis. Family has made patient comfort care.  New events last 24 hours / Subjective: No change   Assessment & Plan:   Principal Problem:   Severe sepsis (HCC) Active Problems:   Type 2 diabetes mellitus with hypoglycemia without coma (HCC)   Hyperlipidemia   Multiple sclerosis (HCC)   Essential hypertension   GERD   Acute lower UTI   Acute metabolic encephalopathy   AKI (acute kidney injury) (HCC)   Hyperkalemia    Hyponatremia   Hypoglycemia   Pressure ulcer of left leg   Palliative care by specialist   Goals of care, counseling/discussion   Stercoral colitis   History of CVA (cerebrovascular accident)   Chronic pain syndrome   Microcytic anemia    Code Status: DNR Family Communication: None at bedside Disposition Plan:  Status is: Inpatient  Remains inpatient appropriate because: Anticipate hospital death     Antimicrobials:  Anti-infectives (From admission, onward)    Start     Dose/Rate Route Frequency Ordered Stop   03/24/21 2200  cefTRIAXone (ROCEPHIN) 2 g in sodium chloride 0.9 % 100 mL IVPB  Status:  Discontinued        2 g 200 mL/hr over 30 Minutes Intravenous Every 24 hours 03/24/21 0936 03/28/21 1109   03/23/21 2200  cefTRIAXone (ROCEPHIN) 1 g in sodium chloride 0.9 % 100 mL IVPB  Status:  Discontinued        1 g 200 mL/hr over 30 Minutes Intravenous Every 24 hours 03/23/21 0022 03/23/21 0916   03/23/21 2200  ceFEPIme (MAXIPIME) 2 g in sodium chloride 0.9 % 100 mL IVPB  Status:  Discontinued        2 g 200 mL/hr over 30 Minutes Intravenous Every 24 hours 03/23/21 0945 03/24/21 0936   03/23/21 0145  ceFEPIme (MAXIPIME) 2 g in sodium chloride 0.9 % 100 mL IVPB  Status:  Discontinued        2 g 200 mL/hr over 30 Minutes  Intravenous  Once 03/23/21 0045 03/23/21 0046   03/07/2021 2130  ceFEPIme (MAXIPIME) 2 g in sodium chloride 0.9 % 100 mL IVPB        2 g 200 mL/hr over 30 Minutes Intravenous  Once 03/10/2021 2121 03/23/21 0046   03/21/2021 2130  metroNIDAZOLE (FLAGYL) IVPB 500 mg        500 mg 100 mL/hr over 60 Minutes Intravenous  Once 04/01/2021 2121 03/23/21 0130   03/15/2021 2130  vancomycin (VANCOCIN) IVPB 1000 mg/200 mL premix        1,000 mg 200 mL/hr over 60 Minutes Intravenous  Once 03/17/2021 2121 03/23/21 0210        Objective: Vitals:   03/31/21 1538 03/31/21 2110 04/02/21 1329 04/02/21 2022  BP: (!) 167/90 (!) 202/99 (!) 149/68 (!) 187/94  Pulse: (!) 116 (!) 115  (!) 110 (!) 113  Resp: 14 18 20 16   Temp: 98.9 F (37.2 C) 99 F (37.2 C) 99.1 F (37.3 C) 98.9 F (37.2 C)  TempSrc: Axillary Oral Oral Oral  SpO2: 90% 100% 100% 100%  Weight:        Intake/Output Summary (Last 24 hours) at 04/03/2021 1326 Last data filed at 04/03/2021 0037 Gross per 24 hour  Intake --  Output 100 ml  Net -100 ml    Filed Weights   03/25/21 0500 03/29/21 0444 03/30/21 0500  Weight: 76.9 kg 77.3 kg 77.4 kg   Examination: General exam: Appears calm   Data Reviewed: I have personally reviewed following labs and imaging studies  CBC: Recent Labs  Lab 03/28/21 0258  HGB 8.5*  HCT 26.2*    Basic Metabolic Panel: Recent Labs  Lab 03/28/21 0258 03/29/21 0256  NA 136 139  K 4.3 3.7  CL 105 104  CO2 23 25  GLUCOSE 154* 218*  BUN 111* 115*  CREATININE 3.38* 3.24*  CALCIUM 8.6* 8.8*    GFR: Estimated Creatinine Clearance: 15.2 mL/min (A) (by C-G formula based on SCr of 3.24 mg/dL (H)). Liver Function Tests: No results for input(s): AST, ALT, ALKPHOS, BILITOT, PROT, ALBUMIN in the last 168 hours. No results for input(s): LIPASE, AMYLASE in the last 168 hours. No results for input(s): AMMONIA in the last 168 hours.  Coagulation Profile: No results for input(s): INR, PROTIME in the last 168 hours. Cardiac Enzymes: No results for input(s): CKTOTAL, CKMB, CKMBINDEX, TROPONINI in the last 168 hours. BNP (last 3 results) No results for input(s): PROBNP in the last 8760 hours. HbA1C: No results for input(s): HGBA1C in the last 72 hours. CBG: Recent Labs  Lab 03/28/21 2023 03/28/21 2357 03/29/21 0314 03/29/21 0755 03/29/21 1147  GLUCAP 214* 200* 222* 263* 244*    Lipid Profile: No results for input(s): CHOL, HDL, LDLCALC, TRIG, CHOLHDL, LDLDIRECT in the last 72 hours. Thyroid Function Tests: No results for input(s): TSH, T4TOTAL, FREET4, T3FREE, THYROIDAB in the last 72 hours. Anemia Panel: No results for input(s): VITAMINB12, FOLATE,  FERRITIN, TIBC, IRON, RETICCTPCT in the last 72 hours. Sepsis Labs: No results for input(s): PROCALCITON, LATICACIDVEN in the last 168 hours.  No results found for this or any previous visit (from the past 240 hour(s)).     Radiology Studies: No results found.    Scheduled Meds:   morphine injection  2 mg Intravenous Q6H   Continuous Infusions:  sodium chloride Stopped (03/29/21 0100)     LOS: 12 days      Time spent: 10 minutes   03/31/21, DO [facility] 04/03/2021, 1:26  PM   Available via Epic secure chat 7am-7pm After these hours, please refer to coverage provider listed on [email or web address]

## 2021-04-04 NOTE — Progress Notes (Signed)
PROGRESS NOTE    Janice Little  HAL:[id number] DOB: [birth year] DOA: 2021-04-21 PCP: Juluis Rainier, MD     Brief Narrative:  This 75 y.o. female with PMH significant of diabetes, chronic kidney disease stage III, chronic pain syndrome, chronic respiratory failure secondary to COPD, multiple sclerosis, GERD, essential hypertension and primary hyperparathyroidism s/p surgery who presents to the ED with hypoglycemia by EMS.  Patient apparently started having diarrhea  at [facility].  Her blood glucose was noted to be 34 then 70 and 25.  She was given glucagon x 4. She has not received any insulin at SNF.  When patient arrived the ER she appeared drowsy, unable to give any history.  Her initial blood sugar was 46.  She was given D10 and blood sugar went to 130.  She is so lethargic ,not much history was obtained.  Patient was noted to be in acute kidney failure with creatinine more than 4.0  It was previously normal. She was found to have hyperkalemia as well as lactic acidosis which has resolved.  Suspected pyuria and severe sepsis.  Her blood pressure is however stable.  She is hypoxic but has chronically been on 2 L of oxygen.    She was admitted as a case of severe sepsis with multiple electrolyte abnormalities.  Critical care , Nephrology and Neurology was consulted. Patient continues to remain lethargic, EEG no evidence of seizures, renal function has plateaud,  no further improvement.   Palliative care consulted to discuss goals of care.  Patient has poor prognosis. Family has made patient comfort care.  New events last 24 hours / Subjective: No change   Assessment & Plan:   Principal Problem:   Severe sepsis (HCC) Active Problems:   Type 2 diabetes mellitus with hypoglycemia without coma (HCC)   Hyperlipidemia   Multiple sclerosis (HCC)   Essential hypertension   GERD   Acute lower UTI   Acute metabolic encephalopathy   AKI (acute kidney injury) (HCC)   Hyperkalemia    Hyponatremia   Hypoglycemia   Pressure ulcer of left leg   Palliative care by specialist   Goals of care, counseling/discussion   Stercoral colitis   History of CVA (cerebrovascular accident)   Chronic pain syndrome   Microcytic anemia    Code Status: DNR Family Communication: None at bedside Disposition Plan:  Status is: Inpatient  Remains inpatient appropriate because: Anticipate hospital death     Antimicrobials:  Anti-infectives (From admission, onward)    Start     Dose/Rate Route Frequency Ordered Stop   03/24/21 2200  cefTRIAXone (ROCEPHIN) 2 g in sodium chloride 0.9 % 100 mL IVPB  Status:  Discontinued        2 g 200 mL/hr over 30 Minutes Intravenous Every 24 hours 03/24/21 0936 03/28/21 1109   03/23/21 2200  cefTRIAXone (ROCEPHIN) 1 g in sodium chloride 0.9 % 100 mL IVPB  Status:  Discontinued        1 g 200 mL/hr over 30 Minutes Intravenous Every 24 hours 03/23/21 0022 03/23/21 0916   03/23/21 2200  ceFEPIme (MAXIPIME) 2 g in sodium chloride 0.9 % 100 mL IVPB  Status:  Discontinued        2 g 200 mL/hr over 30 Minutes Intravenous Every 24 hours 03/23/21 0945 03/24/21 0936   03/23/21 0145  ceFEPIme (MAXIPIME) 2 g in sodium chloride 0.9 % 100 mL IVPB  Status:  Discontinued        2 g 200 mL/hr over 30 Minutes  Intravenous  Once 03/23/21 0045 03/23/21 0046   03/30/21 2130  ceFEPIme (MAXIPIME) 2 g in sodium chloride 0.9 % 100 mL IVPB        2 g 200 mL/hr over 30 Minutes Intravenous  Once 03/30/21 2121 03/23/21 0046   March 30, 2021 2130  metroNIDAZOLE (FLAGYL) IVPB 500 mg        500 mg 100 mL/hr over 60 Minutes Intravenous  Once 2021/03/30 2121 03/23/21 0130   March 30, 2021 2130  vancomycin (VANCOCIN) IVPB 1000 mg/200 mL premix        1,000 mg 200 mL/hr over 60 Minutes Intravenous  Once 03-30-21 2121 03/23/21 0210        Objective: Vitals:   03/31/21 2110 04/02/21 1329 04/02/21 2022 04/03/21 2030  BP: (!) 202/99 (!) 149/68 (!) 187/94 (!) 175/87  Pulse: (!) 115 (!) 110  (!) 113 (!) 106  Resp: 18 20 16 12   Temp: 99 F (37.2 C) 99.1 F (37.3 C) 98.9 F (37.2 C) 98.6 F (37 C)  TempSrc: Oral Oral Oral Oral  SpO2: 100% 100% 100% 98%  Weight:       No intake or output data in the 24 hours ending 04/04/21 0836  Filed Weights   03/25/21 0500 03/29/21 0444 03/30/21 0500  Weight: 76.9 kg 77.3 kg 77.4 kg   Examination: General exam: Somnolent   Data Reviewed: I have personally reviewed following labs and imaging studies  CBC: No results for input(s): WBC, NEUTROABS, HGB, HCT, MCV, PLT in the last 168 hours.  Basic Metabolic Panel: Recent Labs  Lab 03/29/21 0256  NA 139  K 3.7  CL 104  CO2 25  GLUCOSE 218*  BUN 115*  CREATININE 3.24*  CALCIUM 8.8*    GFR: Estimated Creatinine Clearance: 15.2 mL/min (A) (by C-G formula based on SCr of 3.24 mg/dL (H)). Liver Function Tests: No results for input(s): AST, ALT, ALKPHOS, BILITOT, PROT, ALBUMIN in the last 168 hours. No results for input(s): LIPASE, AMYLASE in the last 168 hours. No results for input(s): AMMONIA in the last 168 hours.  Coagulation Profile: No results for input(s): INR, PROTIME in the last 168 hours. Cardiac Enzymes: No results for input(s): CKTOTAL, CKMB, CKMBINDEX, TROPONINI in the last 168 hours. BNP (last 3 results) No results for input(s): PROBNP in the last 8760 hours. HbA1C: No results for input(s): HGBA1C in the last 72 hours. CBG: Recent Labs  Lab 03/28/21 2023 03/28/21 2357 03/29/21 0314 03/29/21 0755 03/29/21 1147  GLUCAP 214* 200* 222* 263* 244*    Lipid Profile: No results for input(s): CHOL, HDL, LDLCALC, TRIG, CHOLHDL, LDLDIRECT in the last 72 hours. Thyroid Function Tests: No results for input(s): TSH, T4TOTAL, FREET4, T3FREE, THYROIDAB in the last 72 hours. Anemia Panel: No results for input(s): VITAMINB12, FOLATE, FERRITIN, TIBC, IRON, RETICCTPCT in the last 72 hours. Sepsis Labs: No results for input(s): PROCALCITON, LATICACIDVEN in the last  168 hours.  No results found for this or any previous visit (from the past 240 hour(s)).     Radiology Studies: No results found.    Scheduled Meds:   morphine injection  2 mg Intravenous Q6H   Continuous Infusions:  sodium chloride Stopped (03/29/21 0100)     LOS: 13 days      Time spent: 10 minutes   03/31/21, DO [facility] 04/04/2021, 8:36 AM   Available via Epic secure chat 7am-7pm After these hours, please refer to coverage provider listed on [email or web address]

## 2021-04-04 NOTE — Progress Notes (Signed)
Daily Progress Note   Patient Name: Janice Little       Date: 04/04/2021 DOB: 02/21/1946  Age: 75 y.o. MRN#: [id number] Attending Physician: Noralee Stain, DO Primary Care Physician: Juluis Rainier, MD Admit Date: 2021/04/20  Reason for Consultation/Follow-up: Establishing goals of care  Subjective: I saw and examined Janice Little today.  She is somnolent and lying in bed.  No distress noted. She appears comfortable.   Length of Stay: 13  Current Medications: Scheduled Meds:  .  morphine injection  2 mg Intravenous Q6H    Continuous Infusions: . sodium chloride Stopped (03/29/21 0100)    PRN Meds: acetaminophen **OR** acetaminophen, antiseptic oral rinse, glycopyrrolate **OR** glycopyrrolate **OR** glycopyrrolate, haloperidol **OR** haloperidol **OR** haloperidol lactate, LORazepam **OR** LORazepam **OR** LORazepam, morphine injection, ondansetron **OR** ondansetron (ZOFRAN) IV, polyvinyl alcohol  Physical Exam Constitutional:      Comments: Somnolent  Cardiovascular:     Rate and Rhythm: Normal rate.     Pulses: Decreased pulses.  Pulmonary:     Effort: No tachypnea, accessory muscle usage or respiratory distress.     Comments: Mildly shallow inspirations, apenic periods noted Abdominal:     General: There is no distension.     Palpations: Abdomen is soft.  Musculoskeletal:     Left lower leg: 2+ Pitting Edema present.  Skin:    General: Skin is warm.            Vital Signs: BP (!) 175/87 (BP Location: Right Arm)   Pulse (!) 106   Temp 98.6 F (37 C) (Oral)   Resp 12   Wt 77.4 kg   SpO2 98%   BMI 28.40 kg/m  SpO2: SpO2: 98 % O2 Device: O2 Device: Room Air O2 Flow Rate: O2 Flow Rate (L/min): 2 L/min  Intake/output summary: No intake or output data in the 24 hours  ending 04/04/21 1601  LBM: Last BM Date: 04/02/21 Baseline Weight: Weight: 71.6 kg Most recent weight: Weight: 77.4 kg       Palliative Assessment/Data:      Patient Active Problem List   Diagnosis Date Noted  . 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 History of CVA (cerebrovascular accident) 03/31/2021  . Chronic pain syndrome 03/31/2021  . Microcytic anemia 03/31/2021  . Palliative care by specialist   . Goals of care, counseling/discussion   . Pressure ulcer of left leg 03/23/2021  . Severe sepsis (HCC) 2021-04-20  . AKI (acute kidney injury) (HCC) 04/20/21  . Hyperkalemia 04-20-21  . Hyponatremia Apr 20, 2021  . Hypoglycemia 2021/04/20  . Acute cholecystitis 10/20/2020  . Acute hypoxemic respiratory failure due to COVID-19 (HCC) 10/16/2020  . Acute metabolic encephalopathy 10/16/2020  . Gross hematuria   . Acute sepsis (HCC)   . Sepsis secondary to UTI (HCC)   . Diarrhea   . Elevated sed rate 08/12/2019  . Elevated C-reactive protein (CRP) 08/12/2019  . Left-sided neglect 08/01/2019  . Myalgia 08/01/2019  . Pressure injury of skin 03/04/2019  . Acute lower UTI   . Stroke (HCC) 03/02/2019  . Acute CVA (cerebrovascular accident) (HCC) 03/01/2019  . Hypertensive urgency 12/19/2018  . History of myelitis 12/26/2017  . History of optic neuritis 04/02/2015  . Diplopia 04/02/2015  .  Urinary incontinence 04/02/2015  . Other fatigue 04/02/2015  . 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 Numbness 04/02/2015  . Ataxic gait 04/02/2015  . Essential tremor 04/02/2015  . PRIMARY HYPERPARATHYROIDISM 02/01/2008  . OSTEOPOROSIS 02/01/2008  . Type 2 diabetes mellitus with hypoglycemia without coma (HCC) 01/31/2008  . Hyperlipidemia 01/31/2008  . Multiple sclerosis (HCC) 01/31/2008  . Optic neuritis 01/31/2008  . Essential hypertension 01/31/2008  . GERD 01/31/2008    Palliative Care Assessment & Plan   HPI: Janice Little is a 75 y.o. female,  resident of [facility], with a PMH significant for diabetes, chronic kidney disease stage III, chronic pain syndrome, chronic respiratory failure secondary to COPD, multiple sclerosis, GERD, essential hypertension and primary hyperparathyroidism.  On Apr 11, 2021 she presented to the ED via EMS with hypoglyecmia and c/o of diarrhea. She was admitted with severe sepsis with multiple electrolyte abnormalities, hypoxia, and acute kidney failure.    Assessment: Critical care, nephrology and neurology was consulted. Poor prognosis and palliative care consulted to discuss goals of care. Family has made patient comfort care.  I do not think she would survive transport to hospice facility.  Recommendations/Plan: Actively dying. Appears comfortable.  Continue same. Anticipate hospital death.  Goals of Care and Additional Recommendations: Limitations on Scope of Treatment: Full Comfort Care  Code Status: DNR  Prognosis:  Hours - Days  Discharge Planning: Anticipated Hospital Death  Thank you for allowing the Palliative Medicine Team to assist in the care of this patient.  Total time: 15 minutes  Greater than 50%  of this time was spent counseling and coordinating care related to the above assessment and plan.  [name], MD [facility] Palliative Medicine Team [phone]

## 2021-04-04 NOTE — Progress Notes (Signed)
Daily Progress Note   Patient Name: Janice Little       Date: 04/04/2021 DOB: [birth year]  Age: 75 y.o. MRN#: [id number] Attending Physician: Noralee Stain, DO Primary Care Physician: Juluis Rainier, MD Admit Date: 10-Apr-2021  Reason for Consultation/Follow-up: Establishing goals of care  Subjective: I saw and examined Janice Little today.  She is somnolent and lying in bed.  No distress noted. She appears comfortable.   No family at bedside.  I called and updated her sister, Janice Little.  Length of Stay: 13  Current Medications: Scheduled Meds:  .  morphine injection  2 mg Intravenous Q6H    Continuous Infusions: . sodium chloride Stopped (03/29/21 0100)    PRN Meds: acetaminophen **OR** acetaminophen, antiseptic oral rinse, glycopyrrolate **OR** glycopyrrolate **OR** glycopyrrolate, haloperidol **OR** haloperidol **OR** haloperidol lactate, LORazepam **OR** LORazepam **OR** LORazepam, morphine injection, ondansetron **OR** ondansetron (ZOFRAN) IV, polyvinyl alcohol  Physical Exam Constitutional:      Comments: Somnolent  Cardiovascular:     Rate and Rhythm: Normal rate.     Pulses: Decreased pulses.  Pulmonary:     Effort: No tachypnea, accessory muscle usage or respiratory distress.     Comments: Mildly shallow inspirations, apenic periods noted Abdominal:     General: There is no distension.     Palpations: Abdomen is soft.  Musculoskeletal:     Left lower leg: 2+ Pitting Edema present.  Skin:    General: Skin is warm.            Vital Signs: BP (!) 175/87 (BP Location: Right Arm)   Pulse (!) 106   Temp 98.6 F (37 C) (Oral)   Resp 12   Wt 77.4 kg   SpO2 98%   BMI 28.40 kg/m  SpO2: SpO2: 98 % O2 Device: O2 Device: Room Air O2 Flow Rate: O2 Flow Rate (L/min): 2  L/min  Intake/output summary: No intake or output data in the 24 hours ending 04/04/21 0647  LBM: Last BM Date: 04/02/21 Baseline Weight: Weight: 71.6 kg Most recent weight: Weight: 77.4 kg       Palliative Assessment/Data:      Patient Active Problem List   Diagnosis Date Noted  . 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 History of CVA (cerebrovascular accident) 03/31/2021  . Chronic pain syndrome 03/31/2021  . Microcytic anemia 03/31/2021  . Palliative care by specialist   . Goals of care, counseling/discussion   . Pressure ulcer of left leg 03/23/2021  . Severe sepsis (HCC) 2021/04/10  . AKI (acute kidney injury) (HCC) 2021-04-10  . Hyperkalemia Apr 10, 2021  . Hyponatremia 2021/04/10  . Hypoglycemia 04-10-2021  . Acute cholecystitis 10/20/2020  . Acute hypoxemic respiratory failure due to COVID-19 (HCC) 10/16/2020  . Acute metabolic encephalopathy 10/16/2020  . Gross hematuria   . Acute sepsis (HCC)   . Sepsis secondary to UTI (HCC)   . Diarrhea   . Elevated sed rate 08/12/2019  . Elevated C-reactive protein (CRP) 08/12/2019  . Left-sided neglect 08/01/2019  . Myalgia 08/01/2019  . Pressure injury of skin 03/04/2019  . Acute lower UTI   . Stroke (HCC) 03/02/2019  . Acute CVA (cerebrovascular accident) (HCC) 03/01/2019  . Hypertensive urgency 12/19/2018  . History of myelitis  12/26/2017  . History of optic neuritis 04/02/2015  . Diplopia 04/02/2015  . Urinary incontinence 04/02/2015  . Other fatigue 04/02/2015  . 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 Numbness 04/02/2015  . Ataxic gait 04/02/2015  . Essential tremor 04/02/2015  . PRIMARY HYPERPARATHYROIDISM 02/01/2008  . OSTEOPOROSIS 02/01/2008  . Type 2 diabetes mellitus with hypoglycemia without coma (HCC) 01/31/2008  . Hyperlipidemia 01/31/2008  . Multiple sclerosis (HCC) 01/31/2008  . Optic neuritis 01/31/2008  . Essential hypertension 01/31/2008  . GERD 01/31/2008    Palliative  Care Assessment & Plan   HPI: Janice Little is a 75 y.o. female, resident of [facility], with a PMH significant for diabetes, chronic kidney disease stage III, chronic pain syndrome, chronic respiratory failure secondary to COPD, multiple sclerosis, GERD, essential hypertension and primary hyperparathyroidism.  On 03/25/2021 she presented to the ED via EMS with hypoglyecmia and c/o of diarrhea. She was admitted with severe sepsis with multiple electrolyte abnormalities, hypoxia, and acute kidney failure.    Assessment: Critical care, nephrology and neurology was consulted. Poor prognosis and palliative care consulted to discuss goals of care. Family has made patient comfort care.  I do not think she would survive transport to hospice facility.  Recommendations/Plan: [name] is actively dying.  Continue comfort measures. I called and updated her sister, Janice Little.  Goals of Care and Additional Recommendations: Limitations on Scope of Treatment: Full Comfort Care  Code Status: DNR  Prognosis:  Hours - Days  Discharge Planning: Anticipated Hospital Death   Thank you for allowing the Palliative Medicine Team to assist in the care of this patient.  Total time: 15 minutes  Greater than 50%  of this time was spent counseling and coordinating care related to the above assessment and plan.  Janice Minus, MD [facility] Palliative Medicine Team [phone]

## 2021-04-05 NOTE — Progress Notes (Signed)
Daily Progress Note   Patient Name: Janice Little       Date: 04/05/2021 DOB: Feb 29, 1946  Age: 75 y.o. MRN#: [id number] Attending Physician: Noralee Stain, DO Primary Care Physician: Juluis Rainier, MD Admit Date: 03/16/2021  Reason for Consultation/Follow-up: Establishing goals of care  Subjective: I saw and examined Janice Little.  She was lying in bed in no distress.    I called and updated her sister.  Length of Stay: 14  Current Medications: Scheduled Meds:  .  morphine injection  2 mg Intravenous Q6H    Continuous Infusions: . sodium chloride Stopped (03/29/21 0100)    PRN Meds: acetaminophen **OR** acetaminophen, antiseptic oral rinse, glycopyrrolate **OR** glycopyrrolate **OR** glycopyrrolate, haloperidol **OR** haloperidol **OR** haloperidol lactate, LORazepam **OR** LORazepam **OR** LORazepam, morphine injection, ondansetron **OR** ondansetron (ZOFRAN) IV, polyvinyl alcohol  Physical Exam Constitutional:      Comments: Somnolent  Cardiovascular:     Rate and Rhythm: Normal rate.     Pulses: Decreased pulses.  Pulmonary:     Effort: No tachypnea, accessory muscle usage or respiratory distress.     Comments: Mildly shallow inspirations, apenic periods noted Abdominal:     General: There is no distension.     Palpations: Abdomen is soft.  Musculoskeletal:     Left lower leg: 2+ Pitting Edema present.  Skin:    General: Skin is warm.            Vital Signs: BP (!) 190/100 (BP Location: Left Arm)   Pulse (!) 107   Temp 98.4 F (36.9 C) (Oral)   Resp 16   Wt 77.4 kg   SpO2 100%   BMI 28.40 kg/m  SpO2: SpO2: 100 % O2 Device: O2 Device: Room Air O2 Flow Rate: O2 Flow Rate (L/min): 2 L/min  Intake/output summary: No intake or output data in the 24 hours ending  04/05/21 1853  LBM: Last BM Date: 04/02/21 Baseline Weight: Weight: 71.6 kg Most recent weight: Weight: 77.4 kg       Palliative Assessment/Data:      Patient Active Problem List   Diagnosis Date Noted  . 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 History of CVA (cerebrovascular accident) 03/31/2021  . Chronic pain syndrome 03/31/2021  . Microcytic anemia 03/31/2021  . Palliative care by specialist   . Goals of care, counseling/discussion   . Pressure ulcer of left leg 03/23/2021  . Severe sepsis (HCC) 03/08/2021  . AKI (acute kidney injury) (HCC) 03/29/2021  . Hyperkalemia 03/20/2021  . Hyponatremia 03/27/2021  . Hypoglycemia 03/09/2021  . Acute cholecystitis 10/20/2020  . Acute hypoxemic respiratory failure due to COVID-19 (HCC) 10/16/2020  . Acute metabolic encephalopathy 10/16/2020  . Gross hematuria   . Acute sepsis (HCC)   . Sepsis secondary to UTI (HCC)   . Diarrhea   . Elevated sed rate 08/12/2019  . Elevated C-reactive protein (CRP) 08/12/2019  . Left-sided neglect 08/01/2019  . Myalgia 08/01/2019  . Pressure injury of skin 03/04/2019  . Acute lower UTI   . Stroke (HCC) 03/02/2019  . Acute CVA (cerebrovascular accident) (HCC) 03/01/2019  . Hypertensive urgency 12/19/2018  . History of myelitis 12/26/2017  . History of optic neuritis 04/02/2015  . Diplopia 04/02/2015  .  Urinary incontinence 04/02/2015  . Other fatigue 04/02/2015  . 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 Numbness 04/02/2015  . Ataxic gait 04/02/2015  . Essential tremor 04/02/2015  . PRIMARY HYPERPARATHYROIDISM 02/01/2008  . OSTEOPOROSIS 02/01/2008  . Type 2 diabetes mellitus with hypoglycemia without coma (HCC) 01/31/2008  . Hyperlipidemia 01/31/2008  . Multiple sclerosis (HCC) 01/31/2008  . Optic neuritis 01/31/2008  . Essential hypertension 01/31/2008  . GERD 01/31/2008    Palliative Care Assessment & Plan   HPI: Janice Little is a 75 y.o. female, resident  of [facility], with a PMH significant for diabetes, chronic kidney disease stage III, chronic pain syndrome, chronic respiratory failure secondary to COPD, multiple sclerosis, GERD, essential hypertension and primary hyperparathyroidism.  On 03/28/2021 she presented to the ED via EMS with hypoglyecmia and c/o of diarrhea. She was admitted with severe sepsis with multiple electrolyte abnormalities, hypoxia, and acute kidney failure.    Assessment: Critical care, nephrology and neurology was consulted. Poor prognosis and palliative care consulted to discuss goals of care. Family has made patient comfort care.  I do not think she would survive transport to hospice facility.  Recommendations/Plan: Actively dying. Appears comfortable.  Continue same. Anticipate hospital death. I called and updated her sister.  Goals of Care and Additional Recommendations: Limitations on Scope of Treatment: Full Comfort Care  Code Status: DNR  Prognosis:  Hours - Days  Discharge Planning: Anticipated Hospital Death  Thank you for allowing the Palliative Medicine Team to assist in the care of this patient.  Total time: 15 minutes Greater than 50%  of this time was spent counseling and coordinating care related to the above assessment and plan.  [name], MD [facility] Palliative Medicine Team [phone]

## 2021-04-05 NOTE — Progress Notes (Signed)
PROGRESS NOTE    Janice Little  HAL:[id number] DOB: [birth year] DOA: 2021-04-21 PCP: Juluis Rainier, MD     Brief Narrative:  This 75 y.o. female with PMH significant of diabetes, chronic kidney disease stage III, chronic pain syndrome, chronic respiratory failure secondary to COPD, multiple sclerosis, GERD, essential hypertension and primary hyperparathyroidism s/p surgery who presents to the ED with hypoglycemia by EMS.  Patient apparently started having diarrhea  at [facility].  Her blood glucose was noted to be 34 then 70 and 25.  She was given glucagon x 4. She has not received any insulin at SNF.  When patient arrived the ER she appeared drowsy, unable to give any history.  Her initial blood sugar was 46.  She was given D10 and blood sugar went to 130.  She is so lethargic ,not much history was obtained.  Patient was noted to be in acute kidney failure with creatinine more than 4.0  It was previously normal. She was found to have hyperkalemia as well as lactic acidosis which has resolved.  Suspected pyuria and severe sepsis.  Her blood pressure is however stable.  She is hypoxic but has chronically been on 2 L of oxygen.    She was admitted as a case of severe sepsis with multiple electrolyte abnormalities.  Critical care , Nephrology and Neurology was consulted. Patient continues to remain lethargic, EEG no evidence of seizures, renal function has plateaud,  no further improvement.   Palliative care consulted to discuss goals of care.  Patient has poor prognosis. Family has made patient comfort care.  New events last 24 hours / Subjective: No change   Assessment & Plan:   Principal Problem:   Severe sepsis (HCC) Active Problems:   Type 2 diabetes mellitus with hypoglycemia without coma (HCC)   Hyperlipidemia   Multiple sclerosis (HCC)   Essential hypertension   GERD   Acute lower UTI   Acute metabolic encephalopathy   AKI (acute kidney injury) (HCC)   Hyperkalemia    Hyponatremia   Hypoglycemia   Pressure ulcer of left leg   Palliative care by specialist   Goals of care, counseling/discussion   Stercoral colitis   History of CVA (cerebrovascular accident)   Chronic pain syndrome   Microcytic anemia    Code Status: DNR Family Communication: None at bedside Disposition Plan:  Status is: Inpatient  Remains inpatient appropriate because: Anticipate hospital death     Antimicrobials:  Anti-infectives (From admission, onward)    Start     Dose/Rate Route Frequency Ordered Stop   03/24/21 2200  cefTRIAXone (ROCEPHIN) 2 g in sodium chloride 0.9 % 100 mL IVPB  Status:  Discontinued        2 g 200 mL/hr over 30 Minutes Intravenous Every 24 hours 03/24/21 0936 03/28/21 1109   03/23/21 2200  cefTRIAXone (ROCEPHIN) 1 g in sodium chloride 0.9 % 100 mL IVPB  Status:  Discontinued        1 g 200 mL/hr over 30 Minutes Intravenous Every 24 hours 03/23/21 0022 03/23/21 0916   03/23/21 2200  ceFEPIme (MAXIPIME) 2 g in sodium chloride 0.9 % 100 mL IVPB  Status:  Discontinued        2 g 200 mL/hr over 30 Minutes Intravenous Every 24 hours 03/23/21 0945 03/24/21 0936   03/23/21 0145  ceFEPIme (MAXIPIME) 2 g in sodium chloride 0.9 % 100 mL IVPB  Status:  Discontinued        2 g 200 mL/hr over 30 Minutes  Intravenous  Once 03/23/21 0045 03/23/21 0046   03/23/2021 2130  ceFEPIme (MAXIPIME) 2 g in sodium chloride 0.9 % 100 mL IVPB        2 g 200 mL/hr over 30 Minutes Intravenous  Once 03/08/2021 2121 03/23/21 0046   03/20/2021 2130  metroNIDAZOLE (FLAGYL) IVPB 500 mg        500 mg 100 mL/hr over 60 Minutes Intravenous  Once 03/15/2021 2121 03/23/21 0130   03/27/2021 2130  vancomycin (VANCOCIN) IVPB 1000 mg/200 mL premix        1,000 mg 200 mL/hr over 60 Minutes Intravenous  Once 03/14/2021 2121 03/23/21 0210        Objective: Vitals:   04/02/21 1329 04/02/21 2022 04/03/21 2030 04/04/21 2139  BP: (!) 149/68 (!) 187/94 (!) 175/87 (!) 190/100  Pulse: (!) 110 (!) 113  (!) 106 (!) 107  Resp: 20 16 12 16   Temp: 99.1 F (37.3 C) 98.9 F (37.2 C) 98.6 F (37 C) 98.4 F (36.9 C)  TempSrc: Oral Oral Oral Oral  SpO2: 100% 100% 98% 100%  Weight:       No intake or output data in the 24 hours ending 04/05/21 0904  Filed Weights   03/25/21 0500 03/29/21 0444 03/30/21 0500  Weight: 76.9 kg 77.3 kg 77.4 kg   Examination: General exam: Somnolent    Data Reviewed: I have personally reviewed following labs and imaging studies  CBC: No results for input(s): WBC, NEUTROABS, HGB, HCT, MCV, PLT in the last 168 hours.  Basic Metabolic Panel: No results for input(s): NA, K, CL, CO2, GLUCOSE, BUN, CREATININE, CALCIUM, MG, PHOS in the last 168 hours.  GFR: Estimated Creatinine Clearance: 15.2 mL/min (A) (by C-G formula based on SCr of 3.24 mg/dL (H)). Liver Function Tests: No results for input(s): AST, ALT, ALKPHOS, BILITOT, PROT, ALBUMIN in the last 168 hours. No results for input(s): LIPASE, AMYLASE in the last 168 hours. No results for input(s): AMMONIA in the last 168 hours.  Coagulation Profile: No results for input(s): INR, PROTIME in the last 168 hours. Cardiac Enzymes: No results for input(s): CKTOTAL, CKMB, CKMBINDEX, TROPONINI in the last 168 hours. BNP (last 3 results) No results for input(s): PROBNP in the last 8760 hours. HbA1C: No results for input(s): HGBA1C in the last 72 hours. CBG: Recent Labs  Lab 03/29/21 1147  GLUCAP 244*    Lipid Profile: No results for input(s): CHOL, HDL, LDLCALC, TRIG, CHOLHDL, LDLDIRECT in the last 72 hours. Thyroid Function Tests: No results for input(s): TSH, T4TOTAL, FREET4, T3FREE, THYROIDAB in the last 72 hours. Anemia Panel: No results for input(s): VITAMINB12, FOLATE, FERRITIN, TIBC, IRON, RETICCTPCT in the last 72 hours. Sepsis Labs: No results for input(s): PROCALCITON, LATICACIDVEN in the last 168 hours.  No results found for this or any previous visit (from the past 240 hour(s)).      Radiology Studies: No results found.    Scheduled Meds:   morphine injection  2 mg Intravenous Q6H   Continuous Infusions:  sodium chloride Stopped (03/29/21 0100)     LOS: 14 days      Time spent: 10 minutes   03/31/21, DO [facility] 04/05/2021, 9:04 AM   Available via Epic secure chat 7am-7pm After these hours, please refer to coverage provider listed on [email or web address]

## 2021-04-06 NOTE — Progress Notes (Signed)
PROGRESS NOTE    Janice Little  URK:[id number] DOB: 1946-01-08 DOA: Apr 17, 2021 PCP: Juluis Rainier, MD     Brief Narrative:  This 75 y.o. female with PMH significant of diabetes, chronic kidney disease stage III, chronic pain syndrome, chronic respiratory failure secondary to COPD, multiple sclerosis, GERD, essential hypertension and primary hyperparathyroidism s/p surgery who presents to the ED with hypoglycemia by EMS.  Patient apparently started having diarrhea  at [facility].  Her blood glucose was noted to be 34 then 70 and 25.  She was given glucagon x 4. She has not received any insulin at SNF.  When patient arrived the ER she appeared drowsy, unable to give any history.  Her initial blood sugar was 46.  She was given D10 and blood sugar went to 130.  She is so lethargic ,not much history was obtained.  Patient was noted to be in acute kidney failure with creatinine more than 4.0  It was previously normal. She was found to have hyperkalemia as well as lactic acidosis which has resolved.  Suspected pyuria and severe sepsis.  Her blood pressure is however stable.  She is hypoxic but has chronically been on 2 L of oxygen.    She was admitted as a case of severe sepsis with multiple electrolyte abnormalities.  Critical care , Nephrology and Neurology was consulted. Patient continues to remain lethargic, EEG no evidence of seizures, renal function has plateaud,  no further improvement.   Palliative care consulted to discuss goals of care.  Patient has poor prognosis. Family has made patient comfort care.  New events last 24 hours / Subjective: Patient remains unresponsive  Assessment & Plan:   Principal Problem:   Severe sepsis (HCC) Active Problems:   Type 2 diabetes mellitus with hypoglycemia without coma (HCC)   Hyperlipidemia   Multiple sclerosis (HCC)   Essential hypertension   GERD   Acute lower UTI   Acute metabolic encephalopathy   AKI (acute kidney injury) (HCC)    Hyperkalemia   Hyponatremia   Hypoglycemia   Pressure ulcer of left leg   Palliative care by specialist   Goals of care, counseling/discussion   Stercoral colitis   History of CVA (cerebrovascular accident)   Chronic pain syndrome   Microcytic anemia    Code Status: DNR Family Communication: None at bedside Disposition Plan:  Status is: Inpatient  Remains inpatient appropriate because: Anticipate hospital death     Antimicrobials:  Anti-infectives (From admission, onward)    Start     Dose/Rate Route Frequency Ordered Stop   03/24/21 2200  cefTRIAXone (ROCEPHIN) 2 g in sodium chloride 0.9 % 100 mL IVPB  Status:  Discontinued        2 g 200 mL/hr over 30 Minutes Intravenous Every 24 hours 03/24/21 0936 03/28/21 1109   03/23/21 2200  cefTRIAXone (ROCEPHIN) 1 g in sodium chloride 0.9 % 100 mL IVPB  Status:  Discontinued        1 g 200 mL/hr over 30 Minutes Intravenous Every 24 hours 03/23/21 0022 03/23/21 0916   03/23/21 2200  ceFEPIme (MAXIPIME) 2 g in sodium chloride 0.9 % 100 mL IVPB  Status:  Discontinued        2 g 200 mL/hr over 30 Minutes Intravenous Every 24 hours 03/23/21 0945 03/24/21 0936   03/23/21 0145  ceFEPIme (MAXIPIME) 2 g in sodium chloride 0.9 % 100 mL IVPB  Status:  Discontinued        2 g 200 mL/hr over 30 Minutes  Intravenous  Once 03/23/21 0045 03/23/21 0046   April 15, 2021 2130  ceFEPIme (MAXIPIME) 2 g in sodium chloride 0.9 % 100 mL IVPB        2 g 200 mL/hr over 30 Minutes Intravenous  Once 2021/04/15 2121 03/23/21 0046   15-Apr-2021 2130  metroNIDAZOLE (FLAGYL) IVPB 500 mg        500 mg 100 mL/hr over 60 Minutes Intravenous  Once April 15, 2021 2121 03/23/21 0130   2021-04-15 2130  vancomycin (VANCOCIN) IVPB 1000 mg/200 mL premix        1,000 mg 200 mL/hr over 60 Minutes Intravenous  Once 04-15-2021 2121 03/23/21 0210        Objective: Vitals:   04/02/21 2022 04/03/21 2030 04/04/21 2139 04/05/21 2332  BP: (!) 187/94 (!) 175/87 (!) 190/100 (!) 149/91  Pulse:  (!) 113 (!) 106 (!) 107 100  Resp: 16 12 16 12   Temp: 98.9 F (37.2 C) 98.6 F (37 C) 98.4 F (36.9 C) 99 F (37.2 C)  TempSrc: Oral Oral Oral Axillary  SpO2: 100% 98% 100% 100%  Weight:       No intake or output data in the 24 hours ending 04/06/21 1142  Filed Weights   03/25/21 0500 03/29/21 0444 03/30/21 0500  Weight: 76.9 kg 77.3 kg 77.4 kg   Examination: General exam: Somnolent  Data Reviewed: I have personally reviewed following labs and imaging studies  CBC: No results for input(s): WBC, NEUTROABS, HGB, HCT, MCV, PLT in the last 168 hours.  Basic Metabolic Panel: No results for input(s): NA, K, CL, CO2, GLUCOSE, BUN, CREATININE, CALCIUM, MG, PHOS in the last 168 hours.  GFR: Estimated Creatinine Clearance: 15.2 mL/min (A) (by C-G formula based on SCr of 3.24 mg/dL (H)). Liver Function Tests: No results for input(s): AST, ALT, ALKPHOS, BILITOT, PROT, ALBUMIN in the last 168 hours. No results for input(s): LIPASE, AMYLASE in the last 168 hours. No results for input(s): AMMONIA in the last 168 hours.  Coagulation Profile: No results for input(s): INR, PROTIME in the last 168 hours. Cardiac Enzymes: No results for input(s): CKTOTAL, CKMB, CKMBINDEX, TROPONINI in the last 168 hours. BNP (last 3 results) No results for input(s): PROBNP in the last 8760 hours. HbA1C: No results for input(s): HGBA1C in the last 72 hours. CBG: No results for input(s): GLUCAP in the last 168 hours.  Lipid Profile: No results for input(s): CHOL, HDL, LDLCALC, TRIG, CHOLHDL, LDLDIRECT in the last 72 hours. Thyroid Function Tests: No results for input(s): TSH, T4TOTAL, FREET4, T3FREE, THYROIDAB in the last 72 hours. Anemia Panel: No results for input(s): VITAMINB12, FOLATE, FERRITIN, TIBC, IRON, RETICCTPCT in the last 72 hours. Sepsis Labs: No results for input(s): PROCALCITON, LATICACIDVEN in the last 168 hours.  No results found for this or any previous visit (from the past 240  hour(s)).     Radiology Studies: No results found.    Scheduled Meds:   morphine injection  2 mg Intravenous Q6H   Continuous Infusions:  sodium chloride Stopped (03/29/21 0100)     LOS: 15 days      Time spent: 10 minutes   03/31/21, DO [facility] 04/06/2021, 11:42 AM   Available via Epic secure chat 7am-7pm After these hours, please refer to coverage provider listed on [email or web address]

## 2021-04-07 NOTE — Progress Notes (Signed)
[facility] PROGRESS NOTE  Janice Little  UJW:[id number] DOB: 1946/04/26 DOA: Mar 25, 2021  Brief Narrative: This 75 y.o. female with PMH significant of diabetes, chronic kidney disease stage III, chronic pain syndrome, chronic respiratory failure secondary to COPD, multiple sclerosis, GERD, essential hypertension and primary hyperparathyroidism s/p surgery who presents to the ED with hypoglycemia by EMS.  Patient apparently started having diarrhea  at [facility].  Her blood glucose was noted to be 34 then 70 and 25.  She was given glucagon x 4. She has not received any insulin at SNF.  When patient arrived the ER she appeared drowsy, unable to give any history.  Her initial blood sugar was 46.  She was given D10 and blood sugar went to 130.  She is so lethargic ,not much history was obtained.  Patient was noted to be in acute kidney failure with creatinine more than 4.0  It was previously normal. She was found to have hyperkalemia as well as lactic acidosis which has resolved.  Suspected pyuria and severe sepsis.  Her blood pressure is however stable.  She is hypoxic but has chronically been on 2 L of oxygen.     She was admitted as a case of severe sepsis with multiple electrolyte abnormalities.  Critical care , Nephrology and Neurology was consulted. Patient continues to remain lethargic, EEG no evidence of seizures, renal function has plateaud,  no further improvement.   Palliative care consulted and guided goals of care discussions. The plan of care was transitioned to full comfort measures earlier in the hospitalization.  Patient to remain inpatient with unstable vital signs.   Subjective: Unresponsive  Objective: BP (!) 142/84 (BP Location: Right Arm)   Pulse (!) 124   Temp 98.1 F (36.7 C) (Oral)   Resp 12   Wt 77.4 kg   SpO2 (!) 83%   BMI 28.40 kg/m   Gen: Unresponsive, calm without grimace.  Pulm: Even and nonlabored. CV: No edema. Regular tachycardia.  Ext: Warm,  dry.  Assessment & Plan: Principal Problem:   Severe sepsis (HCC) Active Problems:   Type 2 diabetes mellitus with hypoglycemia without coma (HCC)   Hyperlipidemia   Multiple sclerosis (HCC)   Essential hypertension   GERD   Acute lower UTI   Acute metabolic encephalopathy   AKI (acute kidney injury) (HCC)   Hyperkalemia   Hyponatremia   Hypoglycemia   Pressure ulcer of left leg   Palliative care by specialist   Goals of care, counseling/discussion   Stercoral colitis   History of CVA (cerebrovascular accident)   Chronic pain syndrome   Microcytic anemia   Tyrone Nine, MD [facility] [email or web address] 04/07/2021, 5:48 PM

## 2021-04-07 NOTE — Care Management Important Message (Signed)
Important Message  Patient Details IM Letter given to the Patient. Name: Janice Little MRN: [id number] Date of Birth: Jul 15, 1946   Medicare Important Message Given:  Yes     Caren Macadam 04/07/2021, 9:45 AM

## 2021-04-08 ENCOUNTER — Other Ambulatory Visit: Payer: Self-pay

## 2021-05-03 NOTE — Plan of Care (Signed)
  Problem: Education: Goal: Knowledge of the prescribed therapeutic regimen will improve Outcome: Progressing   Problem: Coping: Goal: Ability to identify and develop effective coping behavior will improve Outcome: Progressing   Problem: Clinical Measurements: Goal: Quality of life will improve Outcome: Progressing   Problem: Respiratory: Goal: Verbalizations of increased ease of respirations will increase Outcome: Progressing   Problem: Pain Management: Goal: Satisfaction with pain management regimen will improve Outcome: Progressing   

## 2021-05-03 NOTE — Progress Notes (Signed)
Patient's time of death was at 02-08-19. Notified on-call provider, and called patient's sister Valerie Roys and notified of pt's death. Sister [name] stated that she will call pt's son Eston Esters and notify. Will continue to monitor. Tomasa Hose

## 2021-05-03 NOTE — Death Summary Note (Signed)
DEATH SUMMARY   Patient Details  Name: Janice Little MRN: [id number] DOB: 1946-04-01  Admission/Discharge Information   Admit Date:  03-27-21  Date of Death: Date of Death: 04-13-2021  Time of Death: Time of Death: 2119/02/13  Length of Stay: Feb 22, 2023  Referring Physician: Juluis Rainier, MD   Reason(s) for Hospitalization  Sepsis, hypoglycemia  Diagnoses  Preliminary cause of death: Severe sepsis due to UTI Secondary Diagnoses (including complications and co-morbidities):  Principal Problem:   Severe sepsis [facility]) Active Problems:   Type 2 diabetes mellitus with hypoglycemia without coma (HCC)   Hyperlipidemia   Multiple sclerosis (HCC)   Essential hypertension   GERD   Acute lower UTI   Acute metabolic encephalopathy   AKI (acute kidney injury) (HCC)   Hyperkalemia   Hyponatremia   Hypoglycemia   Pressure ulcer of left leg   Palliative care by specialist   Goals of care, counseling/discussion   Stercoral colitis   History of CVA (cerebrovascular accident)   Chronic pain syndrome   Microcytic anemia   Brief Hospital Course (including significant findings, care, treatment, and services provided and events leading to death)  Janice Little is a 75 y.o. year old female with PMH significant of diabetes, chronic kidney disease stage III, chronic pain syndrome, chronic respiratory failure secondary to COPD, multiple sclerosis, GERD, essential hypertension and primary hyperparathyroidism s/p surgery who presents to the ED with hypoglycemia by EMS.  Patient apparently started having diarrhea  at [facility].  Her blood glucose was noted to be 34 then 70 and 25.  She was given glucagon x 4. She has not received any insulin at SNF.  When patient arrived the ER she appeared drowsy, unable to give any history.  Her initial blood sugar was 46.  She was given D10 and blood sugar went to 130.  She is so lethargic ,not much history was obtained.  Patient was noted to be in acute kidney  failure with creatinine more than 4.0  It was previously normal. She was found to have hyperkalemia as well as lactic acidosis which has resolved.  Suspected pyuria and severe sepsis.  Her blood pressure is however stable.  She is hypoxic but has chronically been on 2 L of oxygen.     She was admitted as a case of severe sepsis with multiple electrolyte abnormalities.  Critical care , Nephrology and Neurology was consulted. Patient continues to remain lethargic, EEG no evidence of seizures, renal function has plateaud,  no further improvement.   Palliative care consulted and guided goals of care discussions. The plan of care was transitioned to full comfort measures earlier in the hospitalization.   Patient remained inpatient with unstable vital signs precluding residential hospice transfer and ultimately expired Apr 14, 2023.  Pertinent Labs and Studies  Significant Diagnostic Studies DG Abd 1 View  Result Date: 03/23/2021 CLINICAL DATA:  NG tube placement EXAM: ABDOMEN - 1 VIEW COMPARISON:  None. FINDINGS: NG tube in the body of the stomach with the tip directed towards the fundus. Normal bowel gas pattern. IMPRESSION: Feeding tube tip in the proximal body of the stomach. Electronically Signed   By: Marlan Palau M.D.   On: 03/23/2021 12:12   CT Head Wo Contrast  Result Date: 03/27/21 CLINICAL DATA:  Altered mental status. EXAM: CT HEAD WITHOUT CONTRAST TECHNIQUE: Contiguous axial images were obtained from the base of the skull through the vertex without intravenous contrast. COMPARISON:  01/11/2020 FINDINGS: Brain: No evidence of acute infarction, hemorrhage, hydrocephalus, extra-axial collection  or mass lesion/mass effect. Ventricular and sulcal enlargement consistent with mild diffuse atrophy. 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 Patchy areas of white matter hypoattenuation are also noted consistent with moderate to advanced chronic microvascular ischemic change. Old right thalamic and left  basal ganglia lacunar infarcts. Vascular: No hyperdense vessel or unexpected calcification. Skull: Normal. Negative for fracture or focal lesion. Sinuses/Orbits: Globes and orbits are unremarkable. Sinuses are clear. Other: None. IMPRESSION: 1. No acute intracranial abnormalities. 2. Old infarcts, moderate to advanced chronic microvascular ischemic change and mild atrophy, stable compared to the prior CT. Electronically Signed   By: Amie Portland M.D.   On: 03/28/2021 20:20   US RENAL  Result Date: 03/23/2021 CLINICAL DATA:  Acute kidney injury EXAM: RENAL / URINARY TRACT ULTRASOUND COMPLETE COMPARISON:  CT scan 03/23/2021 FINDINGS: Right Kidney: Renal measurements: 10.7 x 5.3 x 5.4 cm = volume: 160 mL. Echogenicity within normal limits. No mass or hydronephrosis visualized. Left Kidney: Renal measurements: Obscured by bowel gas. Bladder: Decompressed by Foley catheter. Other: None. IMPRESSION: Limited study due to body habitus and patient condition. No evidence for hydronephrosis in the right kidney. Left kidney could not be visualized. Electronically Signed   By: Kennith Center M.D.   On: 03/23/2021 15:20   DG CHEST PORT 1 VIEW  Result Date: 03/26/2021 CLINICAL DATA:  Aspiration EXAM: PORTABLE CHEST 1 VIEW COMPARISON:  03/25/2021 FINDINGS: Lungs are clear. No pneumothorax or pleural effusion. Nasogastric tube extends into the upper abdomen beyond the margin of the examination. Cardiac size within normal limits. Implanted loop recorder again noted. Pulmonary vascularity is normal. No acute bone abnormality. IMPRESSION: Nasogastric tube in appropriate position. No focal pulmonary infiltrate. Electronically Signed   By: Helyn Numbers MD   On: 03/26/2021 05:34   DG CHEST PORT 1 VIEW  Result Date: 03/25/2021 CLINICAL DATA:  Possible pneumonia EXAM: PORTABLE CHEST 1 VIEW COMPARISON:  03/23/2021 FINDINGS: Cardiac shadow is within normal limits. Gastric catheter is noted within the stomach. Loop recorder is  again seen. Lungs are well aerated bilaterally. Persistent vascular congestion is noted. No focal confluent infiltrate is seen. No bony abnormality is noted. IMPRESSION: Persistent vascular congestion.  No new focal abnormality is seen. Gastric catheter within the stomach. Electronically Signed   By: Alcide Clever M.D.   On: 03/25/2021 19:01   DG Chest Port 1 View  Result Date: 03/24/2021 CLINICAL DATA:  Cough. EXAM: PORTABLE CHEST 1 VIEW COMPARISON:  10/16/2020. FINDINGS: Normal heart size. Loop recorder is identified in the projection of the left heart. Bony structures appear intact. Pulmonary vascular congestion. No airspace opacities. No pleural effusion or frank pulmonary edema. IMPRESSION: Pulmonary vascular congestion. Electronically Signed   By: Signa Kell M.D.   On: 03/12/2021 19:38   EEG adult  Result Date: 03/29/2021 Charlsie Quest, MD     03/29/2021 12:17 PM Patient Name: TAKODA SIEDLECKI MRN: [id number] Epilepsy Attending: Charlsie Quest Referring Physician/Provider: Dr. Milon Dikes Date: 03/29/2021 Duration: 23.04 minutes Patient history: 75 year old female with history of MS and worsening mental status.  EEG to evaluate for seizures Level of alertness:  lethargic AEDs during EEG study: None Technical aspects: This EEG study was done with scalp electrodes positioned according to the 10-20 International system of electrode placement. Electrical activity was acquired at a sampling rate of 500Hz  and reviewed with a high frequency filter of 70Hz  and a low frequency filter of 1Hz . EEG data were recorded continuously and digitally stored. Description: No posterior dominant rhythm was  seen. EEG showed continuous generalized 3-5Hz  theta-delta slowing.  Intermittent generalized periodic discharges with triphasic morphology at 1 Hz were note. Hyperventilation and photic stimulation were not performed.   ABNORMALITY - Periodic discharges with triphasic morphology, generalized ( GPDs) - Continuous  slow, generalized IMPRESSION: This study showed generalized periodic discharges with triphasic morphology which is on the ictal-interictal continuum.  However given the morphology and frequency, it is more likely due to toxic-metabolic causes.  Additionally there is moderate diffuse to severe diffuse encephalopathy, nonspecific etiology. No seizures were seen throughout the recording. Charlsie Quest   EEG adult  Result Date: 03/26/2021 Charlsie Quest, MD     03/26/2021  5:22 PM Patient Name: CHARNISHA FERREYRA MRN: [id number] Epilepsy Attending: Charlsie Quest Referring Physician/Provider: Dr Erick Blinks Date: 03/26/2021 Duration: 50.14 mins Patient history: 75 year old female with history of MS and worsening mental status.  EEG to evaluate for seizures. Level of alertness:  comatose AEDs during EEG study: None Technical aspects: This EEG was obtained using a 10 lead EEG system positioned circumferentially without any parasagittal coverage (rapid EEG). Computer selected EEG is reviewed as well as background features and all clinically significant events. Description: No posterior dominant rhythm was seen.  EEG showed continuous generalized and lateralized right hemisphere 3 to 5 Hz theta-delta slowing.  Hyperventilation and photic stimulation were not performed.   ABNORMALITY - Continuous slow, generalized and lateralized right hemisphere IMPRESSION: This study is suggestive of cortical dysfunction in right hemisphere likely secondary to underlying structural abnormality/stroke.  Additionally there is evidence of moderate degree of encephalopathy, nonspecific etiology.  No seizures or epileptiform discharges were seen throughout the recording.  If suspicion for interictal activity remains a concern, a prolonged study with traditional eeg can be considered. Charlsie Quest   ECHOCARDIOGRAM COMPLETE  Result Date: 03/24/2021    ECHOCARDIOGRAM REPORT   Patient Name:   JAQUILLA BASILIO Date of Exam:  03/24/2021 Medical Rec #:  [id number]          Height:       65.0 in Accession #:    [id number]         Weight:       171.1 lb Date of Birth:  June 18, 1946          BSA:          1.851 m Patient Age:    74 years           BP:           110/66 mmHg Patient Gender: F                  HR:           99 bpm. Exam Location:  Inpatient Procedure: 2D Echo, 3D Echo, Cardiac Doppler, Color Doppler and Strain Analysis Indications:    CHF-Acute Diastolic I50.31  History:        Patient has prior history of Echocardiogram examinations, most                 recent 03/01/2019. Risk Factors:Diabetes and Hypertension.                 Cognitive impairment, chronic respiratory failure.  Sonographer:    Leta Jungling RDCS Referring Phys: [id number] Lowella Petties DANIELS IMPRESSIONS  1. Left ventricular ejection fraction, by estimation, is 70 to 75%. The left ventricle has hyperdynamic function. The left ventricle has no regional wall motion abnormalities. There is moderate asymmetric left ventricular hypertrophy. Left  ventricular diastolic parameters are consistent with Grade I diastolic dysfunction (impaired relaxation). Elevated left ventricular end-diastolic pressure. The average left ventricular global longitudinal strain is -19.5 %. The global longitudinal strain is normal.  2. Right ventricular systolic function is normal. The right ventricular size is normal.  3. The mitral valve is normal in structure. No evidence of mitral valve regurgitation. No evidence of mitral stenosis.  4. The aortic valve is tricuspid. There is mild calcification of the aortic valve. Aortic valve regurgitation is not visualized. No aortic stenosis is present.  5. The inferior vena cava is normal in size with greater than 50% respiratory variability, suggesting right atrial pressure of 3 mmHg. FINDINGS  Left Ventricle: Left ventricular ejection fraction, by estimation, is 70 to 75%. The left ventricle has hyperdynamic function. The left ventricle has no regional wall  motion abnormalities. The average left ventricular global longitudinal strain is -19.5  %. The global longitudinal strain is normal. The left ventricular internal cavity size was normal in size. There is moderate asymmetric left ventricular hypertrophy. Left ventricular diastolic parameters are consistent with Grade I diastolic dysfunction  (impaired relaxation). Elevated left ventricular end-diastolic pressure. Right Ventricle: The right ventricular size is normal. No increase in right ventricular wall thickness. Right ventricular systolic function is normal. Left Atrium: Left atrial size was normal in size. Right Atrium: Right atrial size was normal in size. Pericardium: There is no evidence of pericardial effusion. Mitral Valve: The mitral valve is normal in structure. Mild mitral annular calcification. No evidence of mitral valve regurgitation. No evidence of mitral valve stenosis. MV peak gradient, 11.7 mmHg. The mean mitral valve gradient is 5.0 mmHg. Tricuspid Valve: The tricuspid valve is normal in structure. Tricuspid valve regurgitation is trivial. No evidence of tricuspid stenosis. Aortic Valve: The aortic valve is tricuspid. There is mild calcification of the aortic valve. Aortic valve regurgitation is not visualized. No aortic stenosis is present. Pulmonic Valve: The pulmonic valve was normal in structure. Pulmonic valve regurgitation is not visualized. No evidence of pulmonic stenosis. Aorta: The aortic root is normal in size and structure. Venous: The inferior vena cava is normal in size with greater than 50% respiratory variability, suggesting right atrial pressure of 3 mmHg. IAS/Shunts: No atrial level shunt detected by color flow Doppler.  LEFT VENTRICLE PLAX 2D LVIDd:         4.40 cm  Diastology LVIDs:         2.30 cm  LV e' medial:    6.65 cm/s LV PW:         0.90 cm  LV E/e' medial:  17.0 LV IVS:        0.90 cm  LV e' lateral:   9.28 cm/s LVOT diam:     1.90 cm  LV E/e' lateral: 12.2 LV SV:          45 LV SV Index:   24       2D Longitudinal Strain LVOT Area:     2.84 cm 2D Strain GLS (A2C):   -18.8 %                         2D Strain GLS (A3C):   -20.9 %                         2D Strain GLS (A4C):   -18.9 %  2D Strain GLS Avg:     -19.5 % RIGHT VENTRICLE RV S prime:     24.90 cm/s TAPSE (M-mode): 1.4 cm LEFT ATRIUM             Index       RIGHT ATRIUM           Index LA diam:        3.30 cm 1.78 cm/m  RA Area:     10.40 cm LA Vol (A2C):   31.0 ml 16.75 ml/m RA Volume:   18.30 ml  9.89 ml/m LA Vol (A4C):   26.2 ml 14.15 ml/m LA Biplane Vol: 28.8 ml 15.56 ml/m  AORTIC VALVE LVOT Vmax:   111.00 cm/s LVOT Vmean:  67.100 cm/s LVOT VTI:    0.158 m  AORTA Ao Root diam: 2.60 cm Ao Asc diam:  2.90 cm MITRAL VALVE                TRICUSPID VALVE MV Area (PHT): 3.60 cm     TR Peak grad:   23.2 mmHg MV Area VTI:   1.40 cm     TR Vmax:        241.00 cm/s MV Peak grad:  11.7 mmHg MV Mean grad:  5.0 mmHg     SHUNTS MV Vmax:       1.71 m/s     Systemic VTI:  0.16 m MV Vmean:      106.3 cm/s   Systemic Diam: 1.90 cm MV Decel Time: 211 msec MV E velocity: 113.00 cm/s MV A velocity: 176.40 cm/s MV E/A ratio:  0.64 Chilton Siiffany [place] MD Electronically signed by Chilton Siiffany [place] MD Signature Date/Time: 03/24/2021/4:05:45 PM    Final    CT CHEST ABDOMEN PELVIS WO CONTRAST  Result Date: 03/23/2021 CLINICAL DATA:  Recurrent complicated UTI, severe sepsis, electrolyte abnormalities, history of type II diabetes mellitus, fatty liver, hypertension, multiple sclerosis EXAM: CT CHEST, ABDOMEN AND PELVIS WITHOUT CONTRAST TECHNIQUE: Multidetector CT imaging of the chest, abdomen and pelvis was performed following the standard protocol without IV contrast. Sagittal and coronal MPR images reconstructed from axial data set. COMPARISON:  CT chest 10/17/2020, CT abdomen and pelvis 09/16/2019 FINDINGS: CT CHEST FINDINGS Cardiovascular: Atherosclerotic calcifications aorta and proximal great vessels. Aorta  normal caliber. Heart unremarkable. Mediastinum/Nodes: Nasogastric tube traverses esophagus. No thoracic adenopathy. Base of cervical region normal appearance. Lungs/Pleura: Scattered respiratory motion artifacts. Bibasilar atelectasis greater on RIGHT. Tiny BILATERAL pleural effusions. Central peribronchial thickening. Minimal hazy infiltrate in the upper lungs. Questionable nodular density LEFT lower lobe 7 mm diameter image 77 not definitely seen previously but patient had more extensive infiltrates on the previous exam. Musculoskeletal: Scoliosis. CT ABDOMEN PELVIS FINDINGS Hepatobiliary: Vague gallstones within gallbladder, better visualized on prior exam. Streak artifacts from patient's arms traverse liver. No focal hepatic mass lesion. Pancreas: Atrophic pancreas Spleen: Normal appearance.  Small splenule. Adrenals/Urinary Tract: Adrenal glands, kidneys and ureters normal appearance. Bladder decompressed by Foley catheter. Stomach/Bowel: Normal appendix. Scattered colonic diverticulosis without evidence of diverticulitis. Increased stool in rectum with rectal wall thickening question stercoral colitis. Nasogastric tube in stomach. Stomach and bowel loops otherwise unremarkable. Vascular/Lymphatic: Atherosclerotic calcifications aorta without aneurysm. No adenopathy. Reproductive: Uterus surgically absent with nonvisualization of ovaries Other: No free air or free fluid. Scattered subcutaneous edema greatest at LEFT flank. Musculoskeletal: Osseous demineralization. Superior endplate compression deformities of L1 and L2 vertebral bodies, new. IMPRESSION: Increased stool in rectum with rectal wall thickening question stercoral colitis. Tiny BILATERAL pleural effusions with bibasilar  atelectasis greater on RIGHT. Cholelithiasis. Colonic diverticulosis without evidence of diverticulitis. Superior endplate compression deformities of L1 and L2 vertebral bodies, new. Improved pulmonary infiltrates with persistent  tiny pleural effusions and bibasilar atelectasis. Aortic Atherosclerosis (ICD10-I70.0). Electronically Signed   By: Ulyses Southward M.D.   On: 03/23/2021 12:35    Procedures/Operations  None   Tyrone Nine, MD 04/09/2021, 12:53 PM

## 2021-05-03 NOTE — Progress Notes (Signed)
BRIEF OVERNIGHT PROGRESS REPORT  Notified by RN that patient has expired at 2120 Patient was DNR with Palliative care consult 2 RN verified. Family was not immediately available to RN.  [name] Janice Little was notified by nursing staff of patient death. Patient's son Janice Little will be notified by Janice Little per nursing staff.    Janice Little MSNA ACNPC-AG Acute Care Nurse Practitioner [facility]

## 2021-05-03 NOTE — Progress Notes (Signed)
[facility] PROGRESS NOTE  Janice Little  YPP:[id number] DOB: 1946-07-18 DOA: 2021-04-14  Brief Narrative: This 75 y.o. female with PMH significant of diabetes, chronic kidney disease stage III, chronic pain syndrome, chronic respiratory failure secondary to COPD, multiple sclerosis, GERD, essential hypertension and primary hyperparathyroidism s/p surgery who presents to the ED with hypoglycemia by EMS.  Patient apparently started having diarrhea  at [facility].  Her blood glucose was noted to be 34 then 70 and 25.  She was given glucagon x 4. She has not received any insulin at SNF.  When patient arrived the ER she appeared drowsy, unable to give any history.  Her initial blood sugar was 46.  She was given D10 and blood sugar went to 130.  She is so lethargic ,not much history was obtained.  Patient was noted to be in acute kidney failure with creatinine more than 4.0  It was previously normal. She was found to have hyperkalemia as well as lactic acidosis which has resolved.  Suspected pyuria and severe sepsis.  Her blood pressure is however stable.  She is hypoxic but has chronically been on 2 L of oxygen.     She was admitted as a case of severe sepsis with multiple electrolyte abnormalities.  Critical care , Nephrology and Neurology was consulted. Patient continues to remain lethargic, EEG no evidence of seizures, renal function has plateaud,  no further improvement.   Palliative care consulted and guided goals of care discussions. The plan of care was transitioned to full comfort measures earlier in the hospitalization.  Patient to remain inpatient with unstable vital signs.   Subjective: Unresponsive  Objective: BP (!) 142/84 (BP Location: Right Arm)   Pulse (!) 124   Temp 98.1 F (36.7 C) (Oral)   Resp 12   Ht 5\' 5"  (1.651 m)   Wt 77.4 kg   SpO2 (!) 83%   BMI 28.40 kg/m   Gen: Unresponsive Pulm: Agonal  Assessment & Plan: Principal Problem:   Severe sepsis  (HCC) Active Problems:   Type 2 diabetes mellitus with hypoglycemia without coma (HCC)   Hyperlipidemia   Multiple sclerosis (HCC)   Essential hypertension   GERD   Acute lower UTI   Acute metabolic encephalopathy   AKI (acute kidney injury) (HCC)   Hyperkalemia   Hyponatremia   Hypoglycemia   Pressure ulcer of left leg   Palliative care by specialist   Goals of care, counseling/discussion   Stercoral colitis   History of CVA (cerebrovascular accident)   Chronic pain syndrome   Microcytic anemia   , MD [facility] [email or web address] 04/20/2021, 4:03 PM

## 2021-05-03 DEATH — deceased
